# Patient Record
Sex: Female | Born: 1955
Health system: Southern US, Community
[De-identification: ages and names within clinical notes are randomized; demographics above are authoritative.]

## PROBLEM LIST (undated history)

## (undated) DIAGNOSIS — R3129 Other microscopic hematuria: Secondary | ICD-10-CM

## (undated) DIAGNOSIS — M1990 Unspecified osteoarthritis, unspecified site: Secondary | ICD-10-CM

## (undated) DIAGNOSIS — G629 Polyneuropathy, unspecified: Secondary | ICD-10-CM

## (undated) DIAGNOSIS — G5603 Carpal tunnel syndrome, bilateral upper limbs: Secondary | ICD-10-CM

## (undated) DIAGNOSIS — I8002 Phlebitis and thrombophlebitis of superficial vessels of left lower extremity: Secondary | ICD-10-CM

## (undated) DIAGNOSIS — R200 Anesthesia of skin: Secondary | ICD-10-CM

## (undated) DIAGNOSIS — T8859XA Other complications of anesthesia, initial encounter: Secondary | ICD-10-CM

## (undated) DIAGNOSIS — G4730 Sleep apnea, unspecified: Secondary | ICD-10-CM

## (undated) DIAGNOSIS — T4145XA Adverse effect of unspecified anesthetic, initial encounter: Secondary | ICD-10-CM

## (undated) DIAGNOSIS — IMO0002 Reserved for concepts with insufficient information to code with codable children: Secondary | ICD-10-CM

## (undated) DIAGNOSIS — I10 Essential (primary) hypertension: Secondary | ICD-10-CM

## (undated) DIAGNOSIS — E039 Hypothyroidism, unspecified: Secondary | ICD-10-CM

## (undated) DIAGNOSIS — Z8709 Personal history of other diseases of the respiratory system: Secondary | ICD-10-CM

## (undated) DIAGNOSIS — Z9889 Other specified postprocedural states: Secondary | ICD-10-CM

## (undated) DIAGNOSIS — R112 Nausea with vomiting, unspecified: Secondary | ICD-10-CM

## (undated) DIAGNOSIS — E878 Other disorders of electrolyte and fluid balance, not elsewhere classified: Secondary | ICD-10-CM

## (undated) DIAGNOSIS — M797 Fibromyalgia: Secondary | ICD-10-CM

## (undated) DIAGNOSIS — R42 Dizziness and giddiness: Secondary | ICD-10-CM

## (undated) DIAGNOSIS — R7303 Prediabetes: Secondary | ICD-10-CM

## (undated) DIAGNOSIS — J310 Chronic rhinitis: Secondary | ICD-10-CM

## (undated) DIAGNOSIS — E8881 Metabolic syndrome: Secondary | ICD-10-CM

## (undated) DIAGNOSIS — Z86711 Personal history of pulmonary embolism: Secondary | ICD-10-CM

## (undated) DIAGNOSIS — J45909 Unspecified asthma, uncomplicated: Secondary | ICD-10-CM

## (undated) DIAGNOSIS — N200 Calculus of kidney: Secondary | ICD-10-CM

## (undated) DIAGNOSIS — R351 Nocturia: Secondary | ICD-10-CM

## (undated) DIAGNOSIS — K219 Gastro-esophageal reflux disease without esophagitis: Secondary | ICD-10-CM

## (undated) DIAGNOSIS — R202 Paresthesia of skin: Secondary | ICD-10-CM

## (undated) DIAGNOSIS — R32 Unspecified urinary incontinence: Secondary | ICD-10-CM

## (undated) DIAGNOSIS — Z87442 Personal history of urinary calculi: Secondary | ICD-10-CM

## (undated) DIAGNOSIS — E88819 Insulin resistance, unspecified: Secondary | ICD-10-CM

## (undated) DIAGNOSIS — N3946 Mixed incontinence: Secondary | ICD-10-CM

## (undated) DIAGNOSIS — R011 Cardiac murmur, unspecified: Secondary | ICD-10-CM

## (undated) DIAGNOSIS — M25519 Pain in unspecified shoulder: Secondary | ICD-10-CM

## (undated) DIAGNOSIS — M4800 Spinal stenosis, site unspecified: Secondary | ICD-10-CM

## (undated) DIAGNOSIS — N3281 Overactive bladder: Secondary | ICD-10-CM

## (undated) DIAGNOSIS — I872 Venous insufficiency (chronic) (peripheral): Secondary | ICD-10-CM

## (undated) DIAGNOSIS — E119 Type 2 diabetes mellitus without complications: Secondary | ICD-10-CM

## (undated) DIAGNOSIS — R3915 Urgency of urination: Secondary | ICD-10-CM

## (undated) DIAGNOSIS — G4700 Insomnia, unspecified: Secondary | ICD-10-CM

## (undated) DIAGNOSIS — Z87898 Personal history of other specified conditions: Secondary | ICD-10-CM

## (undated) DIAGNOSIS — R0602 Shortness of breath: Secondary | ICD-10-CM

## (undated) DIAGNOSIS — J189 Pneumonia, unspecified organism: Secondary | ICD-10-CM

## (undated) DIAGNOSIS — E662 Morbid (severe) obesity with alveolar hypoventilation: Secondary | ICD-10-CM

## (undated) HISTORY — DX: Personal history of pulmonary embolism: Z86.711

## (undated) HISTORY — DX: Shortness of breath: R06.02

## (undated) HISTORY — DX: Overactive bladder: N32.81

## (undated) HISTORY — DX: Chronic rhinitis: J31.0

## (undated) HISTORY — PX: ENDOMETRIAL ABLATION: SHX621

## (undated) HISTORY — DX: Mixed incontinence: N39.46

## (undated) HISTORY — DX: Morbid (severe) obesity with alveolar hypoventilation: E66.2

## (undated) HISTORY — DX: Cardiac murmur, unspecified: R01.1

## (undated) HISTORY — PX: HERNIA REPAIR: SHX51

## (undated) HISTORY — DX: Polyneuropathy, unspecified: G62.9

## (undated) HISTORY — DX: Insulin resistance, unspecified: E88.819

## (undated) HISTORY — PX: EXTRACORPOREAL SHOCK WAVE LITHOTRIPSY: SHX1557

## (undated) HISTORY — DX: Spinal stenosis, site unspecified: M48.00

## (undated) HISTORY — DX: Dizziness and giddiness: R42

## (undated) HISTORY — DX: Other disorders of electrolyte and fluid balance, not elsewhere classified: E87.8

## (undated) HISTORY — DX: Morbid (severe) obesity due to excess calories: E66.01

## (undated) HISTORY — DX: Insomnia, unspecified: G47.00

## (undated) HISTORY — DX: Carpal tunnel syndrome, bilateral upper limbs: G56.03

## (undated) HISTORY — DX: Venous insufficiency (chronic) (peripheral): I87.2

## (undated) HISTORY — DX: Hypothyroidism, unspecified: E03.9

## (undated) HISTORY — DX: Other microscopic hematuria: R31.29

## (undated) HISTORY — DX: Fibromyalgia: M79.7

## (undated) HISTORY — DX: Essential (primary) hypertension: I10

## (undated) HISTORY — DX: Metabolic syndrome: E88.81

## (undated) HISTORY — DX: Calculus of kidney: N20.0

## (undated) HISTORY — PX: GASTRIC RESTRICTION SURGERY: SHX653

## (undated) HISTORY — DX: Reserved for concepts with insufficient information to code with codable children: IMO0002

## (undated) HISTORY — PX: OTHER SURGICAL HISTORY: SHX169

## (undated) HISTORY — DX: Phlebitis and thrombophlebitis of superficial vessels of left lower extremity: I80.02

## (undated) HISTORY — DX: Gastro-esophageal reflux disease without esophagitis: K21.9

## (undated) HISTORY — DX: Unspecified osteoarthritis, unspecified site: M19.90

---

## 1983-04-01 HISTORY — PX: GASTRIC RESTRICTION SURGERY: SHX653

## 1985-03-31 HISTORY — PX: CHOLECYSTECTOMY: SHX55

## 1997-12-01 ENCOUNTER — Ambulatory Visit (HOSPITAL_COMMUNITY): Admission: RE | Admit: 1997-12-01 | Discharge: 1997-12-01 | Payer: Self-pay | Admitting: Family Medicine

## 1998-01-25 ENCOUNTER — Encounter: Payer: Self-pay | Admitting: Emergency Medicine

## 1998-01-25 ENCOUNTER — Observation Stay (HOSPITAL_COMMUNITY): Admission: EM | Admit: 1998-01-25 | Discharge: 1998-01-26 | Payer: Self-pay | Admitting: Emergency Medicine

## 1998-01-25 ENCOUNTER — Encounter: Payer: Self-pay | Admitting: Urology

## 1998-01-25 ENCOUNTER — Ambulatory Visit (HOSPITAL_COMMUNITY): Admission: RE | Admit: 1998-01-25 | Discharge: 1998-01-25 | Payer: Self-pay | Admitting: Urology

## 1998-01-26 ENCOUNTER — Encounter: Payer: Self-pay | Admitting: Urology

## 1998-03-31 HISTORY — PX: FOOT SURGERY: SHX648

## 1999-02-05 ENCOUNTER — Ambulatory Visit (HOSPITAL_BASED_OUTPATIENT_CLINIC_OR_DEPARTMENT_OTHER): Admission: RE | Admit: 1999-02-05 | Discharge: 1999-02-05 | Payer: Self-pay | Admitting: Orthopedic Surgery

## 1999-04-03 ENCOUNTER — Encounter: Admission: RE | Admit: 1999-04-03 | Discharge: 1999-04-03 | Payer: Self-pay | Admitting: Gynecology

## 1999-04-03 ENCOUNTER — Encounter: Payer: Self-pay | Admitting: Gynecology

## 1999-04-10 ENCOUNTER — Encounter: Payer: Self-pay | Admitting: Gynecology

## 1999-04-10 ENCOUNTER — Encounter: Admission: RE | Admit: 1999-04-10 | Discharge: 1999-04-10 | Payer: Self-pay | Admitting: Gynecology

## 1999-12-05 ENCOUNTER — Encounter (INDEPENDENT_AMBULATORY_CARE_PROVIDER_SITE_OTHER): Payer: Self-pay | Admitting: Specialist

## 1999-12-05 ENCOUNTER — Ambulatory Visit (HOSPITAL_COMMUNITY): Admission: RE | Admit: 1999-12-05 | Discharge: 1999-12-05 | Payer: Self-pay | Admitting: Gynecology

## 2000-04-10 ENCOUNTER — Encounter: Payer: Self-pay | Admitting: Gynecology

## 2000-04-10 ENCOUNTER — Encounter: Admission: RE | Admit: 2000-04-10 | Discharge: 2000-04-10 | Payer: Self-pay | Admitting: Gynecology

## 2000-05-01 ENCOUNTER — Encounter: Admission: RE | Admit: 2000-05-01 | Discharge: 2000-05-01 | Payer: Self-pay | Admitting: Endocrinology

## 2000-05-01 ENCOUNTER — Encounter: Payer: Self-pay | Admitting: Endocrinology

## 2000-05-13 ENCOUNTER — Encounter: Admission: RE | Admit: 2000-05-13 | Discharge: 2000-05-13 | Payer: Self-pay | Admitting: Endocrinology

## 2000-05-13 ENCOUNTER — Encounter: Payer: Self-pay | Admitting: Endocrinology

## 2000-08-19 ENCOUNTER — Encounter: Admission: RE | Admit: 2000-08-19 | Discharge: 2000-08-19 | Payer: Self-pay | Admitting: Orthopedic Surgery

## 2000-08-19 ENCOUNTER — Encounter: Payer: Self-pay | Admitting: Specialist

## 2000-08-20 ENCOUNTER — Other Ambulatory Visit: Admission: RE | Admit: 2000-08-20 | Discharge: 2000-08-20 | Payer: Self-pay | Admitting: Gynecology

## 2000-10-12 ENCOUNTER — Encounter: Payer: Self-pay | Admitting: Specialist

## 2000-10-12 ENCOUNTER — Encounter: Admission: RE | Admit: 2000-10-12 | Discharge: 2000-10-12 | Payer: Self-pay | Admitting: Specialist

## 2001-04-13 ENCOUNTER — Encounter: Admission: RE | Admit: 2001-04-13 | Discharge: 2001-04-13 | Payer: Self-pay | Admitting: Gynecology

## 2001-04-13 ENCOUNTER — Encounter: Payer: Self-pay | Admitting: Gynecology

## 2001-11-01 ENCOUNTER — Encounter: Payer: Self-pay | Admitting: Specialist

## 2001-11-01 ENCOUNTER — Encounter: Admission: RE | Admit: 2001-11-01 | Discharge: 2001-11-01 | Payer: Self-pay | Admitting: Specialist

## 2001-11-15 ENCOUNTER — Encounter: Admission: RE | Admit: 2001-11-15 | Discharge: 2001-11-15 | Payer: Self-pay | Admitting: Specialist

## 2001-11-15 ENCOUNTER — Encounter: Payer: Self-pay | Admitting: Specialist

## 2007-04-01 HISTORY — PX: JOINT REPLACEMENT: SHX530

## 2007-04-01 LAB — HM MAMMOGRAPHY: HM Mammogram: NORMAL

## 2008-03-31 ENCOUNTER — Encounter: Payer: Self-pay | Admitting: Family Medicine

## 2008-03-31 LAB — CONVERTED CEMR LAB: Pap Smear: NORMAL

## 2009-07-17 ENCOUNTER — Encounter: Payer: Self-pay | Admitting: Family Medicine

## 2009-09-06 ENCOUNTER — Encounter: Payer: Self-pay | Admitting: Family Medicine

## 2010-02-19 ENCOUNTER — Encounter: Payer: Self-pay | Admitting: Family Medicine

## 2010-02-20 ENCOUNTER — Encounter: Payer: Self-pay | Admitting: Family Medicine

## 2010-03-28 ENCOUNTER — Encounter: Payer: Self-pay | Admitting: Family Medicine

## 2010-04-11 ENCOUNTER — Encounter: Payer: Self-pay | Admitting: Family Medicine

## 2010-04-12 ENCOUNTER — Encounter: Payer: Self-pay | Admitting: Family Medicine

## 2010-04-12 ENCOUNTER — Ambulatory Visit
Admission: RE | Admit: 2010-04-12 | Discharge: 2010-04-12 | Payer: Self-pay | Source: Home / Self Care | Attending: Family Medicine | Admitting: Family Medicine

## 2010-04-12 DIAGNOSIS — E8881 Metabolic syndrome: Secondary | ICD-10-CM | POA: Insufficient documentation

## 2010-04-12 DIAGNOSIS — G4733 Obstructive sleep apnea (adult) (pediatric): Secondary | ICD-10-CM | POA: Insufficient documentation

## 2010-04-12 DIAGNOSIS — R42 Dizziness and giddiness: Secondary | ICD-10-CM

## 2010-04-12 DIAGNOSIS — I10 Essential (primary) hypertension: Secondary | ICD-10-CM | POA: Insufficient documentation

## 2010-04-12 DIAGNOSIS — E039 Hypothyroidism, unspecified: Secondary | ICD-10-CM | POA: Insufficient documentation

## 2010-04-12 HISTORY — DX: Dizziness and giddiness: R42

## 2010-04-15 ENCOUNTER — Encounter: Payer: Self-pay | Admitting: Family Medicine

## 2010-04-15 ENCOUNTER — Telehealth (INDEPENDENT_AMBULATORY_CARE_PROVIDER_SITE_OTHER): Payer: Self-pay | Admitting: *Deleted

## 2010-04-16 ENCOUNTER — Ambulatory Visit: Admit: 2010-04-16 | Payer: Self-pay | Admitting: Family Medicine

## 2010-04-17 ENCOUNTER — Other Ambulatory Visit: Payer: Self-pay | Admitting: Family Medicine

## 2010-04-17 ENCOUNTER — Telehealth (INDEPENDENT_AMBULATORY_CARE_PROVIDER_SITE_OTHER): Payer: Self-pay | Admitting: *Deleted

## 2010-04-17 LAB — LIPID PANEL
Cholesterol: 206 mg/dL — ABNORMAL HIGH (ref 0–200)
HDL: 79.8 mg/dL (ref >39.00)
Total CHOL/HDL Ratio: 3
Triglycerides: 112 mg/dL (ref 0.0–149.0)
VLDL: 22.4 mg/dL (ref 0.0–40.0)

## 2010-04-17 LAB — HEMOGLOBIN A1C: Hgb A1c MFr Bld: 6.1 % (ref 4.6–6.5)

## 2010-04-17 LAB — SEDIMENTATION RATE: Sed Rate: 15 mm/hr (ref 0–22)

## 2010-04-17 LAB — LDL CHOLESTEROL, DIRECT: Direct LDL: 104.9 mg/dL

## 2010-04-18 ENCOUNTER — Telehealth (INDEPENDENT_AMBULATORY_CARE_PROVIDER_SITE_OTHER): Payer: Self-pay | Admitting: *Deleted

## 2010-04-18 LAB — BASIC METABOLIC PANEL
BUN: 16 mg/dL (ref 6–23)
CO2: 26 mEq/L (ref 19–32)
Calcium: 9.6 mg/dL (ref 8.4–10.5)
Chloride: 102 mEq/L (ref 96–112)
Creatinine, Ser: 0.6 mg/dL (ref 0.4–1.2)
GFR: 107.97 mL/min (ref >60.00)
Glucose, Bld: 103 mg/dL — ABNORMAL HIGH (ref 70–99)
Potassium: 4.2 mEq/L (ref 3.5–5.1)
Sodium: 139 mEq/L (ref 135–145)

## 2010-04-19 ENCOUNTER — Encounter: Admission: RE | Admit: 2010-04-19 | Payer: Self-pay | Source: Home / Self Care | Admitting: Family Medicine

## 2010-04-19 ENCOUNTER — Telehealth: Payer: Self-pay | Admitting: Family Medicine

## 2010-04-20 ENCOUNTER — Other Ambulatory Visit: Payer: Self-pay | Admitting: Orthopedic Surgery

## 2010-04-20 DIAGNOSIS — M545 Low back pain, unspecified: Secondary | ICD-10-CM

## 2010-04-23 ENCOUNTER — Telehealth: Payer: Self-pay | Admitting: Family Medicine

## 2010-04-30 ENCOUNTER — Telehealth: Payer: Self-pay | Admitting: Family Medicine

## 2010-04-30 ENCOUNTER — Encounter: Payer: Self-pay | Admitting: Family Medicine

## 2010-05-01 ENCOUNTER — Encounter: Payer: Self-pay | Admitting: Family Medicine

## 2010-05-02 NOTE — Miscellaneous (Signed)
  Clinical Lists Changes  Medications: Added new medication of SYNTHROID 175 MCG TABS (LEVOTHYROXINE SODIUM) 1 tab by mouth qd Added new medication of LISINOPRIL 40 MG TABS (LISINOPRIL) 1 tab by mouth qd Added new medication of ZOLPIDEM TARTRATE 10 MG TABS (ZOLPIDEM TARTRATE) 1 tab by mouth at bedtime as needed Added new medication of METFORMIN HCL 500 MG TABS (METFORMIN HCL) 1 tab by mouth bid Added new medication of HYDROCODONE-ACETAMINOPHEN 2.5-500 MG TABS (HYDROCODONE-ACETAMINOPHEN) 1 tab by mouth q6h as needed for pain Observations: Added new observation of REGULAREXERC: no (04/11/2010 16:38) Added new observation of SOCIAL HX: Former smoker. Social ETOH Regular exercise-no  (04/11/2010 16:38) Added new observation of FAMILY HX: Mother: HTN Father: CVA GPs: CAD,arthritis,thyroid disorder. (04/11/2010 16:38) Added new observation of PAST SURG HX: Cholecystectomy Foot surgery Inguinal herniorrhaphy Lithrotripsy (04/11/2010 16:38) Added new observation of PAST MED HX: DM 2, non insulin-requiring HTN Hypothyroidism (TSH 02/19/10= 1.14) Osteoarthritis GERD Insomnia Morbid obesity (BMI 49) Functional heart murmur (w/u done) (04/11/2010 16:38)       Past History:  Past Medical History: DM 2, non insulin-requiring HTN Hypothyroidism (TSH 02/19/10= 1.14) Osteoarthritis GERD Insomnia Morbid obesity (BMI 49) Functional heart murmur (w/u done)  Past Surgical History: Cholecystectomy Foot surgery Inguinal herniorrhaphy Lithrotripsy   Family History: Mother: HTN Father: CVA GPs: CAD,arthritis,thyroid disorder.  Social History: Former smoker. Social ETOH Regular exercise-no Does Patient Exercise:  no

## 2010-05-02 NOTE — Progress Notes (Signed)
Summary: Medical Records Request  Phone Note Other Incoming   Summary of Call: Pls request records from Dr. Renae Fickle (orthopedist in [place]) and from Dr. Elmon Kirschner at [facility] in [place]. [place], [place].    Initial call taken by: Michell Heinrich M.D.,  April 19, 2010 8:29 AM  Follow-up for Phone Call         Phone [phone]       Fax     [phone] Information faxed after patient signed release of medical records . Follow-up by: Georga Bora,  April 23, 2010 1:50 PM

## 2010-05-02 NOTE — Progress Notes (Signed)
Summary: Add-on labs  Phone Note Other Incoming   Summary of Call: Please add vitamin B12 level and CBC w/diff to recent labs for Tricia Perry (Dx is ataxia and weakness).  Thx. Initial call taken by: Michell Heinrich M.D.,  April 19, 2010 8:52 AM  Follow-up for Phone Call        request faxed to lab Follow-up by: Francee Piccolo CMA Duncan Dull),  April 19, 2010 9:53 AM  Additional Follow-up for Phone Call Additional follow up Details #1::        PC from Tonya at [facility], blood sample is too old to do these test. Additional Follow-up by: Francee Piccolo CMA Duncan Dull),  April 19, 2010 11:52 AM

## 2010-05-02 NOTE — Miscellaneous (Signed)
  Clinical Lists Changes  Allergies: Added new allergy or adverse reaction of CODEINE Added new allergy or adverse reaction of MORPHINE Observations: Added new observation of NKA: F (04/12/2010 11:31)

## 2010-05-02 NOTE — Medication Information (Signed)
Summary: [facility]  [facility]   Imported By: Lester [place] 04/19/2010 08:36:24  _____________________________________________________________________  External Attachment:    Type:   Image     Comment:   External Document

## 2010-05-02 NOTE — Letter (Signed)
Summary: [facility]   Imported By: Lester [place] 04/19/2010 08:44:47  _____________________________________________________________________  External Attachment:    Type:   Image     Comment:   External Document

## 2010-05-02 NOTE — Progress Notes (Signed)
Summary: Congestion medication  Phone Note Call from Patient   Summary of Call: RC from pt.  Notified of appt's that [place] has scheduled for her.  Pt also states that she picked up Mucinex over the weekend and it is not helping her.  She is still having a dry, deep, unproductive cough.  Pt would like something called in to pharmacy. I asked pt if she had been checking her sugars prior to breakfast and if so, how they looked.  Pt states she is checking her sugars and "they have leveled out".  Please adivse re: congestion meds. Initial call taken by: Francee Piccolo CMA Duncan Dull),  April 15, 2010 12:10 PM  Follow-up for Phone Call        Please communicate to her that her respiratory symptoms will still take some time to get better--perhaps another 5-7 days.  The only additional med to offer at this time is a cough med like hydrocodone.   May call in hycodan suspension, 1 tsp q6h as needed for cold/cough, 4 oz, no RF. Follow-up by: Michell Heinrich M.D.,  April 15, 2010 1:22 PM  Additional Follow-up for Phone Call Additional follow up Details #1::        LM to [facility] at home number Francee Piccolo CMA (AAMA)  April 15, 2010 3:02 PM  RC from pt.  Reviewed above information with pt.  Also asked pt several questions regarding mammo and MRI appts.  Pt would like hydrocodone syrup called in.  Advised pt she needs to have kidney function tested prior to MRI, she will go to [place] lab this week for her labs.  BUN and creatinine added. Additional Follow-up by: Francee Piccolo CMA Duncan Dull),  April 15, 2010 3:44 PM    New/Updated Medications: * HYCODAN SUSPENSION 1 tsp q6h as needed for cold/cough Prescriptions: HYCODAN SUSPENSION 1 tsp q6h as needed for cold/cough  #4 oz x o   Entered by:   Francee Piccolo CMA (AAMA)   Authorized by:   Michell Heinrich M.D.   Signed by:   Francee Piccolo CMA (AAMA) on 04/15/2010   Method used:   Faxed to ...       [facility] [address]*  (retail)       [address] [place], [place]  [address]       Ph: [phone]       Fax: [phone]   RxID:   [id number]

## 2010-05-02 NOTE — Progress Notes (Signed)
Summary: Lab results  Phone Note Other Incoming   Summary of Call: Please notify: labs normal.  Continue current meds. Initial call taken by: Michell Heinrich M.D.,  April 18, 2010 9:30 AM  Follow-up for Phone Call        LM to [facility] at home number Francee Piccolo CMA Duncan Dull)  April 18, 2010 3:29 PM  RC from pt.  Notified of above and labs have been forwarded to [facility] so she should not have any problems tomorrow w/MRI appt. Follow-up by: Francee Piccolo CMA Duncan Dull),  April 18, 2010 4:30 PM
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
## 2010-05-02 NOTE — Assessment & Plan Note (Signed)
Summary: New pt est care/dt BCBS   Vital Signs:  Patient profile:   55 year old female Height:      64.5 inches Weight:      287.75 pounds BMI:     48.81 O2 Sat:      98 % on Room air Pulse rate:   79 / minute BP sitting:   138 / 87  (right arm) Cuff size:   large  Vitals Entered By: Francee Piccolo CMA Duncan Dull) (April 12, 2010 3:47 PM)  O2 Flow:  Room air CC: establish care, productive cough, green phlegm//SP Is Patient Diabetic? Yes  Vision Screening:Left eye w/o correction: 20 / 50 Right Eye w/o correction: 20 / 50 Both eyes w/o correction:  20/ 40  Color vision testing: normal      Vision Entered By: Francee Piccolo CMA Duncan Dull) (April 12, 2010 4:50 PM)   History of Present Illness: 55 y/o WF here to establish care.   Pleasant lady but very difficult historian.  Even with rewording questions multiple ways and sometimes presenting MULTIPLE CHOICE questions to make it easier for her, she was unable to give much straightforward historical information.  Moreover, she's been getting some care at an MD near the [facility], some from specialists, and of course no helpful records are with her today except one office note and some labs from [facility] in [place].  These records mention a hx of medical noncompliance.  Main complaint is "feeling off balance". Sounds like she had an initial vertiginous episode months ago, but has since been mainly "head feels heavy" and feels like she staggers when she walks.  Mentions ENT eval, talk of MRI brain but one was never done per pt report.  Sx's of unbalance wax and wane.  She is able to drive fine. Mentions that her vision is "off" some, but is unable to verbalize what that means exactly.  No focal weakness.  Admits to decreased sensation in feet, plus abnl NCS in past.  Having headaches since onset of dizzy/balance problem, describes most of these as all-over ache/throb, without n/v.  Sounds like some of them  are mostly left side, focused in left temple.   Usually feels it when she wakes up in am, sometimes can go back to sleep and it will then go away.  No HA-specific meds in the past per pt.  DM 2 vs impaired fasting glucose per records: has been on metformin but admits she only takes it once daily most days, rarely checks glucose. Dx'd with OSA approx 8 yrs ago but has never worn the CPAP that she was rx'd.  Recent URI symptoms with coughing.  No fever or SOB.  No flu vaccine this season yet.   Has DDD L-spine, epidural steroid injections are planned for the near future (Dr. Renae Fickle is local ortho MD).  She doesn't take much hydroc/apap, occasionally takes celebrex.    Preventive Screening-Counseling & Management  Alcohol-Tobacco     Alcohol drinks/day: 0     Smoking Status: never  Current Medications (verified): 1)  Synthroid 175 Mcg Tabs (Levothyroxine Sodium) .Marland Kitchen.. 1 Tab By Mouth Qd 2)  Lisinopril 40 Mg Tabs (Lisinopril) .Marland Kitchen.. 1 Tab By Mouth Qd 3)  Zolpidem Tartrate 10 Mg Tabs (Zolpidem Tartrate) .Marland Kitchen.. 1 Tab By Mouth At Bedtime As Needed 4)  Metformin Hcl 500 Mg Tabs (Metformin Hcl) .Marland Kitchen.. 1 Tab By Mouth Bid 5)  Hydrocodone-Acetaminophen 2.5-500 Mg Tabs (Hydrocodone-Acetaminophen) .Marland Kitchen.. 1 Tab By Mouth Q6h As Needed For  Pain 6)  Premarin Tablet (Unknown Dose) .... Take 1 Tablet By Mouth Once A Day 7)  Centrum  Tabs (Multiple Vitamins-Minerals) .... Take 1 Tablet By Mouth Once A Day  Allergies (verified): 1)  ! Codeine 2)  ! Morphine  Past History:  Past Surgical History: Cholecystectomy Foot surgery Inguinal herniorrhaphy Lithrotripsy Endometrial ablation (menorrhagia)  Family History: Mother: HTN, brain cancer. Father: CVA GPs: CAD,arthritis,thyroid disorder.  Social History: Married, has adult daughter. Recently adopted 2 young girls. Former smoker. Social ETOH Regular exercise-no Smoking Status:  never  Review of Systems  The patient denies anorexia, fever, weight loss,  weight gain, decreased hearing, hoarseness, chest pain, syncope, dyspnea on exertion, peripheral edema, prolonged cough, hemoptysis, abdominal pain, melena, hematochezia, severe indigestion/heartburn, hematuria, incontinence, genital sores, muscle weakness, suspicious skin lesions, transient blindness, difficulty walking, depression, unusual weight change, abnormal bleeding, enlarged lymph nodes, angioedema, and breast masses.    Physical Exam  General:  VS: noted, all normal. Gen: Alert, well appearing, oriented x 4.  Morbidly obese-appearing.  Pleasant affect. HEENT: Scalp without lesions or hair loss.  Ears: EACs clear, normal epithelium.  TMs with good light reflex and landmarks bilaterally.  Eyes: no injection, icteris, swelling, or exudate.  EOMI, PERRLA. Nose: no drainage or turbinate edema/swelling.  No inection or focal lesion.  Mouth: lips without lesion/swelling.  Oral mucosa pink and moist.  Dentition intact and without obvious caries or gingival swelling.  Oropharynx without erythema, exudate, or swelling.  Neck: supple.  No lymphadenopathy, thyromegaly, or mass. Chest: symmetric expansion, with nonlabored respirations.  Clear and equal breath sounds in all lung fields.   CV: RRR, no m/r/g.  Peripheral pulses 2+/symmetric. ABD: soft, NT, ND, BS normal.  No hepatospenomegaly or mass.  No bruits. EXT: no clubbing, cyanosis, or edema.  Neuro: CN 2-12 intact bilaterally, strength 5/5 in proximal and distal upper extremities and lower extremities bilaterally.  No tremor.  No disdiadochokinesis.  No ataxia.  No ataxia, although she walks slowly. No sensory testing done today. Visual field testing: no deficits.     Impression & Recommendations:  Problem # 1:  DIZZINESS (ICD-780.4) Assessment New With HA's (784.0) and this ongoing vague dizziness sensation and feeling of ataxic gait, will obtain MRI brain.   Her visual acuity was abnl here today.  I recommended she make appt with  ophtho: she will pursue this and if she has any probs will call us for help. Orders: Radiology Referral (Radiology)  Problem # 2:  DIAB W/O COMP TYPE II/UNS NOT STATED UNCNTRL (ICD-250.00) Spent some time educating patient on this condition, home bg monitoring, meds.  Encouraged patient to check her fasting sugar at least 3 times per week until f/u in office with me in 2 wks.  She is to write the numbers down and bring them with her to the office visit. Her updated medication list for this problem includes:    Lisinopril 40 Mg Tabs (Lisinopril) .Marland Kitchen... 1 tab by mouth qd    Metformin Hcl 500 Mg Tabs (Metformin hcl) .Marland Kitchen... 1 tab by mouth bid  Orders: TLB-A1C / Hgb A1C (Glycohemoglobin) (83036-A1C) TLB-Lipid Panel (80061-LIPID)  Problem # 3:  OTHER SCREENING MAMMOGRAM (ICD-V76.12) Assessment: Comment Only Screening mammogram will be arranged.  Orders: Radiology Referral (Radiology)  Problem # 4:  OBSTRUCTIVE SLEEP APNEA (ICD-327.23) Assessment: Unchanged Need to get [facility] resp therapist to visit her home and help her get this going.  Problem # 5:  VIRAL URI (ICD-465.9) Assessment: New Reassured.  Recommended OTC  mucinex DM as needed, saline nasal spray as needed.  Call or return if symptoms persisting x 10d without signs of improvement.  Complete Medication List: 1)  Synthroid 175 Mcg Tabs (Levothyroxine sodium) .Marland Kitchen.. 1 tab by mouth qd 2)  Lisinopril 40 Mg Tabs (Lisinopril) .Marland Kitchen.. 1 tab by mouth qd 3)  Zolpidem Tartrate 10 Mg Tabs (Zolpidem tartrate) .Marland Kitchen.. 1 tab by mouth at bedtime as needed 4)  Metformin Hcl 500 Mg Tabs (Metformin hcl) .Marland Kitchen.. 1 tab by mouth bid 5)  Hydrocodone-acetaminophen 2.5-500 Mg Tabs (Hydrocodone-acetaminophen) .Marland Kitchen.. 1 tab by mouth q6h as needed for pain 6)  Premarin Tablet (unknown Dose)  .... Take 1 tablet by mouth once a day 7)  Centrum Tabs (Multiple vitamins-minerals) .... Take 1 tablet by mouth once a day  Other Orders: TLB-Sedimentation Rate (ESR)  (85652-ESR)  Patient Instructions: 1)  Check your sugar before breakfast 3 days per week. 2)  Get Mucinex DM over the counter for your current cold/cough. 3)  F/u in 2 weeks.   Orders Added: 1)  TLB-Sedimentation Rate (ESR) [85652-ESR] 2)  TLB-A1C / Hgb A1C (Glycohemoglobin) [83036-A1C] 3)  TLB-Lipid Panel [80061-LIPID] 4)  Radiology Referral [Radiology] 5)  New Patient Level IV [47829]     Appended Document: New pt est care/dt BCBS    Clinical Lists Changes  Observations: Added new observation of PAST MED HX: DM 2, non insulin-requiring HTN Hypothyroidism (TSH 02/19/10= 1.14) Osteoarthritis with DDD L-spine GERD Insomnia Morbid obesity (BMI 49) Functional heart murmur (w/u done) Nephrolithiasis Asthma Peripheral neuropathy (04/19/2010 12:46) Added new observation of PRIMARY MD: Nicoletta Ba, MD (04/19/2010 12:46)        Allergies: 1)  ! Codeine 2)  ! Morphine   Past History:  Past Medical History: DM 2, non insulin-requiring HTN Hypothyroidism (TSH 02/19/10= 1.14) Osteoarthritis with DDD L-spine GERD Insomnia Morbid obesity (BMI 49) Functional heart murmur (w/u done) Nephrolithiasis Asthma Peripheral neuropathy

## 2010-05-02 NOTE — Progress Notes (Signed)
Summary: [place] needs lab results  Phone Note From Other Clinic Call back at [phone]   Caller: Provider Summary of Call: SW Rinaldo Cloud at [employer], she SW pt, pt is going to have bloodwork done 04/18/10 at the [facility] lab, pls call or fax [phone] lab results to [facility] ASAP Initial call taken by: Lannette Donath,  April 17, 2010 11:22 AM  Follow-up for Phone Call        These labs are back and I printed a copy.  Will you call [place] [facility] and see exactly what lab they were interested in.  If it was creatinine, I did not get it b/c she had a creatinine in 11/2009 that was normal and has no history of kidney problems. Follow-up by: Michell Heinrich M.D.,  April 17, 2010 4:43 PM  Additional Follow-up for Phone Call Additional follow up Details #1::        BMP results faxed to [place] [facility]. Additional Follow-up by: Francee Piccolo CMA Duncan Dull),  April 18, 2010 3:37 PM

## 2010-05-02 NOTE — Progress Notes (Signed)
Summary: Rx for cough syrup  Phone Note Call from Patient   Caller: Patient Summary of Call: Patient still has a cough and congestion in chest feeling better, but would like more cough medicine    Initial call taken by: Georga Bora,  April 23, 2010 12:23 PM  Follow-up for Phone Call        Will print rx for cough med to fax to her pharmacy. Follow-up by: Michell Heinrich M.D.,  April 23, 2010 1:08 PM  Additional Follow-up for Phone Call Additional follow up Details #1::        rx faxed to [facility].  Pt aware. Additional Follow-up by: Francee Piccolo CMA (AAMA),  April 23, 2010 1:15 PM

## 2010-05-03 ENCOUNTER — Telehealth: Payer: Self-pay | Admitting: Family Medicine

## 2010-05-03 ENCOUNTER — Telehealth (INDEPENDENT_AMBULATORY_CARE_PROVIDER_SITE_OTHER): Payer: Self-pay | Admitting: *Deleted

## 2010-05-06 ENCOUNTER — Encounter: Payer: Self-pay | Admitting: Family Medicine

## 2010-05-06 LAB — CONVERTED CEMR LAB
Alkaline Phosphatase: 78 units/L (ref 39–117)
BUN: 15 mg/dL (ref 6–23)
CO2: 27 meq/L (ref 19–32)
Creatinine, Ser: 0.63 mg/dL (ref 0.40–1.20)
Eosinophils Absolute: 0.4 10*3/uL (ref 0.0–0.7)
Eosinophils Relative: 5 % (ref 0–5)
Ferritin: 21 ng/mL (ref 10–291)
Glucose, Bld: 102 mg/dL — ABNORMAL HIGH (ref 70–99)
HCT: 41.1 % (ref 36.0–46.0)
Hemoglobin: 13.2 g/dL (ref 12.0–15.0)
Lymphocytes Relative: 29 % (ref 12–46)
Lymphs Abs: 2.2 10*3/uL (ref 0.7–4.0)
MCV: 92.6 fL (ref 78.0–100.0)
Monocytes Absolute: 0.5 10*3/uL (ref 0.1–1.0)
Monocytes Relative: 7 % (ref 3–12)
Platelets: 323 10*3/uL (ref 150–400)
RBC: 4.44 M/uL (ref 3.87–5.11)
Sodium: 139 meq/L (ref 135–145)
Total Bilirubin: 0.5 mg/dL (ref 0.3–1.2)
Total Protein: 7 g/dL (ref 6.0–8.3)
Vitamin B-12: 299 pg/mL (ref 211–911)
WBC: 7.5 10*3/uL (ref 4.0–10.5)

## 2010-05-07 ENCOUNTER — Telehealth: Payer: Self-pay | Admitting: Family Medicine

## 2010-05-07 ENCOUNTER — Encounter: Payer: Self-pay | Admitting: Family Medicine

## 2010-05-07 LAB — CONVERTED CEMR LAB
Iron: 90 ug/dL (ref 42–145)
UIBC: 345 ug/dL

## 2010-05-08 NOTE — Miscellaneous (Signed)
  Clinical Lists Changes  Observations: Added new observation of PAST MED HX: DM 2, non insulin-requiring HTN Hypothyroidism (TSH 02/19/10= 1.14) Osteoarthritis (knees)  DDD and spinal stenosis L-spine (Dr. Renae Fickle evaluating as of 03/2010). GERD Insomnia Morbid obesity (BMI 49) Functional heart murmur (w/u done) Nephrolithiasis Asthma Peripheral neuropathy (04/30/2010 10:48) Added new observation of PRIMARY MD: Nicoletta Ba, MD (04/30/2010 10:48)       Past History:  Past Medical History: DM 2, non insulin-requiring HTN Hypothyroidism (TSH 02/19/10= 1.14) Osteoarthritis (knees)  DDD and spinal stenosis L-spine (Dr. Renae Fickle evaluating as of 03/2010). GERD Insomnia Morbid obesity (BMI 49) Functional heart murmur (w/u done) Nephrolithiasis Asthma Peripheral neuropathy

## 2010-05-08 NOTE — Miscellaneous (Signed)
  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 Renae Fickle evaluating as of 03/2010). GERD Insomnia Morbid obesity (BMI 49) Functional heart murmur (w/u done) Nephrolithiasis Asthma Peripheral neuropathy (04/30/2010 10:51) Added new observation of PAST SURG HX: Cholecystectomy 1987 Diaphragmatic hernia repair + gatric stapling (1980s). 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 Renae Fickle evaluating as of 03/2010). GERD Insomnia Morbid obesity (BMI 49) Functional heart murmur (w/u done) Nephrolithiasis Asthma Peripheral neuropathy  Past Surgical History: Cholecystectomy 1987 Diaphragmatic hernia repair + gatric stapling (1980s). Right total knee replacement (2009, [place]) Foot surgery (Left tarsel tunnel release 2000) Inguinal herniorrhaphy Lithrotripsy Endometrial ablation (menorrhagia)

## 2010-05-08 NOTE — Progress Notes (Signed)
----   Converted from flag ---- ---- 04/26/2010 9:12 AM, Francee Piccolo CMA (AAMA) wrote: rescheduled to 2/22 @ 5:30p  ---- 04/25/2010 3:42 PM, Elizebeth Brooking McGowen M.D. wrote: Did Aolanis ever get her brain MRI?  I have not seen any result. ------------------------------

## 2010-05-10 ENCOUNTER — Encounter: Payer: Self-pay | Admitting: Family Medicine

## 2010-05-10 ENCOUNTER — Ambulatory Visit (INDEPENDENT_AMBULATORY_CARE_PROVIDER_SITE_OTHER): Payer: Self-pay | Admitting: Family Medicine

## 2010-05-10 DIAGNOSIS — R209 Unspecified disturbances of skin sensation: Secondary | ICD-10-CM

## 2010-05-10 DIAGNOSIS — K909 Intestinal malabsorption, unspecified: Secondary | ICD-10-CM | POA: Insufficient documentation

## 2010-05-10 DIAGNOSIS — K589 Irritable bowel syndrome without diarrhea: Secondary | ICD-10-CM | POA: Insufficient documentation

## 2010-05-10 DIAGNOSIS — E119 Type 2 diabetes mellitus without complications: Secondary | ICD-10-CM

## 2010-05-10 DIAGNOSIS — Z23 Encounter for immunization: Secondary | ICD-10-CM

## 2010-05-10 DIAGNOSIS — E538 Deficiency of other specified B group vitamins: Secondary | ICD-10-CM

## 2010-05-11 ENCOUNTER — Encounter: Payer: Self-pay | Admitting: Family Medicine

## 2010-05-11 LAB — CONVERTED CEMR LAB: Microalb, Ur: 1.95 mg/dL — ABNORMAL HIGH (ref 0.00–1.89)

## 2010-05-13 ENCOUNTER — Ambulatory Visit (INDEPENDENT_AMBULATORY_CARE_PROVIDER_SITE_OTHER): Payer: BC Managed Care – PPO

## 2010-05-13 ENCOUNTER — Telehealth: Payer: Self-pay | Admitting: Family Medicine

## 2010-05-13 ENCOUNTER — Encounter: Payer: Self-pay | Admitting: Family Medicine

## 2010-05-13 DIAGNOSIS — E538 Deficiency of other specified B group vitamins: Secondary | ICD-10-CM

## 2010-05-14 ENCOUNTER — Encounter: Payer: Self-pay | Admitting: Family Medicine

## 2010-05-14 ENCOUNTER — Ambulatory Visit (INDEPENDENT_AMBULATORY_CARE_PROVIDER_SITE_OTHER): Payer: BC Managed Care – PPO

## 2010-05-14 DIAGNOSIS — E538 Deficiency of other specified B group vitamins: Secondary | ICD-10-CM

## 2010-05-15 ENCOUNTER — Encounter: Payer: Self-pay | Admitting: Family Medicine

## 2010-05-15 ENCOUNTER — Ambulatory Visit (INDEPENDENT_AMBULATORY_CARE_PROVIDER_SITE_OTHER): Payer: BC Managed Care – PPO

## 2010-05-15 DIAGNOSIS — E538 Deficiency of other specified B group vitamins: Secondary | ICD-10-CM

## 2010-05-16 ENCOUNTER — Ambulatory Visit (INDEPENDENT_AMBULATORY_CARE_PROVIDER_SITE_OTHER): Payer: BC Managed Care – PPO

## 2010-05-16 ENCOUNTER — Encounter: Payer: Self-pay | Admitting: Family Medicine

## 2010-05-16 DIAGNOSIS — E538 Deficiency of other specified B group vitamins: Secondary | ICD-10-CM

## 2010-05-16 NOTE — Progress Notes (Signed)
Summary: Hydromet syrup  Phone Note Refill Request Message from:  Fax from Pharmacy on May 03, 2010 4:22 PM  Refills Requested: Medication #1:  HYCODAN SUSPENSION 1 tsp q6h as needed for cold/cough.   Dosage confirmed as above?Dosage Confirmed Please advise refill?  Initial call taken by: Josph Macho RMA,  May 03, 2010 4:22 PM  Follow-up for Phone Call        No.  She needs to start treating her cough with over the counter meds only---robitussin DM or mucinex DM.  Also has the option of coming in for o/v. Follow-up by: Michell Heinrich M.D.,  May 06, 2010 8:45 AM  Additional Follow-up for Phone Call Additional follow up Details #1::        pt returned your phone call. home [phone] cell [phone]. please assist. Additional Follow-up by: Elba Barman,  May 06, 2010 11:11 AM    Additional Follow-up for Phone Call Additional follow up Details #2::    pt has appt on 05/10/10 Follow-up by: Francee Piccolo CMA Duncan Dull),  May 09, 2010 4:59 PM

## 2010-05-16 NOTE — Progress Notes (Signed)
Summary: Labs  ---- Converted from flag ---- ---- 05/03/2010 2:50 PM, Patirica Tomerlin wrote: I left a VM asking her to CB to make an appt - [facility]  ---- 05/02/2010 2:19 PM, Elizebeth Brooking McGowen M.D. wrote: 67.  Tell her that I want to do some additional labs on her (ask if she wants to go to [facility] or to [facility] and [facility] the appropriate orders), and have her make a follow up office visit with me sometime in the next 2 weeks.  Have her bring her glucometer and ALL medications (including vitamins/supplements/herbals/etc) in with her.  Tell her not to cancel her MRI yet.  ---- 05/02/2010 2:01 PM, Lannette Donath wrote: She went for her appt and became claustrophobic and they could not do the procedure, we also need another signed disclosure form for the pain clinic records, I actually forgot to ask her what the name of the clinic was- [facility]  ---- 05/02/2010 1:45 PM, Elizebeth Brooking McGowen M.D. wrote: Can't do MRI for what reason? (claustrophobia? scheduling conflict?)  ---- 05/02/2010 1:15 PM, Lannette Donath wrote: Left message for pt to come by office to sign a medical release form, pt did advise she is not sure if she can do the MRI, I advised her to contact [facility] so they could work with her -dt  ---- 04/30/2010 10:48 AM, Elizebeth Brooking McGowen M.D. wrote: Please call pt and tell her that an office note we got from Dr. Renae Fickle, her orthopedist, mentions that she is using '[facility]'.  Ask her where this is, what MD she sees there, or a phone/fax number for the office so we can obtain records from them.  Also, make sure she has appt to see me sometime the week after her brain MRI that is scheduled for 05/22/10. Thx ------------------------------  Phone Note Call from Patient   Summary of Call: SW pt, appt has been made for OV 05/10/10, pt advised additional lab work needs to be done, pt will go to [place] [facility], pt states never went to pain clinic, pt  would like to know why she needs additional labwork, Judeth Cornfield will call Initial call taken by: Lannette Donath,  May 03, 2010 4:15 PM  Follow-up for Phone Call        Lab orders printed--[facility]. I'm getting these labs b/c I suspect she has malabsorption problems from her previous gastric surgery and some of the symptoms she's having could be from vit B12 deficiency. Follow-up by: Michell Heinrich M.D.,  May 06, 2010 8:20 AM  Additional Follow-up for Phone Call Additional follow up Details #1::        Message left to [facility]. Orders faxed.  Francee Piccolo CMA Duncan Dull)  May 06, 2010 10:30 AM   Pt had labs drawn, has appt on 2/10.  No return call from pt.  Ending follow up. Additional Follow-up by: Francee Piccolo CMA Duncan Dull),  May 09, 2010 9:51 AM  New Problems: OTHER AND UNSPECIFIED POSTSURGICAL NONABSORPTION (ICD-579.3) DISTURBANCE OF SKIN SENSATION (ICD-782.0) ABNORMALITY OF GAIT (ICD-781.2)   New Problems: OTHER AND UNSPECIFIED POSTSURGICAL NONABSORPTION (ICD-579.3) DISTURBANCE OF SKIN SENSATION (ICD-782.0) ABNORMALITY OF GAIT (ICD-781.2)

## 2010-05-16 NOTE — Miscellaneous (Signed)
  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 Renae Fickle evaluating as of 03/2010). GERD Insomnia Morbid obesity (BMI 49) Functional heart murmur (w/u done) Nephrolithiasis Asthma Peripheral neuropathy Carpel tunnel syndrome (Bilat) ?Fibromyalgia? (05/10/2010 11:48) Added new observation of PRIMARY MD: Nicoletta Ba, MD (05/10/2010 11:48)       Allergies: 1)  ! Codeine 2)  ! 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 Renae Fickle evaluating as of 03/2010). GERD Insomnia Morbid obesity (BMI 49) Functional heart murmur (w/u done) Nephrolithiasis Asthma Peripheral neuropathy Carpel tunnel syndrome (Bilat) ?Fibromyalgia?

## 2010-05-16 NOTE — Miscellaneous (Signed)
  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 Renae Fickle evaluating as of 03/2010). 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 Renae Fickle evaluating as of 03/2010). GERD Insomnia Morbid obesity (BMI 49) Functional heart murmur (w/u done) Nephrolithiasis Asthma Peripheral neuropathy Carpel tunnel syndrome (Bilat)    Preventive Care Screening  Bone Density:    Date:  07/26/2008    Results:  normal

## 2010-05-16 NOTE — Letter (Signed)
Summary: [facility]  [facility]   Imported By: Kassie Mends 05/09/2010 09:34:10  _____________________________________________________________________  External Attachment:    Type:   Image     Comment:   External Document
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
## 2010-05-16 NOTE — Progress Notes (Signed)
Summary: Add-on Labs  Phone Note Other Incoming   Summary of Call: please add labs to blood work done recently: methylmalonic acid level, iron and TIBC.  Dx is vitamin B12 deficiency and iron deficiency.  Initial call taken by: Michell Heinrich M.D.,  May 07, 2010 9:28 AM  Follow-up for Phone Call        labs added per [facility] [place]. Follow-up by: Francee Piccolo CMA (AAMA),  May 07, 2010 10:18 AM

## 2010-05-16 NOTE — Progress Notes (Signed)
Summary: Medical records arrived Dr. Elmon Kirschner  Phone Note Other Incoming   Summary of Call: Patient medical  records arrived from Dr. Elmon Kirschner 2.7.2012 Initial call taken by: Georga Bora,  May 07, 2010 11:08 AM

## 2010-05-17 ENCOUNTER — Encounter: Payer: Self-pay | Admitting: Family Medicine

## 2010-05-17 ENCOUNTER — Ambulatory Visit (INDEPENDENT_AMBULATORY_CARE_PROVIDER_SITE_OTHER): Payer: BC Managed Care – PPO

## 2010-05-17 DIAGNOSIS — E538 Deficiency of other specified B group vitamins: Secondary | ICD-10-CM

## 2010-05-22 ENCOUNTER — Other Ambulatory Visit: Payer: Self-pay

## 2010-05-22 NOTE — Letter (Signed)
Summary: [facility]  [facility]   Imported By: Lester Gilby 05/14/2010 07:47:45  _____________________________________________________________________  External Attachment:    Type:   Image     Comment:   External Document

## 2010-05-22 NOTE — Assessment & Plan Note (Signed)
Summary: B-12  for a week  Nurse Visit   Medication Administration  Injection # 1:    Medication: Vit B12 1000 mcg    Diagnosis: B12 DEFICIENCY (ICD-266.2)    Route: IM    Site: R deltoid    Exp Date: 12/2011    Lot #: [id number]    Mfr: Sanofi Pasteur    Comments: pt will return on 05/24/10 for next injection    Patient tolerated injection without complications    Given by: Francee Piccolo CMA Duncan Dull) (May 17, 2010 2:01 PM)  Orders Added: 1)  Vit B12 1000 mcg [J3420] 2)  Admin of Therapeutic Inj  intramuscular or subcutaneous [64403]

## 2010-05-22 NOTE — Assessment & Plan Note (Signed)
Summary: B-12  for a week  Nurse Visit   Medication Administration  Injection # 1:    Medication: Vit B12 1000 mcg    Diagnosis: B12 DEFICIENCY (ICD-266.2)    Route: IM    Site: R deltoid    Exp Date: 12/2011    Lot #: [id number]    Mfr: Sanofi Pasteur    Comments: pt will return tomorrow for next injection    Patient tolerated injection without complications    Given by: Francee Piccolo CMA (AAMA) (May 15, 2010 1:28 PM)  Orders Added: 1)  Vit B12 1000 mcg [J3420] 2)  Admin of Therapeutic Inj  intramuscular or subcutaneous [16109]

## 2010-05-22 NOTE — Assessment & Plan Note (Signed)
Summary: B-12  for a week  Nurse Visit   Medication Administration  Injection # 1:    Medication: Vit B12 1000 mcg    Diagnosis: B12 DEFICIENCY (ICD-266.2)    Route: IM    Site: L deltoid    Exp Date: 12/2011    Lot #: [id number]    Mfr: Sanofi Pasteur    Comments: pt will return tomorrow for next injection    Patient tolerated injection without complications    Given by: Francee Piccolo CMA (AAMA) (May 14, 2010 1:20 PM)  Orders Added: 1)  Vit B12 1000 mcg [J3420] 2)  Admin of Therapeutic Inj  intramuscular or subcutaneous [16109]

## 2010-05-22 NOTE — Progress Notes (Signed)
Summary: [facility]-Lab results/med refill  Phone Note Other Incoming   Summary of Call: Pls let her know when she comes in for her B12 shot today that her urine protein check was BORDERLINE high.  No biggie at this point.  I just want to repeat this urine test in a few months. Initial call taken by: Michell Heinrich M.D.,  May 13, 2010 8:41 AM  Follow-up for Phone Call        pt notified of above at nurse visit.  pt is also requesting refill on Ambien, to be faxed to [facility] Follow-up by: Francee Piccolo CMA Duncan Dull),  May 13, 2010 1:20 PM  Additional Follow-up for Phone Call Additional follow up Details #1::        Rx printed and ready for fax. Additional Follow-up by: Michell Heinrich M.D.,  May 13, 2010 2:11 PM    Additional Follow-up for Phone Call Additional follow up Details #2::    rx faxed. Follow-up by: Francee Piccolo CMA Duncan Dull),  May 13, 2010 3:26 PM  Prescriptions: ZOLPIDEM TARTRATE 10 MG TABS (ZOLPIDEM TARTRATE) 1 tab by mouth at bedtime as needed  #30 x 3   Entered and Authorized by:   Michell Heinrich M.D.   Signed by:   Michell Heinrich M.D. on 05/13/2010   Method used:   Print then Give to Patient   RxID:   [id number]

## 2010-05-22 NOTE — Assessment & Plan Note (Signed)
Summary: B-12  for a week  Nurse Visit   Medication Administration  Injection # 1:    Medication: Vit B12 1000 mcg    Diagnosis: B12 DEFICIENCY (ICD-266.2)    Route: IM    Site: L deltoid    Exp Date: 12/2011    Lot #: [id number]    Mfr: Sanofi Pasteur    Comments: pt will return on 2/17 for next injection    Patient tolerated injection without complications    Given by: Francee Piccolo CMA (AAMA) (May 16, 2010 1:11 PM)  Orders Added: 1)  Vit B12 1000 mcg [J3420] 2)  Admin of Therapeutic Inj  intramuscular or subcutaneous [25956]

## 2010-05-22 NOTE — Assessment & Plan Note (Signed)
Summary: B-12  for a week  Nurse Visit   Medication Administration  Injection # 1:    Medication: Vit B12 1000 mcg    Diagnosis: B12 DEFICIENCY (ICD-266.2)    Route: IM    Site: R deltoid    Exp Date: 12/2011    Lot #: [id number]    Mfr: Sanofi Pasteur    Comments: pt will return tomorrow for next injection.    Patient tolerated injection without complications    Given by: Francee Piccolo CMA Duncan Dull) (May 13, 2010 1:18 PM)  Orders Added: 1)  Vit B12 1000 mcg [J3420] 2)  Admin of Therapeutic Inj  intramuscular or subcutaneous [60454]

## 2010-05-23 ENCOUNTER — Encounter: Payer: Self-pay | Admitting: Family Medicine

## 2010-05-23 ENCOUNTER — Ambulatory Visit (INDEPENDENT_AMBULATORY_CARE_PROVIDER_SITE_OTHER): Payer: BC Managed Care – PPO

## 2010-05-23 DIAGNOSIS — E538 Deficiency of other specified B group vitamins: Secondary | ICD-10-CM

## 2010-05-24 ENCOUNTER — Ambulatory Visit: Payer: BC Managed Care – PPO

## 2010-05-28 NOTE — Assessment & Plan Note (Addendum)
Summary: Weekly B12  Nurse Visit   Medication Administration  Injection # 1:    Medication: Vit B12 1000 mcg    Diagnosis: B12 DEFICIENCY (ICD-266.2)    Route: IM    Site: L deltoid    Exp Date: 12/2011    Lot #: [id number]    Mfr: Sanofi Pasteur    Comments: pt will return on 05/31/10 for next injection    Patient tolerated injection without complications    Given by: Francee Piccolo CMA Duncan Dull) (May 23, 2010 3:34 PM)  Orders Added: 1)  Vit B12 1000 mcg [J3420] 2)  Admin of Therapeutic Inj  intramuscular or subcutaneous [16109]

## 2010-05-31 ENCOUNTER — Encounter: Payer: Self-pay | Admitting: Family Medicine

## 2010-05-31 ENCOUNTER — Ambulatory Visit (INDEPENDENT_AMBULATORY_CARE_PROVIDER_SITE_OTHER): Payer: BC Managed Care – PPO

## 2010-05-31 DIAGNOSIS — E538 Deficiency of other specified B group vitamins: Secondary | ICD-10-CM

## 2010-06-06 ENCOUNTER — Encounter: Payer: Self-pay | Admitting: Family Medicine

## 2010-06-06 NOTE — Assessment & Plan Note (Addendum)
Summary: Weekly B-12  Nurse Visit  Medication Administration  Injection # 1:    Medication: Vit B12 1000 mcg    Diagnosis: B12 DEFICIENCY (ICD-266.2)    Route: IM    Site: R deltoid    Exp Date: 12/2011    Lot #: [id number]    Mfr: [employer]    Comments: Pt will return on 06-07-10 for next injection    Patient tolerated injection without complications    Given by: Francee Piccolo CMA Duncan Dull) (May 31, 2010 1:03 PM)  Orders Added: 1)  Vit B12 1000 mcg [J3420] 2)  Admin of Therapeutic Inj  intramuscular or subcutaneous [02542]

## 2010-06-07 ENCOUNTER — Encounter: Payer: Self-pay | Admitting: Family Medicine

## 2010-06-07 ENCOUNTER — Ambulatory Visit (INDEPENDENT_AMBULATORY_CARE_PROVIDER_SITE_OTHER): Payer: BC Managed Care – PPO

## 2010-06-07 ENCOUNTER — Ambulatory Visit: Payer: BC Managed Care – PPO

## 2010-06-07 DIAGNOSIS — E538 Deficiency of other specified B group vitamins: Secondary | ICD-10-CM

## 2010-06-11 NOTE — Assessment & Plan Note (Signed)
Summary: B-12 INJECTION  Nurse Visit   Allergies: 1)  ! Codeine 2)  ! Morphine  Medication Administration  Injection # 1:    Medication: Vit B12 1000 mcg    Diagnosis: B12 DEFICIENCY (ICD-266.2)    Route: IM    Site: L deltoid    Exp Date: 12/30/2011    Lot #: [id number]    Mfr: [employer]  Orders Added: 1)  Vit B12 1000 mcg [J3420] 2)  Admin of Therapeutic Inj  intramuscular or subcutaneous [16109]

## 2010-06-14 ENCOUNTER — Encounter: Payer: Self-pay | Admitting: Family Medicine

## 2010-06-14 ENCOUNTER — Ambulatory Visit (INDEPENDENT_AMBULATORY_CARE_PROVIDER_SITE_OTHER): Payer: BC Managed Care – PPO

## 2010-06-14 DIAGNOSIS — E538 Deficiency of other specified B group vitamins: Secondary | ICD-10-CM

## 2010-06-18 NOTE — Assessment & Plan Note (Signed)
Summary: B-12 INJECTION  Nurse Visit   Allergies: 1)  ! Codeine 2)  ! Morphine  Medication Administration  Injection # 1:    Medication: Vit B12 1000 mcg    Diagnosis: B12 DEFICIENCY (ICD-266.2)    Route: IM    Site: R deltoid    Exp Date: 11/13    Lot #: [id number]    Mfr: [employer]    Patient tolerated injection without complications    Given by: Josph Macho RMA (June 14, 2010 1:33 PM)  Orders Added: 1)  Vit B12 1000 mcg [J3420] 2)  Admin of Therapeutic Inj  intramuscular or subcutaneous [16109]

## 2010-06-20 ENCOUNTER — Encounter: Payer: Self-pay | Admitting: Family Medicine

## 2010-06-20 ENCOUNTER — Telehealth: Payer: Self-pay | Admitting: Family Medicine

## 2010-06-20 MED ORDER — ESTROGENS CONJUGATED 0.625 MG PO TABS: 0.6250 mg | ORAL_TABLET | Freq: Every day | ORAL | Status: DC

## 2010-06-20 NOTE — Telephone Encounter (Signed)
Pt requesting refill of Premarin .625 1 a day from [facility] [phone]

## 2010-06-21 ENCOUNTER — Ambulatory Visit: Payer: BC Managed Care – PPO

## 2010-06-21 ENCOUNTER — Ambulatory Visit (INDEPENDENT_AMBULATORY_CARE_PROVIDER_SITE_OTHER): Payer: BC Managed Care – PPO | Admitting: Family Medicine

## 2010-06-21 ENCOUNTER — Other Ambulatory Visit: Payer: Self-pay | Admitting: Family Medicine

## 2010-06-21 ENCOUNTER — Encounter: Payer: Self-pay | Admitting: Family Medicine

## 2010-06-21 VITALS — BP 128/82 | HR 74 | Temp 98.0°F | Ht 64.5 in | Wt 284.8 lb

## 2010-06-21 DIAGNOSIS — G5603 Carpal tunnel syndrome, bilateral upper limbs: Secondary | ICD-10-CM

## 2010-06-21 DIAGNOSIS — E119 Type 2 diabetes mellitus without complications: Secondary | ICD-10-CM

## 2010-06-21 DIAGNOSIS — I872 Venous insufficiency (chronic) (peripheral): Secondary | ICD-10-CM

## 2010-06-21 DIAGNOSIS — G629 Polyneuropathy, unspecified: Secondary | ICD-10-CM

## 2010-06-21 DIAGNOSIS — M1990 Unspecified osteoarthritis, unspecified site: Secondary | ICD-10-CM

## 2010-06-21 DIAGNOSIS — E039 Hypothyroidism, unspecified: Secondary | ICD-10-CM

## 2010-06-21 DIAGNOSIS — E538 Deficiency of other specified B group vitamins: Secondary | ICD-10-CM

## 2010-06-21 DIAGNOSIS — G5600 Carpal tunnel syndrome, unspecified upper limb: Secondary | ICD-10-CM

## 2010-06-21 DIAGNOSIS — R269 Unspecified abnormalities of gait and mobility: Secondary | ICD-10-CM

## 2010-06-21 DIAGNOSIS — G609 Hereditary and idiopathic neuropathy, unspecified: Secondary | ICD-10-CM

## 2010-06-21 DIAGNOSIS — I10 Essential (primary) hypertension: Secondary | ICD-10-CM

## 2010-06-21 HISTORY — DX: Carpal tunnel syndrome, bilateral upper limbs: G56.03

## 2010-06-21 MED ORDER — CYANOCOBALAMIN 1000 MCG/ML IJ SOLN
1000.0000 ug | Freq: Once | INTRAMUSCULAR | Status: AC
  Administered 2010-06-21: 1000 ug via INTRAMUSCULAR

## 2010-06-21 MED ORDER — V-2 HIGH COMPRESSION HOSE MISC: Status: DC

## 2010-06-21 MED ORDER — OXYCODONE HCL 5 MG PO TABS: ORAL_TABLET | ORAL | Status: DC

## 2010-06-21 NOTE — Assessment & Plan Note (Signed)
She has some symptoms suggestive of raynaud's as well.  I encouraged her to  Wear her wrist splints every night.

## 2010-06-21 NOTE — Assessment & Plan Note (Signed)
Mildly improved since vit B12 replacement.  Pt states HA's are a bit improved as well. She was unable to tolerate MRI brain, ordered to exclude CNS mass, etc, as cause of this, but I feel this is not really indicated at this time.  I think her entire ataxia problem is secondary to peripheral neuropathy.

## 2010-06-21 NOTE — Assessment & Plan Note (Signed)
Check HbA1c today.  Problem stable.  Continue current medications.  We have reviewed our general long term plan for this problem and also reviewed symptoms and signs that should prompt the patient to call or return to the office.

## 2010-06-21 NOTE — Assessment & Plan Note (Signed)
Hopefully pain mgmt MD can assist her with this when she sees him/her in the next month or so for chonic pain mgmt consult.  Continue vit b12 IM qmonth.

## 2010-06-21 NOTE — Assessment & Plan Note (Signed)
Encouraged low sodium diet, elevation of legs prn, and I rx'd some new compression hose for her (knee high).

## 2010-06-21 NOTE — Assessment & Plan Note (Signed)
Problem stable.  Continue current medications. Check TSH today.  We have reviewed our general long term plan for this problem and also reviewed symptoms and signs that should prompt the patient to call or return to the office.

## 2010-06-21 NOTE — Progress Notes (Signed)
Subjective:    Patient ID: Tricia Perry, female    DOB: 07-10-1955, 55 y.o.   MRN: [id number]  HPI 55 y/o WF here for f/u of vit b12 def, hypothyroidism, dm 2, peripheral neuropathy, ataxia. She feels mildly improved since vit b12 load: energy level better, walking feels better, headaches less frequent. Has checked her glucose in AM some lately, 105-124.  Still not compliant with postprandial monitoring. Compliant with meds but not diet.  She is pretty sedentary due to her chronic knee and feet pain (knees are constantly ache, worse with wt bearing, feet burn and tingle and have diminished sensation from knees to toes).  Says Dr. Renae Fickle, orthopedist, has referred her to a pain mgmt MD near here, ?Dr. Marilynne Drivers?  Has appt in a few weeks per pt report today.  Asks for some pain meds, says celebrex 200mg  qd prn helps some with low back but not her knees.  Says oxycodone has helped better than hydrocodone ---has tolerated both without allergy/side effect. Also c/o long history of hand tingling/numbness, esp in sleeping hours and upon awakening in am, better when she wears her wrist splints.  However, she also describes some periods of discoloration of nails ("blue") and pallor of hands with a similar loss of sensation during daytime hours.     Review of Systems  Constitutional: Negative for fever and fatigue.  HENT: Negative for congestion and sore throat.   Eyes: Negative for visual disturbance.  Respiratory: Negative for cough, chest tightness and wheezing.   Cardiovascular: Positive for leg swelling (both). Negative for chest pain.  Gastrointestinal: Negative for nausea and abdominal pain.  Genitourinary: Negative for dysuria.  Musculoskeletal: Negative for back pain and joint swelling.  Skin: Negative for rash.  Neurological: Positive for numbness (hands at night and in am after sleep c/w CTS.). Negative for dizziness, tremors and weakness.  Hematological: Negative for adenopathy.       Objective:   Physical Exam VS: noted, all normal. Gen: Alert, well appearing, oriented x 4. Chest: symmetric expansion, nonlabored respirations.  Clear and equal breath sounds in all lung fields.   CV: RRR, no m/r/g.  Peripheral pulses 2+ and symmetric. EXT: 2 + pitting edema bilat from mid tibia level down into feet bilaterally.  Minimal venous stasis skin changes.  No tenderness.  No cyanosis or erythema.   +Phalen's testing.  Decreased fine touch sensation in LEs from knees to toes.       Assessment & Plan:  Peripheral neuropathy Hopefully pain mgmt MD can assist her with this when she sees him/her in the next month or so for chonic pain mgmt consult.  Continue vit b12 IM qmonth.  HYPOTHYROIDISM Problem stable.  Continue current medications. Check TSH today.  We have reviewed our general long term plan for this problem and also reviewed symptoms and signs that should prompt the patient to call or return to the office.   DIAB W/O COMP TYPE II/UNS NOT STATED UNCNTRL Check HbA1c today.  Problem stable.  Continue current medications.  We have reviewed our general long term plan for this problem and also reviewed symptoms and signs that should prompt the patient to call or return to the office.   B12 DEFICIENCY Mild symptomatic improvement. She is s/p loading of B12 and we'll now start giving IM once monthly after today's dose.   Check vit B12 level today.  ABNORMALITY OF GAIT Mildly improved since vit B12 replacement.  Pt states HA's are a bit  improved as well. She was unable to tolerate MRI brain, ordered to exclude CNS mass, etc, as cause of this, but I feel this is not really indicated at this time.  I think her entire ataxia problem is secondary to peripheral neuropathy.  Carpal tunnel syndrome on both sides She has some symptoms suggestive of raynaud's as well.  I encouraged her to  Wear her wrist splints every night.  Chronic venous  insufficiency Encouraged low sodium diet, elevation of legs prn, and I rx'd some new compression hose for her (knee high).

## 2010-06-21 NOTE — Assessment & Plan Note (Signed)
Mild symptomatic improvement. She is s/p loading of B12 and we'll now start giving IM once monthly after today's dose.   Check vit B12 level today.

## 2010-06-21 NOTE — Patient Instructions (Signed)
Venous Stasis & Chronic Venous Insufficiency As people age, the veins located in their legs may weaken and stretch. When veins weaken and lose the ability to pump blood effectively, the condition is called chronic venous insufficiency (CVI) or venous stasis.  Almost all veins return blood back to the heart. This happens by:  The force of the heart pumping fresh blood pushes blood back to the heart.   Blood flowing to the heart from the force of gravity.  In the deep veins of the legs, blood has to fight gravity and flow upstream back to the heart. Here, the leg muscles contract to pump blood back toward the heart.  Vein walls are elastic, and many veins have small valves that only allow blood to flow in one direction. When leg muscles contract, they push inward against the elastic vein walls. This squeezes blood upward, opens the valves, and moves blood toward the heart. When leg muscles relax, the vein wall also relaxes and the valves inside the vein close to prevent blood from flowing backward. This method of pumping blood out of the legs is called the venous pump. CAUSES The venous pump works best while walking and leg muscles are contracting. But when a person sits or stands, blood pressure in leg veins can build. Deep veins are usually able to withstand short periods of inactivity, but long periods of inactivity (and increased pressure) can stretch, weaken, and damage vein walls.  High blood pressure can also stretch and damage vein walls. The veins may no longer be able to pump blood back to the heart. Venous hypertension (high blood pressure inside veins) that lasts over time is a primary cause of CVI. CVI can also be caused by:   Deep vein thrombosis, a condition where a thrombus (blood clot) blocks blood flow in a vein.   Phlebitis, an inflammation of a superficial vein that causes a blood clot to form.  Other risk factors for CVI may include:   Heredity.   Obesity.   Pregnancy.    Sedentary lifestyle.   Smoking.   Jobs requiring long periods of standing or sitting in one place.   Age and gender:   Women in their 44's and 50's and men in their 62's are more prone to developing CVI.  SYMPTOMS Symptoms of CVI may include:   Varicose veins.   Ulceration or skin breakdown.   Lipodermatosclerosis, a condition that affects the skin just above the ankle, usually on the inside surface. Over time the skin becomes brown, smooth, tight and often painful. Those with this condition have a high risk of developing skin ulcers.   Reddened or discolored skin on the leg.   Swelling.  DIAGNOSIS Your caregiver can diagnose CVI after performing a careful medical history and physical examination. To confirm the diagnosis, the following tests may also be ordered:   Duplex ultrasound.   Plethysmography (tests blood flow).   Venograms (x-ray using a special dye).  TREATMENT The goals of treatment for CVI are to restore a person to an active life and to minimize pain or disability. Typically, CVI does not pose a serious threat to life or limb, and with proper treatment most people with this condition can continue to lead active lives. In most cases, mild CVI can be treated on an outpatient basis with simple procedures. Treatment methods include:   Elastic compression socks.   Sclerotherapy, a procedure involving an injection of a material that "dissolves" the damaged veins. Other veins in the network  of blood vessels take over the function of the damaged veins.   Vein stripping (an older procedure less commonly used).   Laser Ablation surgery.   Valve repair.  HOME CARE INSTRUCTIONS  Elastic compression socks must be worn every day. They can help with symptoms and lower the chances of the problem getting worse, but they do not cure the problem.   Only take over-the-counter or prescription medicines for pain, discomfort, or fever as directed by your caregiver.   Your  caregiver will review your other medications with you.  SEEK MEDICAL CARE IF:  You are confused about how to take your medications.   There is redness, swelling, or increasing pain in the affected area.   There is a red streak or line that extends up or down from the affected area.   There is a breakdown or loss of skin in the affected area, even if the breakdown is small.   You develop an unexplained oral temperature above 100.5.   There is an injury to the affected area.  SEEK IMMEDIATE MEDICAL CARE IF:  There is an injury and open wound to the affected area.   Pain is not adequately relieved with pain medication prescribed or becomes severe.   An oral temperature above 100.5 develops.   The foot/ankle below the affected area becomes suddenly numb or the area feels weak and hard to move.  MAKE SURE YOU:   Understand these instructions.   Will watch your condition.   Will get help right away if you are not doing well or get worse.  Document Released: 07/21/2006 Document Re-Released: 02/28/2008 [facility] Patient Information 2011 [place], [place].

## 2010-06-22 LAB — COMPLETE METABOLIC PANEL WITH GFR
AST: 16 U/L (ref 0–37)
Albumin: 4.4 g/dL (ref 3.5–5.2)
Alkaline Phosphatase: 75 U/L (ref 39–117)
Calcium: 9.8 mg/dL (ref 8.4–10.5)
Chloride: 102 mEq/L (ref 96–112)
Glucose, Bld: 96 mg/dL (ref 70–99)
Potassium: 4.1 mEq/L (ref 3.5–5.3)
Sodium: 137 mEq/L (ref 135–145)
Total Protein: 6.9 g/dL (ref 6.0–8.3)

## 2010-06-22 LAB — CBC WITH DIFFERENTIAL/PLATELET
Basophils Absolute: 0 10*3/uL (ref 0.0–0.1)
Basophils Relative: 0 % (ref 0–1)
Eosinophils Absolute: 0.3 10*3/uL (ref 0.0–0.7)
Eosinophils Relative: 4 % (ref 0–5)
HCT: 39 % (ref 36.0–46.0)
MCHC: 33.6 g/dL (ref 30.0–36.0)
MCV: 91.1 fL (ref 78.0–100.0)
Monocytes Absolute: 0.5 10*3/uL (ref 0.1–1.0)
Platelets: 312 10*3/uL (ref 150–400)
RDW: 13.3 % (ref 11.5–15.5)

## 2010-06-24 ENCOUNTER — Telehealth: Payer: Self-pay | Admitting: Family Medicine

## 2010-06-24 NOTE — Telephone Encounter (Signed)
Notified of lab results.  Pt transferred to [facility] to make appts for monthly B12 injections.

## 2010-06-24 NOTE — Telephone Encounter (Signed)
Please notify: all labs came back normal.  

## 2010-06-26 ENCOUNTER — Telehealth: Payer: Self-pay | Admitting: *Deleted

## 2010-06-26 NOTE — Telephone Encounter (Signed)
Note created in error.

## 2010-06-27 ENCOUNTER — Telehealth: Payer: Self-pay | Admitting: Family Medicine

## 2010-06-27 NOTE — Telephone Encounter (Signed)
Pt returned your call, please contact patient

## 2010-06-27 NOTE — Progress Notes (Signed)
Pt informed

## 2010-06-28 ENCOUNTER — Ambulatory Visit: Payer: BC Managed Care – PPO

## 2010-07-05 ENCOUNTER — Ambulatory Visit: Payer: BC Managed Care – PPO

## 2010-07-08 ENCOUNTER — Telehealth: Payer: Self-pay | Admitting: *Deleted

## 2010-07-08 DIAGNOSIS — M1990 Unspecified osteoarthritis, unspecified site: Secondary | ICD-10-CM

## 2010-07-08 NOTE — Telephone Encounter (Signed)
Pt states that Celebrex helps her back, but is not helping her legs.  Tricia Perry states that Oxycodone helps her legs, but she has to take two of the 5mg  tablets.  Pt would like oxycodone.  Please advise.

## 2010-07-09 ENCOUNTER — Emergency Department (HOSPITAL_BASED_OUTPATIENT_CLINIC_OR_DEPARTMENT_OTHER)
Admission: EM | Admit: 2010-07-09 | Discharge: 2010-07-09 | Disposition: A | Discharge status: Home / Self Care | Payer: BC Managed Care – PPO | Attending: Emergency Medicine | Admitting: Emergency Medicine

## 2010-07-09 DIAGNOSIS — X58XXXA Exposure to other specified factors, initial encounter: Secondary | ICD-10-CM | POA: Insufficient documentation

## 2010-07-09 DIAGNOSIS — E119 Type 2 diabetes mellitus without complications: Secondary | ICD-10-CM | POA: Insufficient documentation

## 2010-07-09 DIAGNOSIS — E039 Hypothyroidism, unspecified: Secondary | ICD-10-CM | POA: Insufficient documentation

## 2010-07-09 DIAGNOSIS — Y92009 Unspecified place in unspecified non-institutional (private) residence as the place of occurrence of the external cause: Secondary | ICD-10-CM | POA: Insufficient documentation

## 2010-07-09 DIAGNOSIS — G8929 Other chronic pain: Secondary | ICD-10-CM | POA: Insufficient documentation

## 2010-07-09 DIAGNOSIS — S81009A Unspecified open wound, unspecified knee, initial encounter: Secondary | ICD-10-CM | POA: Insufficient documentation

## 2010-07-09 DIAGNOSIS — Z8739 Personal history of other diseases of the musculoskeletal system and connective tissue: Secondary | ICD-10-CM | POA: Insufficient documentation

## 2010-07-09 DIAGNOSIS — I10 Essential (primary) hypertension: Secondary | ICD-10-CM | POA: Insufficient documentation

## 2010-07-09 DIAGNOSIS — S91009A Unspecified open wound, unspecified ankle, initial encounter: Secondary | ICD-10-CM | POA: Insufficient documentation

## 2010-07-09 MED ORDER — OXYCODONE HCL 5 MG PO TABS: ORAL_TABLET | ORAL | Status: DC

## 2010-07-09 NOTE — Telephone Encounter (Signed)
RC from pt. Advised rx is ready to be picked up.  Pt states she was seen in ER earlier this AM.  She had a "busted blood vessel" on her bad leg.  Pressure was applied and bleeding was stopped. Area was wrapped and pt released home.  I was unable to ask about Dr. Oneal Grout appt.  I will ask pt when she comes to pick up rx.

## 2010-07-09 NOTE — Telephone Encounter (Signed)
Pt states she had appt with Dr. Oneal Grout on 07/08/10.  Dr. Oneal Grout recommends MRI.  Pt has put off this procedure due to fears of claustrophobia.  Dr. Oneal Grout will not rx any pain meds until pt has had MRI.

## 2010-07-09 NOTE — Telephone Encounter (Signed)
I'll write rx for #30 oxycodone and this must last her until her initial pain management clinic appt later this month.  Please confirm with her the date of this pain clinic appt (Dr. Oneal Grout in [place]).  Thx.

## 2010-07-09 NOTE — Telephone Encounter (Signed)
I have attempted to contact this patient by phone with the following results: left message to return my call on answering machine.

## 2010-07-11 ENCOUNTER — Telehealth: Payer: Self-pay | Admitting: Family Medicine

## 2010-07-11 ENCOUNTER — Ambulatory Visit (INDEPENDENT_AMBULATORY_CARE_PROVIDER_SITE_OTHER): Payer: BC Managed Care – PPO | Admitting: Family Medicine

## 2010-07-11 ENCOUNTER — Encounter: Payer: Self-pay | Admitting: Family Medicine

## 2010-07-11 VITALS — BP 124/52 | HR 64 | Ht 64.5 in | Wt 282.0 lb

## 2010-07-11 DIAGNOSIS — I872 Venous insufficiency (chronic) (peripheral): Secondary | ICD-10-CM

## 2010-07-11 DIAGNOSIS — G8929 Other chronic pain: Secondary | ICD-10-CM | POA: Insufficient documentation

## 2010-07-11 MED ORDER — FLUTICASONE PROPIONATE 0.005 % EX OINT: TOPICAL_OINTMENT | CUTANEOUS | Status: DC

## 2010-07-11 MED ORDER — FUROSEMIDE 40 MG PO TABS: ORAL_TABLET | ORAL | Status: DC

## 2010-07-11 MED ORDER — B-4 MED COMPRESSION HOSE MENS MISC: Status: DC

## 2010-07-11 MED ORDER — ALPRAZOLAM 1 MG PO TABS: ORAL_TABLET | ORAL | Status: DC

## 2010-07-11 NOTE — Assessment & Plan Note (Signed)
Some of her spider veins are so superficial that mild abrasion of the skin causes rupture. I have counseled her again on low sodium diet, elevation of legs prn, and I added lasix 40mg  qd today.  I'll see if we can find a pharmacy to do ted hose fitting for her.  She'll cover the two small brittle areas on left ankle for now, esp while sleeping. I advised her to keep the appt she made for herself at [facility] in [place] in May. Check BMET in 1 wk.

## 2010-07-11 NOTE — Assessment & Plan Note (Signed)
Severe claustrophobia has prevented her Pain Mgmt MD's necessary workup.  No pain meds unless this test is completed per her orders. She agreed to try the MRI of her L/S spine again with premedication with xanax 1mg .  I'll contact Dr. Oneal Grout, her pain management MD about this.

## 2010-07-11 NOTE — Telephone Encounter (Signed)
Can you put Mrs. Bruhl on the lab schedule for the same time she comes in on 07/26/10 for her B-12 injection? I will put a future order in for BMET.  Thx.

## 2010-07-11 NOTE — Progress Notes (Signed)
OFFICE NOTE  07/11/2010  CC:  Chief Complaint  Patient presents with  . Leg Pain    stinging since vein "busted" earlier in week     HPI:   Patient is a 55 y.o. Caucasian female who is here for f/u of LE venous insufficiency with varicosities. A spider vein busted 2d ago and the bleeding prompted anxiety and an ED visit.  She has the area covered with band aid and is fine now.  She has some moderate itching of both lower legs, esp around the larger varicosities.  She has made herself an appointment with [facility] in [place], but it is not until late May.  She tried to call a different vascular clinic to get in earlier and they told her to see me first.   She has not had any luck getting any Ted hose she can tolerate.  She has not been custom fit for these.  She is not good with leg elevation, nor does she exhibit much knowledge of how to eat a low sodium diet.  She went to Dr. Marilynne Drivers at [facility] clinic and an MRI of her L/S spine was ordered.  Pt says she "chickened out" when she got there due to claustrophobia.  Dr. Oneal Grout will not rx her opioids without some objective documentation of a problem causing her chronic pain.  Ceylin has not provided the necessary info to get specific old records (MRI's, etc), plus per her report these were all done years ago. I recently rx'd her oxycodone short term until we could get things straightened out regarding MRI, pain clinic, etc.  Past Medical History  Diagnosis Date  . Insulin resistance   . Hypertension   . Thyroid disease     Hypo; TSH 02/19/10=1.14  . 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 DDD (degenerative disc disease)     Dr. Renae Fickle evaluating  . GERD (gastroesophageal reflux disease)   . Insomnia   . Obesities, morbid     BMI 49.  Gastric stapling surgery in distant past.  . Heart murmur     functional, w/u done  . Asthma   . Peripheral neuropathy   . Carpal tunnel syndrome      bilateral  . Fibromyalgia     questionable  . Nephrolithiasis    Past Surgical History  Procedure Date  . Cholecystectomy 1987  . Joint replacement 2009    Right Knee, [facility]  . Foot surgery 2000    Left tarsel tunnel release  . Hernia repair  1980's    Diaphragmatic + gastric stapling  . Hernia repair     Inguinal  . Extracorporeal shock wave lithotripsy   . Endometrial ablation     menorrhagia     MEDS;   Outpatient Prescriptions Prior to Visit  Medication Sig Dispense Refill  . Ascorbic Acid (VITAMIN C) 500 MG tablet Take 1-2 tablets by mouth once daily      . Calcium Citrate-Vitamin D (CITRACAL PETITES/VITAMIN D) 200-250 MG-UNIT TABS Take 1 tablet by mouth daily.        . cyanocobalamin (,VITAMIN B-12,) 1000 MCG/ML injection Inject 1,000 mcg into the muscle every 30 (thirty) days.        . Elastic Bandages & Supports (V-2 HIGH COMPRESSION HOSE) MISC 1-2 tabs po q4-6h as needed for pain  30 each  0  . estrogens, conjugated, (PREMARIN) 0.625 MG tablet Take 1 tablet (0.625 mg total) by  mouth daily.  30 tablet  0  . levothyroxine (SYNTHROID, LEVOTHROID) 175 MCG tablet Take 175 mcg by mouth daily.        Marland Kitchen lisinopril (PRINIVIL,ZESTRIL) 40 MG tablet Take 40 mg by mouth daily.        . metFORMIN (GLUCOPHAGE) 500 MG tablet Take 500 mg by mouth 2 (two) times daily with a meal.        . Multiple Vitamins-Minerals (CENTRUM PO) Take 1 tablet by mouth daily.        Marland Kitchen oxyCODONE (OXY IR/ROXICODONE) 5 MG immediate release tablet 1-2 tabs po q6h prn pain  30 tablet  0  . zolpidem (AMBIEN) 10 MG tablet Take 10 mg by mouth at bedtime as needed.         Allergies  Allergen Reactions  . Codeine     REACTION: unknown  . Morphine     REACTION: unknown     PE:  Blood pressure 124/52, pulse 64, height 5' 4.5" (1.638 m), weight 282 lb (127.914 kg). Gen: Alert, well appearing.  Patient is oriented to person, place, time, and situation. LEGS: scattered large varicose veins from  dorsum of feet to upper thighs bilaterally.  There are 2 tiny spots on left ankle that appear to have superficial maceration very focally.  No erythema, no asymmetry, no nodularity.  She has 2-3 + tender, pitting edema from knees down into ankles.  IMPRESSION AND PLAN:  Chronic venous insufficiency Some of her spider veins are so superficial that mild abrasion of the skin causes rupture. I have counseled her again on low sodium diet, elevation of legs prn, and I added lasix 40mg  qd today.  I'll see if we can find a pharmacy to do ted hose fitting for her.  She'll cover the two small brittle areas on left ankle for now, esp while sleeping. I advised her to keep the appt she made for herself at [facility] in [place] in May. Check BMET in 1 wk.  Chronic pain Severe claustrophobia has prevented her Pain Mgmt MD's necessary workup.  No pain meds unless this test is completed per her orders. She agreed to try the MRI of her L/S spine again with premedication with xanax 1mg .  I'll contact Dr. Oneal Grout, her pain management MD about this.     FOLLOW UP:  Return if symptoms worsen or fail to improve.

## 2010-07-11 NOTE — Progress Notes (Signed)
Addended by: Nicoletta Ba on: 07/11/2010 04:58 PM   Modules accepted: Orders

## 2010-07-12 ENCOUNTER — Telehealth: Payer: Self-pay | Admitting: *Deleted

## 2010-07-12 NOTE — Telephone Encounter (Signed)
Patient has been scheduled for lab on the same day as her B-12  4.27.12 -3:15 pm.

## 2010-07-12 NOTE — Telephone Encounter (Signed)
Pt left voicemail at 1030a.  I have attempted to contact this patient by phone with the following results: left message to return my call on answering machine.

## 2010-07-15 ENCOUNTER — Encounter: Payer: BC Managed Care – PPO | Admitting: Vascular Surgery

## 2010-07-16 ENCOUNTER — Telehealth: Payer: Self-pay | Admitting: *Deleted

## 2010-07-16 NOTE — Telephone Encounter (Signed)
Pt left message on voicemail. She has 3 questions. 1--she would like a different pain management doctor.  Per pt she has advised them she is agreeable to have MRI and no one will return her call to schedule.  Advised pt I would call Dr. Migdalia Dk office to check status of MRI appt. 2--pt is unable to purchase synthroid at this time and would like samples.  Advised pt that we do not have any thyroid medication samples.  Pt voices understanding. 3--pt went to [facility] for compression stockings and was told they do not have rx from [place].  Pt was told she could get these stockings at a medical supply place at [facility].  Advised pt I do not know of any supply store at [facility], and they probably meant [employer]. I gave pt the address and phone number to [facility] and advised they have compression stockings and do fittings.  She will go there for stockings.  She said she can use the rx she was given previously and does not need [place] to fax another order.  1a--I called Dr. Migdalia Dk office and spoke to [facility] who does not have any record of the patient calling to request MRI.  Charity will start on scheduling.  Advised pt claustrophobic and has been given rx for Xanax to use prior to procedure.  Charity will call me with appt update.  RC to pt.  Advised her that Kelton Pillar is working on MRI appt and she will call me with details when she has them.  Advised pt I would call her when I have those details.

## 2010-07-20 ENCOUNTER — Emergency Department (HOSPITAL_BASED_OUTPATIENT_CLINIC_OR_DEPARTMENT_OTHER)
Admission: EM | Admit: 2010-07-20 | Discharge: 2010-07-20 | Disposition: A | Discharge status: Home / Self Care | Payer: BC Managed Care – PPO | Attending: Emergency Medicine | Admitting: Emergency Medicine

## 2010-07-20 ENCOUNTER — Emergency Department (INDEPENDENT_AMBULATORY_CARE_PROVIDER_SITE_OTHER): Payer: BC Managed Care – PPO

## 2010-07-20 DIAGNOSIS — E039 Hypothyroidism, unspecified: Secondary | ICD-10-CM | POA: Insufficient documentation

## 2010-07-20 DIAGNOSIS — X58XXXA Exposure to other specified factors, initial encounter: Secondary | ICD-10-CM | POA: Insufficient documentation

## 2010-07-20 DIAGNOSIS — I10 Essential (primary) hypertension: Secondary | ICD-10-CM | POA: Insufficient documentation

## 2010-07-20 DIAGNOSIS — Z79899 Other long term (current) drug therapy: Secondary | ICD-10-CM | POA: Insufficient documentation

## 2010-07-20 DIAGNOSIS — W268XXA Contact with other sharp object(s), not elsewhere classified, initial encounter: Secondary | ICD-10-CM

## 2010-07-20 DIAGNOSIS — S61209A Unspecified open wound of unspecified finger without damage to nail, initial encounter: Secondary | ICD-10-CM

## 2010-07-20 DIAGNOSIS — E119 Type 2 diabetes mellitus without complications: Secondary | ICD-10-CM | POA: Insufficient documentation

## 2010-07-22 ENCOUNTER — Other Ambulatory Visit: Payer: Self-pay | Admitting: Family Medicine

## 2010-07-23 ENCOUNTER — Other Ambulatory Visit: Payer: Self-pay | Admitting: *Deleted

## 2010-07-23 MED ORDER — ESTROGENS CONJUGATED 0.625 MG PO TABS: 0.6250 mg | ORAL_TABLET | Freq: Every day | ORAL | Status: DC

## 2010-07-23 NOTE — Telephone Encounter (Signed)
Faxed request from pharm.  Per Dr. Rubie Maid to for 30 w/3 refills.

## 2010-07-26 ENCOUNTER — Other Ambulatory Visit (INDEPENDENT_AMBULATORY_CARE_PROVIDER_SITE_OTHER): Payer: BC Managed Care – PPO

## 2010-07-26 ENCOUNTER — Ambulatory Visit (INDEPENDENT_AMBULATORY_CARE_PROVIDER_SITE_OTHER): Payer: BC Managed Care – PPO

## 2010-07-26 ENCOUNTER — Ambulatory Visit: Payer: BC Managed Care – PPO

## 2010-07-26 DIAGNOSIS — I872 Venous insufficiency (chronic) (peripheral): Secondary | ICD-10-CM

## 2010-07-26 DIAGNOSIS — E538 Deficiency of other specified B group vitamins: Secondary | ICD-10-CM

## 2010-07-26 MED ORDER — CYANOCOBALAMIN 1000 MCG/ML IJ SOLN
1000.0000 ug | Freq: Once | INTRAMUSCULAR | Status: AC
  Administered 2010-07-26: 1000 ug via INTRAMUSCULAR

## 2010-07-27 LAB — BASIC METABOLIC PANEL
BUN: 15 mg/dL (ref 6–23)
CO2: 24 mEq/L (ref 19–32)
Chloride: 101 mEq/L (ref 96–112)
Creat: 0.61 mg/dL (ref 0.40–1.20)
Glucose, Bld: 87 mg/dL (ref 70–99)

## 2010-08-02 ENCOUNTER — Other Ambulatory Visit: Payer: Self-pay | Admitting: *Deleted

## 2010-08-02 MED ORDER — LISINOPRIL 40 MG PO TABS: 40.0000 mg | ORAL_TABLET | Freq: Every day | ORAL | Status: DC

## 2010-08-02 NOTE — Telephone Encounter (Signed)
Faxed request received from pharmacy for refill.  30 day supply sent to pharm.  Pt has follow up appt on 08/23/10

## 2010-08-08 ENCOUNTER — Other Ambulatory Visit: Payer: Self-pay | Admitting: *Deleted

## 2010-08-08 NOTE — Telephone Encounter (Signed)
Return call from patient who states she picked up zolpidem earlier today.  RX denied at this time.  Pt will need refill auth next month.

## 2010-08-08 NOTE — Telephone Encounter (Signed)
Faxed request for zolpidem refill received from [facility].  I have attempted to contact this patient by phone with the following results: message left to return my call with [facility]

## 2010-08-16 NOTE — Op Note (Signed)
[facility]  Patient:    Tricia Perry, Tricia Perry                         MRN: [id number] Proc. Date: 12/05/99 Adm. Date:  [id number] Attending:  Katrina Stack CC:         2 copies to Dr. Waymon Budge office   Operative Report  PREOPERATIVE DIAGNOSIS:  Abnormal uterine bleeding with endometrial polyps demonstrated by saline infusion sonography.  POSTOPERATIVE DIAGNOSIS:  Abnormal uterine bleeding with endometrial polyps demonstrated by saline infusion sonography.  PROCEDURE:  Hysteroscopy, resection of endometrial polyps, total endometrial resection for ablation, plus Vaportrode.  SURGEON:  Gretta Cool, M.D.  ANESTHESIA:  IV sedation plus paracervical block.  DESCRIPTION OF PROCEDURE:  Under excellent MAC anesthesia as above with the patient prepped and draped in the lithotomy position, the cervix was grasped with single-tooth tenaculum and pulled down into view.  It was then progressively dilated with a series of Pratt dilators to accommodate a 7 mm resectoscope.  The endometrial cavity was then totally resected.  The entire endometrium was removed and once all of the visible endometrial tissue was resected down at least 3 mm into myometrial tissue, the Vaportrode was applied and the entire cavity treated by Vaportrode so as to eliminate any islands of visible endometrial tissue that were not identified.  At this point, the pressure was reduced.  There was no significant bleeding.  Fluid deficit was approximately 100 cc.  No complications.  The patient was returned to the recovery room in excellent condition. DD:  12/05/99 TD:  12/05/99 Job: [id number]

## 2010-08-16 NOTE — Op Note (Signed)
Tricia Perry. [facility]  Patient:    Tricia Perry                          MRN: [id number] Proc. Date: 02/05/99 Adm. Date:  [id number] Attending:  Drema Pry CC:         Tricia Perry, M.D.             Bernadene Person, M.D.                           Operative Report  PREOPERATIVE DIAGNOSES: 1. Right foot planar fasciitis, chronic. 2. Right tarsal tunnel syndrome. 3. Posterior tibialis tendon tenosynovitis.  POSTOPERATIVE DIAGNOSES: 1. Right foot planar fasciitis, chronic. 2. Right tarsal tunnel syndrome. 3. Posterior tibialis tendon tenosynovitis.  PROCEDURES: 1. Right heel endoscopically assisted plantar fascia release. 2. Right tarsal tunnel release of posterior tibial nerve. 3. Release and tenosynovectomy partial to posterior tibialis tendon sheath.  SURGEON:  Tricia Perry, M.D.  ASSISTANT:  Tricia Perry. Tricia Perry.  ANESTHESIA:  General endotracheal.  CULTURES:  None.  DRAINS:  None.  ESTIMATED BLOOD LOSS:  Minimal.  TOURNIQUET TIME:  38 minutes.  PATHOLOGIC FINDINGS AND HISTORY:  Tricia Perry first presented to [place] on May 11, 1998 with numbness and tingling in both hands and feet.  She had had several injections in her heel by a local podiatrist and the use of arch supports.  Our diagnoses t that time was bilateral tarsal tunnel syndrome, some peripheral neuropathy, and  bilateral plantar fasciitis.  She was overweight.  We sent her for some studies. She came back on June 01, 1998, and her nerve conductions showed that there was  compression at the tibial tarsal tunnel consistent with clinical findings of a tarsal tunnel syndrome and numbness of the bottom of the foot.  She also had had persistent problems with her plantar fascia, and we injected the tarsal tunnel n the left side at that point with cortisone Marcaine.  By January 19, 1999, her plantar fascia pain had coallesced to the right side.  She had had the  symptoms for over a year.  She saw Dr. August Perry at [facility] who obtained an MRI of the right ankle which showed a plantar fasciitis and tenosynovitis along the posterior tibialis tendon and to a lesser extent along the flexor digitorum longus.  She ad a nerve conduction which showed an absent tibial plantar mix and tibial sensory  response that were consistent with a tarsal tunnel syndrome.  This was on the right and left.  Her symptoms and physical findings were consistent with the tarsal tunnel syndrome with tenderness to tapping over the tarsal tunnel, numbness and  tingling into the bottom of the foot, and also, there was some technical difficulties with her nerve conductions, but they still could be seen in tarsal  tunnel syndrome.  With these clinical findings, the chronic plantar fasciitis pain and the tenderness along the posterior tibialis tendon, along with the MRI data, led [place] to conclude that the time had come for plantar fascial release and exploration and release of the tarsal tunnel.  Again, the right side was a symptomatic side at this point, but she had had problems in both hands and feet. At surgery, we found a large varicose vein intertwined in the middle of the tibial nerve, which ultimately branched into the mediolateral plantar branches and the  calcaneal branch,  and we traced all these and released them well.  I did not cauterize the vein.  I left it intact but did remove the tight retinaculum over  top.  In fact, I took the retinaculum up and sutured it to the anterior portion of the tenosynovium of the posterior tibialis tendon sheath where I had released it. It was very tight around the corner.  I did not want it to be tight, but I did ot want it to sublux, so I just used this leaf of retinaculum to suture that down, and therefore, still contain the posterior tibialis tendon although in an expanded sheath and will prevent the retinaculum  from scarring down over the nerve.  The  nerve was free distally and proximally.  The plantar fascia release was classic  findings of a gritty plantar fascia with a thick plantar fascia release from both on top and below.  DESCRIPTION OF PROCEDURE:  With adequate anesthesia obtained using endotracheal  technique, 1 g of Ancef was given IV prophylaxis.  The patient was placed in the supine position.  The right foot was placed in a foot holder just distal to the  knee and the proximal calf.  The right foot was prepped and draped in the standard fashion.  Esmarch exsanguination was used.  The tourniquet was inflated up to 400 mmHg.  We then plotted coordinates 5 cm anterior to the posterior heel and cm up, made our medial portal for the endoscope for the plantar fascial release, spread with a hemostat, and then brought in a slotted trocar over the plantar fascia and out the other side.  I then looked in with a scope, turned the slot downward and released the plantar fascia on top.  I then reentered with the slotted cannula underneath the plantar fascia looking up and then with a clearer view of the plantar fascia, at this point, released it all the way across with the release of the very thick plantar fascia from medial subcutaneum to lateral subcutaneum of the heel.  I then brought in a small osteotome and completed scraping the heel through the portals medially and laterally.  Attention was then turned to the posterior malleolar area where a longitudinal incision was made.  The incision as deep and sharp with a knife, and hemostasis was obtained using the Bovie electrocoagulator.  Dissection was carried down to the retinaculum which was released, exposing the vein over the tibial nerve which was carefully dissected and released underneath the fascia proximally and distally into all three branches making sure it was completely free of any type bands well out into  the peripheral branching of the three branches of the posterior tibial nerve.  I then explored the tendon sheaths just medial to the medial malleolus finding the digitorum longus and  the posterior tibial tendon and released the posterior tibial around the corner of the medial malleolus finding it to be very tight, stenosed around the corner and with some tenosynovium, which I incised.  I incised the tendon all the way around the sheath where it was tight.  I then pulled up the anterior leaf of the previously released flexor retinaculum and sutured that down to provide a checkrein for the posterior tibialis tendon but not tightening it.  Irrigation was carried out.  The tourniquet was let down.  Bleeding points were cauterized.  We had a ery dry field.  The wound was closed in layers with 2-0 and 3-0 Vicryl and 4-0 nylon. A bulky, sterile,  compressive dressing was applied with Ace ______ .  The patient having tolerated the procedure well, was awakened and taken to the recovery room in satisfactory condition to be discharged through outpatient routine, told to call the office for an appointment for a recheck on Friday, given Tylox for pain, to  continue elevation, and partial weight-bearing with crutches. DD:  02/05/99 TD:  02/06/99 Job: [id number]

## 2010-08-23 ENCOUNTER — Ambulatory Visit (INDEPENDENT_AMBULATORY_CARE_PROVIDER_SITE_OTHER): Payer: BC Managed Care – PPO | Admitting: *Deleted

## 2010-08-23 DIAGNOSIS — E538 Deficiency of other specified B group vitamins: Secondary | ICD-10-CM

## 2010-08-23 MED ORDER — CYANOCOBALAMIN 1000 MCG/ML IJ SOLN
1000.0000 ug | Freq: Once | INTRAMUSCULAR | Status: AC
  Administered 2010-08-23: 1000 ug via INTRAMUSCULAR

## 2010-09-03 ENCOUNTER — Other Ambulatory Visit: Payer: Self-pay

## 2010-09-03 MED ORDER — ZOLPIDEM TARTRATE 10 MG PO TABS: 10.0000 mg | ORAL_TABLET | Freq: Every evening | ORAL | Status: DC | PRN

## 2010-09-03 NOTE — Telephone Encounter (Signed)
Faxed RX to pharmacy.  

## 2010-09-09 ENCOUNTER — Other Ambulatory Visit: Payer: Self-pay | Admitting: *Deleted

## 2010-09-09 DIAGNOSIS — E039 Hypothyroidism, unspecified: Secondary | ICD-10-CM

## 2010-09-09 MED ORDER — LEVOTHYROXINE SODIUM 175 MCG PO TABS: 175.0000 ug | ORAL_TABLET | Freq: Every day | ORAL | Status: DC

## 2010-09-09 NOTE — Telephone Encounter (Signed)
Pt called to request refill on synthroid.  Follow up appt on 09/27/10.

## 2010-09-26 ENCOUNTER — Ambulatory Visit (INDEPENDENT_AMBULATORY_CARE_PROVIDER_SITE_OTHER): Payer: BC Managed Care – PPO | Admitting: *Deleted

## 2010-09-26 DIAGNOSIS — E538 Deficiency of other specified B group vitamins: Secondary | ICD-10-CM

## 2010-09-26 MED ORDER — CYANOCOBALAMIN 1000 MCG/ML IJ SOLN
1000.0000 ug | Freq: Once | INTRAMUSCULAR | Status: AC
  Administered 2010-09-26: 1000 ug via INTRAMUSCULAR

## 2010-09-27 ENCOUNTER — Ambulatory Visit: Payer: BC Managed Care – PPO

## 2010-10-03 ENCOUNTER — Other Ambulatory Visit: Payer: Self-pay | Admitting: *Deleted

## 2010-10-03 DIAGNOSIS — I10 Essential (primary) hypertension: Secondary | ICD-10-CM

## 2010-10-03 MED ORDER — LISINOPRIL 40 MG PO TABS: 40.0000 mg | ORAL_TABLET | Freq: Every day | ORAL | Status: DC

## 2010-10-03 NOTE — Telephone Encounter (Signed)
Faxed request received for refill on Lisinopril.  30 day rx sent.  Pt is due for follow up office visit.

## 2010-10-04 ENCOUNTER — Other Ambulatory Visit: Payer: Self-pay | Admitting: *Deleted

## 2010-10-04 DIAGNOSIS — E119 Type 2 diabetes mellitus without complications: Secondary | ICD-10-CM

## 2010-10-04 MED ORDER — METFORMIN HCL 500 MG PO TABS: 500.0000 mg | ORAL_TABLET | Freq: Two times a day (BID) | ORAL | Status: DC

## 2010-10-04 NOTE — Telephone Encounter (Signed)
Faxed request received for refill.  Pt is due for follow up.  30-day supply sent.

## 2010-10-24 ENCOUNTER — Ambulatory Visit (INDEPENDENT_AMBULATORY_CARE_PROVIDER_SITE_OTHER): Payer: BC Managed Care – PPO | Admitting: *Deleted

## 2010-10-24 DIAGNOSIS — E538 Deficiency of other specified B group vitamins: Secondary | ICD-10-CM

## 2010-10-24 MED ORDER — CYANOCOBALAMIN 1000 MCG/ML IJ SOLN
1000.0000 ug | Freq: Once | INTRAMUSCULAR | Status: AC
  Administered 2010-10-24: 1000 ug via INTRAMUSCULAR

## 2010-11-05 ENCOUNTER — Other Ambulatory Visit: Payer: Self-pay | Admitting: *Deleted

## 2010-11-05 DIAGNOSIS — I10 Essential (primary) hypertension: Secondary | ICD-10-CM

## 2010-11-05 MED ORDER — LISINOPRIL 40 MG PO TABS: 40.0000 mg | ORAL_TABLET | Freq: Every day | ORAL | Status: DC

## 2010-11-05 NOTE — Telephone Encounter (Signed)
Refill request received from pharmacy.  Pt has appt on 11/12/10.  RX sent.

## 2010-11-12 ENCOUNTER — Ambulatory Visit: Payer: Self-pay | Admitting: Family Medicine

## 2010-11-13 ENCOUNTER — Other Ambulatory Visit: Payer: Self-pay | Admitting: *Deleted

## 2010-11-13 MED ORDER — ESTROGENS CONJUGATED 0.625 MG PO TABS: 0.6250 mg | ORAL_TABLET | Freq: Every day | ORAL | Status: DC

## 2010-11-13 NOTE — Telephone Encounter (Signed)
Faxed request from pharmacy.  Pt has follow up on 11/28/10.  30 day supply given.

## 2010-11-28 ENCOUNTER — Ambulatory Visit: Payer: BC Managed Care – PPO

## 2010-11-28 ENCOUNTER — Ambulatory Visit (INDEPENDENT_AMBULATORY_CARE_PROVIDER_SITE_OTHER): Payer: BC Managed Care – PPO

## 2010-11-28 DIAGNOSIS — E538 Deficiency of other specified B group vitamins: Secondary | ICD-10-CM

## 2010-11-28 MED ORDER — CYANOCOBALAMIN 1000 MCG/ML IJ SOLN
1000.0000 ug | Freq: Once | INTRAMUSCULAR | Status: AC
  Administered 2010-11-28: 1000 ug via INTRAMUSCULAR

## 2010-12-03 ENCOUNTER — Encounter: Payer: Self-pay | Admitting: Family Medicine

## 2010-12-03 ENCOUNTER — Ambulatory Visit (INDEPENDENT_AMBULATORY_CARE_PROVIDER_SITE_OTHER): Payer: BC Managed Care – PPO | Admitting: Family Medicine

## 2010-12-03 DIAGNOSIS — E039 Hypothyroidism, unspecified: Secondary | ICD-10-CM

## 2010-12-03 DIAGNOSIS — R079 Chest pain, unspecified: Secondary | ICD-10-CM

## 2010-12-03 DIAGNOSIS — E119 Type 2 diabetes mellitus without complications: Secondary | ICD-10-CM

## 2010-12-03 DIAGNOSIS — I872 Venous insufficiency (chronic) (peripheral): Secondary | ICD-10-CM

## 2010-12-03 DIAGNOSIS — G609 Hereditary and idiopathic neuropathy, unspecified: Secondary | ICD-10-CM

## 2010-12-03 DIAGNOSIS — G629 Polyneuropathy, unspecified: Secondary | ICD-10-CM

## 2010-12-03 DIAGNOSIS — I10 Essential (primary) hypertension: Secondary | ICD-10-CM

## 2010-12-03 DIAGNOSIS — R42 Dizziness and giddiness: Secondary | ICD-10-CM

## 2010-12-03 LAB — COMPREHENSIVE METABOLIC PANEL
Albumin: 4.1 g/dL (ref 3.5–5.2)
BUN: 11 mg/dL (ref 6–23)
CO2: 27 mEq/L (ref 19–32)
Glucose, Bld: 88 mg/dL (ref 70–99)
Sodium: 138 mEq/L (ref 135–145)
Total Bilirubin: 0.4 mg/dL (ref 0.3–1.2)
Total Protein: 6.6 g/dL (ref 6.0–8.3)

## 2010-12-03 LAB — TSH: TSH: 1.824 u[IU]/mL (ref 0.350–4.500)

## 2010-12-03 LAB — HEMOGLOBIN A1C: Hgb A1c MFr Bld: 5.8 % — ABNORMAL HIGH (ref <5.7)

## 2010-12-03 NOTE — Assessment & Plan Note (Signed)
Unfortunately I cannot get more than a vague description of this from her. Will ask neurologist to see her.

## 2010-12-03 NOTE — Assessment & Plan Note (Signed)
Due for HbA1c today. Continue metformin 500mg  bid.  Encouraged patient to make better effort to eat diabetic + low chol/low fat diet.

## 2010-12-03 NOTE — Assessment & Plan Note (Addendum)
Has history of NCS of LE's in the past but I've never been able to get old records and she refuses to have the test done again b/c it was painful. Likely multifactorial: vit b12 def, DM 2, hypoth, possible lumbar spinal stenosis.  Pt says sx's unchanged since replacing vit B12, which was low-normal back in Feb 2012.   Will d/c B12 injections.  She can start one OTC vit B12 tab once daily.  Recheck level in 64mo.

## 2010-12-03 NOTE — Progress Notes (Signed)
OFFICE VISIT  12/03/2010   CC:  Chief Complaint  Patient presents with  . Follow-up     HPI:    Patient is a 55 y.o. Caucasian female who presents for routine f/u. CBGs good fasting: 100-110.   Still having recurrent feeling of lightheaded/unbalanced and eyes "spotty" on and off.  HAs sometimes in morning but not clearly related to her balance/vision symptoms.  Has appt for eye MD this afternoon.  Reports being under a lot of stress last few months and wonders if this is playing a role in her vague balance issues, which she finds hard to describe.  Denies vertigo, tinnitis, or hearing impairment.  We have tried to obtain old records pertaining to nerve conduction studies that she thinks were done at her former MD in [facility].  We also tried to get MRI brain after I initially saw her here earlier this year but she was too claustrophobic to do this.    She then says she has been having some "sharp" chest pains with some associated chest heaviness over the last month or two.  Occurs at rest or walking, no clear trigger.  She does not feel like the episodes occur in connection with her balance+spotty vision episodes.  CP lasts a few minutes.  No palpitations, no diaphoresis, no SOB, no cough.    Has been getting b12 injections here regulary for borderline low vit B12 level but this hasn't made any difference in her balance complaints.   Wants to change to vit B12 PO to see if levels stay ok.  She continues to see Dr. Oneal Grout for pain management. Past Medical History  Diagnosis Date  . Insulin resistance   . Hypertension   . Thyroid disease     Hypo; TSH 02/19/10=1.14  . 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 DDD (degenerative disc disease)     Dr. Renae Fickle evaluating  . GERD (gastroesophageal reflux disease)   . Insomnia   . Obesities, morbid     BMI 49.  Gastric stapling surgery in distant past.  . Heart murmur     functional, w/u done  . Asthma   .  Peripheral neuropathy   . Carpal tunnel syndrome     bilateral  . Fibromyalgia     questionable  . Nephrolithiasis     Past Surgical History  Procedure Date  . Cholecystectomy 1987  . Joint replacement 2009    Right Knee, [facility]  . Foot surgery 2000    Left tarsel tunnel release  . Hernia repair  1980's    Diaphragmatic + gastric stapling  . Hernia repair     Inguinal  . Extracorporeal shock wave lithotripsy   . Endometrial ablation     menorrhagia    Outpatient Prescriptions Prior to Visit  Medication Sig Dispense Refill  . cyanocobalamin (,VITAMIN B-12,) 1000 MCG/ML injection Inject 1,000 mcg into the muscle every 30 (thirty) days.        Marland Kitchen estrogens, conjugated, (PREMARIN) 0.625 MG tablet Take 1 tablet (0.625 mg total) by mouth daily.  30 tablet  0  . flyticasone (CUTIVATE) 0.005 % ointment Apply to affected areas twice daily as needed  60 g  3  . furosemide (LASIX) 40 MG tablet 1 tab po qd  30 tablet  1  . levothyroxine (SYNTHROID, LEVOTHROID) 175 MCG tablet Take 1 tablet (175 mcg total) by mouth daily.  30 tablet  2  . lisinopril (PRINIVIL,ZESTRIL) 40 MG  tablet Take 1 tablet (40 mg total) by mouth daily. Must keep 8/14 visit for more refills  30 tablet  0  . metFORMIN (GLUCOPHAGE) 500 MG tablet Take 1 tablet (500 mg total) by mouth 2 (two) times daily with a meal. Must have office visit for more refills  60 tablet  0  . Multiple Vitamins-Minerals (CENTRUM PO) Take 1 tablet by mouth daily.        Marland Kitchen zolpidem (AMBIEN) 10 MG tablet Take 1 tablet (10 mg total) by mouth at bedtime as needed.  30 tablet  3  . Ascorbic Acid (VITAMIN C) 500 MG tablet Take 1-2 tablets by mouth once daily      . Calcium Citrate-Vitamin D (CITRACAL PETITES/VITAMIN D) 200-250 MG-UNIT TABS Take 1 tablet by mouth daily.        . Elastic Bandages & Supports (B-4 MED COMPRESSION HOSE MENS) MISC Wear on legs every day as much as possible.  May go without these while sleeping.  1 each  1  . estrogens,  conjugated, (PREMARIN) 0.625 MG tablet Take 1 tablet (0.625 mg total) by mouth daily.  30 tablet  0  . ALPRAZolam (XANAX) 1 MG tablet Take 1 tab about 1 hour prior to your MRI.  May repeat 1 tab in 1 hour if still extremely anxious.  2 tablet  0  . oxyCODONE (OXY IR/ROXICODONE) 5 MG immediate release tablet 1-2 tabs po q6h prn pain  30 tablet  0    Allergies  Allergen Reactions  . Codeine     REACTION: unknown  . Morphine     REACTION: unknown    ROS As per HPI  PE: Blood pressure 126/83, pulse 69, SpO2 100.00%. Gen: Alert, well appearing.  Patient is oriented to person, place, time, and situation. Chest: symmetric expansion, nonlabored respirations.  Clear and equal breath sounds in all lung fields.   CV: RRR, soft systolic ejection murmur at base.  Peripheral pulses 2+ and symmetric. EXT: 2+ edema in both ankles, scattered varicosities.  LABS:  EKG today: NSR, no ischemic changes, no hypertrophy.   IMPRESSION AND PLAN:  Chest pain Atypical chest pain in 55 y/o female with multiple CAD risk factors: refer to cardiology for consideration of stress testing.  Chronic venous insufficiency Stable.  She'll continue f/u with [facility] for her hx of brittle superficial varicosities.  DIAB W/O COMP TYPE II/UNS NOT STATED UNCNTRL Due for HbA1c today. Continue metformin 500mg  bid.  Encouraged patient to make better effort to eat diabetic + low chol/low fat diet.  ESSENTIAL HYPERTENSION Problem stable.  Continue current medications and diet appropriate for this condition.  We have reviewed our general long term plan for this problem and also reviewed symptoms and signs that should prompt the patient to call or return to the office. Check lytes/cr today.   HYPOTHYROIDISM Due for routine TSH monitoring today.  Peripheral neuropathy Has history of NCS of LE's in the past but I've never been able to get old records and she refuses to have the test done again b/c it was  painful. Likely multifactorial: vit b12 def, DM 2, hypoth, possible lumbar spinal stenosis.  Pt says sx's unchanged since replacing vit B12, which was low-normal back in Feb 2012.   Will d/c B12 injections.  She can start one OTC vit B12 tab once daily.  Recheck level in 75mo.  Dizziness Unfortunately I cannot get more than a vague description of this from her. Will ask neurologist to see her.  FOLLOW UP: Return in about 4 days (around 12/07/2010) for f/u dm.

## 2010-12-03 NOTE — Assessment & Plan Note (Signed)
Stable.  She'll continue f/u with [facility] for her hx of brittle superficial varicosities.

## 2010-12-03 NOTE — Assessment & Plan Note (Addendum)
Atypical chest pain in 55 y/o female with multiple CAD risk factors: refer to cardiology for consideration of stress testing.

## 2010-12-03 NOTE — Assessment & Plan Note (Signed)
Due for routine TSH monitoring today.

## 2010-12-03 NOTE — Assessment & Plan Note (Addendum)
Problem stable.  Continue current medications and diet appropriate for this condition.  We have reviewed our general long term plan for this problem and also reviewed symptoms and signs that should prompt the patient to call or return to the office. Check lytes/cr today.  

## 2010-12-04 ENCOUNTER — Other Ambulatory Visit: Payer: Self-pay

## 2010-12-04 DIAGNOSIS — I10 Essential (primary) hypertension: Secondary | ICD-10-CM

## 2010-12-04 DIAGNOSIS — E039 Hypothyroidism, unspecified: Secondary | ICD-10-CM

## 2010-12-04 MED ORDER — LEVOTHYROXINE SODIUM 175 MCG PO TABS: 175.0000 ug | ORAL_TABLET | Freq: Every day | ORAL | Status: DC

## 2010-12-04 MED ORDER — LISINOPRIL 40 MG PO TABS: 40.0000 mg | ORAL_TABLET | Freq: Every day | ORAL | Status: DC

## 2010-12-05 ENCOUNTER — Encounter: Payer: Self-pay | Admitting: Family Medicine

## 2010-12-09 ENCOUNTER — Encounter: Payer: Self-pay | Admitting: Neurology

## 2010-12-09 ENCOUNTER — Ambulatory Visit (INDEPENDENT_AMBULATORY_CARE_PROVIDER_SITE_OTHER): Payer: BC Managed Care – PPO | Admitting: Neurology

## 2010-12-09 DIAGNOSIS — H832X9 Labyrinthine dysfunction, unspecified ear: Secondary | ICD-10-CM

## 2010-12-09 MED ORDER — ALPRAZOLAM 1 MG PO TABS: 1.0000 mg | ORAL_TABLET | Freq: Every evening | ORAL | Status: DC | PRN

## 2010-12-09 NOTE — Progress Notes (Signed)
Dear Dr. Milinda Cave,   Thank you for having [place] see Tricia Perry in consultation today at [facility]. As you may recall she is a 55 year old right-hand dominant woman who has a history of morbid obesity, hypothyroidism, diabetes, bilateral carpal tunnel syndrome who presents with spells of dizziness. She describes these spells as having started 2 years ago. They were occurring less frequently but now are occurring every day. They last hours. He did not seem to provoke by head movement or standing up. She feels like she gets blurriness of vision with them as well. She denies any changes in her hearing. She has had audiometry and this was noted to be unremarkable. She has not had any vestibular testing. She she does get ringing in her ears. She also complains of chronic morning headaches. However these don't seem to be related to her dizziness spells. In fact her dizziness tends to come on after lunch. She denies frank vertigo. She says it is more of a lightheadedness. She denies dysphagia or dysarthria with the spells.  Past medical history significant for diabetes, morbid obesity, E. 12 deficiency, hypertension, obstructive sleep apnea.  Past surgical history includes a right total knee replacement.  Social history she does not smoke. He uses tobacco only socially.  Family history: There is a strong history of epilepsy in the family. The patient has never had seizures however.  Review of systems 13 systems were reviewed and is significant for chronic leg pain and back pain. She also has numbness and tingling of her bilateral hands secondary to carpal tunnel syndrome. She does have difficulty sleeping. She suffers from morning headaches.  Other 13 point ROS was negative.  Examination: Filed Vitals:   12/09/10 1052  BP: 132/82  Pulse: 76  No significant drop in BP from supine to standing.  Gen:  Morbidly obese woman in NAD.  Cardiovascular: The patient has a regular rate and rhythm and no  carotid bruits.  Fundoscopy:  Disks are flat. Vessel caliber within normal limits.  Mental status:   The patient is oriented to person, place and time. Recent and remote memory are intact. Attention span and concentration are normal. Language including repetition, naming, following commands are intact. Fund of knowledge of current and historical events, as well as vocabulary are normal.  Cranial Nerves: Pupils are equally round and reactive to light. Visual fields full to confrontation. Extraocular movements are intact without nystagmus, she has a comitant exodeviation on maddox rod. Facial sensation and muscles of mastication are intact. Muscles of facial expression are symmetric. Hearing intact to bilateral finger rub. Tongue protrusion, uvula, palate midline.  Shoulder shrug intact.   Motor:  The patient has normal bulk and tone, no pronator drift and 5/5 strength bilaterally.  There are no adventitious movements.  Reflexes:  Are 2+ bilaterally in the upper extremeties, absent in the lower extremities, toes down.  Coordination:  Normal finger to nose.  No dysdiadokinesia.  Sensation is decreased in her feet distally to temperature and vibration.  However, proprioception is intact.  Gait and Station are antalgic.  Romberg she sways.  Cannot tandem gait.  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  Head shaking nystagmus negative.  Fukuda stepping test reveals turn to the left.  Impression:  Virgilene Stryker is a 55 year old woman with morbid obesity, diabetes, hypertension, hypothyroidism and was having spells of dizziness. 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 Her history is quite nonspecific however. I think we should  first  rule out any mass lesion affecting her vestibular apparatus. I'm going to try to get an MRI of her brain with and without contrast. I've given her Xanax 2 mg to use before the study.Her general unsteadiness is probably contributed to  by her diabetic peripheral neuropathy. However I do not think explains all of her symptoms.  If the MRI is unrevealing I will likely get an EEG as well, although dizziness would be an unusual presentation for a seizure disorder.  We will see the patient back in about 6 weeks.  Thank you for having [place] see this patient in consultation.  Feel free to contact me with any questions.  Lupita Raider Modesto Charon, MD [facility], [place] [address] [place], [place] [address] Phone: [phone] Fax: [phone].

## 2010-12-09 NOTE — Patient Instructions (Signed)
Your MRI has been scheduled on Saturday, Sept. 15th at 1:00pm.  Please arrive at 12:00 noon with your medication to take prior to the MRI.

## 2010-12-11 ENCOUNTER — Other Ambulatory Visit: Payer: Self-pay | Admitting: Gynecology

## 2010-12-11 DIAGNOSIS — Z1231 Encounter for screening mammogram for malignant neoplasm of breast: Secondary | ICD-10-CM

## 2010-12-13 ENCOUNTER — Ambulatory Visit
Admission: RE | Admit: 2010-12-13 | Discharge: 2010-12-13 | Disposition: A | Discharge status: Home / Self Care | Payer: BC Managed Care – PPO | Source: Ambulatory Visit | Attending: Gynecology | Admitting: Gynecology

## 2010-12-13 DIAGNOSIS — Z1231 Encounter for screening mammogram for malignant neoplasm of breast: Secondary | ICD-10-CM

## 2010-12-14 ENCOUNTER — Other Ambulatory Visit: Payer: Self-pay | Admitting: Neurology

## 2010-12-14 ENCOUNTER — Ambulatory Visit (HOSPITAL_COMMUNITY)
Admission: RE | Admit: 2010-12-14 | Discharge: 2010-12-14 | Disposition: A | Discharge status: Home / Self Care | Payer: BC Managed Care – PPO | Source: Ambulatory Visit | Attending: Neurology | Admitting: Neurology

## 2010-12-14 DIAGNOSIS — H832X9 Labyrinthine dysfunction, unspecified ear: Secondary | ICD-10-CM

## 2010-12-14 DIAGNOSIS — H938X9 Other specified disorders of ear, unspecified ear: Secondary | ICD-10-CM | POA: Insufficient documentation

## 2010-12-14 DIAGNOSIS — R42 Dizziness and giddiness: Secondary | ICD-10-CM | POA: Insufficient documentation

## 2010-12-16 ENCOUNTER — Institutional Professional Consult (permissible substitution): Payer: BC Managed Care – PPO | Admitting: Internal Medicine

## 2010-12-20 ENCOUNTER — Encounter: Payer: Self-pay | Admitting: Neurology

## 2010-12-20 ENCOUNTER — Other Ambulatory Visit: Payer: Self-pay | Admitting: Gynecology

## 2010-12-20 DIAGNOSIS — R928 Other abnormal and inconclusive findings on diagnostic imaging of breast: Secondary | ICD-10-CM

## 2010-12-30 ENCOUNTER — Ambulatory Visit (INDEPENDENT_AMBULATORY_CARE_PROVIDER_SITE_OTHER): Payer: BC Managed Care – PPO | Admitting: Family Medicine

## 2010-12-30 ENCOUNTER — Encounter: Payer: Self-pay | Admitting: Family Medicine

## 2010-12-30 ENCOUNTER — Telehealth: Payer: Self-pay | Admitting: Family Medicine

## 2010-12-30 VITALS — BP 124/62 | HR 68 | Ht 64.5 in | Wt 273.0 lb

## 2010-12-30 DIAGNOSIS — E538 Deficiency of other specified B group vitamins: Secondary | ICD-10-CM

## 2010-12-30 DIAGNOSIS — R42 Dizziness and giddiness: Secondary | ICD-10-CM

## 2010-12-30 DIAGNOSIS — Z23 Encounter for immunization: Secondary | ICD-10-CM

## 2010-12-30 MED ORDER — CYANOCOBALAMIN 1000 MCG/ML IJ SOLN: 1000.0000 ug | Freq: Once | INTRAMUSCULAR | Status: DC

## 2010-12-30 MED ORDER — TRAZODONE HCL 50 MG PO TABS: ORAL_TABLET | ORAL | Status: AC

## 2010-12-30 MED ORDER — CYANOCOBALAMIN 1000 MCG/ML IJ SOLN
1000.0000 ug | Freq: Once | INTRAMUSCULAR | Status: AC
  Administered 2010-12-30: 1000 ug via INTRAMUSCULAR

## 2010-12-30 NOTE — Progress Notes (Signed)
OFFICE NOTE  12/30/2010  CC:  Chief Complaint  Patient presents with  . Fluid in ear    fluid seen behind ear drum on MRI     HPI:   Patient is a 55 y.o. Caucasian female who is here for f/u chronic dizziness/balance problems. No change in her description of her symptoms (these have been outlined in detail in last couple of office notes) but she feels like she feels things more constant, more frequently bothered by the symptoms. Recent neurology eval by Dr. Modesto Charon; his impression was that she was having vestibular dysfunction.  Brain MRI w/out contrast was remarkable only for haziness in left tympanic cavity suggestive of fluid. Patient does not plan on f/u with Dr. Modesto Charon, and she is wondering if the fluid in the middle ear could be her problem.  No hearing deficit, no ear pain, no ringing in ears. Says glucoses have been near normal, denies hypoglycemia. Denies anxiety as a trigger.  Of note, she recently saw her GYN, Dr. Nicholas Lose, and he took her off of her premarin and started a low dose estrogen patch and progesterone pill (12 days/mo), also did an endometrial biopsy, results pending.  Pertinent PMH:  Past Medical History  Diagnosis Date  . Insulin resistance   . Hypertension   . Thyroid disease     Hypo; TSH 02/19/10=1.14  . 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 DDD (degenerative disc disease)     Dr. Renae Fickle evaluating  . GERD (gastroesophageal reflux disease)   . Insomnia   . Obesities, morbid     BMI 49.  Gastric stapling surgery in distant past.  . Heart murmur     functional, w/u done  . Asthma   . Peripheral neuropathy   . Carpal tunnel syndrome     bilateral  . Fibromyalgia     questionable  . Nephrolithiasis     MEDS;   Outpatient Prescriptions Prior to Visit  Medication Sig Dispense Refill  . ALPRAZolam (XANAX) 1 MG tablet Take 1 tablet (1 mg total) by mouth at bedtime as needed for anxiety (take 1-2 mg 30 minutes before MRI.).  3  tablet  0  . cyanocobalamin (,VITAMIN B-12,) 1000 MCG/ML injection Inject 1,000 mcg into the muscle every 30 (thirty) days.        Marland Kitchen levothyroxine (SYNTHROID, LEVOTHROID) 175 MCG tablet Take 1 tablet (175 mcg total) by mouth daily.  30 tablet  5  . lisinopril (PRINIVIL,ZESTRIL) 40 MG tablet Take 1 tablet (40 mg total) by mouth daily. Must keep 8/14 visit for more refills  30 tablet  5  . metFORMIN (GLUCOPHAGE) 500 MG tablet Take 1 tablet (500 mg total) by mouth 2 (two) times daily with a meal. Must have office visit for more refills  60 tablet  0  . Multiple Vitamins-Minerals (CENTRUM PO) Take 1 tablet by mouth daily.        Marland Kitchen oxyCODONE-acetaminophen (PERCOCET) 10-325 MG per tablet Take 1 tablet by mouth. Up to 5 times daily      . zolpidem (AMBIEN) 10 MG tablet Take 1 tablet (10 mg total) by mouth at bedtime as needed.  30 tablet  3  . Ascorbic Acid (VITAMIN C) 500 MG tablet Take 1-2 tablets by mouth once daily      . Calcium Citrate-Vitamin D (CITRACAL PETITES/VITAMIN D) 200-250 MG-UNIT TABS Take 1 tablet by mouth daily.        Jae Dire Bandages & Supports (B-4  MED COMPRESSION HOSE MENS) MISC Wear on legs every day as much as possible.  May go without these while sleeping.  1 each  1  . flyticasone (CUTIVATE) 0.005 % ointment Apply to affected areas twice daily as needed  60 g  3  . furosemide (LASIX) 40 MG tablet 1 tab po qd  30 tablet  1  . estrogens, conjugated, (PREMARIN) 0.625 MG tablet Take 1 tablet (0.625 mg total) by mouth daily.  30 tablet  0  . estrogens, conjugated, (PREMARIN) 0.625 MG tablet Take 1 tablet (0.625 mg total) by mouth daily.  30 tablet  0   No facility-administered medications prior to visit.    PE: Blood pressure 124/62, pulse 68, height 5' 4.5" (1.638 m), weight 273 lb (123.832 kg), SpO2 99.00%. Gen: Alert, well appearing.  Patient is oriented to person, place, time, and situation. HEENT:  Ears: EACs clear, normal epithelium.  TMs with good light reflex and  landmarks bilaterally.  Eyes: no injection, icteris, swelling, or exudate.  EOMI, PERRLA. Nose: no drainage or turbinate edema/swelling.  No injection or focal lesion.  Mouth: lips without lesion/swelling.  Oral mucosa pink and moist.  Dentition intact and without obvious caries or gingival swelling.  Oropharynx without erythema, exudate, or swelling.    IMPRESSION AND PLAN:  DIZZINESS I have low suspicion that the left middle ear cavity fluid/haziness seen on the brain MRI is causing her sx's. I see no middle ear fluid or other abnormality today on otoscopy. In review of her med list, I wonder if zolpidem could be causing some of her problems. Will ween off zolpidem over the next 1 wk (1/2 tab qhs x 7d, then stop), while starting trazodone 50mg  qhs and titrating to 2-3 tabs qhs. If no improvement in dizziness after getting totally off zolpidem then will refer to ENT for further e/m (?vestibular testing).     FOLLOW UP:  Return in about 2 weeks (around 01/13/2011) for f/u dizziness.

## 2010-12-30 NOTE — Assessment & Plan Note (Addendum)
I have low suspicion that the left middle ear cavity fluid/haziness seen on the brain MRI is causing her sx's. I see no middle ear fluid or other abnormality today on otoscopy. In review of her med list, I wonder if zolpidem could be causing some of her problems. Will ween off zolpidem over the next 1 wk (1/2 tab qhs x 7d, then stop), while starting trazodone 50mg  qhs and titrating to 2-3 tabs qhs. If no improvement in dizziness after getting totally off zolpidem then will refer to ENT for further e/m (?vestibular testing).

## 2010-12-30 NOTE — Patient Instructions (Signed)
Take 1/2 tab of ambien (zolpidem) at bedtime x 7d, then stop. Start trazodone 50mg , 1 tab by mouth at bedtime x 3d, then 2 tabs by mouth at bedtime.  May increase to 3 tabs at betime once you stop ambien. Recheck in office in 2 wks--PM

## 2010-12-30 NOTE — Telephone Encounter (Signed)
Pls request records from Dr. Nicholas Lose (OB/GYN in [place]).  Thx--PM.

## 2011-01-01 ENCOUNTER — Ambulatory Visit: Payer: BC Managed Care – PPO | Admitting: Neurology

## 2011-01-02 ENCOUNTER — Other Ambulatory Visit: Payer: Self-pay | Admitting: *Deleted

## 2011-01-02 NOTE — Telephone Encounter (Signed)
Pharmacy request for generic ambien--form faxed back to pharmacy with denial of med.  Pt is no longer taking ambien.

## 2011-01-03 ENCOUNTER — Ambulatory Visit
Admission: RE | Admit: 2011-01-03 | Discharge: 2011-01-03 | Disposition: A | Discharge status: Home / Self Care | Payer: BC Managed Care – PPO | Source: Ambulatory Visit | Attending: Gynecology | Admitting: Gynecology

## 2011-01-03 DIAGNOSIS — R928 Other abnormal and inconclusive findings on diagnostic imaging of breast: Secondary | ICD-10-CM

## 2011-01-06 ENCOUNTER — Telehealth: Payer: Self-pay | Admitting: Family Medicine

## 2011-01-06 NOTE — Telephone Encounter (Signed)
Message left on Vm that Dr. Milinda Cave out of office today and we will call her tomorrow with any med changes or further questions.

## 2011-01-06 NOTE — Telephone Encounter (Signed)
Please see how much trazodone she has been taking (how many 50mg  tabs each night).  She needs to fail to respond to the full dose (200mg ) of this med before I try a different med.--PM

## 2011-01-07 NOTE — Telephone Encounter (Signed)
Pt took three tablets (150 mg) on Saturday night.  Still couldn't get to sleep and was agitated.  Took 1/2 of generic Remus Loffler and was able to go to sleep.  Pt has been taking 1/2 generic ambien since then.  Pt would like to restart generic ambien or try something different.  Please advise.

## 2011-01-07 NOTE — Telephone Encounter (Signed)
Tell her to STOP the ambien and take 4 of the 50mg  trazodone 1 hour before bedtime for the next 3 nights and call back if not improved.  Thx--PM

## 2011-01-07 NOTE — Telephone Encounter (Signed)
Call from pt.  Advised to take 4 of the 50mg  trazodone as described below.  Pt should call on Friday if she is not improved.  Pt stated that she was up all night on 3 tabs and was scared to take more.  Advised pt she has to take 4 tabs for this med to be considered a failure.  Pt wants to know if she will be put back on Ambien.  Advised no note to that effect and if she is not improved by Friday to call back.  Pt voices understanding.

## 2011-01-07 NOTE — Telephone Encounter (Signed)
I have attempted to contact this patient by phone with the following results: left message to return my call on answering machine (home).  

## 2011-01-13 ENCOUNTER — Telehealth: Payer: Self-pay | Admitting: Family Medicine

## 2011-01-13 NOTE — Telephone Encounter (Signed)
Patient stopped taking the Trazodone since it wasn't helping her sleep and she felt "drunk". She took ZZZquil OTC medicine & said it worked well

## 2011-01-13 NOTE — Telephone Encounter (Signed)
Verbal from Dr. Esmeralda Arthur, OTC med OK.  RC to pt.  Message left on voicemail to call tomorrow, but OK to take OTC med tonight if it is working.

## 2011-01-14 ENCOUNTER — Ambulatory Visit: Payer: BC Managed Care – PPO | Admitting: Family Medicine

## 2011-01-21 NOTE — Telephone Encounter (Signed)
Done

## 2011-01-22 NOTE — Telephone Encounter (Signed)
No return call from patient.  Ending follow up. 

## 2011-02-18 ENCOUNTER — Telehealth: Payer: Self-pay | Admitting: *Deleted

## 2011-02-18 DIAGNOSIS — E119 Type 2 diabetes mellitus without complications: Secondary | ICD-10-CM

## 2011-02-18 MED ORDER — METFORMIN HCL 500 MG PO TABS: 500.0000 mg | ORAL_TABLET | Freq: Two times a day (BID) | ORAL | Status: DC

## 2011-02-18 NOTE — Telephone Encounter (Signed)
Pt calls.  She is at pharmacy and requesting refill now.  Advised pt to give me a few minutes and I would see what I could get done for her.  Pt also has a question about B12 injections.  Advised I would have to review her chart and return her call later. 1-pt is due for DM follow up in 04/2011.  RX sent until that time.   2-pt should d/c B12 injections and begin OTC B12 supplement.   RC to pt.  Message left to return call.

## 2011-02-19 NOTE — Telephone Encounter (Signed)
RC to pt.  Notified of below.  She will call back to schedule appt.  She will pick up B12 1000 mcg OTC.

## 2011-02-24 ENCOUNTER — Telehealth: Payer: Self-pay | Admitting: Family Medicine

## 2011-02-24 MED ORDER — AMOXICILLIN 500 MG PO TABS: 2000.0000 mg | ORAL_TABLET | Freq: Once | ORAL | Status: AC

## 2011-02-24 NOTE — Telephone Encounter (Signed)
Noted-PM 

## 2011-02-24 NOTE — Telephone Encounter (Signed)
I have attempted to contact this patient by phone with the following results: left message to return my call on answering machine.

## 2011-02-24 NOTE — Telephone Encounter (Signed)
Advised Dr. Milinda Cave that pt has artificial knee.  Verbal order for amoxicillin 2 g x 1 30 minutes prior to dental procedure.  RX faxed through [employer] (internet down) to [facility].

## 2011-03-07 ENCOUNTER — Other Ambulatory Visit: Payer: Self-pay | Admitting: *Deleted

## 2011-03-07 MED ORDER — GLUCOSE BLOOD VI STRP: ORAL_STRIP | Status: DC

## 2011-03-07 NOTE — Telephone Encounter (Signed)
Faxed request from pharmacy.  RX sent.

## 2011-03-20 ENCOUNTER — Encounter: Payer: Self-pay | Admitting: Family Medicine

## 2011-04-10 ENCOUNTER — Encounter: Payer: Self-pay | Admitting: Family Medicine

## 2011-04-10 ENCOUNTER — Ambulatory Visit (INDEPENDENT_AMBULATORY_CARE_PROVIDER_SITE_OTHER): Payer: BC Managed Care – PPO | Admitting: Family Medicine

## 2011-04-10 VITALS — BP 124/84 | HR 73 | Wt 285.0 lb

## 2011-04-10 DIAGNOSIS — J209 Acute bronchitis, unspecified: Secondary | ICD-10-CM

## 2011-04-10 MED ORDER — BENZONATATE 200 MG PO CAPS: 200.0000 mg | ORAL_CAPSULE | Freq: Three times a day (TID) | ORAL | Status: AC | PRN

## 2011-04-10 MED ORDER — METHYLPREDNISOLONE ACETATE PF 40 MG/ML IJ SUSP
40.0000 mg | Freq: Once | INTRAMUSCULAR | Status: AC
  Administered 2011-04-10: 40 mg via INTRAMUSCULAR

## 2011-04-10 NOTE — Progress Notes (Signed)
OFFICE NOTE  04/10/2011  CC:  Chief Complaint  Patient presents with  . Cough    x 4 days  . Sore Throat    x 4 days     HPI: Patient is a 56 y.o. Caucasian female who is here for cough. Pt presents complaining of respiratory symptoms for 4 days.  Mostly nasal congestion/runny nose, sneezing, ST, and PND cough.  Worst symptoms seems to be the cough.  Lately the symptoms seem to be stable.  Has been taking some old amoxil at home the last 3-4 days. Some subjective f/c, but no wheezing and no SOB.  No pain in face or teeth.  No significant HA.  Symptoms made worse by night.  Symptoms improved by nothing. Smoker? No (former, quit >30 yrs ago Recent sick contact? unknown Muscle or joint aches? no Flu shot this season at least 2 wks ago? yes  ROS: no n/v/d or abdominal pain.  No rash.  No neck stiffness.   +Mild fatigue.  +Mild appetite loss.  Glucose 180 this morning, is usually 115 avg.   Pertinent PMH:  Past Medical History  Diagnosis Date  . Insulin resistance   . Hypertension   . Thyroid disease     Hypo; TSH 02/19/10=1.14  . 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 DDD (degenerative disc disease)     Dr. Renae Fickle evaluating  . GERD (gastroesophageal reflux disease)   . Insomnia   . Obesities, morbid     BMI 49.  Gastric stapling surgery in distant past.  . Heart murmur     functional, w/u done  . Asthma   . Peripheral neuropathy   . Carpal tunnel syndrome     bilateral  . Fibromyalgia     questionable  . Nephrolithiasis   . Chronic venous insufficiency     with extensive varicose dz: Dr. Consuela Mimes is managing this.   Past surgical, social, and family history reviewed and no changes noted since last office visit.  Pertinent Meds:  Outpatient Prescriptions Prior to Visit  Medication Sig Dispense Refill  . glucose blood (FREESTYLE LITE) test strip Use as directed.  DX 250.00  100 each  11  . levothyroxine (SYNTHROID, LEVOTHROID) 175  MCG tablet Take 1 tablet (175 mcg total) by mouth daily.  30 tablet  5  . lisinopril (PRINIVIL,ZESTRIL) 40 MG tablet Take 1 tablet (40 mg total) by mouth daily. Must keep 8/14 visit for more refills  30 tablet  5  . metFORMIN (GLUCOPHAGE) 500 MG tablet Take 1 tablet (500 mg total) by mouth 2 (two) times daily with a meal. Must have office visit for more refills  60 tablet  1  . Multiple Vitamins-Minerals (CENTRUM PO) Take 1 tablet by mouth daily.        Marland Kitchen oxyCODONE-acetaminophen (PERCOCET) 10-325 MG per tablet Take 1 tablet by mouth. Up to 5 times daily      . Elastic Bandages & Supports (B-4 MED COMPRESSION HOSE MENS) MISC Wear on legs every day as much as possible.  May go without these while sleeping.  1 each  1  . furosemide (LASIX) 40 MG tablet 1 tab po qd  30 tablet  1  . ALPRAZolam (XANAX) 1 MG tablet Take 1 tablet (1 mg total) by mouth at bedtime as needed for anxiety (take 1-2 mg 30 minutes before MRI.).  3 tablet  0  . Ascorbic Acid (VITAMIN C) 500 MG tablet Take 1-2 tablets  by mouth once daily      . Calcium Citrate-Vitamin D (CITRACAL PETITES/VITAMIN D) 200-250 MG-UNIT TABS Take 1 tablet by mouth daily.        . cyanocobalamin (,VITAMIN B-12,) 1000 MCG/ML injection Inject 1,000 mcg into the muscle every 30 (thirty) days.       . flyticasone (CUTIVATE) 0.005 % ointment Apply to affected areas twice daily as needed  60 g  3     PE: Blood pressure 124/84, pulse 73, weight 285 lb (129.275 kg), SpO2 97.00%. VS: noted--normal. Gen: alert, NAD, NONTOXIC APPEARING. HEENT: eyes without injection, drainage, or swelling.  Ears: EACs clear, TMs with normal light reflex and landmarks.  Nose: Clear rhinorrhea, with some dried, crusty exudate adherent to mildly injected mucosa.  No purulent d/c.  No paranasal sinus TTP.  No facial swelling.  Throat and mouth without focal lesion.  No pharyngial swelling, erythema, or exudate.   Neck: supple, no LAD.   LUNGS: CTA bilat, nonlabored resps.   CV: RRR,  no m/r/g. EXT: no c/c/e SKIN: no rash    IMPRESSION AND PLAN: Laryngotracheobronchitis, viral etiology suspected. Depo Medrol 40mg  qd IM today in office. Tessalon perles 200mg  q8h prn. Stop amoxil.  FOLLOW UP: prn

## 2011-04-13 ENCOUNTER — Encounter: Payer: Self-pay | Admitting: Family Medicine

## 2011-04-14 ENCOUNTER — Telehealth: Payer: Self-pay | Admitting: Family Medicine

## 2011-04-14 MED ORDER — HYDROCODONE-HOMATROPINE 5-1.5 MG/5ML PO SYRP: 5.0000 mL | ORAL_SOLUTION | Freq: Every evening | ORAL | Status: AC | PRN

## 2011-04-14 NOTE — Telephone Encounter (Signed)
RC from pt.  She states she still has cough, that is worse at night.  Pt is having a elevated temps but she is not sure if it is truly a fever or hot flashes.  She was seen last week and given Tessalon Perles and Depo-Medrol injection.  She has stopped tessalon as she believes they were causing her diarrhea.  She continues to take coricidin and robitussin.  Pt needs something to help cough at night so she can sleep.

## 2011-04-14 NOTE — Telephone Encounter (Signed)
Ok to give Hydromet prn, see previous note

## 2011-04-14 NOTE — Telephone Encounter (Signed)
OK to give Hydromet 5 cc po qhs prn cough, 4 oz, no refills

## 2011-04-14 NOTE — Telephone Encounter (Signed)
I have attempted to contact this patient by phone with the following results: left message to return my call on answering machine.

## 2011-04-14 NOTE — Telephone Encounter (Signed)
Patient is having trouble at night, she is coughing all night, is there something that be called in to pharmacy?

## 2011-04-15 NOTE — Telephone Encounter (Signed)
RX faxed to pharm  

## 2011-05-22 ENCOUNTER — Other Ambulatory Visit: Payer: Self-pay | Admitting: *Deleted

## 2011-05-22 DIAGNOSIS — E039 Hypothyroidism, unspecified: Secondary | ICD-10-CM

## 2011-05-22 DIAGNOSIS — I10 Essential (primary) hypertension: Secondary | ICD-10-CM

## 2011-05-22 DIAGNOSIS — E119 Type 2 diabetes mellitus without complications: Secondary | ICD-10-CM

## 2011-05-22 MED ORDER — METFORMIN HCL 500 MG PO TABS: 500.0000 mg | ORAL_TABLET | Freq: Two times a day (BID) | ORAL | Status: DC

## 2011-05-22 MED ORDER — LEVOTHYROXINE SODIUM 175 MCG PO TABS: 175.0000 ug | ORAL_TABLET | Freq: Every day | ORAL | Status: DC

## 2011-05-22 MED ORDER — LISINOPRIL 40 MG PO TABS: 40.0000 mg | ORAL_TABLET | Freq: Every day | ORAL | Status: DC

## 2011-05-22 NOTE — Telephone Encounter (Signed)
Printed 180 day rx's for all three (metformin, lisinopril, and levothyroxine) with no RFs.  Pt needs routine medication management/Diabetes/hypothyroidism f/u in 68mo regardless of insurance status.  Thx--PM

## 2011-05-22 NOTE — Telephone Encounter (Signed)
VM left by patient stating she needs 90 day refill on medication for indigent program.  Pt did not specify drug name.  I have attempted to contact this patient by phone with the following results: left message to return my call on answering machine.

## 2011-05-22 NOTE — Telephone Encounter (Signed)
Pt husband picked up RX.  Advised Robert that Temia MUST have office visit in six months for any additional refills.  NO EXCEPTIONS.  He voiced good understanding.

## 2011-05-22 NOTE — Telephone Encounter (Signed)
Pt does not have insurance at this time.  She needs 90 day written RX, 180 day would be better to receive better price.  Please advise next office visit/lab visit.  Thanks.

## 2011-05-22 NOTE — Telephone Encounter (Signed)
Message left stating RX's ready to be picked up.

## 2011-06-06 ENCOUNTER — Other Ambulatory Visit: Payer: Self-pay | Admitting: *Deleted

## 2011-06-06 DIAGNOSIS — I10 Essential (primary) hypertension: Secondary | ICD-10-CM

## 2011-06-06 DIAGNOSIS — E039 Hypothyroidism, unspecified: Secondary | ICD-10-CM

## 2011-06-06 MED ORDER — LISINOPRIL 40 MG PO TABS: 40.0000 mg | ORAL_TABLET | Freq: Every day | ORAL | Status: DC

## 2011-06-06 MED ORDER — LEVOTHYROXINE SODIUM 175 MCG PO TABS: 175.0000 ug | ORAL_TABLET | Freq: Every day | ORAL | Status: DC

## 2011-06-06 NOTE — Telephone Encounter (Signed)
15 day supply sent to local pharmacy.  

## 2011-11-01 ENCOUNTER — Emergency Department (HOSPITAL_BASED_OUTPATIENT_CLINIC_OR_DEPARTMENT_OTHER)
Admission: EM | Admit: 2011-11-01 | Discharge: 2011-11-01 | Disposition: A | Discharge status: Home / Self Care | Payer: Self-pay | Attending: Emergency Medicine | Admitting: Emergency Medicine

## 2011-11-01 ENCOUNTER — Emergency Department (HOSPITAL_BASED_OUTPATIENT_CLINIC_OR_DEPARTMENT_OTHER): Payer: Self-pay

## 2011-11-01 ENCOUNTER — Encounter (HOSPITAL_BASED_OUTPATIENT_CLINIC_OR_DEPARTMENT_OTHER): Payer: Self-pay | Admitting: *Deleted

## 2011-11-01 DIAGNOSIS — T148XXA Other injury of unspecified body region, initial encounter: Secondary | ICD-10-CM

## 2011-11-01 DIAGNOSIS — IMO0001 Reserved for inherently not codable concepts without codable children: Secondary | ICD-10-CM | POA: Insufficient documentation

## 2011-11-01 DIAGNOSIS — S022XXA Fracture of nasal bones, initial encounter for closed fracture: Secondary | ICD-10-CM | POA: Insufficient documentation

## 2011-11-01 DIAGNOSIS — Z8739 Personal history of other diseases of the musculoskeletal system and connective tissue: Secondary | ICD-10-CM | POA: Insufficient documentation

## 2011-11-01 DIAGNOSIS — E039 Hypothyroidism, unspecified: Secondary | ICD-10-CM | POA: Insufficient documentation

## 2011-11-01 DIAGNOSIS — J45909 Unspecified asthma, uncomplicated: Secondary | ICD-10-CM | POA: Insufficient documentation

## 2011-11-01 DIAGNOSIS — Y9289 Other specified places as the place of occurrence of the external cause: Secondary | ICD-10-CM | POA: Insufficient documentation

## 2011-11-01 DIAGNOSIS — Z87891 Personal history of nicotine dependence: Secondary | ICD-10-CM | POA: Insufficient documentation

## 2011-11-01 DIAGNOSIS — K219 Gastro-esophageal reflux disease without esophagitis: Secondary | ICD-10-CM | POA: Insufficient documentation

## 2011-11-01 DIAGNOSIS — Z79899 Other long term (current) drug therapy: Secondary | ICD-10-CM | POA: Insufficient documentation

## 2011-11-01 DIAGNOSIS — W19XXXA Unspecified fall, initial encounter: Secondary | ICD-10-CM | POA: Insufficient documentation

## 2011-11-01 DIAGNOSIS — Z9089 Acquired absence of other organs: Secondary | ICD-10-CM | POA: Insufficient documentation
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
## 2011-11-01 NOTE — ED Provider Notes (Signed)
Medical screening examination/treatment/procedure(s) were performed by non-physician practitioner and as supervising physician I was immediately available for consultation/collaboration.   Carleene Cooper III, MD 11/01/11 [id number]

## 2011-11-01 NOTE — ED Notes (Signed)
Pt d/c with family- caox 4
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
## 2011-11-07 ENCOUNTER — Other Ambulatory Visit: Payer: Self-pay | Admitting: Family Medicine

## 2011-11-07 NOTE — Telephone Encounter (Signed)
I have attempted to contact this patient by phone with the following results: left message to return my call on answering machine (home).  

## 2011-11-10 ENCOUNTER — Ambulatory Visit (INDEPENDENT_AMBULATORY_CARE_PROVIDER_SITE_OTHER): Payer: Self-pay | Admitting: Family Medicine

## 2011-11-10 ENCOUNTER — Encounter: Payer: Self-pay | Admitting: Family Medicine

## 2011-11-10 VITALS — BP 110/73 | HR 70 | Temp 97.0°F | Ht 64.5 in | Wt 292.4 lb

## 2011-11-10 DIAGNOSIS — I10 Essential (primary) hypertension: Secondary | ICD-10-CM

## 2011-11-10 DIAGNOSIS — E538 Deficiency of other specified B group vitamins: Secondary | ICD-10-CM

## 2011-11-10 DIAGNOSIS — E039 Hypothyroidism, unspecified: Secondary | ICD-10-CM

## 2011-11-10 DIAGNOSIS — E119 Type 2 diabetes mellitus without complications: Secondary | ICD-10-CM

## 2011-11-10 LAB — TSH: TSH: 1.53 u[IU]/mL (ref 0.35–5.50)

## 2011-11-10 NOTE — Progress Notes (Signed)
OFFICE VISIT  11/10/2011   CC:  Chief Complaint  Patient presents with  . Medication Refill    3 different meds,  B12 level check     HPI:    Patient is a 56 y.o. Caucasian female who presents for routine f/u HTN, DM 2, obesity, hypothyroidism. Hasn't had chronic illness f/u in 10 mo.  I asked her to come in for recheck when she recently called asking for 90 day supplies of all her meds. Says fasting glucoses 90-130s, checks once per day. Says dizziness problem seems to have leveled out and she only occasionally has some lightheaded feeling. She asks about vit B12, says she felt better on it, asks for level check today, has not been able to afford getting vit B12 injections. Financial probs: no health insurance.  ROS: mild fatigue, mild generalized leg weakness with walking.    Past Medical History  Diagnosis Date  . Insulin resistance   . Hypertension   . Hypothyroidism   . 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 DDD (degenerative disc disease)     Dr. Renae Fickle evaluated her and recommended pain mgmt MD.  She now sees Dr. Oneal Grout for pain mgmt.  Marland Kitchen GERD (gastroesophageal reflux disease)   . Insomnia   . Morbid obesity     BMI 49.  Gastric stapling surgery in distant past.  . Heart murmur     functional, w/u done  . Asthma   . Peripheral neuropathy   . Carpal tunnel syndrome     bilateral  . Fibromyalgia     questionable  . Nephrolithiasis   . Chronic venous insufficiency     with edema  and extensive varicose dz: Dr. Consuela Mimes is managing this, planning laser procedures.  . Disequilibrium syndrome     MRI brain normal 2012    Past Surgical History  Procedure Date  . Cholecystectomy 1987  . Joint replacement 2009    Right Knee, [facility]  . Foot surgery 2000    Left tarsel tunnel release  . Hernia repair  1980's    Diaphragmatic + gastric stapling  . Hernia repair     Inguinal  . Extracorporeal shock wave lithotripsy   .  Endometrial ablation     menorrhagia    Outpatient Prescriptions Prior to Visit  Medication Sig Dispense Refill  . glucose blood (FREESTYLE LITE) test strip Use as directed.  DX 250.00  100 each  11  . levothyroxine (SYNTHROID, LEVOTHROID) 175 MCG tablet Take 1 tablet (175 mcg total) by mouth daily.  15 tablet  0  . lisinopril (PRINIVIL,ZESTRIL) 40 MG tablet Take 1 tablet (40 mg total) by mouth daily. Must keep 8/14 visit for more refills  15 tablet  0  . metFORMIN (GLUCOPHAGE) 500 MG tablet Take 1 tablet (500 mg total) by mouth 2 (two) times daily with a meal. Must have office visit for more refills  360 tablet  0  . Multiple Vitamins-Minerals (CENTRUM PO) Take 1 tablet by mouth daily.        Marland Kitchen oxyCODONE-acetaminophen (PERCOCET) 10-325 MG per tablet Take 1 tablet by mouth. Up to 5 times daily      . cyanocobalamin 1000 MCG tablet Take 1,000 mcg by mouth daily.      Marland Kitchen Dextromethorphan-Guaifenesin (CORICIDIN HBP CONGESTION/COUGH PO) Take by mouth as needed.      Marland Kitchen Dextromethorphan-Guaifenesin (ROBITUSSIN DM PO) Take by mouth.      . DiphenhydrAMINE HCl, Sleep, (  ZZZQUIL) 25 MG CAPS Take 1 tablet by mouth at bedtime.      . Elastic Bandages & Supports (B-4 MED COMPRESSION HOSE MENS) MISC Wear on legs every day as much as possible.  May go without these while sleeping.  1 each  1  . furosemide (LASIX) 40 MG tablet 1 tab po qd  30 tablet  1    Allergies  Allergen Reactions  . Codeine     REACTION: unknown  . Morphine     REACTION: unknown    ROS As per HPI  PE: Blood pressure 110/73, pulse 70, temperature 97 F (36.1 C), height 5' 4.5" (1.638 m), weight 292 lb 6.4 oz (132.632 kg), SpO2 97.00%. Gen: Alert, well appearing, obese white female.  Patient is oriented to person, place, time, and situation. CV: RRR, no m/r/g.   LUNGS: CTA bilat, nonlabored resps, good aeration in all lung fields. EXT: doughy edema palpable in bilat LEs but no pitting.   LABS:  None today  IMPRESSION AND  PLAN:  HYPOTHYROIDISM Lab Results  Component Value Date   TSH 1.824 12/03/2010   Due for TSH today.  ESSENTIAL HYPERTENSION Problem stable.  Continue current medications and diet appropriate for this condition.  We have reviewed our general long term plan for this problem and also reviewed symptoms and signs that should prompt the patient to call or return to the office.   DIAB W/O COMP TYPE II/UNS NOT STATED UNCNTRL According to her minimal home monitoring, her control is fine. Due to financial constraints, she declines the usual monitoring for this condition (HbA1c, urine microalb, eye screening, pneumovax). Will go ahead and RF metformin x 6 mo.  B12 DEFICIENCY Hx of borderline vit B12 deficiency, felt better when getting consistent IM vit B12. Recheck Vit B 12 level today.    FOLLOW UP: Return in about 6 months (around 05/12/2012) for f/u chronic illness.

## 2011-11-10 NOTE — Assessment & Plan Note (Signed)
Hx of borderline vit B12 deficiency, felt better when getting consistent IM vit B12. Recheck Vit B 12 level today.

## 2011-11-10 NOTE — Telephone Encounter (Signed)
Pt came in today for an appt 

## 2011-11-10 NOTE — Assessment & Plan Note (Signed)
Lab Results  Component Value Date   TSH 1.824 12/03/2010   Due for TSH today.

## 2011-11-10 NOTE — Assessment & Plan Note (Signed)
Problem stable.  Continue current medications and diet appropriate for this condition.  We have reviewed our general long term plan for this problem and also reviewed symptoms and signs that should prompt the patient to call or return to the office.  

## 2011-11-10 NOTE — Assessment & Plan Note (Signed)
According to her minimal home monitoring, her control is fine. Due to financial constraints, she declines the usual monitoring for this condition (HbA1c, urine microalb, eye screening, pneumovax). Will go ahead and RF metformin x 6 mo.

## 2011-11-11 ENCOUNTER — Telehealth: Payer: Self-pay | Admitting: *Deleted

## 2011-11-11 MED ORDER — LEVOTHYROXINE SODIUM 175 MCG PO TABS: 175.0000 ug | ORAL_TABLET | Freq: Every day | ORAL | Status: DC

## 2011-11-11 MED ORDER — LISINOPRIL 40 MG PO TABS: 40.0000 mg | ORAL_TABLET | Freq: Every day | ORAL | Status: DC

## 2011-11-11 MED ORDER — METFORMIN HCL 500 MG PO TABS: 500.0000 mg | ORAL_TABLET | Freq: Two times a day (BID) | ORAL | Status: DC

## 2011-11-11 NOTE — Telephone Encounter (Signed)
Advised patient by voice mail, left a message for her that her labs were normal, thyroid and vit B12 level.  We also faxed 3 prescriptions for her, the metformin, the Lisinopril and the Levothyroxine.

## 2011-11-11 NOTE — Telephone Encounter (Signed)
Message copied by Derry Skill on Tue Nov 11, 2011  9:06 AM ------      Message from: Jeoffrey Massed      Created: Tue Nov 11, 2011  8:26 AM       Pls notify pt that labs yesterday were normal (thyroid and vit B12 level).  I printed out 3 rx's for her meds to be faxed to [phone].--thx

## 2011-11-11 NOTE — Addendum Note (Signed)
Addended by: Jeoffrey Massed on: 11/11/2011 08:24 AM   Modules accepted: Orders

## 2011-11-14 ENCOUNTER — Telehealth: Payer: Self-pay | Admitting: Family Medicine

## 2011-11-14 NOTE — Telephone Encounter (Signed)
error 

## 2012-02-16 ENCOUNTER — Other Ambulatory Visit: Payer: Self-pay | Admitting: Gynecology

## 2012-03-31 DIAGNOSIS — R3129 Other microscopic hematuria: Secondary | ICD-10-CM

## 2012-03-31 HISTORY — DX: Other microscopic hematuria: R31.29

## 2012-05-26 ENCOUNTER — Telehealth: Payer: Self-pay | Admitting: Family Medicine

## 2012-05-27 NOTE — Telephone Encounter (Signed)
I have attempted to contact this patient by phone with the following results: left message to return my call on answering machine (home).  

## 2012-05-28 NOTE — Telephone Encounter (Signed)
Pt has appt on Monday.  She has enough meds to last the weekend.  Pt would like to pick up printed RX today so she can get them in the mail.  She will need 2 week supply called to local pharmacy. Advised pt she will have to keep appt on Monday to get these refills.  Is it OK to print RX's today or wait until Monday?

## 2012-05-28 NOTE — Telephone Encounter (Signed)
Needs to wait until I see her to get any rx's.

## 2012-05-28 NOTE — Telephone Encounter (Signed)
Pt ntoified we will fill on Monday.

## 2012-05-31 ENCOUNTER — Ambulatory Visit (INDEPENDENT_AMBULATORY_CARE_PROVIDER_SITE_OTHER): Payer: No Typology Code available for payment source | Admitting: Family Medicine

## 2012-05-31 ENCOUNTER — Encounter: Payer: Self-pay | Admitting: Family Medicine

## 2012-05-31 VITALS — BP 110/78 | HR 72 | Temp 98.1°F | Ht 64.5 in | Wt 295.0 lb

## 2012-05-31 DIAGNOSIS — E039 Hypothyroidism, unspecified: Secondary | ICD-10-CM

## 2012-05-31 DIAGNOSIS — E538 Deficiency of other specified B group vitamins: Secondary | ICD-10-CM

## 2012-05-31 DIAGNOSIS — E119 Type 2 diabetes mellitus without complications: Secondary | ICD-10-CM

## 2012-05-31 DIAGNOSIS — R5381 Other malaise: Secondary | ICD-10-CM | POA: Insufficient documentation

## 2012-05-31 DIAGNOSIS — G8929 Other chronic pain: Secondary | ICD-10-CM

## 2012-05-31 LAB — COMPREHENSIVE METABOLIC PANEL
Albumin: 4.4 g/dL (ref 3.5–5.2)
CO2: 27 mEq/L (ref 19–32)
Calcium: 9.8 mg/dL (ref 8.4–10.5)
Glucose, Bld: 101 mg/dL — ABNORMAL HIGH (ref 70–99)
Sodium: 136 mEq/L (ref 135–145)
Total Bilirubin: 0.4 mg/dL (ref 0.3–1.2)
Total Protein: 6.6 g/dL (ref 6.0–8.3)

## 2012-05-31 LAB — POCT URINALYSIS DIPSTICK
Spec Grav, UA: 1.025
Urobilinogen, UA: 0.2

## 2012-05-31 LAB — CBC WITH DIFFERENTIAL/PLATELET
Lymphocytes Relative: 37 % (ref 12–46)
Lymphs Abs: 2.2 10*3/uL (ref 0.7–4.0)
MCV: 87.9 fL (ref 78.0–100.0)
Neutro Abs: 2.8 10*3/uL (ref 1.7–7.7)
Neutrophils Relative %: 48 % (ref 43–77)
Platelets: 322 10*3/uL (ref 150–400)
RBC: 4.38 MIL/uL (ref 3.87–5.11)
WBC: 5.8 10*3/uL (ref 4.0–10.5)

## 2012-05-31 LAB — TSH: TSH: 1.68 u[IU]/mL (ref 0.350–4.500)

## 2012-05-31 LAB — SEDIMENTATION RATE: Sed Rate: 5 mm/hr (ref 0–22)

## 2012-05-31 MED ORDER — LEVOTHYROXINE SODIUM 175 MCG PO TABS: 175.0000 ug | ORAL_TABLET | Freq: Every day | ORAL | Status: DC

## 2012-05-31 MED ORDER — LISINOPRIL 40 MG PO TABS: 40.0000 mg | ORAL_TABLET | Freq: Every day | ORAL | Status: DC

## 2012-05-31 NOTE — Assessment & Plan Note (Signed)
Has been off of B12 injections.  Pt not feeling the same on oral supplement. Will check B12 level today and if borderline low or low then will restart monthly vit B12 injections IM.

## 2012-05-31 NOTE — Assessment & Plan Note (Addendum)
Essentially has chronic full-body tenderness c/w fibromyalgia.  However, will try to add on a CPK total to today's blood work.  No specific treatments for this at this time.  We are currently focusing on other aspects of her health.

## 2012-05-31 NOTE — Assessment & Plan Note (Signed)
Not monitoring glucoses.  Encouraged pt to do so, especially in light of possible increased urination secondary to hyperglycemia/poor control. Hba1c today. D.R screening negative 12/03/10 ([facility]).  She is due, but we didn't discuss this today so I'll remind her next f/u. She certainly is noncompliant with necessary diet and exercise.

## 2012-05-31 NOTE — Progress Notes (Signed)
OFFICE NOTE  05/31/2012  CC:  Chief Complaint  Patient presents with  . Follow-up    Meds/HTN/hyperthyroid     HPI: Patient is a 58 y.o. Caucasian female who is here for 6 mo f/u HTN, hypothyroidism, obesity, and insulin resistance. Between financial constraints and moving around a lot she has not had a HbA1c in about 35mo. Not monitoring glucose.  Complains of nocturia (q1 hr).  Some increased urination during daytime.  Has only been taking the metformin once a day.  No dysuria.  No hematuria.  +daytime urgency. Not limiting calories in any significant way and not limiting sodium. Oral (liquid) vit B12 "not helping" any.  Says "legs feel like noodles".  Says she feels like the vitamin B12 IM was making her feel better.    Complains a lot of generalized weakness/malaise.  "Tender all over"  This really is not anything new for her--complains of this every time I see her.   Pertinent PMH:  Past Medical History  Diagnosis Date  . Insulin resistance   . Hypertension   . Hypothyroidism   . 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 DDD (degenerative disc disease)     Dr. Renae Fickle evaluated her and recommended pain mgmt MD.  She now sees Dr. Oneal Grout for pain mgmt.  Marland Kitchen GERD (gastroesophageal reflux disease)   . Insomnia   . Morbid obesity     BMI 49.  Gastric stapling surgery in distant past.  . Heart murmur     functional, w/u done  . Asthma   . Peripheral neuropathy   . Carpal tunnel syndrome     bilateral  . Fibromyalgia     questionable  . Nephrolithiasis   . Chronic venous insufficiency     with edema  and extensive varicose dz: Dr. Consuela Mimes is managing this, planning laser procedures.  . Disequilibrium syndrome     MRI brain normal 2012    MEDS:  Not taking furosemide listed below. Outpatient Prescriptions Prior to Visit  Medication Sig Dispense Refill  . glucose blood (FREESTYLE LITE) test strip Use as directed.  DX 250.00  100 each  11  .  levothyroxine (SYNTHROID, LEVOTHROID) 175 MCG tablet Take 1 tablet (175 mcg total) by mouth daily.  90 tablet  1  . lisinopril (PRINIVIL,ZESTRIL) 40 MG tablet Take 1 tablet (40 mg total) by mouth daily. Must keep 8/14 visit for more refills  90 tablet  1  . metFORMIN (GLUCOPHAGE) 500 MG tablet Take 1 tablet (500 mg total) by mouth 2 (two) times daily with a meal.  240 tablet  1  . oxyCODONE-acetaminophen (PERCOCET) 10-325 MG per tablet Take 1 tablet by mouth. Up to 5 times daily      . cyanocobalamin 1000 MCG tablet Take 1,000 mcg by mouth daily.      Marland Kitchen Dextromethorphan-Guaifenesin (CORICIDIN HBP CONGESTION/COUGH PO) Take by mouth as needed.      Marland Kitchen Dextromethorphan-Guaifenesin (ROBITUSSIN DM PO) Take by mouth.      . DiphenhydrAMINE HCl, Sleep, (ZZZQUIL) 25 MG CAPS Take 1 tablet by mouth at bedtime.      . Elastic Bandages & Supports (B-4 MED COMPRESSION HOSE MENS) MISC Wear on legs every day as much as possible.  May go without these while sleeping.  1 each  1  . furosemide (LASIX) 40 MG tablet 1 tab po qd  30 tablet  1  . Multiple Vitamins-Minerals (CENTRUM PO) Take 1 tablet by mouth daily.  No facility-administered medications prior to visit.    PE: Blood pressure 110/78, pulse 72, temperature 98.1 F (36.7 C), temperature source Temporal, height 5' 4.5" (1.638 m), weight 295 lb (133.811 kg). Gen: Alert, well appearing, obese white female.  Patient is oriented to person, place, time, and situation. AFFECT: pleasant, lucid thought and speech. ENT:  Eyes: no injection, icteris, swelling, or exudate.  EOMI, PERRLA. Nose: no drainage or turbinate edema/swelling.  No injection or focal lesion.  Mouth: lips without lesion/swelling.  Oral mucosa pink and moist.  Oropharynx without erythema, exudate, or swelling.  CV: RRR, no m/r/g.   LUNGS: CTA bilat, nonlabored resps, good aeration in all lung fields. ABD: soft, NT/ND  LAB:  CC UA today showed trace intact blood, trace protein, and  trace leukocyte esterase.  IMPRESSION AND PLAN:  Insulin resistance Not monitoring glucoses.  Encouraged pt to do so, especially in light of possible increased urination secondary to hyperglycemia/poor control. Hba1c today. D.R screening negative 12/03/10 ([facility]).  She is due, but we didn't discuss this today so I'll remind her next f/u. She certainly is noncompliant with necessary diet and exercise.   ESSENTIAL HYPERTENSION Problem stable.  Continue current medications and diet appropriate for this condition.  We have reviewed our general long term plan for this problem and also reviewed symptoms and signs that should prompt the patient to call or return to the office.   B12 DEFICIENCY Has been off of B12 injections.  Pt not feeling the same on oral supplement. Will check B12 level today and if borderline low or low then will restart monthly vit B12 injections IM.  Frequency of urination Primarily nocturia. Suspect poor control of glucoses is leading to glucosuria, although urine dipstick neg for gluc today. UA not showing convincing signs of infection, although urine was sent for culture.  Given longevity of this symptom, it makes UTI less likely. Urge incontinence is a definite possibility.   Other malaise and fatigue Unfortunately this is a prominent complaint every time I see her, yet I've not been able to find much objective evidence to support and diagnosis to explain her symptoms.  Fibromyalgia is the most likely diagnosis.  Give proximal muscle weakness will go ahead and check ESR to try to r/o PMR.  I suspect all of her fatigue and generalized weakness is secondary to her obesity, sedentary lifestyle, and marked deconditioning. Checking TSH, CBC, and vit B12 levels today.  Chronic pain Essentially has chronic full-body tenderness c/w fibromyalgia.  However, will try to add on a CPK total to today's blood work.  No specific treatments for this at this time.  We  are currently focusing on other aspects of her health.   An After Visit Summary was printed and given to the patient.  FOLLOW UP:  69mo

## 2012-05-31 NOTE — Assessment & Plan Note (Addendum)
Primarily nocturia. Suspect poor control of glucoses is leading to glucosuria, although urine dipstick neg for gluc today. UA not showing convincing signs of infection, although urine was sent for culture.  Given longevity of this symptom, it makes UTI less likely. Urge incontinence is a definite possibility.

## 2012-05-31 NOTE — Assessment & Plan Note (Signed)
Unfortunately this is a prominent complaint every time I see her, yet I've not been able to find much objective evidence to support and diagnosis to explain her symptoms.  Fibromyalgia is the most likely diagnosis.  Give proximal muscle weakness will go ahead and check ESR to try to r/o PMR.  I suspect all of her fatigue and generalized weakness is secondary to her obesity, sedentary lifestyle, and marked deconditioning. Checking TSH, CBC, and vit B12 levels today.

## 2012-05-31 NOTE — Assessment & Plan Note (Signed)
Problem stable.  Continue current medications and diet appropriate for this condition.  We have reviewed our general long term plan for this problem and also reviewed symptoms and signs that should prompt the patient to call or return to the office.  

## 2012-06-01 ENCOUNTER — Other Ambulatory Visit: Payer: Self-pay | Admitting: *Deleted

## 2012-06-01 LAB — URINE CULTURE: Colony Count: 9000

## 2012-06-01 MED ORDER — LEVOTHYROXINE SODIUM 175 MCG PO TABS: 175.0000 ug | ORAL_TABLET | Freq: Every day | ORAL | Status: DC

## 2012-06-01 MED ORDER — LISINOPRIL 40 MG PO TABS: 40.0000 mg | ORAL_TABLET | Freq: Every day | ORAL | Status: DC

## 2012-06-01 NOTE — Telephone Encounter (Signed)
Pt needs 2 week supply of meds until mail order meds arrive.  RX sent.

## 2012-06-02 ENCOUNTER — Telehealth: Payer: Self-pay | Admitting: Family Medicine

## 2012-06-02 NOTE — Telephone Encounter (Signed)
Patient is calling in for test results.

## 2012-06-24 NOTE — Telephone Encounter (Signed)
Notes Recorded by Luisa Dago, CMA on 06/03/2012 at 11:15 AM Pt notified of results. Pt voices understanding and is agreeable with plan.  Notes Recorded by Jeoffrey Massed, MD on 06/02/2012 at 5:57 PM Pls notify pt that all labs were normal. Her HbA1c was 6.1%, which is fairly stable, no change in metformin needed. All other blood tests normal. Urine culture was negative.-thx

## 2012-07-23 ENCOUNTER — Telehealth: Payer: Self-pay | Admitting: Family Medicine

## 2012-07-23 NOTE — Telephone Encounter (Signed)
Last Rx for Cyanocobalamin was 10.01.2012 Last Rx showing 10.01.2012, last B12 lab showing 08.12.13 @ 534; pt had 03.04.14 for compliance & medications, labs done but not B12/SLS Please advise.

## 2012-07-23 NOTE — Telephone Encounter (Signed)
Please send Rx for B12 to [facility] on [address]

## 2012-07-25 NOTE — Telephone Encounter (Signed)
Does pt want rx to get vit b12 level done or does she want rx for vit b12 injections? If wants vit B12 level done, then she must come by here for lab visit and get it done.  If she wants vit B12 injections rx'd then I 'll need her to get vit B12 level checked anyway b/c last vit b12 level was wnl.  Let me know.-thx

## 2012-07-26 NOTE — Telephone Encounter (Signed)
LMOM with contact name and number for return call RE: lab visit needed for B12and further provider instructions/SLS  

## 2012-08-02 NOTE — Telephone Encounter (Signed)
LMOM with contact name and number for return call RE: lab visit needed for B12and further provider instructions/SLS

## 2012-08-09 ENCOUNTER — Encounter: Payer: Self-pay | Admitting: *Deleted

## 2012-08-12 ENCOUNTER — Encounter: Payer: Self-pay | Admitting: *Deleted

## 2012-08-12 NOTE — Telephone Encounter (Signed)
Letter Mailed/SLS  

## 2012-10-07 ENCOUNTER — Other Ambulatory Visit: Payer: Self-pay

## 2012-10-07 DIAGNOSIS — Z1231 Encounter for screening mammogram for malignant neoplasm of breast: Secondary | ICD-10-CM

## 2012-10-22 ENCOUNTER — Ambulatory Visit
Admission: RE | Admit: 2012-10-22 | Discharge: 2012-10-22 | Disposition: A | Discharge status: Home / Self Care | Payer: No Typology Code available for payment source | Source: Ambulatory Visit

## 2012-10-22 DIAGNOSIS — Z1231 Encounter for screening mammogram for malignant neoplasm of breast: Secondary | ICD-10-CM

## 2012-10-27 ENCOUNTER — Ambulatory Visit: Payer: No Typology Code available for payment source | Admitting: Family Medicine

## 2012-10-28 ENCOUNTER — Encounter: Payer: Self-pay | Admitting: Family Medicine

## 2012-10-28 ENCOUNTER — Ambulatory Visit (INDEPENDENT_AMBULATORY_CARE_PROVIDER_SITE_OTHER): Payer: No Typology Code available for payment source | Admitting: Family Medicine

## 2012-10-28 VITALS — BP 130/100 | HR 82 | Temp 98.0°F | Ht 64.5 in | Wt 293.0 lb

## 2012-10-28 DIAGNOSIS — E119 Type 2 diabetes mellitus without complications: Secondary | ICD-10-CM

## 2012-10-28 DIAGNOSIS — I10 Essential (primary) hypertension: Secondary | ICD-10-CM

## 2012-10-28 DIAGNOSIS — N3941 Urge incontinence: Secondary | ICD-10-CM

## 2012-10-28 DIAGNOSIS — Z23 Encounter for immunization: Secondary | ICD-10-CM

## 2012-10-28 DIAGNOSIS — R350 Frequency of micturition: Secondary | ICD-10-CM

## 2012-10-28 DIAGNOSIS — E039 Hypothyroidism, unspecified: Secondary | ICD-10-CM

## 2012-10-28 DIAGNOSIS — E538 Deficiency of other specified B group vitamins: Secondary | ICD-10-CM

## 2012-10-28 LAB — URINALYSIS, ROUTINE W REFLEX MICROSCOPIC
Bilirubin Urine: NEGATIVE
Hgb urine dipstick: NEGATIVE
Nitrite: NEGATIVE
Urobilinogen, UA: 0.2 (ref 0.0–1.0)
pH: 6 (ref 5.0–8.0)

## 2012-10-28 LAB — BASIC METABOLIC PANEL
BUN: 19 mg/dL (ref 6–23)
CO2: 29 mEq/L (ref 19–32)
Calcium: 9.7 mg/dL (ref 8.4–10.5)
Creatinine, Ser: 0.7 mg/dL (ref 0.4–1.2)
Glucose, Bld: 101 mg/dL — ABNORMAL HIGH (ref 70–99)

## 2012-10-28 LAB — VITAMIN B12: Vitamin B-12: 795 pg/mL (ref 211–911)

## 2012-10-28 MED ORDER — OXYBUTYNIN CHLORIDE ER 5 MG PO TB24: 5.0000 mg | ORAL_TABLET | Freq: Every day | ORAL | Status: DC

## 2012-10-28 MED ORDER — LISINOPRIL 40 MG PO TABS: 40.0000 mg | ORAL_TABLET | Freq: Every day | ORAL | Status: DC

## 2012-10-28 MED ORDER — METFORMIN HCL 500 MG PO TABS: 500.0000 mg | ORAL_TABLET | Freq: Two times a day (BID) | ORAL | Status: DC

## 2012-10-28 MED ORDER — LEVOTHYROXINE SODIUM 175 MCG PO TABS: 175.0000 ug | ORAL_TABLET | Freq: Every day | ORAL | Status: DC

## 2012-10-28 MED ORDER — SOLIFENACIN SUCCINATE 5 MG PO TABS: ORAL_TABLET | ORAL | Status: DC

## 2012-10-28 NOTE — Progress Notes (Addendum)
OFFICE NOTE  11/29/2012  CC:  Chief Complaint  Patient presents with  . Urinary Frequency     HPI: Patient is a 57 y.o. Caucasian female who is here for urinary frequency.  I last saw her about 4 mo and she had the same complaint--her urine clx was neg and her UA showed no glucose and her HbA1c was 6.1%. Ongoing for about the last 6-8 mo, says she gets up in the night every hour and it is getting to be more of a problem in daytime as well.  Volume of urine varies from a dribble to normal emptying.  Feels suprapubic pressure frequently.  Has some urge incontinence.  No signif stress incontinence.  No dysuria or suprapubic/pelvic pain.  Urine color sometimes darker and sometimes lighter, sometimes a smell to her urine.  She says she was treated for "overactive bladder" in the distant past by some sort of bladder infiltration.  This condition apparently got better "years ago" and just resurfaced the last 6-8 mo.  Not checking sugars lately: last time was a few weeks ago (random) was "in the 100s".  Currently takes 500mg  metformin once a day, occ twice a day. Bowels: has BM twice a week and she doesn't feel like constipation is an issue.  Patient last ate 3 hours ago: mandarin oranges.   Pertinent PMH:  Past Medical History  Diagnosis Date  . Insulin resistance   . Hypertension   . Hypothyroidism   . 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 DDD (degenerative disc disease)     Dr. Renae Fickle evaluated her and recommended pain mgmt MD.  She saw Dr. Oneal Grout for pain mgmt at one time.  Marland Kitchen GERD (gastroesophageal reflux disease)   . Insomnia   . Morbid obesity     BMI 49.  Gastric stapling surgery in distant past.  . Heart murmur     functional, w/u done  . Asthma   . Peripheral neuropathy     No old records to confirm pt's report.  Pt refuses to have more NCS b/c test is painful.  . Carpal tunnel syndrome     bilateral  . Fibromyalgia     questionable  . Nephrolithiasis      Most recent was approx 2002 (lithotripsy was done)  . Chronic venous insufficiency     with edema  and extensive varicose dz: Dr. Consuela Mimes is managing this, planning laser procedures.  . Disequilibrium syndrome     MRI brain normal 2012  . DIZZINESS 04/12/2010    Qualifier: Diagnosis of  By: Milinda Cave M.D., Elizebeth Brooking   . Carpal tunnel syndrome on both sides 06/21/2010   Past surgical, social, and family history reviewed and no changes noted since last office visit.  MEDS:  Outpatient Prescriptions Prior to Visit  Medication Sig Dispense Refill  . glucose blood (FREESTYLE LITE) test strip Use as directed.  DX 250.00  100 each  11  . oxyCODONE-acetaminophen (PERCOCET) 10-325 MG per tablet Take 1 tablet by mouth. Up to 5 times daily      . levothyroxine (SYNTHROID, LEVOTHROID) 175 MCG tablet Take 1 tablet (175 mcg total) by mouth daily.  14 tablet  0  . lisinopril (PRINIVIL,ZESTRIL) 40 MG tablet Take 1 tablet (40 mg total) by mouth daily.  14 tablet  0  . metFORMIN (GLUCOPHAGE) 500 MG tablet Take 1 tablet (500 mg total) by mouth 2 (two) times daily with a meal.  240 tablet  1   No facility-administered medications prior to visit.    PE: Blood pressure 130/100, pulse 82, temperature 98 F (36.7 C), temperature source Oral, height 5' 4.5" (1.638 m), weight 293 lb (132.904 kg), SpO2 97.00%. Gen: Alert, well appearing, obese WF in NAD.  Patient is oriented to person, place, time, and situation. ENT:  Eyes: no injection, icteris, swelling, or exudate.  EOMI, PERRLA. Nose: no drainage or turbinate edema/swelling.  No injection or focal lesion.  Mouth: lips without lesion/swelling.  Oral mucosa pink and moist.    Oropharynx without erythema, exudate, or swelling.  Neck - No masses or thyromegaly or limitation in range of motion CV: RRR, no m/r/g.   LUNGS: CTA bilat, nonlabored resps, good aeration in all lung fields. ABD: soft, NT/ND EXT: no clubbing, cyanosis, or edema.   LABS:  none  IMPRESSION AND PLAN:  Type II or unspecified type diabetes mellitus without mention of complication, not stated as uncontrolled Last 4 HbA1c's over the last 2 years have been 6.1% or less. Will not check this today. Urine microalb/cr today. Foot exam next f/u in 6 mo.  Last eye exam about 1 yr ago per pt report--reminded her to schedule this soon. Tdap and pneumovax today. Continue current meds, diet.  Increase exercise.  Urge incontinence Uncontrolled/untreated with meds in past per pt.  Sent UA with reflex micro, urine culture. Trial of Vesicare 5mg , 1-2 tabs po qd.  Therapeutic expectations and side effect profile of medication discussed today.  Patient's questions answered.   ESSENTIAL HYPERTENSION Encouraged pt to do more home/outside of office monitoring. No change in meds at this time. BMET today.  Vitamin B12 deficiency Past hx of borderline low vit B12, was on IM replacement monthly at one point in time. Will check vit B12 level today and restart replacement if levels return low.  An After Visit Summary was printed and given to the patient.  FOLLOW UP:  3 wks f/u urinary frequency/urge incontinence

## 2012-10-29 ENCOUNTER — Other Ambulatory Visit: Payer: Self-pay | Admitting: Family Medicine

## 2012-10-29 ENCOUNTER — Encounter: Payer: Self-pay | Admitting: Family Medicine

## 2012-10-29 DIAGNOSIS — N3941 Urge incontinence: Secondary | ICD-10-CM | POA: Insufficient documentation

## 2012-10-29 MED ORDER — OXYBUTYNIN CHLORIDE ER 5 MG PO TB24: ORAL_TABLET | ORAL | Status: DC

## 2012-10-29 NOTE — Addendum Note (Signed)
Addended by: Baldemar Lenis R on: 10/29/2012 09:29 AM   Modules accepted: Orders

## 2012-10-30 LAB — URINE CULTURE: Colony Count: 6000

## 2012-11-03 ENCOUNTER — Other Ambulatory Visit: Payer: Self-pay | Admitting: Family Medicine

## 2012-11-03 ENCOUNTER — Telehealth: Payer: Self-pay | Admitting: Family Medicine

## 2012-11-03 NOTE — Telephone Encounter (Signed)
I called [employer] at [phone] to check on the status of the Vesicare and Dwain Sarna stated it is not covered because she has to try Trospium 20mg  twice a day.

## 2012-11-04 ENCOUNTER — Other Ambulatory Visit: Payer: Self-pay | Admitting: Family Medicine

## 2012-11-04 MED ORDER — LISINOPRIL 40 MG PO TABS: 40.0000 mg | ORAL_TABLET | Freq: Every day | ORAL | Status: DC

## 2012-11-04 MED ORDER — TROSPIUM CHLORIDE 20 MG PO TABS: 20.0000 mg | ORAL_TABLET | Freq: Two times a day (BID) | ORAL | Status: DC

## 2012-11-04 MED ORDER — METFORMIN HCL 500 MG PO TABS: 500.0000 mg | ORAL_TABLET | Freq: Two times a day (BID) | ORAL | Status: DC

## 2012-11-04 MED ORDER — LEVOTHYROXINE SODIUM 175 MCG PO TABS: 175.0000 ug | ORAL_TABLET | Freq: Every day | ORAL | Status: DC

## 2012-11-04 NOTE — Telephone Encounter (Signed)
Patient's husband states that they need a six month supply of medications so it will be cheaper for them.  She needs the metformin, lisinopril, levothyroxine 6 month supply.  Please advise.

## 2012-11-18 ENCOUNTER — Ambulatory Visit: Payer: No Typology Code available for payment source | Admitting: Family Medicine

## 2012-11-29 DIAGNOSIS — E8881 Metabolic syndrome: Secondary | ICD-10-CM | POA: Insufficient documentation

## 2012-11-29 DIAGNOSIS — I10 Essential (primary) hypertension: Secondary | ICD-10-CM | POA: Insufficient documentation

## 2012-11-29 DIAGNOSIS — E538 Deficiency of other specified B group vitamins: Secondary | ICD-10-CM | POA: Insufficient documentation

## 2012-11-29 DIAGNOSIS — E88819 Insulin resistance, unspecified: Secondary | ICD-10-CM | POA: Insufficient documentation

## 2012-11-29 DIAGNOSIS — R350 Frequency of micturition: Secondary | ICD-10-CM | POA: Insufficient documentation

## 2012-11-29 NOTE — Assessment & Plan Note (Addendum)
Uncontrolled/untreated with meds in past per pt.  Sent UA with reflex micro, urine culture. Trial of Vesicare 5mg , 1-2 tabs po qd.  Therapeutic expectations and side effect profile of medication discussed today.  Patient's questions answered.

## 2012-11-29 NOTE — Addendum Note (Signed)
Addended by: Jeoffrey Massed on: 11/29/2012 04:02 PM   Modules accepted: Level of Service

## 2012-11-29 NOTE — Assessment & Plan Note (Signed)
Encouraged pt to do more home/outside of office monitoring. No change in meds at this time. BMET today.

## 2012-11-29 NOTE — Assessment & Plan Note (Addendum)
Last 4 HbA1c's over the last 2 years have been 6.1% or less. Will not check this today. Urine microalb/cr today. Foot exam next f/u in 6 mo.  Last eye exam about 1 yr ago per pt report--reminded her to schedule this soon. Tdap and pneumovax today. Continue current meds, diet.  Increase exercise.

## 2012-11-29 NOTE — Assessment & Plan Note (Signed)
Past hx of borderline low vit B12, was on IM replacement monthly at one point in time. Will check vit B12 level today and restart replacement if levels return low.

## 2013-01-07 ENCOUNTER — Telehealth: Payer: Self-pay | Admitting: Family Medicine

## 2013-01-07 MED ORDER — MIRABEGRON ER 25 MG PO TB24: 25.0000 mg | ORAL_TABLET | Freq: Every day | ORAL | Status: DC

## 2013-01-07 NOTE — Telephone Encounter (Signed)
Also stop trospium (brand name is [place]ura)--this is a bladder med that she should not take at the same time as myrbetriq.-thx

## 2013-01-07 NOTE — Telephone Encounter (Signed)
Patient aware of medication instructions.

## 2013-01-07 NOTE — Telephone Encounter (Signed)
Patient requesting a medication that she saw on tv for bladder control.   Medication is called [place].  Please advise.

## 2013-01-07 NOTE — Telephone Encounter (Signed)
OK, but she needs to stop solifenacin (Vesicare)--this is the most recent bladder med I put her on back in September this year.  I assume it didn't help since she is asking for a new med. Will send rx for myrbetriq to her local pharmacy for 1 mo supply with 4 RF's.  She needs to f/u in office in 5-6 mo--f/u chronic problems, or earlier if needed.-thx

## 2013-01-11 ENCOUNTER — Other Ambulatory Visit: Payer: Self-pay | Admitting: *Deleted

## 2013-01-11 ENCOUNTER — Telehealth: Payer: Self-pay | Admitting: *Deleted

## 2013-01-11 MED ORDER — MIRABEGRON ER 25 MG PO TB24: 25.0000 mg | ORAL_TABLET | Freq: Every day | ORAL | Status: DC

## 2013-01-11 NOTE — Telephone Encounter (Signed)
Resent rx

## 2013-01-18 NOTE — Telephone Encounter (Signed)
Complete

## 2013-02-08 ENCOUNTER — Telehealth: Payer: Self-pay | Admitting: Family Medicine

## 2013-02-08 DIAGNOSIS — R32 Unspecified urinary incontinence: Secondary | ICD-10-CM

## 2013-02-08 NOTE — Telephone Encounter (Signed)
Patient called and said she is still having bladder issues after taking bladder medication you rx'ed last.  Patient would like a referral to urologist if possible.  Please advise.

## 2013-02-08 NOTE — Telephone Encounter (Signed)
OK. Referral to [facility] has been ordered.

## 2013-02-09 NOTE — Telephone Encounter (Signed)
Patient aware of referral

## 2013-03-06 ENCOUNTER — Encounter: Payer: Self-pay | Admitting: Family Medicine

## 2013-03-23 ENCOUNTER — Encounter: Payer: Self-pay | Admitting: Family Medicine

## 2013-04-20 ENCOUNTER — Ambulatory Visit (INDEPENDENT_AMBULATORY_CARE_PROVIDER_SITE_OTHER): Payer: 59 | Admitting: Family Medicine

## 2013-04-20 ENCOUNTER — Ambulatory Visit (HOSPITAL_BASED_OUTPATIENT_CLINIC_OR_DEPARTMENT_OTHER)
Admission: RE | Admit: 2013-04-20 | Discharge: 2013-04-20 | Disposition: A | Discharge status: Home / Self Care | Payer: 59 | Source: Ambulatory Visit | Attending: Family Medicine | Admitting: Family Medicine

## 2013-04-20 ENCOUNTER — Encounter: Payer: Self-pay | Admitting: Family Medicine

## 2013-04-20 ENCOUNTER — Encounter (HOSPITAL_BASED_OUTPATIENT_CLINIC_OR_DEPARTMENT_OTHER): Payer: Self-pay

## 2013-04-20 VITALS — BP 136/81 | HR 83 | Temp 97.9°F | Resp 18 | Ht 64.5 in | Wt 300.0 lb

## 2013-04-20 DIAGNOSIS — R0602 Shortness of breath: Secondary | ICD-10-CM

## 2013-04-20 DIAGNOSIS — R5383 Other fatigue: Secondary | ICD-10-CM

## 2013-04-20 DIAGNOSIS — R5381 Other malaise: Secondary | ICD-10-CM

## 2013-04-20 DIAGNOSIS — R7989 Other specified abnormal findings of blood chemistry: Secondary | ICD-10-CM

## 2013-04-20 DIAGNOSIS — G629 Polyneuropathy, unspecified: Secondary | ICD-10-CM

## 2013-04-20 DIAGNOSIS — R791 Abnormal coagulation profile: Secondary | ICD-10-CM | POA: Insufficient documentation

## 2013-04-20 DIAGNOSIS — R0600 Dyspnea, unspecified: Secondary | ICD-10-CM

## 2013-04-20 DIAGNOSIS — G609 Hereditary and idiopathic neuropathy, unspecified: Secondary | ICD-10-CM

## 2013-04-20 DIAGNOSIS — E039 Hypothyroidism, unspecified: Secondary | ICD-10-CM

## 2013-04-20 DIAGNOSIS — R0609 Other forms of dyspnea: Secondary | ICD-10-CM

## 2013-04-20 DIAGNOSIS — E538 Deficiency of other specified B group vitamins: Secondary | ICD-10-CM

## 2013-04-20 DIAGNOSIS — R0989 Other specified symptoms and signs involving the circulatory and respiratory systems: Secondary | ICD-10-CM

## 2013-04-20 HISTORY — DX: Type 2 diabetes mellitus without complications: E11.9

## 2013-04-20 LAB — CBC WITH DIFFERENTIAL/PLATELET
BASOS ABS: 0.1 10*3/uL (ref 0.0–0.1)
Basophils Relative: 0.7 % (ref 0.0–3.0)
EOS ABS: 0.3 10*3/uL (ref 0.0–0.7)
Eosinophils Relative: 3.6 % (ref 0.0–5.0)
HCT: 37 % (ref 36.0–46.0)
Hemoglobin: 12.6 g/dL (ref 12.0–15.0)
LYMPHS PCT: 25.2 % (ref 12.0–46.0)
Lymphs Abs: 2 10*3/uL (ref 0.7–4.0)
MCHC: 33.9 g/dL (ref 30.0–36.0)
MCV: 88.3 fl (ref 78.0–100.0)
Monocytes Absolute: 0.5 10*3/uL (ref 0.1–1.0)
Monocytes Relative: 6.8 % (ref 3.0–12.0)
NEUTROS PCT: 63.7 % (ref 43.0–77.0)
Neutro Abs: 5 10*3/uL (ref 1.4–7.7)
Platelets: 325 10*3/uL (ref 150.0–400.0)
RBC: 4.2 Mil/uL (ref 3.87–5.11)
RDW: 13.7 % (ref 11.5–14.6)
WBC: 7.9 10*3/uL (ref 4.5–10.5)

## 2013-04-20 LAB — D-DIMER, QUANTITATIVE (NOT AT ARMC): D DIMER QUANT: 1.14 ug{FEU}/mL — AB (ref 0.00–0.48)

## 2013-04-20 LAB — COMPREHENSIVE METABOLIC PANEL
ALK PHOS: 94 U/L (ref 39–117)
ALT: 18 U/L (ref 0–35)
AST: 18 U/L (ref 0–37)
Albumin: 4 g/dL (ref 3.5–5.2)
BUN: 19 mg/dL (ref 6–23)
CO2: 31 mEq/L (ref 19–32)
Calcium: 9.3 mg/dL (ref 8.4–10.5)
Chloride: 100 mEq/L (ref 96–112)
Creatinine, Ser: 0.7 mg/dL (ref 0.4–1.2)
GFR: 91.14 mL/min (ref >60.00)
Glucose, Bld: 93 mg/dL (ref 70–99)
POTASSIUM: 4.4 meq/L (ref 3.5–5.1)
SODIUM: 136 meq/L (ref 135–145)
TOTAL PROTEIN: 7.5 g/dL (ref 6.0–8.3)
Total Bilirubin: 0.5 mg/dL (ref 0.3–1.2)

## 2013-04-20 LAB — VITAMIN B12: Vitamin B-12: 750 pg/mL (ref 211–911)

## 2013-04-20 LAB — BRAIN NATRIURETIC PEPTIDE: PRO B NATRI PEPTIDE: 36 pg/mL (ref 0.0–100.0)

## 2013-04-20 LAB — TSH: TSH: 4.26 u[IU]/mL (ref 0.35–5.50)

## 2013-04-20 MED ORDER — IOHEXOL 350 MG/ML SOLN
100.0000 mL | Freq: Once | INTRAVENOUS | Status: AC | PRN
  Administered 2013-04-20: 100 mL via INTRAVENOUS

## 2013-04-20 NOTE — Assessment & Plan Note (Addendum)
EKG reassuring today. Will further eval today with stat d-dimer. Will also obtain BNP and a CXR. Anticipate further eval with echocardiogram +/- cardiologist referral depending on results of today's testing.  Other diagnostic considerations: She is a set-up for obesity hypoventilation syndrome.  Has unclear hx of OSA, ?intolerant of CPAP?

## 2013-04-20 NOTE — Assessment & Plan Note (Signed)
Chronic, but seems worse lately per pt. W/u in the past has been largely unrevealing. Even when we gave her vit B12 replacement when she was mildly B12 deficient her fatigue did not improve.   Will recheck CBC, TSH, vit b12 level, CMET.   Obesity, sedentary lifestyle, poor sleep quality/quantity due to nocturia + recent PND all likely contributing to this chronic fatigue.

## 2013-04-20 NOTE — Progress Notes (Addendum)
OFFICE NOTE  04/20/2013  CC:  Chief Complaint  Patient presents with  . Shortness of Breath  . Fatigue     HPI: Patient is a 58 y.o. Caucasian female who is here for SOB. Describes onset of feeling of smothering/SOB that wakes her up from sleep around 4 Am the last 4-5 nights. She sits upright and this helps and is able to then go back to sleep.   Episodes assoc with lightheaded feeling but no palpitations or heart racing or chest pains.  Feels irritable, legs restless and she can't get comfortable.  She gets up frequently to urinate due to her urge incontinence--no change there. Other random periods of SOB.  Also feels like she has "alot of fluid" in legs/feet.   Today has felt SOB all morning up until now-seems to come and go randomly and she does not associate it consistently with walking.  No wheezing with this.  No fever, no URI sx's.   Has history of asthma but has not had any symptoms in years so she no longer has an inhaler. +Fatigue, legs feel heavier, etc x weeks.  No focal weakness.  Still sees Dr. Selinda Orion for pain control for lumbar DDD/chronic pain.  Pertinent PMH:  Past Medical History  Diagnosis Date  . Insulin resistance   . Hypertension   . Hypothyroidism   . 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 DDD (degenerative disc disease)     Dr. [name] evaluated her and recommended pain mgmt MD.  She saw Dr. Selinda Orion for pain mgmt at one time.  Marland Kitchen GERD (gastroesophageal reflux disease)   . Insomnia   . Morbid obesity     BMI 49.  Gastric stapling surgery in distant past.  . Heart murmur     functional, w/u done  . Asthma   . Peripheral neuropathy     No old records to confirm pt's report.  Pt refuses to have more NCS b/c test is painful.  . Carpal tunnel syndrome     bilateral  . Fibromyalgia     questionable  . Nephrolithiasis     Lithotripsy 2002.  1.8 cm stone in right renal pelvis 02/2013--seeing Dr. Jeffie Pollock to discuss tx options  . Chronic  venous insufficiency     with edema  and extensive varicose dz: Dr. Linus Mako is managing this, planning laser procedures.  . Disequilibrium syndrome     MRI brain normal 2012  . DIZZINESS 04/12/2010    Qualifier: Diagnosis of  By: Anitra Lauth M.D., Brien Few   . Carpal tunnel syndrome on both sides 06/21/2010  . Mixed incontinence urge and stress   . Microscopic hematuria 2014    CT abd pelv showed 1.8 cm right renal pelvis stone.  Also had UA c/w UTI so abx given.    Past Surgical History  Procedure Laterality Date  . Cholecystectomy  1987  . Joint replacement  2009    Right Knee, [facility]  . Foot surgery  2000    Left tarsel tunnel release  . Hernia repair   1980's    Diaphragmatic + gastric stapling  . Hernia repair      Inguinal  . Extracorporeal shock wave lithotripsy    . Endometrial ablation      menorrhagia     MEDS: Not taking mirabegron listed below Outpatient Prescriptions Prior to Visit  Medication Sig Dispense Refill  . levothyroxine (SYNTHROID, LEVOTHROID) 175 MCG tablet Take 1 tablet (175 mcg  total) by mouth daily.  180 tablet  1  . lisinopril (PRINIVIL,ZESTRIL) 40 MG tablet Take 1 tablet (40 mg total) by mouth daily.  180 tablet  1  . metFORMIN (GLUCOPHAGE) 500 MG tablet Take 1 tablet (500 mg total) by mouth 2 (two) times daily with a meal.  180 tablet  1  . oxyCODONE-acetaminophen (PERCOCET) 10-325 MG per tablet Take 1 tablet by mouth. Up to 5 times daily      . glucose blood (FREESTYLE LITE) test strip Use as directed.  DX 250.00  100 each  11  . mirabegron ER (MYRBETRIQ) 25 MG TB24 tablet Take 1 tablet (25 mg total) by mouth daily. May increase to 2 tabs po qAM if not feeling a good response after 7 days.  60 tablet  4  . oxybutynin (DITROPAN-XL) 5 MG 24 hr tablet 1-2 tabs daily  60 tablet  0   No facility-administered medications prior to visit.  Estrogen patch via GYN Progesterone x 2 wks every other month Takes 2-3 alleve per day  PE: Blood  pressure 136/81, pulse 83, temperature 97.9 F (36.6 C), temperature source Temporal, resp. rate 18, height 5' 4.5" (1.638 m), weight 300 lb (136.079 kg), SpO2 100.00%. Gen: Alert, well appearing, morbidly obese white female in NAD.  Patient is oriented to person, place, time, and situation. PPJ:KDTO: no injection, icteris, swelling, or exudate.  EOMI, PERRLA. Mouth: lips without lesion/swelling.  Oral mucosa pink and moist. Oropharynx without erythema, exudate, or swelling.  Neck - No masses or thyromegaly or limitation in range of motion.  Redundant soft tissues present and I cannot discern JVD. CV: RRR, no m/r/g Chest is clear, no wheezing or rales. Normal symmetric air entry throughout both lung fields. No chest wall deformities or tenderness.  Nonlabored resps. Abd: rotund, soft, benign EXT: no cyanosis.  NO hand edema. LL's with extensive non-inflamed varicosities and 2+ pitting edema bilat.    12 lead EKG: NSR, rate 74, no ischemic changes, no hypertrophy, no ectopy.  Wave morphologies and intervals normal.  IMPRESSION AND PLAN:  Shortness of breath EKG reassuring today. Will further eval today with stat d-dimer. Will also obtain BNP and a CXR. Anticipate further eval with echocardiogram +/- cardiologist referral depending on results of today's testing.  Other diagnostic considerations: She is a set-up for obesity hypoventilation syndrome.  Has unclear hx of OSA, ?intolerant of CPAP?    Other malaise and fatigue Chronic, but seems worse lately per pt. W/u in the past has been largely unrevealing. Even when we gave her vit B12 replacement when she was mildly B12 deficient her fatigue did not improve.   Will recheck CBC, TSH, vit b12 level, CMET.   Obesity, sedentary lifestyle, poor sleep quality/quantity due to nocturia + recent PND all likely contributing to this chronic fatigue.   Hypothyroidism: recheck TSH today.  An After Visit Summary was printed and given to the  patient.  Spent 55 min with pt today, with >50% of this time spent in counseling and care coordination regarding the above problems.   FOLLOW UP: To be determined based on results of pending workup.   04/20/13, 4 PM: D-dimer result was 1.14 (H). Will arrange for CT angiogram to further eval for PE ASAP. We've called lab to ask them to do her Creatinine stat today.

## 2013-04-20 NOTE — Progress Notes (Signed)
Pre visit review using our clinic review tool, if applicable. No additional management support is needed unless otherwise documented below in the visit note. 

## 2013-04-20 NOTE — Addendum Note (Signed)
Addended by: Tammi Sou on: 04/20/2013 04:11 PM   Modules accepted: Orders

## 2013-04-21 ENCOUNTER — Ambulatory Visit (HOSPITAL_BASED_OUTPATIENT_CLINIC_OR_DEPARTMENT_OTHER)
Admission: RE | Admit: 2013-04-21 | Discharge: 2013-04-21 | Disposition: A | Discharge status: Home / Self Care | Payer: 59 | Source: Ambulatory Visit | Attending: Family Medicine | Admitting: Family Medicine

## 2013-04-21 ENCOUNTER — Other Ambulatory Visit: Payer: Self-pay | Admitting: Family Medicine

## 2013-04-21 DIAGNOSIS — I872 Venous insufficiency (chronic) (peripheral): Secondary | ICD-10-CM | POA: Diagnosis present

## 2013-04-21 DIAGNOSIS — R0600 Dyspnea, unspecified: Secondary | ICD-10-CM

## 2013-04-21 DIAGNOSIS — R7989 Other specified abnormal findings of blood chemistry: Secondary | ICD-10-CM

## 2013-04-21 DIAGNOSIS — M7989 Other specified soft tissue disorders: Secondary | ICD-10-CM | POA: Diagnosis not present

## 2013-04-21 DIAGNOSIS — R0602 Shortness of breath: Secondary | ICD-10-CM

## 2013-04-27 ENCOUNTER — Encounter: Payer: Self-pay | Admitting: Family Medicine

## 2013-04-27 ENCOUNTER — Ambulatory Visit (HOSPITAL_BASED_OUTPATIENT_CLINIC_OR_DEPARTMENT_OTHER)
Admission: RE | Admit: 2013-04-27 | Discharge: 2013-04-27 | Disposition: A | Discharge status: Home / Self Care | Payer: 59 | Source: Ambulatory Visit | Attending: Family Medicine | Admitting: Family Medicine

## 2013-04-27 DIAGNOSIS — I10 Essential (primary) hypertension: Secondary | ICD-10-CM | POA: Insufficient documentation

## 2013-04-27 DIAGNOSIS — E119 Type 2 diabetes mellitus without complications: Secondary | ICD-10-CM | POA: Insufficient documentation

## 2013-04-27 DIAGNOSIS — R0609 Other forms of dyspnea: Secondary | ICD-10-CM | POA: Insufficient documentation

## 2013-04-27 DIAGNOSIS — R0989 Other specified symptoms and signs involving the circulatory and respiratory systems: Principal | ICD-10-CM | POA: Insufficient documentation

## 2013-04-27 DIAGNOSIS — I369 Nonrheumatic tricuspid valve disorder, unspecified: Secondary | ICD-10-CM

## 2013-04-27 DIAGNOSIS — Z87891 Personal history of nicotine dependence: Secondary | ICD-10-CM | POA: Insufficient documentation

## 2013-04-27 DIAGNOSIS — R0600 Dyspnea, unspecified: Secondary | ICD-10-CM

## 2013-04-27 DIAGNOSIS — I079 Rheumatic tricuspid valve disease, unspecified: Secondary | ICD-10-CM | POA: Insufficient documentation

## 2013-04-27 DIAGNOSIS — R0602 Shortness of breath: Secondary | ICD-10-CM

## 2013-04-27 NOTE — Progress Notes (Signed)
  Echocardiogram 2D Echocardiogram has been performed.  Tricia Perry FRANCES 04/27/2013, 10:35 AM

## 2013-04-28 ENCOUNTER — Encounter: Payer: Self-pay | Admitting: Family Medicine

## 2013-04-29 ENCOUNTER — Other Ambulatory Visit: Payer: Self-pay | Admitting: Urology

## 2013-04-29 DIAGNOSIS — N200 Calculus of kidney: Secondary | ICD-10-CM

## 2013-05-18 ENCOUNTER — Encounter (HOSPITAL_COMMUNITY): Payer: Self-pay | Admitting: Pharmacy Technician

## 2013-05-20 ENCOUNTER — Other Ambulatory Visit: Payer: Self-pay | Admitting: Radiology

## 2013-05-23 ENCOUNTER — Encounter (HOSPITAL_COMMUNITY)
Admission: RE | Admit: 2013-05-23 | Discharge: 2013-05-23 | Disposition: A | Discharge status: Home / Self Care | Payer: 59 | Source: Ambulatory Visit | Attending: Urology | Admitting: Urology

## 2013-05-23 ENCOUNTER — Other Ambulatory Visit: Payer: Self-pay | Admitting: Radiology

## 2013-05-23 ENCOUNTER — Encounter (INDEPENDENT_AMBULATORY_CARE_PROVIDER_SITE_OTHER): Payer: Self-pay

## 2013-05-23 ENCOUNTER — Encounter (HOSPITAL_COMMUNITY): Payer: Self-pay

## 2013-05-23 HISTORY — DX: Pain in unspecified shoulder: M25.519

## 2013-05-23 HISTORY — DX: Sleep apnea, unspecified: G47.30

## 2013-05-23 HISTORY — DX: Other complications of anesthesia, initial encounter: T88.59XA

## 2013-05-23 HISTORY — DX: Adverse effect of unspecified anesthetic, initial encounter: T41.45XA

## 2013-05-23 LAB — CBC
HEMATOCRIT: 38.3 % (ref 36.0–46.0)
Hemoglobin: 12.2 g/dL (ref 12.0–15.0)
MCH: 29.3 pg (ref 26.0–34.0)
MCHC: 31.9 g/dL (ref 30.0–36.0)
MCV: 91.8 fL (ref 78.0–100.0)
Platelets: 307 10*3/uL (ref 150–400)
RBC: 4.17 MIL/uL (ref 3.87–5.11)
RDW: 13.8 % (ref 11.5–15.5)
WBC: 6.3 10*3/uL (ref 4.0–10.5)

## 2013-05-23 LAB — ABO/RH: ABO/RH(D): A POS

## 2013-05-23 LAB — BASIC METABOLIC PANEL
BUN: 20 mg/dL (ref 6–23)
CO2: 29 mEq/L (ref 19–32)
Calcium: 10 mg/dL (ref 8.4–10.5)
Chloride: 97 mEq/L (ref 96–112)
Creatinine, Ser: 0.78 mg/dL (ref 0.50–1.10)
GFR calc Af Amer: >90 mL/min (ref >90)
GFR calc non Af Amer: >90 mL/min (ref >90)
Glucose, Bld: 100 mg/dL — ABNORMAL HIGH (ref 70–99)
Potassium: 5 mEq/L (ref 3.7–5.3)
Sodium: 136 mEq/L — ABNORMAL LOW (ref 137–147)

## 2013-05-23 LAB — PROTIME-INR
INR: 0.91 (ref 0.00–1.49)
Prothrombin Time: 12.1 seconds (ref 11.6–15.2)

## 2013-05-23 LAB — APTT: aPTT: 31 seconds (ref 24–37)

## 2013-05-23 NOTE — Patient Instructions (Signed)
   YOUR SURGERY IS SCHEDULED AT [facility]  ON:  Thursday  2/26  REPORT TO  SHORT STAY CENTER AT 7:30 AM      PHONE # FOR SHORT STAY IS [phone]  DO NOT EAT OR DRINK ANYTHING AFTER MIDNIGHT THE NIGHT BEFORE YOUR SURGERY.  YOU MAY BRUSH YOUR TEETH, RINSE OUT YOUR MOUTH--BUT NO WATER, NO FOOD, NO CHEWING GUM, NO MINTS, NO CANDIES, NO CHEWING TOBACCO.  PLEASE TAKE THE FOLLOWING MEDICATIONS THE AM OF YOUR SURGERY WITH A FEW SIPS OF WATER:  SYNTHROID, OXYCODONE.    IF YOU ARE DIABETIC:  DO NOT TAKE ANY DIABETIC MEDICATIONS THE AM OF YOUR SURGERY.     DO NOT BRING VALUABLES, MONEY, CREDIT CARDS.  DO NOT WEAR JEWELRY, MAKE-UP, NAIL POLISH AND NO METAL PINS OR CLIPS IN YOUR HAIR. CONTACT LENS, DENTURES / PARTIALS, GLASSES SHOULD NOT BE WORN TO SURGERY AND IN MOST CASES-HEARING AIDS WILL NEED TO BE REMOVED.  BRING YOUR GLASSES CASE, ANY EQUIPMENT NEEDED FOR YOUR CONTACT LENS. FOR PATIENTS ADMITTED TO THE HOSPITAL--CHECK OUT TIME THE DAY OF DISCHARGE IS 11:00 AM.  ALL INPATIENT ROOMS ARE PRIVATE - WITH BATHROOM, TELEPHONE, TELEVISION AND WIFI INTERNET.                                                     FAILURE TO FOLLOW THESE INSTRUCTIONS MAY RESULT IN THE CANCELLATION OF YOUR SURGERY. PLEASE BE AWARE THAT YOU MAY NEED ADDITIONAL BLOOD DRAWN DAY OF YOUR SURGERY  PATIENT SIGNATURE_________________________________

## 2013-05-23 NOTE — Pre-Procedure Instructions (Signed)
EKG AND CXR REPORTS ARE IN EPIC FROM 04/20/13. CT ANGIO OF CHEST REPORT IN EPIC FROM 04/21/13 2 D ECHO REPORT IN [facility] 04/27/13.

## 2013-05-26 ENCOUNTER — Ambulatory Visit (HOSPITAL_COMMUNITY)
Admission: RE | Admit: 2013-05-26 | Discharge: 2013-05-26 | Disposition: A | Discharge status: Home / Self Care | Payer: 59 | Source: Ambulatory Visit | Attending: Urology | Admitting: Urology

## 2013-05-26 ENCOUNTER — Encounter (HOSPITAL_COMMUNITY): Payer: Self-pay

## 2013-05-26 ENCOUNTER — Encounter (HOSPITAL_COMMUNITY)
Admission: RE | Disposition: A | Discharge status: Home / Self Care | Payer: Self-pay | Source: Ambulatory Visit | Attending: Urology

## 2013-05-26 VITALS — BP 118/56 | HR 99 | Temp 97.8°F | Resp 20 | Ht 64.0 in | Wt 305.0 lb

## 2013-05-26 DIAGNOSIS — N200 Calculus of kidney: Secondary | ICD-10-CM

## 2013-05-26 DIAGNOSIS — I10 Essential (primary) hypertension: Secondary | ICD-10-CM | POA: Insufficient documentation

## 2013-05-26 DIAGNOSIS — R3915 Urgency of urination: Secondary | ICD-10-CM | POA: Insufficient documentation

## 2013-05-26 DIAGNOSIS — Z9089 Acquired absence of other organs: Secondary | ICD-10-CM | POA: Insufficient documentation

## 2013-05-26 DIAGNOSIS — Z96659 Presence of unspecified artificial knee joint: Secondary | ICD-10-CM | POA: Insufficient documentation

## 2013-05-26 DIAGNOSIS — Z79899 Other long term (current) drug therapy: Secondary | ICD-10-CM | POA: Insufficient documentation

## 2013-05-26 DIAGNOSIS — K219 Gastro-esophageal reflux disease without esophagitis: Secondary | ICD-10-CM | POA: Insufficient documentation

## 2013-05-26 DIAGNOSIS — N3944 Nocturnal enuresis: Secondary | ICD-10-CM | POA: Insufficient documentation

## 2013-05-26 DIAGNOSIS — E039 Hypothyroidism, unspecified: Secondary | ICD-10-CM | POA: Insufficient documentation

## 2013-05-26 DIAGNOSIS — E119 Type 2 diabetes mellitus without complications: Secondary | ICD-10-CM | POA: Insufficient documentation

## 2013-05-26 DIAGNOSIS — R011 Cardiac murmur, unspecified: Secondary | ICD-10-CM | POA: Insufficient documentation

## 2013-05-26 DIAGNOSIS — N289 Disorder of kidney and ureter, unspecified: Secondary | ICD-10-CM | POA: Insufficient documentation

## 2013-05-26 DIAGNOSIS — N390 Urinary tract infection, site not specified: Secondary | ICD-10-CM | POA: Insufficient documentation

## 2013-05-26 DIAGNOSIS — J45909 Unspecified asthma, uncomplicated: Secondary | ICD-10-CM | POA: Insufficient documentation

## 2013-05-26 DIAGNOSIS — Z538 Procedure and treatment not carried out for other reasons: Secondary | ICD-10-CM | POA: Insufficient documentation

## 2013-05-26 DIAGNOSIS — Z87891 Personal history of nicotine dependence: Secondary | ICD-10-CM | POA: Insufficient documentation

## 2013-05-26 DIAGNOSIS — G4730 Sleep apnea, unspecified: Secondary | ICD-10-CM | POA: Insufficient documentation

## 2013-05-26 DIAGNOSIS — Z6841 Body Mass Index (BMI) 40.0 and over, adult: Secondary | ICD-10-CM | POA: Insufficient documentation

## 2013-05-26 DIAGNOSIS — R3129 Other microscopic hematuria: Secondary | ICD-10-CM | POA: Insufficient documentation

## 2013-05-26 LAB — TYPE AND SCREEN
ABO/RH(D): A POS
Antibody Screen: NEGATIVE

## 2013-05-26 LAB — GLUCOSE, CAPILLARY: Glucose-Capillary: 113 mg/dL — ABNORMAL HIGH (ref 70–99)

## 2013-05-26 SURGERY — NEPHROLITHOTOMY PERCUTANEOUS
Anesthesia: General

## 2013-05-26 MED ORDER — FENTANYL CITRATE 0.05 MG/ML IJ SOLN
INTRAMUSCULAR | Status: AC | PRN
  Administered 2013-05-26 (×6): 50 ug via INTRAVENOUS

## 2013-05-26 MED ORDER — CIPROFLOXACIN IN D5W 400 MG/200ML IV SOLN
400.0000 mg | Freq: Once | INTRAVENOUS | Status: DC
  Filled 2013-05-26: qty 200

## 2013-05-26 MED ORDER — IOHEXOL 300 MG/ML  SOLN
20.0000 mL | Freq: Once | INTRAMUSCULAR | Status: AC | PRN
  Administered 2013-05-26: 1 mL via INTRAVENOUS

## 2013-05-26 MED ORDER — CIPROFLOXACIN IN D5W 400 MG/200ML IV SOLN
400.0000 mg | INTRAVENOUS | Status: AC
  Administered 2013-05-26: 400 mg via INTRAVENOUS

## 2013-05-26 MED ORDER — MIDAZOLAM HCL 2 MG/2ML IJ SOLN
INTRAMUSCULAR | Status: AC
  Filled 2013-05-26: qty 4

## 2013-05-26 MED ORDER — FENTANYL CITRATE 0.05 MG/ML IJ SOLN
INTRAMUSCULAR | Status: AC
  Filled 2013-05-26: qty 6

## 2013-05-26 MED ORDER — LIDOCAINE HCL 1 % IJ SOLN
INTRAMUSCULAR | Status: AC
  Filled 2013-05-26: qty 20

## 2013-05-26 MED ORDER — OXYCODONE HCL 5 MG PO TABS
15.0000 mg | ORAL_TABLET | Freq: Once | ORAL | Status: AC
  Administered 2013-05-26: 15 mg via ORAL
  Filled 2013-05-26: qty 3

## 2013-05-26 MED ORDER — SODIUM CHLORIDE 0.9 % IV SOLN
Freq: Once | INTRAVENOUS | Status: AC
  Administered 2013-05-26: 08:00:00 via INTRAVENOUS

## 2013-05-26 MED ORDER — CIPROFLOXACIN IN D5W 400 MG/200ML IV SOLN
INTRAVENOUS | Status: AC
  Filled 2013-05-26: qty 200

## 2013-05-26 MED ORDER — MIDAZOLAM HCL 2 MG/2ML IJ SOLN
INTRAMUSCULAR | Status: AC | PRN
  Administered 2013-05-26 (×8): 1 mg via INTRAVENOUS

## 2013-05-26 MED ORDER — MIDAZOLAM HCL 2 MG/2ML IJ SOLN
INTRAMUSCULAR | Status: AC
  Filled 2013-05-26: qty 6

## 2013-05-26 NOTE — Progress Notes (Signed)
O2 sats 98% on Room air. Assisted pt to standing position and transferred to recliner. Pt anxious at first because" I think the shortness of breath is worse" but once she was placed in recliner with feet elevated states " I think it is better" Pt eating lunch and talking with staff and states she is feeling better " I had my heart and lungs checked recently for this shortness of breath and they couldn't find anything". Right nephrostomy tube draining medium pink draining. Dressing remains CDI .

## 2013-05-26 NOTE — H&P (Signed)
Tricia Perry is an 58 y.o. female.   Chief Complaint: right kidney stone HPI: Patient with history of large right renal pelvis stone presents today for right nephroureteral catheter placement prior to nephrolithotomy.   Past Medical History  Diagnosis Date  . Insulin resistance   . Hypertension   . Hypothyroidism   . 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 DDD (degenerative disc disease)     Dr. [name] evaluated her and recommended pain mgmt MD.  She saw Dr. Selinda Orion for pain mgmt at one time.  Marland Kitchen GERD (gastroesophageal reflux disease)   . Insomnia   . Morbid obesity     BMI 49.  Gastric stapling surgery in distant past.  . Peripheral neuropathy     No old records to confirm pt's report.  Pt refuses to have more NCS b/c test is painful.  . Carpal tunnel syndrome     bilateral  . Fibromyalgia     questionable  . Nephrolithiasis     Lithotripsy 2002.  1.8 cm stone in right renal pelvis 02/2013--will likely get this removed by Dr. Jeffie Pollock  . Disequilibrium syndrome     MRI brain normal 2012  . DIZZINESS 04/12/2010    Qualifier: Diagnosis of  By: Anitra Lauth M.D., Brien Few   . Carpal tunnel syndrome on both sides 06/21/2010  . Mixed incontinence urge and stress   . Microscopic hematuria 2014    CT abd pelv showed 1.8 cm right renal pelvis stone.  Also had UA c/w UTI so abx given.   . Shoulder pain     HX OF BILATERAL SHOULDER SURGERIES - PT HAS VERY LIMITED ROM - ESPECIALLY RAISING HER ARMS  . Complication of anesthesia     STATES SHE WAS TOLD SHE WOKE UP DURING HER KNEE REPLACEMENT SURGERY AND HAD TO BE GIVEN MORE MEDICINE - NOT SURE IF SPINAL OR GENERAL ANESTHESIA  . Chronic venous insufficiency     with edema  and extensive varicose dz: Dr. Linus Mako is managing this, planning laser procedures.; A LOT OF LEG CRAMPS AND LEG STIFFNESS  . Asthma     NOT NEEDED INHALER IN OVER A YEAR  . Heart murmur     functional, w/u done; PALPITATIONS IN THE PAST  . Sleep  apnea     PT DOES NOT HAVE MASK OR TUBING - JUST SLEEPS WITH HEAD ELEVATED ON 2 PILLOWS   . Diabetes mellitus without complication     ORAL MEDICATION  . Urinary frequency     AND NOCTURIA - UP AT NIGHT 5 OR 6 TIMES TO BATHROOM    Past Surgical History  Procedure Laterality Date  . Cholecystectomy  1987  . Joint replacement  2009    Right Knee, [facility]  . Foot surgery  2000    Left tarsel tunnel release  . Hernia repair   1980's    Diaphragmatic + gastric stapling  . Hernia repair      Inguinal  . Extracorporeal shock wave lithotripsy    . Endometrial ablation      menorrhagia  . Bilateral shoulder surgery      Family History  Problem Relation Age of Onset  . Hypertension Mother   . Cancer Mother     Brain/Lung/ smoker  . Diabetes Mother   . Stroke Father   . ADD / ADHD Daughter     ADHD  . Thyroid disease Brother   . Heart attack Maternal Grandfather   .  Alcohol abuse Paternal Grandfather    Social History:  reports that she quit smoking about 38 years ago. Her smoking use included Cigarettes. She smoked 0.00 packs per day for 5 years. She has never used smokeless tobacco. She reports that she drinks alcohol. She reports that she does not use illicit drugs.  Allergies:  Allergies  Allergen Reactions  . Codeine Itching    Back hurts  . Morphine Itching    back hurts    No current facility-administered medications for this encounter. No current outpatient prescriptions on file. Facility-Administered Medications Ordered in Other Encounters: 0.9 %  sodium chloride infusion, , Intravenous, Once, Lavonia Drafts, PA-C;  ciprofloxacin (CIPRO) IVPB 400 mg, 400 mg, Intravenous, 60 min Pre-Op, Irine Seal, MD  No results found for this or any previous visit (from the past 48 hour(s)). No results found.  Review of Systems  Constitutional: Negative for fever and chills.  Respiratory: Negative for cough.        Occ dyspnea  Cardiovascular: Negative for chest pain.   Gastrointestinal: Negative for nausea, vomiting, abdominal pain and blood in stool.  Genitourinary: Positive for frequency and flank pain. Negative for dysuria and hematuria.  Musculoskeletal: Positive for back pain.       Intermittent bilat LE pain  Neurological: Negative for headaches.  Endo/Heme/Allergies: Does not bruise/bleed easily.  Psychiatric/Behavioral: The patient is nervous/anxious.    Vitals: BP 139/82  HR 94  R 18  TEMP 98.4  O2 SATS 99% RA Physical Exam  Constitutional: She is oriented to person, place, and time. She appears well-developed and well-nourished.  Cardiovascular: Normal rate and regular rhythm.   Respiratory: Effort normal and breath sounds normal.  GI: Soft. Bowel sounds are normal. There is no tenderness.  obese  Musculoskeletal: Normal range of motion. She exhibits edema.  Neurological: She is alert and oriented to person, place, and time.     Assessment/Plan Patient with history of large right renal pelvis stone presents today for right nephroureteral catheter placement prior to nephrolithotomy. Details/risks of procedure d/w pt/family with their understanding and consent.   ALLRED,D KEVIN 05/26/2013, 7:47 AM

## 2013-05-26 NOTE — Progress Notes (Signed)
Ambulated ptt o BR with assist of her husband and walking cane. Able to ambulatewithout change of the intermittent shortness of breath. O2 sats 98 upon return to room and seated in recliner/ on room air. Pt voided moderate amt slight pink urine and emptied 50cc medium pink urine from nephrostomy tube.Pt states her pain is 7/10 right flank and her usual back pain that she takes her Oxycodone 15 mg  5 x daily for. Called Rowe Robert PA requesting pain medication order and to report pt's tolerance of ambulation and the intermittent shortness of breath.

## 2013-05-26 NOTE — Progress Notes (Signed)
Dr Barbie Banner here to see patient. Pt states her shortness of breath might be a little better. Wanting to put her lip gloss on and eager for lunch O2 removed and patient on room air

## 2013-05-26 NOTE — Progress Notes (Signed)
Prior to procedure when assessing pain pt states she" was having some back pain and some shortness of breath but I have that at home sometimes" Upon return from the procedure states some soreness at the right flank and some shortness of breath O2 was 93-100 room air. Pt warm and dry color. Pt becoming anxious  Denies any chest pain or arm pain. Then requesting some lunch. Then expressing some concern about her shortness of breath Placed O2 at 2L on patient. Rowe Robert PA for radiology here to evaluate pt. Pt taking with her husband then expresses some concern about this shortness of breath and getting the kidney stone out and asking about getting some lunch. Pt states she is unsure if the oxygen is helping or not

## 2013-05-26 NOTE — Discharge Instructions (Signed)
You may take a brief shower then remove dressing and replace with gauze and tape daily. Dr Ralene Muskrat office will contact you with future plans for your kidney stone.     Percutaneous Nephrostomy, Care After Refer to this sheet in the next few weeks. These instructions provide you with information on caring for yourself after your procedure. Your health care provider may also give you more specific instructions. Your treatment has been planned according to current medical practices, but problems sometimes occur. Call your health care provider if you have any problems or questions after your procedure. WHAT TO EXPECT AFTER THE PROCEDURE  You will need to remain lying down for several hours. Some people are able to leave the hospital the same day, others stay overnight. HOME CARE INSTRUCTIONS  Your nephrostomy tube is connected to a leg bag or bedside drainage bag. Always keep the tubing, the leg bag, or the bedside drainage bags below the level of the kidney so that the urine drains freely.  During the day, if you are connecting the nephrostomy tube to a leg bag, be sure there are no kinks in the tubing and that the urine is draining freely.  At night, you may want to connect the nephrostomy tube or the leg bag to a larger bedside drainage bag.  Change the dressing as often as directed by your health care provider or if it becomes wet.  Gently remove the tapes and dressing from around the nephrostomy tube. Be careful not to pull on the tube while removing the dressing.  Wash the skin around the tube, rinse well and dry.  Place two split drain sponges in and around the tube exit site.  Place tape around edge of the dressing.  Secure the nephrostomy tubing. Remember to make certain that the nephrostomy tube does not kink or become pinched closed. It can be useful to wrap any exposed tubing going from the nephrostomy tube to any of the connecting tubes to either the leg bag or drainage bag with  an elastic bandage.  Every three weeks, replace the leg bag, drainage bag, and any extension tubing connected to your nephrostomy tube. Your health care provider will explain how to change the drainage bag and extension tubing. SEEK MEDICAL CARE IF:  You experience any problems with any of the valves or tubing.  You have persistent pain or soreness in your back. SEEK IMMEDIATE MEDICAL CARE IF:  You have a fever or chills.  You have abdominal pain during the first week.  You have a new appearance of blood in urine.  You have back pain that is not relieved by your medicine.  You have redness, swelling, or drainage at the tube insertion site.  You have decreased urine output.  Your nephrostomy tube comes out. Document Released: 11/08/2003 Document Revised: 01/05/2013 Document Reviewed: 11/11/2012 [facility] Patient Information 2014 [place], [place]. Moderate Sedation, Adult Moderate sedation is given to help you relax or even sleep through a procedure. You may remain sleepy, be clumsy, or have poor balance for several hours following this procedure. Arrange for a responsible adult, family member, or friend to take you home. A responsible adult should stay with you for at least 24 hours or until the medicines have worn off.  Do not participate in any activities where you could become injured for the next 24 hours, or until you feel normal again. Do not:  Drive.  Swim.  Ride a bicycle.  Operate heavy machinery.  Shawn.  Use power tools.  Climb ladders.  Work at [employer].  Do not make important decisions or sign legal documents until you are improved.  Vomiting may occur if you eat too soon. When you can drink without vomiting, try water, juice, or soup. Try solid foods if you feel little or no nausea.  Only take over-the-counter or prescription medications for pain, discomfort, or fever as directed by your caregiver.If pain medications have been prescribed for you, ask your  caregiver how soon it is safe to take them.  Make sure you and your family fully understands everything about the medication given to you. Make sure you understand what side effects may occur.  You should not drink alcohol, take sleeping pills, or medications that cause drowsiness for at least 24 hours.  If you smoke, do not smoke alone.  If you are feeling better, you may resume normal activities 24 hours after receiving sedation.  Keep all appointments as scheduled. Follow all instructions.  Ask questions if you do not understand. SEEK MEDICAL CARE IF:   Your skin is pale or bluish in color.  You continue to feel sick to your stomach (nauseous) or throw up (vomit).  Your pain is getting worse and not helped by medication.  You have bleeding or swelling.  You are still sleepy or feeling clumsy after 24 hours. SEEK IMMEDIATE MEDICAL CARE IF:   You develop a rash.  You have difficulty breathing.  You develop any type of allergic problem.  You have a fever. Document Released: 12/10/2000 Document Revised: 06/09/2011 Document Reviewed: 11/22/2012 [facility] Patient Information 2014 [place].

## 2013-05-26 NOTE — H&P (Signed)
Problems   1. Kidney stone on right side (592.0)  2. Microscopic hematuria (599.72)  3. Nocturia (788.43)  4. Urinary tract infection (599.0)  5. Urinary urgency (788.63)  History of Present Illness  Tricia Perry returns today in f/u to discuss treatment of the 2.3cm stone found on her recent CT for the evaluation of hematuria.   She doesn't have much flank pain or hematuria but has had increased frequency and nocturia and her UA has pyuria again today.   She is currently being evaluated by Dr. Anitra Lauth from some swelling in the RLE and SOB with an elevated D-dimer.   Past Medical History Problems   1. History of arthritis (V13.4)  2. History of asthma (V12.69)  3. History of cardiac murmur (V12.59)  4. History of diabetes mellitus (V12.29)  5. History of hypertension (V12.59)  6. History of hypothyroidism (V12.29)  7. History of renal calculi (V13.01)  8. History of sleep apnea (V13.89)  Surgical History Problems   1. History of Cholecystectomy  2. History of Foot Surgery  3. History of Hernia Repair  4. History of Knee Replacement  5. History of Lithotripsy  6. History of Rotator Cuff Repair  Current Meds  1. CefTRIAXone Sodium 1 GM Injection Solution Reconstituted; INJECT 1  GM  Intramuscular; To Be Done: [id number]; Status: HOLD FOR - Administration Ordered  2. Levothyroxine Sodium 175 MCG Oral Tablet;  Therapy: [id number] to Recorded  3. Lisinopril 40 MG Oral Tablet;  Therapy: [id number] to Recorded  4. MetFORMIN HCl - 500 MG Oral Tablet;  Therapy: [id number] to Recorded  5. OxyCODONE HCl - 15 MG Oral Tablet;  Therapy: [id number] to Recorded  Allergies Medication   1. Morphine Derivatives  2. Codeine Derivatives  Family History Problems   1. Family history of acute myocardial infarction (V17.3) : Maternal Grandfather  2. Family history of cerebrovascular accident (V17.1) : Father  3. Family history of kidney stones (V18.69) : Maternal Grandmother  4. Family history of  Hematuria : Maternal Grandmother  Social History Problems   1. Denied: History of Alcohol use  2. Caffeine use (V49.89)   1-2 a day of tea; 1 coffee  3. Disabled  4. Father deceased  49. Former cigarette smoker (V15.82)   one cigarette a day; quit 15 years ago  43. Former smoker (V15.82)  7. Married  8. Mother deceased  60. Number of children   2 daughters  Review of Systems  Genitourinary: urinary frequency, nocturia and incontinence (with urgency. ).  Constitutional: no fever.  Cardiovascular: leg swelling, but no chest pain.  Respiratory: shortness of breath.  Musculoskeletal: back pain.    Physical Exam Constitutional: Well nourished and well developed . No acute distress.  Pulmonary: No respiratory distress and normal respiratory rhythm and effort.  Cardiovascular: Heart rate and rhythm are normal . No peripheral edema.    Results/Data Urine [Data Includes: Last 1 Day]   [id number]  COLOR YELLOW   APPEARANCE CLEAR   SPECIFIC GRAVITY 1.025   pH 6.0   GLUCOSE NEG mg/dL  BILIRUBIN NEG   KETONE NEG mg/dL  BLOOD NEG   PROTEIN NEG mg/dL  UROBILINOGEN 0.2 mg/dL  NITRITE NEG   LEUKOCYTE ESTERASE TRACE   SQUAMOUS EPITHELIAL/HPF FEW   WBC 7-10 WBC/hpf  RBC 0-2 RBC/hpf  BACTERIA MODERATE   CRYSTALS NONE SEEN   CASTS Granular casts noted    The following images/tracing/specimen were independently visualized:  I have reviewed her CT films and report.  The following clinical  lab reports were reviewed:  UA reviewed. Selected Results  URINE CULTURE 23Dec2014 04:33PM Steele Berg  SOURCE : CLEAN CATCH SPECIMEN TYPE: URINE   Test Name Result Flag Reference  CULTURE, URINE Culture, Urine    ===== COLONY COUNT: =====  NO GROWTH   FINAL REPORT: NO GROWTH   AU CT-HEMATURIA PROTOCOL 23Dec2014 12:00AM Irine Seal   Test Name Result Flag Reference  AU CT-HEMATURIA PROTOCOL (Report)    ** RADIOLOGY REPORT BY [facility] **   CLINICAL DATA:  Microhematuria  EXAM: CT ABDOMEN AND PELVIS WITHOUT AND WITH CONTRAST  TECHNIQUE: Multidetector CT imaging of the abdomen and pelvis was performed without contrast material in one or both body regions, followed by contrast material(s) and further sections in one or both body regions.  CONTRAST: 125 cc of Isovue  COMPARISON: None  FINDINGS: The lung bases appear clear. No pleural or pericardial effusion identified. There is no focal liver abnormality identified. Previous cholecystectomy. The pancreas appears within normal limits. Normal appearance of the spleen.  The adrenal glands are both within normal limits. The left kidney appears normal. No left-sided nephrolithiasis or hydronephrosis identified. Cortical thinning and extensive scarring involves the right kidney. Stone within the right renal pelvis measures 1.8 cm, image 36/series 2. There is an adjacent stone which measures 5 mm. There is associated pelvocaliectasis involving the posterior renal calices, image 46/NGEXBM 3. There are several caliceal diverticula identified within the posterior right kidney. Focal area of dystrophic calcification is noted along the inferior right kidney, image 101/series 902. No significant hydroureter or ureteral lithiasis identified. The urinary bladder appears normal. The uterus and adnexal structures are unremarkable.  Postoperative changes involving the stomach are identified. This small bowel loops have a normal course and caliber and there is no obstruction. Normal appearance of the colon.  Review of the visualized bony structures is significant for mild multilevel lumbar degenerative disc disease.  IMPRESSION: 1. Right renal cortical scarring in volume loss. 2. Large stone is identified within the right renal pelvis causing distention of the posterior calices. 3. Normal appearance of the left kidney. 4. Prior cholecystectomy.   Electronically Signed  By: Kerby Moors  M.D.  On: 03/22/2013 16:25

## 2013-05-26 NOTE — Progress Notes (Signed)
Patient ID: Tricia Perry, female   DOB: 03-Jul-1955, 58 y.o.   MRN: [id number]   I spoke to Dr. Bartholome Bill who placed the nephrostomy tube this morning.  He was able to access the dilated calyx but couldn't get by the stone because of the patulous system and the impacted stone.     His recommendation is to delay the definitive procedure to allow decompression of the collecting symptoms which will confer a higher probability of success for antegrade catheter placement.     I will cancel the case and reschedule possibly next week.   If antegrade access is not possible on the next attempt, I will try to dislodge the stone from a retrograde approach to allow antegrade access in the OR.

## 2013-05-26 NOTE — Progress Notes (Signed)
Dr Jeffie Pollock here to see patient and discuss future plans for management of her kidney stone.

## 2013-05-26 NOTE — Anesthesia Preprocedure Evaluation (Addendum)
Anesthesia Evaluation  Patient identified by MRN, date of birth, ID band Patient awake    Reviewed: Allergy & Precautions, H&P , NPO status , Patient's Chart, lab work & pertinent test results  History of Anesthesia Complications Negative for: history of anesthetic complications  Airway Mallampati: III TM Distance: >3 FB Neck ROM: Full    Dental  (+) Dental Advisory Given   Pulmonary neg pulmonary ROS, shortness of breath, asthma , sleep apnea , former smoker,    Pulmonary exam normal       Cardiovascular hypertension, Pt. on medications + Valvular Problems/Murmurs Rhythm:Regular Rate:Normal  Echo 04/27/2013 - Left ventricle: The cavity size was normal. Wall thickness  was increased in a pattern of mild LVH. Systolic function  was normal. The estimated ejection fraction was in the  range of 60% to 65%. Wall motion was normal; there were no  regional wall motion abnormalities. Left ventricular  diastolic function parameters were normal. - Pulmonary arteries: PA peak pressure: 11mm Hg (S).    Neuro/Psych negative neurological ROS  negative psych ROS   GI/Hepatic Neg liver ROS, GERD-  ,  Endo/Other  negative endocrine ROSdiabetes, Type 2, Oral Hypoglycemic AgentsHypothyroidism Morbid obesity  Renal/GU Renal diseasenegative Renal ROS     Musculoskeletal  (+) Fibromyalgia -  Abdominal (+) + obese,   Peds  Hematology negative hematology ROS (+)   Anesthesia Other Findings   Reproductive/Obstetrics negative OB ROS                       Anesthesia Physical Anesthesia Plan  ASA: IV  Anesthesia Plan: General   Post-op Pain Management:    Induction: Intravenous  Airway Management Planned: Oral ETT  Additional Equipment:   Intra-op Plan:   Post-operative Plan: Extubation in OR  Informed Consent: I have reviewed the patients History and Physical, chart, labs and discussed the procedure including  the risks, benefits and alternatives for the proposed anesthesia with the patient or authorized representative who has indicated his/her understanding and acceptance.   Dental advisory given  Plan Discussed with: CRNA  Anesthesia Plan Comments:         Anesthesia Quick Evaluation

## 2013-05-26 NOTE — Procedures (Signed)
R PCN Attempted R nephroureteral calculus No comp

## 2013-05-27 ENCOUNTER — Other Ambulatory Visit: Payer: Self-pay | Admitting: Urology

## 2013-05-27 ENCOUNTER — Encounter (HOSPITAL_COMMUNITY): Payer: Self-pay | Admitting: *Deleted

## 2013-05-29 DIAGNOSIS — I8002 Phlebitis and thrombophlebitis of superficial vessels of left lower extremity: Secondary | ICD-10-CM

## 2013-05-29 DIAGNOSIS — Z86711 Personal history of pulmonary embolism: Secondary | ICD-10-CM

## 2013-05-29 DIAGNOSIS — I2699 Other pulmonary embolism without acute cor pulmonale: Secondary | ICD-10-CM | POA: Insufficient documentation

## 2013-05-29 HISTORY — DX: Personal history of pulmonary embolism: Z86.711

## 2013-05-29 HISTORY — DX: Phlebitis and thrombophlebitis of superficial vessels of left lower extremity: I80.02

## 2013-05-30 ENCOUNTER — Other Ambulatory Visit: Payer: Self-pay | Admitting: Urology

## 2013-05-30 ENCOUNTER — Other Ambulatory Visit: Payer: Self-pay | Admitting: Radiology

## 2013-05-30 DIAGNOSIS — N200 Calculus of kidney: Secondary | ICD-10-CM

## 2013-05-31 ENCOUNTER — Ambulatory Visit (HOSPITAL_COMMUNITY)
Admission: RE | Admit: 2013-05-31 | Discharge: 2013-05-31 | Disposition: A | Discharge status: Home / Self Care | Payer: 59 | Source: Ambulatory Visit | Attending: Urology | Admitting: Urology

## 2013-05-31 ENCOUNTER — Ambulatory Visit (HOSPITAL_COMMUNITY): Payer: 59

## 2013-05-31 ENCOUNTER — Encounter (HOSPITAL_COMMUNITY): Payer: Self-pay | Admitting: Certified Registered"

## 2013-05-31 ENCOUNTER — Encounter (HOSPITAL_COMMUNITY)
Admission: RE | Disposition: A | Discharge status: Home / Self Care | Payer: Self-pay | Source: Ambulatory Visit | Attending: Urology

## 2013-05-31 ENCOUNTER — Inpatient Hospital Stay (HOSPITAL_COMMUNITY)
Admission: RE | Admit: 2013-05-31 | Discharge: 2013-06-11 | DRG: 659 | Disposition: A | Discharge status: Home / Self Care | Payer: 59 | Source: Ambulatory Visit | Attending: Urology | Admitting: Urology

## 2013-05-31 ENCOUNTER — Encounter (HOSPITAL_COMMUNITY): Payer: 59 | Admitting: Anesthesiology

## 2013-05-31 ENCOUNTER — Encounter (HOSPITAL_COMMUNITY): Payer: Self-pay

## 2013-05-31 ENCOUNTER — Ambulatory Visit (HOSPITAL_COMMUNITY): Payer: 59 | Admitting: Anesthesiology

## 2013-05-31 ENCOUNTER — Encounter (HOSPITAL_COMMUNITY): Payer: Self-pay | Admitting: *Deleted

## 2013-05-31 VITALS — BP 115/69 | HR 84 | Temp 97.7°F | Resp 10 | Ht 64.0 in | Wt 305.0 lb

## 2013-05-31 DIAGNOSIS — G8929 Other chronic pain: Secondary | ICD-10-CM

## 2013-05-31 DIAGNOSIS — T380X5A Adverse effect of glucocorticoids and synthetic analogues, initial encounter: Secondary | ICD-10-CM | POA: Diagnosis present

## 2013-05-31 DIAGNOSIS — L02419 Cutaneous abscess of limb, unspecified: Secondary | ICD-10-CM | POA: Diagnosis present

## 2013-05-31 DIAGNOSIS — T81718A Complication of other artery following a procedure, not elsewhere classified, initial encounter: Secondary | ICD-10-CM

## 2013-05-31 DIAGNOSIS — Z6841 Body Mass Index (BMI) 40.0 and over, adult: Secondary | ICD-10-CM

## 2013-05-31 DIAGNOSIS — L03119 Cellulitis of unspecified part of limb: Secondary | ICD-10-CM

## 2013-05-31 DIAGNOSIS — E119 Type 2 diabetes mellitus without complications: Secondary | ICD-10-CM

## 2013-05-31 DIAGNOSIS — Z8249 Family history of ischemic heart disease and other diseases of the circulatory system: Secondary | ICD-10-CM

## 2013-05-31 DIAGNOSIS — R351 Nocturia: Secondary | ICD-10-CM | POA: Diagnosis present

## 2013-05-31 DIAGNOSIS — E039 Hypothyroidism, unspecified: Secondary | ICD-10-CM | POA: Diagnosis present

## 2013-05-31 DIAGNOSIS — R3129 Other microscopic hematuria: Secondary | ICD-10-CM | POA: Diagnosis present

## 2013-05-31 DIAGNOSIS — D72829 Elevated white blood cell count, unspecified: Secondary | ICD-10-CM | POA: Diagnosis present

## 2013-05-31 DIAGNOSIS — N200 Calculus of kidney: Secondary | ICD-10-CM

## 2013-05-31 DIAGNOSIS — Z885 Allergy status to narcotic agent status: Secondary | ICD-10-CM

## 2013-05-31 DIAGNOSIS — G4700 Insomnia, unspecified: Secondary | ICD-10-CM | POA: Diagnosis present

## 2013-05-31 DIAGNOSIS — I2699 Other pulmonary embolism without acute cor pulmonale: Secondary | ICD-10-CM | POA: Diagnosis not present

## 2013-05-31 DIAGNOSIS — E8881 Metabolic syndrome: Secondary | ICD-10-CM | POA: Diagnosis present

## 2013-05-31 DIAGNOSIS — IMO0002 Reserved for concepts with insufficient information to code with codable children: Secondary | ICD-10-CM | POA: Diagnosis present

## 2013-05-31 DIAGNOSIS — R350 Frequency of micturition: Secondary | ICD-10-CM | POA: Diagnosis present

## 2013-05-31 DIAGNOSIS — G4730 Sleep apnea, unspecified: Secondary | ICD-10-CM | POA: Diagnosis present

## 2013-05-31 DIAGNOSIS — J9589 Other postprocedural complications and disorders of respiratory system, not elsewhere classified: Secondary | ICD-10-CM | POA: Diagnosis not present

## 2013-05-31 DIAGNOSIS — F411 Generalized anxiety disorder: Secondary | ICD-10-CM | POA: Diagnosis present

## 2013-05-31 DIAGNOSIS — K219 Gastro-esophageal reflux disease without esophagitis: Secondary | ICD-10-CM | POA: Diagnosis present

## 2013-05-31 DIAGNOSIS — I8000 Phlebitis and thrombophlebitis of superficial vessels of unspecified lower extremity: Secondary | ICD-10-CM | POA: Diagnosis not present

## 2013-05-31 DIAGNOSIS — R011 Cardiac murmur, unspecified: Secondary | ICD-10-CM | POA: Diagnosis present

## 2013-05-31 DIAGNOSIS — IMO0001 Reserved for inherently not codable concepts without codable children: Secondary | ICD-10-CM | POA: Diagnosis present

## 2013-05-31 DIAGNOSIS — I8390 Asymptomatic varicose veins of unspecified lower extremity: Secondary | ICD-10-CM | POA: Diagnosis present

## 2013-05-31 DIAGNOSIS — N3946 Mixed incontinence: Secondary | ICD-10-CM | POA: Diagnosis present

## 2013-05-31 DIAGNOSIS — Y838 Other surgical procedures as the cause of abnormal reaction of the patient, or of later complication, without mention of misadventure at the time of the procedure: Secondary | ICD-10-CM | POA: Diagnosis not present

## 2013-05-31 DIAGNOSIS — Z87442 Personal history of urinary calculi: Secondary | ICD-10-CM

## 2013-05-31 DIAGNOSIS — Z79899 Other long term (current) drug therapy: Secondary | ICD-10-CM

## 2013-05-31 DIAGNOSIS — J45909 Unspecified asthma, uncomplicated: Secondary | ICD-10-CM | POA: Diagnosis present

## 2013-05-31 DIAGNOSIS — D649 Anemia, unspecified: Secondary | ICD-10-CM | POA: Diagnosis present

## 2013-05-31 DIAGNOSIS — I10 Essential (primary) hypertension: Secondary | ICD-10-CM

## 2013-05-31 DIAGNOSIS — Z9089 Acquired absence of other organs: Secondary | ICD-10-CM

## 2013-05-31 DIAGNOSIS — Z87891 Personal history of nicotine dependence: Secondary | ICD-10-CM

## 2013-05-31 DIAGNOSIS — I803 Phlebitis and thrombophlebitis of lower extremities, unspecified: Secondary | ICD-10-CM | POA: Diagnosis present

## 2013-05-31 DIAGNOSIS — G5600 Carpal tunnel syndrome, unspecified upper limb: Secondary | ICD-10-CM | POA: Diagnosis present

## 2013-05-31 DIAGNOSIS — I872 Venous insufficiency (chronic) (peripheral): Secondary | ICD-10-CM

## 2013-05-31 DIAGNOSIS — Z833 Family history of diabetes mellitus: Secondary | ICD-10-CM

## 2013-05-31 DIAGNOSIS — Z96659 Presence of unspecified artificial knee joint: Secondary | ICD-10-CM

## 2013-05-31 DIAGNOSIS — M129 Arthropathy, unspecified: Secondary | ICD-10-CM | POA: Diagnosis present

## 2013-05-31 DIAGNOSIS — R0602 Shortness of breath: Secondary | ICD-10-CM

## 2013-05-31 HISTORY — PX: NEPHROLITHOTOMY: SHX5134

## 2013-05-31 LAB — GLUCOSE, CAPILLARY
Glucose-Capillary: 113 mg/dL — ABNORMAL HIGH (ref 70–99)
Glucose-Capillary: 88 mg/dL (ref 70–99)

## 2013-05-31 LAB — BASIC METABOLIC PANEL
BUN: 17 mg/dL (ref 6–23)
CALCIUM: 9.6 mg/dL (ref 8.4–10.5)
CHLORIDE: 100 meq/L (ref 96–112)
CO2: 26 mEq/L (ref 19–32)
CREATININE: 0.66 mg/dL (ref 0.50–1.10)
GFR calc non Af Amer: >90 mL/min (ref >90)
Glucose, Bld: 117 mg/dL — ABNORMAL HIGH (ref 70–99)
Potassium: 4.3 mEq/L (ref 3.7–5.3)
Sodium: 138 mEq/L (ref 137–147)

## 2013-05-31 LAB — CBC
HEMATOCRIT: 37.8 % (ref 36.0–46.0)
Hemoglobin: 12.5 g/dL (ref 12.0–15.0)
MCH: 29.9 pg (ref 26.0–34.0)
MCHC: 33.1 g/dL (ref 30.0–36.0)
MCV: 90.4 fL (ref 78.0–100.0)
PLATELETS: 294 10*3/uL (ref 150–400)
RBC: 4.18 MIL/uL (ref 3.87–5.11)
RDW: 13.7 % (ref 11.5–15.5)
WBC: 6.2 10*3/uL (ref 4.0–10.5)

## 2013-05-31 LAB — HEMOGLOBIN AND HEMATOCRIT, BLOOD
HCT: 37.9 % (ref 36.0–46.0)
Hemoglobin: 12.5 g/dL (ref 12.0–15.0)

## 2013-05-31 LAB — TYPE AND SCREEN
ABO/RH(D): A POS
Antibody Screen: NEGATIVE

## 2013-05-31 LAB — PROTIME-INR
INR: 0.87 (ref 0.00–1.49)
Prothrombin Time: 11.7 seconds (ref 11.6–15.2)

## 2013-05-31 LAB — APTT: aPTT: 29 seconds (ref 24–37)

## 2013-05-31 SURGERY — NEPHROLITHOTOMY PERCUTANEOUS
Anesthesia: General | Laterality: Right

## 2013-05-31 MED ORDER — PROPOFOL 10 MG/ML IV BOLUS
INTRAVENOUS | Status: AC
  Filled 2013-05-31: qty 20

## 2013-05-31 MED ORDER — PROMETHAZINE HCL 25 MG/ML IJ SOLN: 6.2500 mg | INTRAMUSCULAR | Status: DC | PRN

## 2013-05-31 MED ORDER — KCL IN DEXTROSE-NACL 20-5-0.45 MEQ/L-%-% IV SOLN
INTRAVENOUS | Status: DC
  Administered 2013-05-31 – 2013-06-01 (×2): via INTRAVENOUS
  Filled 2013-05-31 (×4): qty 1000

## 2013-05-31 MED ORDER — PROPOFOL 10 MG/ML IV BOLUS
INTRAVENOUS | Status: DC | PRN
  Administered 2013-05-31 (×2): 50 mg via INTRAVENOUS
  Administered 2013-05-31: 150 mg via INTRAVENOUS

## 2013-05-31 MED ORDER — DIPHENHYDRAMINE HCL 12.5 MG/5ML PO ELIX
12.5000 mg | ORAL_SOLUTION | Freq: Four times a day (QID) | ORAL | Status: DC | PRN
  Administered 2013-06-05: 12.5 mg via ORAL
  Filled 2013-05-31: qty 10

## 2013-05-31 MED ORDER — CIPROFLOXACIN IN D5W 400 MG/200ML IV SOLN
INTRAVENOUS | Status: AC
  Administered 2013-05-31: 400 mg
  Filled 2013-05-31: qty 200

## 2013-05-31 MED ORDER — CIPROFLOXACIN IN D5W 400 MG/200ML IV SOLN: 400.0000 mg | INTRAVENOUS | Status: DC

## 2013-05-31 MED ORDER — ONDANSETRON HCL 4 MG/2ML IJ SOLN
INTRAMUSCULAR | Status: AC
  Filled 2013-05-31: qty 2

## 2013-05-31 MED ORDER — DEXAMETHASONE SODIUM PHOSPHATE 10 MG/ML IJ SOLN
INTRAMUSCULAR | Status: DC | PRN
  Administered 2013-05-31: 10 mg via INTRAVENOUS

## 2013-05-31 MED ORDER — OXYCODONE HCL 5 MG PO TABS
15.0000 mg | ORAL_TABLET | Freq: Four times a day (QID) | ORAL | Status: DC
  Administered 2013-05-31 – 2013-06-11 (×44): 15 mg via ORAL
  Filled 2013-05-31 (×44): qty 3

## 2013-05-31 MED ORDER — MIDAZOLAM HCL 2 MG/2ML IJ SOLN
INTRAMUSCULAR | Status: AC
  Filled 2013-05-31: qty 6

## 2013-05-31 MED ORDER — HYDROMORPHONE HCL PF 1 MG/ML IJ SOLN
INTRAMUSCULAR | Status: AC
  Filled 2013-05-31: qty 1

## 2013-05-31 MED ORDER — MIDAZOLAM HCL 2 MG/2ML IJ SOLN
INTRAMUSCULAR | Status: AC
  Filled 2013-05-31: qty 2

## 2013-05-31 MED ORDER — IOHEXOL 300 MG/ML  SOLN
INTRAMUSCULAR | Status: DC | PRN
  Administered 2013-05-31: 10 mL

## 2013-05-31 MED ORDER — LACTATED RINGERS IV SOLN
INTRAVENOUS | Status: DC
  Administered 2013-05-31 (×2): via INTRAVENOUS

## 2013-05-31 MED ORDER — CIPROFLOXACIN HCL 500 MG PO TABS
500.0000 mg | ORAL_TABLET | Freq: Two times a day (BID) | ORAL | Status: DC
  Administered 2013-05-31 – 2013-06-02 (×4): 500 mg via ORAL
  Filled 2013-05-31 (×6): qty 1

## 2013-05-31 MED ORDER — METFORMIN HCL 500 MG PO TABS
500.0000 mg | ORAL_TABLET | Freq: Every day | ORAL | Status: DC
  Administered 2013-05-31 – 2013-06-10 (×10): 500 mg via ORAL
  Filled 2013-05-31 (×12): qty 1

## 2013-05-31 MED ORDER — HYDROMORPHONE HCL PF 1 MG/ML IJ SOLN
0.2500 mg | INTRAMUSCULAR | Status: DC | PRN
  Administered 2013-05-31 (×4): 0.5 mg via INTRAVENOUS

## 2013-05-31 MED ORDER — IOHEXOL 300 MG/ML  SOLN
10.0000 mL | Freq: Once | INTRAMUSCULAR | Status: AC | PRN
  Administered 2013-05-31: 15 mL

## 2013-05-31 MED ORDER — SUCCINYLCHOLINE CHLORIDE 20 MG/ML IJ SOLN
INTRAMUSCULAR | Status: DC | PRN
  Administered 2013-05-31: 140 mg via INTRAVENOUS

## 2013-05-31 MED ORDER — FENTANYL CITRATE 0.05 MG/ML IJ SOLN
INTRAMUSCULAR | Status: AC | PRN
  Administered 2013-05-31 (×3): 100 ug via INTRAVENOUS
  Administered 2013-05-31 (×2): 50 ug via INTRAVENOUS
  Administered 2013-05-31 (×2): 100 ug via INTRAVENOUS

## 2013-05-31 MED ORDER — LEVOTHYROXINE SODIUM 175 MCG PO TABS
175.0000 ug | ORAL_TABLET | Freq: Every day | ORAL | Status: DC
  Administered 2013-06-01 – 2013-06-11 (×11): 175 ug via ORAL
  Filled 2013-05-31 (×13): qty 1

## 2013-05-31 MED ORDER — ONDANSETRON HCL 4 MG/2ML IJ SOLN
4.0000 mg | INTRAMUSCULAR | Status: DC | PRN
Start: 2013-05-31 — End: 2013-06-11
  Administered 2013-06-01 – 2013-06-10 (×6): 4 mg via INTRAVENOUS
  Filled 2013-05-31 (×7): qty 2

## 2013-05-31 MED ORDER — DOCUSATE SODIUM 100 MG PO CAPS
100.0000 mg | ORAL_CAPSULE | Freq: Two times a day (BID) | ORAL | Status: DC
  Administered 2013-05-31 – 2013-06-11 (×20): 100 mg via ORAL
  Filled 2013-05-31 (×24): qty 1

## 2013-05-31 MED ORDER — PHENYLEPHRINE HCL 10 MG/ML IJ SOLN
INTRAMUSCULAR | Status: DC | PRN
  Administered 2013-05-31 (×3): 80 ug via INTRAVENOUS

## 2013-05-31 MED ORDER — ACETAMINOPHEN 325 MG PO TABS
650.0000 mg | ORAL_TABLET | ORAL | Status: DC | PRN
  Administered 2013-06-01 – 2013-06-04 (×5): 650 mg via ORAL
  Filled 2013-05-31 (×5): qty 2

## 2013-05-31 MED ORDER — MEPERIDINE HCL 50 MG/ML IJ SOLN: 6.2500 mg | INTRAMUSCULAR | Status: DC | PRN

## 2013-05-31 MED ORDER — BISACODYL 10 MG RE SUPP
10.0000 mg | Freq: Every day | RECTAL | Status: DC | PRN
  Administered 2013-06-02: 10 mg via RECTAL
  Filled 2013-05-31: qty 1

## 2013-05-31 MED ORDER — PHENYLEPHRINE 40 MCG/ML (10ML) SYRINGE FOR IV PUSH (FOR BLOOD PRESSURE SUPPORT)
PREFILLED_SYRINGE | INTRAVENOUS | Status: AC
  Filled 2013-05-31: qty 10

## 2013-05-31 MED ORDER — SODIUM CHLORIDE 0.9 % IR SOLN
Status: DC | PRN
  Administered 2013-05-31: 6000 mL

## 2013-05-31 MED ORDER — DIPHENHYDRAMINE HCL 50 MG/ML IJ SOLN: 12.5000 mg | Freq: Four times a day (QID) | INTRAMUSCULAR | Status: DC | PRN

## 2013-05-31 MED ORDER — HYDROMORPHONE HCL PF 1 MG/ML IJ SOLN: 0.2500 mg | INTRAMUSCULAR | Status: DC | PRN

## 2013-05-31 MED ORDER — MIDAZOLAM HCL 5 MG/5ML IJ SOLN
INTRAMUSCULAR | Status: DC | PRN
  Administered 2013-05-31: 2 mg via INTRAVENOUS

## 2013-05-31 MED ORDER — LISINOPRIL 40 MG PO TABS
40.0000 mg | ORAL_TABLET | Freq: Every morning | ORAL | Status: DC
  Administered 2013-06-01 – 2013-06-11 (×11): 40 mg via ORAL
  Filled 2013-05-31 (×11): qty 1

## 2013-05-31 MED ORDER — HYDROMORPHONE HCL PF 1 MG/ML IJ SOLN
1.0000 mg | INTRAMUSCULAR | Status: DC | PRN
  Administered 2013-05-31 – 2013-06-03 (×17): 1 mg via INTRAVENOUS
  Filled 2013-05-31 (×17): qty 1

## 2013-05-31 MED ORDER — LIDOCAINE HCL 1 % IJ SOLN
INTRAMUSCULAR | Status: AC
  Filled 2013-05-31: qty 20

## 2013-05-31 MED ORDER — LACTATED RINGERS IV SOLN: INTRAVENOUS | Status: DC

## 2013-05-31 MED ORDER — FENTANYL CITRATE 0.05 MG/ML IJ SOLN
INTRAMUSCULAR | Status: DC | PRN
  Administered 2013-05-31: 100 ug via INTRAVENOUS

## 2013-05-31 MED ORDER — LACTATED RINGERS IV SOLN
INTRAVENOUS | Status: DC
  Administered 2013-05-31: 1000 mL via INTRAVENOUS

## 2013-05-31 MED ORDER — FENTANYL CITRATE 0.05 MG/ML IJ SOLN
INTRAMUSCULAR | Status: AC
  Filled 2013-05-31: qty 6

## 2013-05-31 MED ORDER — LIDOCAINE HCL (CARDIAC) 20 MG/ML IV SOLN
INTRAVENOUS | Status: DC | PRN
  Administered 2013-05-31: 50 mg via INTRAVENOUS

## 2013-05-31 MED ORDER — CIPROFLOXACIN IN D5W 400 MG/200ML IV SOLN
400.0000 mg | INTRAVENOUS | Status: DC
Start: 2013-05-31 — End: 2013-05-31

## 2013-05-31 MED ORDER — ROCURONIUM BROMIDE 100 MG/10ML IV SOLN
INTRAVENOUS | Status: AC
  Filled 2013-05-31: qty 1

## 2013-05-31 MED ORDER — FENTANYL CITRATE 0.05 MG/ML IJ SOLN
INTRAMUSCULAR | Status: AC
  Filled 2013-05-31: qty 2

## 2013-05-31 MED ORDER — ONDANSETRON HCL 4 MG/2ML IJ SOLN
INTRAMUSCULAR | Status: DC | PRN
  Administered 2013-05-31: 4 mg via INTRAVENOUS

## 2013-05-31 MED ORDER — MIDAZOLAM HCL 2 MG/2ML IJ SOLN
INTRAMUSCULAR | Status: AC | PRN
  Administered 2013-05-31 (×2): 1 mg via INTRAVENOUS
  Administered 2013-05-31: 2 mg via INTRAVENOUS
  Administered 2013-05-31 (×2): 1 mg via INTRAVENOUS
  Administered 2013-05-31: 2 mg via INTRAVENOUS
  Administered 2013-05-31: 1 mg via INTRAVENOUS

## 2013-05-31 MED ORDER — HYOSCYAMINE SULFATE 0.125 MG SL SUBL
0.1250 mg | SUBLINGUAL_TABLET | SUBLINGUAL | Status: DC | PRN
  Administered 2013-05-31 – 2013-06-06 (×5): 0.125 mg via ORAL
  Filled 2013-05-31 (×5): qty 1

## 2013-05-31 MED ORDER — DEXAMETHASONE SODIUM PHOSPHATE 10 MG/ML IJ SOLN
INTRAMUSCULAR | Status: AC
  Filled 2013-05-31: qty 1

## 2013-05-31 MED ORDER — OXYCODONE HCL 5 MG PO TABS
5.0000 mg | ORAL_TABLET | ORAL | Status: DC | PRN
  Administered 2013-06-01 – 2013-06-08 (×9): 5 mg via ORAL
  Filled 2013-05-31 (×10): qty 1

## 2013-05-31 SURGICAL SUPPLY — 52 items
APL SKNCLS STERI-STRIP NONHPOA (GAUZE/BANDAGES/DRESSINGS) ×4
BAG URINE DRAINAGE (UROLOGICAL SUPPLIES) ×3 IMPLANT
BAG URO CATCHER STRL LF (DRAPE) ×3 IMPLANT
BASKET ZERO TIP NITINOL 2.4FR (BASKET) ×3 IMPLANT
BENZOIN TINCTURE PRP APPL 2/3 (GAUZE/BANDAGES/DRESSINGS) ×12 IMPLANT
BLADE SURG 15 STRL LF DISP TIS (BLADE) ×1 IMPLANT
BLADE SURG 15 STRL SS (BLADE) ×3
BSKT STON RTRVL ZERO TP 2.4FR (BASKET) ×1
CARTRIDGE STONEBREAK CO2 KIDNE (ELECTROSURGICAL) ×4 IMPLANT
CATCHER STONE W/TUBE ADAPTER (UROLOGICAL SUPPLIES) ×1 IMPLANT
CATH AINSWORTH 30CC 24FR (CATHETERS) ×2 IMPLANT
CATH BEACON 5.038 65CM KMP-01 (CATHETERS) ×2 IMPLANT
CATH FOLEY 2W COUNCIL 20FR 5CC (CATHETERS) IMPLANT
CATH ROBINSON RED A/P 20FR (CATHETERS) IMPLANT
CATH URET 5FR 28IN OPEN ENDED (CATHETERS) IMPLANT
CATH X-FORCE N30 NEPHROSTOMY (TUBING) ×3 IMPLANT
COVER SURGICAL LIGHT HANDLE (MISCELLANEOUS) ×3 IMPLANT
DRAPE C-ARM 42X120 X-RAY (DRAPES) ×3 IMPLANT
DRAPE CAMERA CLOSED 9X96 (DRAPES) ×3 IMPLANT
DRAPE LINGEMAN PERC (DRAPES) ×3 IMPLANT
DRAPE SURG IRRIG POUCH 19X23 (DRAPES) ×3 IMPLANT
DRSG TEGADERM 8X12 (GAUZE/BANDAGES/DRESSINGS) ×4 IMPLANT
FIBER LASER FLEXIVA 550 (UROLOGICAL SUPPLIES) IMPLANT
GLOVE SURG SS PI 8.0 STRL IVOR (GLOVE) IMPLANT
GOWN STRL REUS W/TWL XL LVL3 (GOWN DISPOSABLE) ×3 IMPLANT
GUIDEWIRE AMPLATZ STIFF 0.35 (WIRE) ×4 IMPLANT
KIT BASIN OR (CUSTOM PROCEDURE TRAY) ×3 IMPLANT
LASER FIBER DISP 1000U (UROLOGICAL SUPPLIES) IMPLANT
MANIFOLD NEPTUNE II (INSTRUMENTS) ×3 IMPLANT
NS IRRIG 1000ML POUR BTL (IV SOLUTION) ×3 IMPLANT
PACK BASIC VI WITH GOWN DISP (CUSTOM PROCEDURE TRAY) ×3 IMPLANT
PACK CYSTO (CUSTOM PROCEDURE TRAY) ×3 IMPLANT
PAD ABD 7.5X8 STRL (GAUZE/BANDAGES/DRESSINGS) ×6 IMPLANT
PAD ABD 8X10 STRL (GAUZE/BANDAGES/DRESSINGS) ×2 IMPLANT
PROBE KIDNEY STONEBRKR 2.0X425 (ELECTROSURGICAL) ×2 IMPLANT
PROBE LITHOCLAST ULTRA 3.8X403 (UROLOGICAL SUPPLIES) ×3 IMPLANT
PROBE PNEUMATIC 1.0MMX570MM (UROLOGICAL SUPPLIES) ×3 IMPLANT
SET IRRIG Y TYPE TUR BLADDER L (SET/KITS/TRAYS/PACK) ×3 IMPLANT
SET WARMING FLUID IRRIGATION (MISCELLANEOUS) ×3 IMPLANT
SHEATH PEELAWAY SET 9 (SHEATH) ×2 IMPLANT
SHEATH X FORCE 10MMX22CM (SHEATH) ×2 IMPLANT
SPONGE GAUZE 4X4 12PLY (GAUZE/BANDAGES/DRESSINGS) ×3 IMPLANT
SPONGE LAP 4X18 X RAY DECT (DISPOSABLE) ×3 IMPLANT
STONE CATCHER W/TUBE ADAPTER (UROLOGICAL SUPPLIES) ×3 IMPLANT
SUT SILK 2 0 30  PSL (SUTURE) ×2
SUT SILK 2 0 30 PSL (SUTURE) ×1 IMPLANT
SYR 20CC LL (SYRINGE) ×6 IMPLANT
SYRINGE 10CC LL (SYRINGE) ×3 IMPLANT
TOWEL OR NON WOVEN STRL DISP B (DISPOSABLE) ×3 IMPLANT
TRAY FOLEY CATH 14FRSI W/METER (CATHETERS) ×3 IMPLANT
TUBING CONNECTING 10 (TUBING) ×6 IMPLANT
TUBING CONNECTING 10' (TUBING) ×3

## 2013-05-31 NOTE — Preoperative (Signed)
Beta Blockers   Reason not to administer Beta Blockers:Not Applicable 

## 2013-05-31 NOTE — Transfer of Care (Signed)
Immediate Anesthesia Transfer of Care Note  Patient: Tricia Perry  Procedure(s) Performed: Procedure(s): NEPHROLITHOTOMY PERCUTANEOUS (Right)  Patient Location: PACU  Anesthesia Type:General  Level of Consciousness: sedated  Airway & Oxygen Therapy: Patient Spontanous Breathing and Patient connected to face mask oxygen  Post-op Assessment: Report given to PACU RN and Post -op Vital signs reviewed and stable  Post vital signs: Reviewed and stable  Complications: No apparent anesthesia complications

## 2013-05-31 NOTE — Progress Notes (Signed)
Hgb. And Hct. Results noted. 

## 2013-05-31 NOTE — Brief Op Note (Signed)
05/31/2013  12:58 PM  PATIENT:  Tricia Perry  58 y.o. female  PRE-OPERATIVE DIAGNOSIS:  RIGHT RENAL STONE <2cm  POST-OPERATIVE DIAGNOSIS:  RIGHT RENAL STONE <2cm  PROCEDURE:  Procedure(s): NEPHROLITHOTOMY PERCUTANEOUS (Right) <2cm  SURGEON:  Surgeon(s) and Role:    * Irine Seal, MD - Primary  PHYSICIAN ASSISTANT:   ASSISTANTS: none   ANESTHESIA:   general  EBL:  Total I/O In: 1000 [I.V.:1000] Out: -   BLOOD ADMINISTERED:none  DRAINS: Urinary Catheter (Foley) and 59fr right NT and 74fr safety catheter.    LOCAL MEDICATIONS USED:  NONE  SPECIMEN:  Source of Specimen:  right renal stone  DISPOSITION OF SPECIMEN:  to family to bring to office  COUNTS:  YES  TOURNIQUET:  * No tourniquets in log *  DICTATION: .Other Dictation: Dictation Number [id number]  PLAN OF CARE: Admit for overnight observation  PATIENT DISPOSITION:  PACU - hemodynamically stable.   Delay start of Pharmacological VTE agent (>24hrs) due to surgical blood loss or risk of bleeding: yes

## 2013-05-31 NOTE — Progress Notes (Signed)
HPI: Tricia Perry is an 58 y.o. female with obstructing right renal stone. Had PCN last week with unsuccessful attempt at nephroureteral catheter. Decision was made to allow for decompression and re-attempt nephroureteral cath today with plan for OR to follow if successful. She denies anything new since last week. Does c/o some mild soreness at drain site.  Past Medical History:  Past Medical History  Diagnosis Date  . Insulin resistance   . Hypertension   . Hypothyroidism   . 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 DDD (degenerative disc disease)     Dr. [name] evaluated her and recommended pain mgmt MD.  She saw Dr. Selinda Orion for pain mgmt at one time.  Marland Kitchen GERD (gastroesophageal reflux disease)   . Insomnia   . Morbid obesity     BMI 49.  Gastric stapling surgery in distant past.  . Peripheral neuropathy     No old records to confirm pt's report.  Pt refuses to have more NCS b/c test is painful.  . Carpal tunnel syndrome     bilateral  . Fibromyalgia     questionable  . Nephrolithiasis     Lithotripsy 2002.  1.8 cm stone in right renal pelvis 02/2013--will likely get this removed by Dr. Jeffie Pollock  . Disequilibrium syndrome     MRI brain normal 2012  . DIZZINESS 04/12/2010    Qualifier: Diagnosis of  By: Anitra Lauth M.D., Brien Few   . Carpal tunnel syndrome on both sides 06/21/2010  . Mixed incontinence urge and stress   . Microscopic hematuria 2014    CT abd pelv showed 1.8 cm right renal pelvis stone.  Also had UA c/w UTI so abx given.   . Shoulder pain     HX OF BILATERAL SHOULDER SURGERIES - PT HAS VERY LIMITED ROM - ESPECIALLY RAISING HER ARMS  . Complication of anesthesia     STATES SHE WAS TOLD SHE WOKE UP DURING HER KNEE REPLACEMENT SURGERY AND HAD TO BE GIVEN MORE MEDICINE - NOT SURE IF SPINAL OR GENERAL ANESTHESIA  . Chronic venous insufficiency     with edema  and extensive varicose dz: Dr. Linus Mako is managing this, planning laser procedures.;  A LOT OF LEG CRAMPS AND LEG STIFFNESS  . Asthma     NOT NEEDED INHALER IN OVER A YEAR  . Heart murmur     functional, w/u done; PALPITATIONS IN THE PAST  . Sleep apnea     PT DOES NOT HAVE MASK OR TUBING - JUST SLEEPS WITH HEAD ELEVATED ON 2 PILLOWS   . Diabetes mellitus without complication     ORAL MEDICATION  . Urinary frequency     AND NOCTURIA - UP AT NIGHT 5 OR 6 TIMES TO BATHROOM    Past Surgical History:  Past Surgical History  Procedure Laterality Date  . Cholecystectomy  1987  . Joint replacement  2009    Right Knee, [facility]  . Foot surgery  2000    Left tarsel tunnel release  . Hernia repair   1980's    Diaphragmatic + gastric stapling  . Hernia repair      Inguinal  . Extracorporeal shock wave lithotripsy    . Endometrial ablation      menorrhagia  . Bilateral shoulder surgery      Family History:  Family History  Problem Relation Age of Onset  . Hypertension Mother   . Cancer Mother     Brain/Lung/ smoker  .  Diabetes Mother   . Stroke Father   . ADD / ADHD Daughter     ADHD  . Thyroid disease Brother   . Heart attack Maternal Grandfather   . Alcohol abuse Paternal Grandfather     Social History:  reports that she quit smoking about 38 years ago. Her smoking use included Cigarettes. She smoked 0.00 packs per day for 5 years. She has never used smokeless tobacco. She reports that she drinks alcohol. She reports that she does not use illicit drugs.  Allergies:  Allergies  Allergen Reactions  . Codeine Itching    Back hurts  . Mold Extract [Trichophyton] Nausea And Vomiting and Other (See Comments)    Sneezing real bad   . Morphine Itching    back hurts    Medications:   Medication List    Notice   This visit is during an admission. Changes to the med list made in this visit will be reflected in the After Visit Summary of the admission.      Please HPI for pertinent positives, otherwise complete 10 system ROS negative.  Physical  Exam: BP 155/57  Pulse 84  Temp(Src) 97.7 F (36.5 C) (Oral)  Resp 20  SpO2 100% There is no weight on file to calculate BMI.   General Appearance:  Alert, cooperative, no distress, appears stated age  Head:  Normocephalic, without obvious abnormality, atraumatic  ENT: Unremarkable  Neck: Supple, symmetrical, trachea midline  Lungs:   Clear to auscultation bilaterally, no w/r/r, respirations unlabored without use of accessory muscles.  Chest Wall:  No tenderness or deformity  Heart:  Regular rate and rhythm, S1, S2 normal, no murmur, rub or gallop.  Abdomen:   Soft, non-tender, non distended.  Extremities: Extremities normal, atraumatic, no cyanosis or edema  Pulses: 2+ and symmetric  Neurologic: Normal affect, no gross deficits.   CBC    Component Value Date/Time   WBC 6.2 05/31/2013 0800   RBC 4.18 05/31/2013 0800   HGB 12.5 05/31/2013 0800   HCT 37.8 05/31/2013 0800   PLT 294 05/31/2013 0800   MCV 90.4 05/31/2013 0800   MCH 29.9 05/31/2013 0800   MCHC 33.1 05/31/2013 0800   RDW 13.7 05/31/2013 0800   LYMPHSABS 2.0 04/20/2013 1347   MONOABS 0.5 04/20/2013 1347   EOSABS 0.3 04/20/2013 1347   BASOSABS 0.1 04/20/2013 1347    Prothrombin Time/INR 11.7/0.87  Assessment/Plan Obstrcuting right renal stone For exchange of (R)PCN to nephroureteral cath and subsequent OR Labs reviewed. Reviewed procedure with pt, risks and complications.  Consent signed in chart  Ascencion Dike PA-C 05/31/2013, 8:43 AM

## 2013-05-31 NOTE — Interval H&P Note (Signed)
History and Physical Interval Note:  The nephrostomy tube was successfully placed by the stone into the ureter.     05/31/2013 10:54 AM  Tricia Perry  has presented today for surgery, with the diagnosis of RIGHT RENAL STONE  The various methods of treatment have been discussed with the patient and family. After consideration of risks, benefits and other options for treatment, the patient has consented to  Procedure(s): NEPHROLITHOTOMY PERCUTANEOUS (Right) HOLMIUM LASER APPLICATION (Right) as a surgical intervention .  The patient's history has been reviewed, patient examined, no change in status, stable for surgery.  I have reviewed the patient's chart and labs.  Questions were answered to the patient's satisfaction.     Kimball Manske J

## 2013-05-31 NOTE — H&P (View-Only) (Signed)
 Problems   1. Kidney stone on right side (592.0)  2. Microscopic hematuria (599.72)  3. Nocturia (788.43)  4. Urinary tract infection (599.0)  5. Urinary urgency (788.63)  History of Present Illness  Tricia Perry returns today in f/u to discuss treatment of the 2.3cm stone found on her recent CT for the evaluation of hematuria.   She doesn't have much flank pain or hematuria but has had increased frequency and nocturia and her UA has pyuria again today.   She is currently being evaluated by Dr. McGowen from some swelling in the RLE and SOB with an elevated D-dimer.   Past Medical History Problems   1. History of arthritis (V13.4)  2. History of asthma (V12.69)  3. History of cardiac murmur (V12.59)  4. History of diabetes mellitus (V12.29)  5. History of hypertension (V12.59)  6. History of hypothyroidism (V12.29)  7. History of renal calculi (V13.01)  8. History of sleep apnea (V13.89)  Surgical History Problems   1. History of Cholecystectomy  2. History of Foot Surgery  3. History of Hernia Repair  4. History of Knee Replacement  5. History of Lithotripsy  6. History of Rotator Cuff Repair  Current Meds  1. CefTRIAXone Sodium 1 GM Injection Solution Reconstituted; INJECT 1  GM  Intramuscular; To Be Done: 23Dec2014; Status: HOLD FOR - Administration Ordered  2. Levothyroxine Sodium 175 MCG Oral Tablet;  Therapy: 31Jul2014 to Recorded  3. Lisinopril 40 MG Oral Tablet;  Therapy: 31Jul2014 to Recorded  4. MetFORMIN HCl - 500 MG Oral Tablet;  Therapy: 31Jul2014 to Recorded  5. OxyCODONE HCl - 15 MG Oral Tablet;  Therapy: 09Jun2014 to Recorded  Allergies Medication   1. Morphine Derivatives  2. Codeine Derivatives  Family History Problems   1. Family history of acute myocardial infarction (V17.3) : Maternal Grandfather  2. Family history of cerebrovascular accident (V17.1) : Father  3. Family history of kidney stones (V18.69) : Maternal Grandmother  4. Family history of  Hematuria : Maternal Grandmother  Social History Problems   1. Denied: History of Alcohol use  2. Caffeine use (V49.89)   1-2 a day of tea; 1 coffee  3. Disabled  4. Father deceased  5. Former cigarette smoker (V15.82)   one cigarette a day; quit 15 years ago  6. Former smoker (V15.82)  7. Married  8. Mother deceased  9. Number of children   2 daughters  Review of Systems  Genitourinary: urinary frequency, nocturia and incontinence (with urgency. ).  Constitutional: no fever.  Cardiovascular: leg swelling, but no chest pain.  Respiratory: shortness of breath.  Musculoskeletal: back pain.    Physical Exam Constitutional: Well nourished and well developed . No acute distress.  Pulmonary: No respiratory distress and normal respiratory rhythm and effort.  Cardiovascular: Heart rate and rhythm are normal . No peripheral edema.    Results/Data Urine [Data Includes: Last 1 Day]   26Jan2015  COLOR YELLOW   APPEARANCE CLEAR   SPECIFIC GRAVITY 1.025   pH 6.0   GLUCOSE NEG mg/dL  BILIRUBIN NEG   KETONE NEG mg/dL  BLOOD NEG   PROTEIN NEG mg/dL  UROBILINOGEN 0.2 mg/dL  NITRITE NEG   LEUKOCYTE ESTERASE TRACE   SQUAMOUS EPITHELIAL/HPF FEW   WBC 7-10 WBC/hpf  RBC 0-2 RBC/hpf  BACTERIA MODERATE   CRYSTALS NONE SEEN   CASTS Granular casts noted    The following images/tracing/specimen were independently visualized:  I have reviewed her CT films and report.  The following clinical   lab reports were reviewed:  UA reviewed. Selected Results  URINE CULTURE 23Dec2014 04:33PM Steele Berg  SOURCE : CLEAN CATCH SPECIMEN TYPE: URINE   Test Name Result Flag Reference  CULTURE, URINE Culture, Urine    ===== COLONY COUNT: =====  NO GROWTH   FINAL REPORT: NO GROWTH   AU CT-HEMATURIA PROTOCOL 23Dec2014 12:00AM Irine Seal   Test Name Result Flag Reference  AU CT-HEMATURIA PROTOCOL (Report)    ** RADIOLOGY REPORT BY [facility] **   CLINICAL DATA:  Microhematuria  EXAM: CT ABDOMEN AND PELVIS WITHOUT AND WITH CONTRAST  TECHNIQUE: Multidetector CT imaging of the abdomen and pelvis was performed without contrast material in one or both body regions, followed by contrast material(s) and further sections in one or both body regions.  CONTRAST: 125 cc of Isovue  COMPARISON: None  FINDINGS: The lung bases appear clear. No pleural or pericardial effusion identified. There is no focal liver abnormality identified. Previous cholecystectomy. The pancreas appears within normal limits. Normal appearance of the spleen.  The adrenal glands are both within normal limits. The left kidney appears normal. No left-sided nephrolithiasis or hydronephrosis identified. Cortical thinning and extensive scarring involves the right kidney. Stone within the right renal pelvis measures 1.8 cm, image 36/series 2. There is an adjacent stone which measures 5 mm. There is associated pelvocaliectasis involving the posterior renal calices, image 46/NGEXBM 3. There are several caliceal diverticula identified within the posterior right kidney. Focal area of dystrophic calcification is noted along the inferior right kidney, image 101/series 902. No significant hydroureter or ureteral lithiasis identified. The urinary bladder appears normal. The uterus and adnexal structures are unremarkable.  Postoperative changes involving the stomach are identified. This small bowel loops have a normal course and caliber and there is no obstruction. Normal appearance of the colon.  Review of the visualized bony structures is significant for mild multilevel lumbar degenerative disc disease.  IMPRESSION: 1. Right renal cortical scarring in volume loss. 2. Large stone is identified within the right renal pelvis causing distention of the posterior calices. 3. Normal appearance of the left kidney. 4. Prior cholecystectomy.   Electronically Signed  By: Kerby Moors  M.D.  On: 03/22/2013 16:25

## 2013-05-31 NOTE — Procedures (Signed)
R nephroureteral catheter placed No comp

## 2013-05-31 NOTE — Progress Notes (Signed)
Hgb. And Hct. Drawn by lab. 

## 2013-06-01 ENCOUNTER — Observation Stay (HOSPITAL_COMMUNITY): Payer: 59

## 2013-06-01 ENCOUNTER — Encounter (HOSPITAL_COMMUNITY): Payer: Self-pay | Admitting: Urology

## 2013-06-01 ENCOUNTER — Other Ambulatory Visit (HOSPITAL_COMMUNITY): Payer: 59

## 2013-06-01 DIAGNOSIS — N200 Calculus of kidney: Principal | ICD-10-CM

## 2013-06-01 DIAGNOSIS — I10 Essential (primary) hypertension: Secondary | ICD-10-CM

## 2013-06-01 DIAGNOSIS — I2699 Other pulmonary embolism without acute cor pulmonale: Secondary | ICD-10-CM

## 2013-06-01 DIAGNOSIS — E119 Type 2 diabetes mellitus without complications: Secondary | ICD-10-CM

## 2013-06-01 LAB — HEMOGLOBIN AND HEMATOCRIT, BLOOD
HEMATOCRIT: 37.7 % (ref 36.0–46.0)
Hemoglobin: 11.9 g/dL — ABNORMAL LOW (ref 12.0–15.0)

## 2013-06-01 LAB — HEPARIN LEVEL (UNFRACTIONATED): Heparin Unfractionated: 0.3 IU/mL (ref 0.30–0.70)

## 2013-06-01 MED ORDER — IPRATROPIUM-ALBUTEROL 0.5-2.5 (3) MG/3ML IN SOLN
3.0000 mL | RESPIRATORY_TRACT | Status: DC | PRN
  Administered 2013-06-01 – 2013-06-08 (×2): 3 mL via RESPIRATORY_TRACT
  Filled 2013-06-01: qty 3

## 2013-06-01 MED ORDER — METHYLPREDNISOLONE SODIUM SUCC 125 MG IJ SOLR
60.0000 mg | Freq: Two times a day (BID) | INTRAMUSCULAR | Status: DC
  Administered 2013-06-01 – 2013-06-03 (×5): 60 mg via INTRAVENOUS
  Filled 2013-06-01 (×6): qty 0.96

## 2013-06-01 MED ORDER — PATIENT'S GUIDE TO USING COUMADIN BOOK
Freq: Once | Status: DC
  Filled 2013-06-01: qty 1

## 2013-06-01 MED ORDER — WARFARIN - PHARMACIST DOSING INPATIENT: Freq: Every day | Status: DC

## 2013-06-01 MED ORDER — IPRATROPIUM-ALBUTEROL 0.5-2.5 (3) MG/3ML IN SOLN: 3.0000 mL | RESPIRATORY_TRACT | Status: DC | PRN

## 2013-06-01 MED ORDER — WARFARIN SODIUM 7.5 MG PO TABS
7.5000 mg | ORAL_TABLET | Freq: Once | ORAL | Status: AC
  Administered 2013-06-01: 7.5 mg via ORAL
  Filled 2013-06-01: qty 1

## 2013-06-01 MED ORDER — IPRATROPIUM-ALBUTEROL 0.5-2.5 (3) MG/3ML IN SOLN
3.0000 mL | Freq: Once | RESPIRATORY_TRACT | Status: AC
  Administered 2013-06-01: 3 mL via RESPIRATORY_TRACT
  Filled 2013-06-01: qty 3

## 2013-06-01 MED ORDER — IOHEXOL 350 MG/ML SOLN
100.0000 mL | Freq: Once | INTRAVENOUS | Status: AC | PRN
  Administered 2013-06-01: 100 mL via INTRAVENOUS

## 2013-06-01 MED ORDER — IPRATROPIUM-ALBUTEROL 0.5-2.5 (3) MG/3ML IN SOLN
RESPIRATORY_TRACT | Status: AC
  Filled 2013-06-01: qty 3

## 2013-06-01 MED ORDER — HEPARIN (PORCINE) IN NACL 100-0.45 UNIT/ML-% IJ SOLN
1600.0000 [IU]/h | INTRAMUSCULAR | Status: DC
  Administered 2013-06-01: 1600 [IU]/h via INTRAVENOUS
  Filled 2013-06-01 (×2): qty 250

## 2013-06-01 MED ORDER — WARFARIN VIDEO
Freq: Once | Status: AC
  Administered 2013-06-02: 10:00:00

## 2013-06-01 NOTE — Op Note (Signed)
NAME:  Burkman, Juliani                  ACCOUNT NO.:  [id number]  MEDICAL RECORD NO.:  [id number]  LOCATION:  XRAY                         FACILITY:  [facility]  PHYSICIAN:  Mikella Linsley J. Nautica Hotz, M.D.    DATE OF BIRTH:  11/04/1955  DATE OF PROCEDURE:  05/31/2013 DATE OF DISCHARGE:  05/31/2013                              OPERATIVE REPORT   PROCEDURE:  Right percutaneous nephrolithotomy for less than 2-cm stone.  PREOPERATIVE DIAGNOSIS:  Right renal stone.  POSTOPERATIVE DIAGNOSIS:  Right renal stone.  SURGEON:  Demarius Archila J. Aniah Pauli, MD  ANESTHESIA:  General.  SPECIMEN:  Stone fragments.  DRAINS:  A 24-French Ainsworth right nephrostomy tube, 6-French Kumpe right safety ureteral catheter, and 16-French Foley catheter.  SPECIMENS:  Stone fragments.  ESTIMATED BLOOD LOSS:  50 mL.  COMPLICATIONS:  None.  INDICATIONS:  [name] is a 58-year-old white female with a 17-mm right renal stone who was felt to be best treated with lithotripsy due to the density of the stone and the size.  FINDINGS OF PROCEDURE:  Last week, she had undergone placement of a right nephrostomy catheter into the collecting system, but catheter could not be placed past the stone.  So, decision was made to delay the percutaneous procedure that day and she returned today to IR where she received Cipro and a nephrostomy catheter was successfully placed by the stone down the ureter to the bladder.  DESCRIPTION OF PROCEDURE:  She was then taken to the operating room where general anesthetic was placed on the holding room stretcher and a Foley catheter was placed using sterile technique.  She was fitted with PAS hose.  She was rolled prone on chest rolls with great care being taken to pad all pressure points.  Once in position, her nephrostomy site was prepped with ChloraPrep and she was draped in usual sterile fashion.  A guidewire was then passed through the nephrostomy catheter to the bladder and the nephrostomy tube was then  removed.  A peel-away sheath was placed over the wire beyond the stone into the proximal ureter.  The dilator was removed and a second wire was then placed down the ureter to the bladder.  The peel-away sheath was removed and the skin incision was increased to approximately 2 cm in length.  The nephrostomy tract dilation balloon was then passed.  This was inflated to 18 atmospheres with disappearance of the waist on fluoroscopic evaluation.  The 30-French sheath was then advanced into the renal pelvis adjacent to the stone.  At this point, the rigid nephroscope was passed.  There was some bleeding and a clot had to be removed, but the stone was readily visualized.  It was then engaged with the Salvas stone blaster and broken into manageable free fragments which were removed with two prong graspers.  Once all significant fragments were removed, the flexible nephroscope was then passed and the renal pelvis was inspected.  Two additional fragments were identified and were removed with a Nitinol basket.  Final inspection of the pelvis and additional calices revealed no residual fragments.  No additional fragments were seen on fluoroscopy.  At this point, the guidewire was reinserted down the ureter   through the working sheath, and a 24-French Ainsworth Foley catheter was used as the nephrostomy catheter.  This was passed into the renal pelvis.  The nephrostomy sheath was backed out, and the balloon was filled with 3 mL of sterile fluid.  The nephrostogram was then performed which revealed no significant extravasation or tears in the collecting system.  There was a little leak back along with catheter.  At this point, the nephrostomy sheath was backed out alongside the nephrostomy tube and the nephrostomy tube was secured to the skin with a 2-0 silk suture.  A 6-French Kumpe safety catheter was then placed over the safety wire to the distal ureter and the wire was removed.  This was then  also secured to the skin with a silk suture.  The nephrostomy catheter was placed to straight drainage.  The Kumpe catheter was capped.  The drapes were removed and a dressing was applied.  The patient was then rolled back supine on the recovery room stretcher.  Her anesthetic was reversed and she was moved to the recovery room in stable condition.  There were no complications.     Marshall Cork. Jeffie Pollock, M.D.     JJW/MEDQ  D:  05/31/2013  T:  06/01/2013  Job:  [id number]

## 2013-06-01 NOTE — Addendum Note (Signed)
Encounter addended by: Loyal Gambler, RN on: 06/01/2013  4:52 PM<BR>     Documentation filed: Inpatient MAR

## 2013-06-01 NOTE — Consult Note (Signed)
[facility] Medical Consultation  Tricia Perry GEX:[id number] DOB: Jul 05, 1955 DOA: 05/31/2013 PCP: Tricia Sou, MD   Requesting physician: Dr. Jeffie [facility] (urology) Date of consultation: 06/01/2013 Reason for consultation: shortness of breath  Impression/Recommendations Principal Problem:   Renal stone  Brief narrative/HPI: 59 year old female with past medical history of asthma, hypertension, diabetes, kidney stone who presented to [facility] 05/31/13 for evaluation  2.3 cm stone found on CT scan. She subsequently underwent right percutaneous nephrolithotomy 05/31/2013. She was doing relatively ok post procedure but has developed sudden shortness of breath this morning for which reason [facility] consulted. Pt still has shortness of breath during the physical exam. She has no complaints of chest pain or palpitations. No lightheadedness or loss of consciousness.   Assessment and Plan:  Principal Problem: Acute respiratory distress - perhaps asthma exacerbation versus possible pulmonary embolism - i placed order for CT angio chest which did confirm acute right middle lobe PE - order placed for heparin adn coumadin; watch for bleeds - may continue duoneb every 2 hours PRN - also started solumedrol 60 mg Q 12 hours IV for possible asthma exacerbation - continue oxygen support via nasal canula to keep O2 saturation above 90%  Active Problems: Right renal stone - status post right perc nephrolithotomy - manage per GU Hypertension - continue lisinopril Diabetes - continue metformin   Tricia Perry [phone]  I will followup again tomorrow. Please contact me if I can be of assistance in the meanwhile. Thank you for this consultation.    Review of Systems:  Constitutional: Negative for fever, chills, diaphoresis, activity change, appetite change and fatigue.  HENT: Negative for ear pain, nosebleeds, congestion, facial swelling, rhinorrhea, neck pain, neck stiffness and ear discharge.   Eyes:  Negative for pain, discharge, redness, itching and visual disturbance.  Respiratory: Negative for cough, choking, chest tightness, positive for shortness of breath, no wheezing or stridor.   Cardiovascular: Negative for chest pain, palpitations and leg swelling.  Gastrointestinal: Negative for abdominal distention.  Genitourinary: Negative for dysuria, urgency, frequency, hematuria, flank pain, decreased urine volume, difficulty urinating and dyspareunia.  Musculoskeletal: Negative for back pain, joint swelling, arthralgias and gait problem.  Neurological: Negative for dizziness, tremors, seizures, syncope, facial asymmetry, speech difficulty, weakness, light-headedness, numbness and headaches.  Hematological: Negative for adenopathy. Does not bruise/bleed easily.  Psychiatric/Behavioral: Negative for hallucinations, behavioral problems, confusion, dysphoric mood, decreased concentration and agitation.     Past Medical History  Diagnosis Date  . Insulin resistance   . Hypertension   . Hypothyroidism   . 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 DDD (degenerative disc disease)     Dr. Eddie [facility] evaluated Tricia Perry and recommended pain mgmt MD.  She saw Dr. Selinda [facility] for pain mgmt at one time.  Marland Kitchen GERD (gastroesophageal reflux disease)   . Insomnia   . Morbid obesity     BMI 49.  Gastric stapling surgery in distant past.  . Peripheral neuropathy     No old records to confirm pt's report.  Pt refuses to have more NCS b/c test is painful.  . Carpal tunnel syndrome     bilateral  . Fibromyalgia     questionable  . Nephrolithiasis     Lithotripsy 2002.  1.8 cm stone in right renal pelvis 02/2013--will likely get this removed by Dr. Jeffie Perry  . Disequilibrium syndrome     MRI brain normal 2012  . DIZZINESS 04/12/2010    Qualifier: Diagnosis of  By:  Tricia [facility]   . Carpal tunnel syndrome on both sides 06/21/2010  . Mixed incontinence urge and stress   . Microscopic  hematuria 2014    CT abd pelv showed 1.8 cm right renal pelvis stone.  Also had UA c/w UTI so abx given.   . Shoulder pain     HX OF BILATERAL SHOULDER SURGERIES - PT HAS VERY LIMITED ROM - ESPECIALLY RAISING Tricia Perry ARMS  . Complication of anesthesia     STATES SHE WAS TOLD SHE WOKE UP DURING Tricia Perry KNEE REPLACEMENT SURGERY AND HAD TO BE GIVEN MORE MEDICINE - NOT SURE IF SPINAL OR GENERAL ANESTHESIA  . Chronic venous insufficiency     with edema  and extensive varicose dz: Tricia Perry is managing this, planning laser procedures.; A LOT OF LEG CRAMPS AND LEG STIFFNESS  . Asthma     NOT NEEDED INHALER IN OVER A YEAR  . Heart murmur     functional, w/u done; PALPITATIONS IN THE PAST  . Sleep apnea     PT DOES NOT HAVE MASK OR TUBING - JUST SLEEPS WITH HEAD ELEVATED ON 2 PILLOWS   . Diabetes mellitus without complication     ORAL MEDICATION  . Urinary frequency     AND NOCTURIA - UP AT NIGHT 5 OR 6 TIMES TO BATHROOM  . Renal stone 05/31/2013   Past Surgical History  Procedure Laterality Date  . Cholecystectomy  1987  . Joint replacement  2009    Right Knee, [facility]  . Foot surgery  2000    Left tarsel tunnel release  . Hernia repair   1980's    Diaphragmatic + gastric stapling  . Hernia repair      Inguinal  . Extracorporeal shock wave lithotripsy    . Endometrial ablation      menorrhagia  . Bilateral shoulder surgery    . Nephrolithotomy Right 05/31/2013    Procedure: NEPHROLITHOTOMY PERCUTANEOUS;  Surgeon: Tricia Seal, MD;  Location: [facility];  Service: Urology;  Laterality: Right;   Social History:  reports that she quit smoking about 38 years ago. Tricia [facility] smoking use included Cigarettes. She smoked 0.00 packs per day for 5 years. She has never used smokeless tobacco. She reports that she drinks alcohol. She reports that she does not use illicit drugs.  Allergies  Allergen Reactions  . Codeine Itching    Back hurts  . Mold Extract [Trichophyton] Nausea And Vomiting and Other  (See Comments)    Sneezing real bad   . Morphine Itching    back hurts   Family History  Problem Relation Age of Onset  . Hypertension Mother   . Cancer Mother     Brain/Lung/ smoker  . Diabetes Mother   . Stroke Father   . ADD / ADHD Daughter     ADHD  . Thyroid disease Brother   . Heart attack Maternal Grandfather   . Alcohol abuse Paternal Grandfather     Prior to Admission medications   Medication Sig Start Date End Date Taking? Authorizing Provider  acetaminophen (TYLENOL) 500 MG tablet Take 1,000 mg by mouth every 6 (six) hours as needed for mild pain or headache.    Yes Historical Provider, MD  estradiol (MINIVELLE) 0.025 MG/24HR Place 1 patch onto the skin once a week. Changes on saturdays   Yes Historical Provider, MD  levothyroxine (SYNTHROID, LEVOTHROID) 175 MCG tablet Take 175 mcg by mouth daily before breakfast.   Yes Historical Provider, MD  lisinopril (PRINIVIL,ZESTRIL) 40 MG tablet Take 40 mg by mouth every morning.   Yes Historical Provider, MD  metFORMIN (GLUCOPHAGE) 500 MG tablet Take 500 mg by mouth every evening.    Yes Historical Provider, MD  Multiple Vitamin (MULTIVITAMIN WITH MINERALS) TABS tablet Take 1 tablet by mouth daily.   Yes Historical Provider, MD  naproxen sodium (ANAPROX) 220 MG tablet Take 220 mg by mouth 3 (three) times daily with meals.    Yes Historical Provider, MD  oxyCODONE (ROXICODONE) 15 MG immediate release tablet Take 15 mg by mouth 4 (four) times daily. FOR BACK PAIN AND ARTHRITIS AND RT KNEE PAIN   Yes Historical Provider, MD  progesterone (PROMETRIUM) 200 MG capsule Take 200 mg by mouth at bedtime. For 14 nights every other month   Yes Historical Provider, MD  vitamin B-12 (CYANOCOBALAMIN) 100 MCG tablet Take 100 mcg by mouth daily.    Historical Provider, MD   Physical Exam: Blood pressure 141/61, pulse 90, temperature 98 F (36.7 C), temperature source Oral, resp. rate 22, height 5\' 4"  (1.626 m), weight 138.347 kg (305 lb), SpO2  100.00%. Filed Vitals:   06/01/13 1459  BP: 141/61  Pulse: 90  Temp: 98 F (36.7 C)  Resp: 22    Physical Exam  Constitutional: Appears well-developed and well-nourished. Mild distress due to shortness of breath HENT: Normocephalic. External right and left ear normal. Oropharynx is clear and moist.  Eyes: Conjunctivae and EOM are normal. PERRLA, no scleral icterus.  Neck: Normal ROM. Neck supple. No JVD. No tracheal deviation. No thyromegaly.  CVS: RRR, S1/S2 +, no murmurs, no gallops, no carotid bruit.  Pulmonary: diminished breath sounds bilaterally, no wheezing Abdominal: Soft. BS +,  no distension, tenderness, rebound or guarding.  Musculoskeletal: Normal range of motion. No edema and no tenderness.  Lymphadenopathy: No lymphadenopathy noted, cervical, inguinal. Neuro: Alert. Normal reflexes, muscle tone coordination. No cranial nerve deficit. Skin: Skin is warm and dry. No rash noted.  Psychiatric: Normal mood and affect. Behavior, judgment, thought content normal.     Labs on Admission:  Basic Metabolic Panel:  Recent Labs Lab 05/31/13 0800  NA 138  K 4.3  CL 100  CO2 26  GLUCOSE 117*  BUN 17  CREATININE 0.66  CALCIUM 9.6   Liver Function Tests: No results found for this basename: AST, ALT, ALKPHOS, BILITOT, PROT, ALBUMIN,  in the last 168 hours No results found for this basename: LIPASE, AMYLASE,  in the last 168 hours No results found for this basename: AMMONIA,  in the last 168 hours CBC:  Recent Labs Lab 05/31/13 0800 05/31/13 1356 06/01/13 0454  WBC 6.2  --   --   HGB 12.5 12.5 11.9*  HCT 37.8 37.9 37.7  MCV 90.4  --   --   PLT 294  --   --    Cardiac Enzymes: No results found for this basename: CKTOTAL, CKMB, CKMBINDEX, TROPONINI,  in the last 168 hours BNP: No components found with this basename: POCBNP,  CBG:  Recent Labs Lab 05/26/13 0816 05/31/13 0811 05/31/13 1351  GLUCAP 113* 113* 88    Radiological Exams on Admission: Abd 1  View (kub) 06/01/2013  IMPRESSION: No visualize retained stone fragments.   Electronically Signed   By: Aletta Edouard M.D.   On: 06/01/2013 08:04   Dg Abd 1 View 05/31/2013    IMPRESSION: Percutaneous removal of right renal stone. No evidence of a residual right renal stone or procedure complication.   Electronically Signed  By: Lajean Manes M.D.   On: 05/31/2013 14:22   Ct Angio Chest Pe W/cm &/or Wo Cm 06/01/2013    IMPRESSION: Pulmonary embolus within the right middle lobe pulmonary artery with segmental extension. The RV LV ratio is within normal limits.  Interstitial infiltrate likely reflecting component of pulmonary edema an infectious or inflammatory abnormality cannot be excluded. These results will be called to the ordering clinician or representative by the Radiologist Assistant, and communication documented in the PACS Dashboard.   Electronically Signed   By: Margaree Mackintosh M.D.   On: 06/01/2013 16:23   Ir Nephrostogram Right 05/31/2013     IMPRESSION: Successful nephro ureteral catheter placement as described. UPJ stricture is noted.   Electronically Signed   By: Maryclare Bean M.D.   On: 05/31/2013 10:16   Dg Chest Port 1 View 06/01/2013  IMPRESSION: Subsegmental bibasilar atelectasis in a low volume chest.   Electronically Signed   By: Inge Rise M.D.   On: 06/01/2013 09:50   Dg C-arm 1-60 Min-no Report  05/31/2013   CLINICAL DATA: kidney stone right   C-ARM 1-60 MINUTES  Fluoroscopy was utilized by the requesting physician.  No radiographic  interpretation.    Ir Oris Drone Cath Perc Right 05/31/2013    IMPRESSION: Successful nephro ureteral catheter placement as described. UPJ stricture is noted.   Electronically Signed   By: Maryclare Bean M.D.   On: 05/31/2013 10:16    Time spent: 45 minutes  Tricia Lenz [facility] Pager [phone]  If 7PM-7AM, please contact night-coverage [email or web address] Password [facility] 06/01/2013, 4:46 PM

## 2013-06-01 NOTE — Progress Notes (Signed)
Patient ID: Tricia Perry, female   DOB: 04-13-1955, 58 y.o.   MRN: [id number] 1 Day Post-Op  Subjective: Tricia Perry is having some SOB that she feels is probably her asthma.  Her sats are 100%.   KUB shows no residual fragments.   She is having pain and continues to require dilaudid.    The urine is light pink in the NT.  ROS: Negative except as above.  She has no calf pain or chest pain.   Objective: Vital signs in last 24 hours: Temp:  [97.3 F (36.3 C)-98.5 F (36.9 C)] 98 F (36.7 C) (03/04 0500) Pulse Rate:  [66-94] 85 (03/04 0500) Resp:  [9-24] 24 (03/04 0500) BP: (92-161)/(51-95) 118/59 mmHg (03/04 0500) SpO2:  [93 %-100 %] 94 % (03/04 0500) Weight:  [138.347 kg (305 lb)] 138.347 kg (305 lb) (03/03 1507)  Intake/Output from previous day: 03/03 0701 - 03/04 0700 In: 2831.7 [I.V.:2831.7] Out: 2700 [Urine:2700] Intake/Output this shift:    General appearance: alert, morbidly obese and she reports SOB but her breathing appears non-labored.  Resp: clear to auscultation bilaterally Cardio: regular rate and rhythm GI: Soft with + BS.   NT site saturated with bloody urine.  Extremities: extremities normal, atraumatic, no cyanosis or edema PAS hose in place.   Lab Results:   Recent Labs  05/31/13 0800 05/31/13 1356 06/01/13 0454  WBC 6.2  --   --   HGB 12.5 12.5 11.9*  HCT 37.8 37.9 37.7  PLT 294  --   --    BMET  Recent Labs  05/31/13 0800  NA 138  K 4.3  CL 100  CO2 26  GLUCOSE 117*  BUN 17  CREATININE 0.66  CALCIUM 9.6   PT/INR  Recent Labs  05/31/13 0800  LABPROT 11.7  INR 0.87   ABG No results found for this basename: PHART, PCO2, PO2, HCO3,  in the last 72 hours  Studies/Results: Abd 1 View (kub)  06/01/2013   CLINICAL DATA:  Status post right percutaneous nephrolithotomy for renal calculus.  EXAM: ABDOMEN - 1 VIEW  COMPARISON:  DG C-ARM 1-60 MIN-NO REPORT dated 05/31/2013; IR [facility] CATH PERC *R* dated 05/31/2013; IR NEPHROSTOGRAM*R* dated 05/26/2013   FINDINGS: Large bore nephrostomy tube and ureteral safety catheter are visualized on the right. There are no visible stone fragments overlying the Kidney, course of the ureter or bladder. Bowel gas pattern is unremarkable.  IMPRESSION: No visualize retained stone fragments.   Electronically Signed   By: Aletta Edouard M.D.   On: 06/01/2013 08:04   Dg Abd 1 View  05/31/2013   CLINICAL DATA:  Pre cutaneous removal of a right renal stone.  EXAM: ABDOMEN - 1 VIEW  COMPARISON:  CT, 03/22/2013  FINDINGS: Images show placement of a nephroureteral wire followed by a balloon with subsequent placement of a stone extraction device. The stone in the right kidney was then removed. Subsequent imaging with contrast enhancement of the right intrarenal collecting system shows no convincing residual stone  IMPRESSION: Percutaneous removal of right renal stone. No evidence of a residual right renal stone or procedure complication.   Electronically Signed   By: Lajean Manes M.D.   On: 05/31/2013 14:22   Ir Nephrostogram Right  05/31/2013   CLINICAL DATA:  Right nephrolithiasis  EXAM: IR RIGHT PERC INTRO URET CATH; RIGHT NEPHROSTOGRAM  MEDICATIONS AND MEDICAL HISTORY: Versed 10 mg, Fentanyl 6 under mcg.  As antibiotic prophylaxis, Cipro was ordered pre-procedure and administered intravenously within one hour  of incision.  ANESTHESIA/SEDATION: Moderate sedation time: 50 minutes  CONTRAST:  15 cc Omnipaque 300  FLUOROSCOPY TIME:  25 min and 18 seconds.  PROCEDURE: The procedure, risks, benefits, and alternatives were explained to the patient. Questions regarding the procedure were encouraged and answered. The patient understands and consents to the procedure.  The right that was prepped with Betadine in a sterile fashion, and a sterile drape was applied covering the operative field. A sterile gown and sterile gloves were used for the procedure.  The existing nephrostomy was cut and exchanged over a Bentson wire for a 5 [place] Kumpe  the catheter. Contrast was injected for a nephrostogram. An attempt was made to cross the pelvis into the ureter unsuccessfully. The Kumpe the was exchanged for a 6 French sheath then a cobra 2 catheter and finally a 5 [place] glide catheter. This was advanced over a roadrunner wire into the pelvis and ureter and finally the bladder. Contrast was injected. It was sewn in place.  COMPLICATIONS: None  FINDINGS: Imaging demonstrates a large pelvic calculus. 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 The final image demonstrates a 5 [place] glide catheter extending from a lower pole calyx to the bladder.  IMPRESSION: Successful nephro ureteral catheter placement as described. UPJ stricture is noted.   Electronically Signed   By: Maryclare Bean M.D.   On: 05/31/2013 10:16   Dg C-arm 1-60 Min-no Report  05/31/2013   CLINICAL DATA: kidney stone right   C-ARM 1-60 MINUTES  Fluoroscopy was utilized by the requesting physician.  No radiographic  interpretation.    Ir [facility] Cath Perc Right  05/31/2013   CLINICAL DATA:  Right nephrolithiasis  EXAM: IR RIGHT PERC INTRO URET CATH; RIGHT NEPHROSTOGRAM  MEDICATIONS AND MEDICAL HISTORY: Versed 10 mg, Fentanyl 6 under mcg.  As antibiotic prophylaxis, Cipro was ordered pre-procedure and administered intravenously within one hour of incision.  ANESTHESIA/SEDATION: Moderate sedation time: 50 minutes  CONTRAST:  15 cc Omnipaque 300  FLUOROSCOPY TIME:  25 min and 18 seconds.  PROCEDURE: The procedure, risks, benefits, and alternatives were explained to the patient. Questions regarding the procedure were encouraged and answered. The patient understands and consents to the procedure.  The right that was prepped with Betadine in a sterile fashion, and a sterile drape was applied covering the operative field. A sterile gown and sterile gloves were used for the procedure.  The existing nephrostomy was cut and exchanged over a Bentson wire for a 5 [place] Kumpe the catheter. Contrast  was injected for a nephrostogram. An attempt was made to cross the pelvis into the ureter unsuccessfully. The Kumpe the was exchanged for a 6 French sheath then a cobra 2 catheter and finally a 5 [place] glide catheter. This was advanced over a roadrunner wire into the pelvis and ureter and finally the bladder. Contrast was injected. It was sewn in place.  COMPLICATIONS: None  FINDINGS: Imaging demonstrates a large pelvic calculus. 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 The final image demonstrates a 5 [place] glide catheter extending from a lower pole calyx to the bladder.  IMPRESSION: Successful nephro ureteral catheter placement as described. UPJ stricture is noted.   Electronically Signed   By: Maryclare Bean M.D.   On: 05/31/2013 10:16    Anti-infectives: Anti-infectives   Start     Dose/Rate Route Frequency Ordered Stop   05/31/13 2000  ciprofloxacin (CIPRO) tablet 500 mg     500 mg Oral 2 times daily 05/31/13  1442     05/31/13 0730  ciprofloxacin (CIPRO) IVPB 400 mg  Status:  Discontinued     400 mg 200 mL/hr over 60 Minutes Intravenous On call 05/31/13 0724 05/31/13 0730   05/31/13 0724  ciprofloxacin (CIPRO) IVPB 400 mg  Status:  Discontinued     400 mg 200 mL/hr over 60 Minutes Intravenous 60 min pre-op 05/31/13 0724 05/31/13 1442      Current Facility-Administered Medications  Medication Dose Route Frequency Provider Last Rate Last Dose  . acetaminophen (TYLENOL) tablet 650 mg  650 mg Oral Q4H PRN Irine Seal, MD      . bisacodyl (DULCOLAX) suppository 10 mg  10 mg Rectal Daily PRN Irine Seal, MD      . ciprofloxacin (CIPRO) tablet 500 mg  500 mg Oral BID Irine Seal, MD   500 mg at 06/01/13 [id number]  . dextrose 5 % and 0.45 % NaCl with KCl 20 mEq/L infusion   Intravenous Continuous Irine Seal, MD 100 mL/hr at 06/01/13 0241    . diphenhydrAMINE (BENADRYL) injection 12.5-25 mg  12.5-25 mg Intravenous Q6H PRN Irine Seal, MD       Or  . diphenhydrAMINE (BENADRYL) 12.5 MG/5ML elixir  12.5-25 mg  12.5-25 mg Oral Q6H PRN Irine Seal, MD      . docusate sodium (COLACE) capsule 100 mg  100 mg Oral BID Irine Seal, MD   100 mg at 05/31/13 2250  . HYDROmorphone (DILAUDID) injection 1 mg  1 mg Intravenous Q2H PRN Irine Seal, MD   1 mg at 06/01/13 0703  . hyoscyamine (LEVSIN SL) SL tablet 0.125 mg  0.125 mg Oral Q4H PRN Irine Seal, MD   0.125 mg at 05/31/13 2015  . ipratropium-albuterol (DUONEB) 0.5-2.5 (3) MG/3ML nebulizer solution 3 mL  3 mL Nebulization Once Irine Seal, MD      . levothyroxine (SYNTHROID, LEVOTHROID) tablet 175 mcg  175 mcg Oral QAC breakfast Irine Seal, MD   175 mcg at 06/01/13 [phone]  . lisinopril (PRINIVIL,ZESTRIL) tablet 40 mg  40 mg Oral q morning - 10a Irine Seal, MD      . metFORMIN (GLUCOPHAGE) tablet 500 mg  500 mg Oral Q supper Irine Seal, MD   500 mg at 05/31/13 1750  . ondansetron (ZOFRAN) injection 4 mg  4 mg Intravenous Q4H PRN Irine Seal, MD      . oxyCODONE (Oxy IR/ROXICODONE) immediate release tablet 15 mg  15 mg Oral QID Irine Seal, MD   15 mg at 05/31/13 2250  . oxyCODONE (Oxy IR/ROXICODONE) immediate release tablet 5 mg  5 mg Oral Q4H PRN Irine Seal, MD   5 mg at 06/01/13 [id number]    Assessment: s/p Procedure(s): NEPHROLITHOTOMY PERCUTANEOUS  She is doing well and is stone free but has some SOB that maybe from pain or asthma.      Plan: Port CXR. Duoneb treatment. Heplok IV. Perc tube removed and dressing replaced.   I will consider discharge later today if SOB resolves.      LOS: 1 day    Jonavon Trieu J 06/01/2013

## 2013-06-01 NOTE — Progress Notes (Signed)
Patient ID: Tricia Perry, female   DOB: 1956-03-26, 58 y.o.   MRN: [id number]  Siah was found to have a PE on CT angio.   She is now on a heparin drip which I thought would be safer than lovenox until we can be sure she isn't going to bleed from her fresh perc.  She is doing better with reduced SOB and a sat of 100%.  If she has no bleeding for 24 hrs, she could probably be switched to Lovenox to aid the transition to discharge.

## 2013-06-01 NOTE — Progress Notes (Signed)
Patient complained of shortness of breath. Told RN that it was "probably related to my asthma". Breath sounds were clear anterior and posterior. Oxygen on 2 L nasal cannula was 2L. Pain medication also given this morning. Dr. Jeffie Pollock called. New orders were placed. Patient received breathing treatment and felt much better after treatment. Will continue to monitor patient. Setzer, Marchelle Folks

## 2013-06-01 NOTE — Addendum Note (Signed)
Encounter addended by: Loyal Gambler, RN on: 06/01/2013  5:18 PM<BR>     Documentation filed: Inpatient MAR

## 2013-06-01 NOTE — Addendum Note (Signed)
Encounter addended by: Zadie Rhine, RN on: 06/01/2013  9:10 PM<BR>     Documentation filed: Inpatient MAR

## 2013-06-01 NOTE — Progress Notes (Signed)
Pt given a one time neb for SOB. Pt on 2.5PLM [place] sats 99%, HR 88, RR 20. Pt complaining  of SOB. RN and MD aware. Pt states she has hx of asthma,but does not take any breathing medications at home.

## 2013-06-01 NOTE — Addendum Note (Signed)
Encounter addended by: Loyal Gambler, RN on: 06/01/2013  6:48 PM<BR>     Documentation filed: Inpatient MAR

## 2013-06-01 NOTE — Progress Notes (Addendum)
ANTICOAGULATION CONSULT NOTE - Initial Consult  Pharmacy Consult for:  IV Heparin, Warfarin Indication:  Acute pulmonary embolism  Allergies  Allergen Reactions  . Codeine Itching    Back hurts  . Mold Extract [Trichophyton] Nausea And Vomiting and Other (See Comments)    Sneezing real bad   . Morphine Itching    back hurts    Patient Measurements: Height: 5\' 4"  (162.6 cm) Weight: 305 lb (138.347 kg) IBW/kg (Calculated) : 54.7  Vital Signs: Temp: 98 F (36.7 C) (03/04 1459) Temp src: Oral (03/04 1459) BP: 141/61 mmHg (03/04 1459) Pulse Rate: 90 (03/04 1459)  Labs:  Recent Labs  05/31/13 0800 05/31/13 1356 06/01/13 0454  HGB 12.5 12.5 11.9*  HCT 37.8 37.9 37.7  PLT 294  --   --   APTT 29  --   --   LABPROT 11.7  --   --   INR 0.87  --   --   CREATININE 0.66  --   --     Estimated Creatinine Clearance: 106.6 ml/min (by C-G formula based on Cr of 0.66).   Medical History: Past Medical History  Diagnosis Date  . Insulin resistance   . Hypertension   . Hypothyroidism   . 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 DDD (degenerative disc disease)     Dr. [name] evaluated her and recommended pain mgmt MD.  She saw Dr. Selinda Orion for pain mgmt at one time.  Marland Kitchen GERD (gastroesophageal reflux disease)   . Insomnia   . Morbid obesity     BMI 49.  Gastric stapling surgery in distant past.  . Peripheral neuropathy     No old records to confirm pt's report.  Pt refuses to have more NCS b/c test is painful.  . Carpal tunnel syndrome     bilateral  . Fibromyalgia     questionable  . Nephrolithiasis     Lithotripsy 2002.  1.8 cm stone in right renal pelvis 02/2013--will likely get this removed by Dr. Jeffie Pollock  . Disequilibrium syndrome     MRI brain normal 2012  . DIZZINESS 04/12/2010    Qualifier: Diagnosis of  By: Anitra Lauth M.D., Brien Few   . Carpal tunnel syndrome on both sides 06/21/2010  . Mixed incontinence urge and stress   . Microscopic  hematuria 2014    CT abd pelv showed 1.8 cm right renal pelvis stone.  Also had UA c/w UTI so abx given.   . Shoulder pain     HX OF BILATERAL SHOULDER SURGERIES - PT HAS VERY LIMITED ROM - ESPECIALLY RAISING HER ARMS  . Complication of anesthesia     STATES SHE WAS TOLD SHE WOKE UP DURING HER KNEE REPLACEMENT SURGERY AND HAD TO BE GIVEN MORE MEDICINE - NOT SURE IF SPINAL OR GENERAL ANESTHESIA  . Chronic venous insufficiency     with edema  and extensive varicose dz: Dr. Linus Mako is managing this, planning laser procedures.; A LOT OF LEG CRAMPS AND LEG STIFFNESS  . Asthma     NOT NEEDED INHALER IN OVER A YEAR  . Heart murmur     functional, w/u done; PALPITATIONS IN THE PAST  . Sleep apnea     PT DOES NOT HAVE MASK OR TUBING - JUST SLEEPS WITH HEAD ELEVATED ON 2 PILLOWS   . Diabetes mellitus without complication     ORAL MEDICATION  . Urinary frequency     AND NOCTURIA - UP AT NIGHT 5  OR 6 TIMES TO BATHROOM  . Renal stone 05/31/2013    Medications:  Scheduled:  . ciprofloxacin  500 mg Oral BID  . docusate sodium  100 mg Oral BID  . ipratropium-albuterol      . levothyroxine  175 mcg Oral QAC breakfast  . lisinopril  40 mg Oral q morning - 10a  . metFORMIN  500 mg Oral Q supper  . methylPREDNISolone (SOLU-MEDROL) injection  60 mg Intravenous Q12H  . oxyCODONE  15 mg Oral QID    Assessment:  Asked to assist with IV Heparin and Coumadin for this 58 year-old female with an acute PE.  Right percutaneous nephrolithotomy was performed on 3/3 due to a small stone.  The patient's nurse reports that there is no blood in the urine, and there is no bleeding at the dressing site.  Please note that Cipro may interact with Coumadin to increase the anticoagulant effect.  Recommend frequent monitoring of the PT/INR in the outpatient setting during Cipro therapy and in the immediate period after the drug is discontinued.  Goal of Therapy:  INR 2-3 Heparin level 0.3-0.7  units/ml Monitor platelets by anticoagulation protocol: Yes   Plan:   Begin IV Heparin infusion at 1600 units/hr, with no loading dose due to recent surgery.  Heparin level 6 hours after starting the infusion  Coumadin 7.5 mg as the initial dose.  Daily Heparin level, CBC, PT/INR  Monitor for signs of bleeding  Patient education about Coumadin  Allena Napoleon R.Ph. 06/01/2013 5:45 PM

## 2013-06-01 NOTE — H&P (Signed)
Nutrition Brief Note  Patient identified on the Malnutrition Screening Tool (MST) Report  Wt Readings from Last 15 Encounters:  05/31/13 305 lb (138.347 kg)  05/31/13 305 lb (138.347 kg)  05/31/13 305 lb (138.347 kg)  05/26/13 305 lb (138.347 kg)  05/23/13 305 lb (138.347 kg)  04/20/13 300 lb (136.079 kg)  10/28/12 293 lb (132.904 kg)  05/31/12 295 lb (133.811 kg)  11/10/11 292 lb 6.4 oz (132.632 kg)  11/01/11 260 lb (117.935 kg)  04/10/11 285 lb (129.275 kg)  12/30/10 273 lb (123.832 kg)  12/09/10 277 lb (125.646 kg)  07/11/10 282 lb (127.914 kg)  06/21/10 284 lb 12.8 oz (129.184 kg)    Body mass index is 52.33 kg/(m^2). Patient meets criteria for Obesity III/Morbid obesity based on current BMI.   Current diet order is Carb Modified patient is consuming approximately 50% of meals at this time. Labs and medications reviewed.   Pt denied any changes in appetite or weight pta. Has had pain post stent removal and some SOB that decreased PO intake of breakfast to 50%. Assisted in ordering lunch. No nutrition related questions concerning DM or weight management diet recommendations.  No nutrition interventions warranted at this time. If nutrition issues arise, please consult RD.   Tricia Abide MS RD LDN Clinical Dietitian [phone]

## 2013-06-01 NOTE — Op Note (Deleted)
NAMEALIZAE, BECHTEL                  ACCOUNT NO.:  [id number]  MEDICAL RECORD NO.:  [id number]  LOCATION:  XRAY                         FACILITY:  [facility]  PHYSICIAN:  Marshall Cork. Tricia Perry, M.D.    DATE OF BIRTH:  November 09, 1955  DATE OF PROCEDURE:  05/31/2013 DATE OF DISCHARGE:  05/31/2013                              OPERATIVE REPORT   PROCEDURE:  Right percutaneous nephrolithotomy for less than 2-cm stone.  PREOPERATIVE DIAGNOSIS:  Right renal stone.  POSTOPERATIVE DIAGNOSIS:  Right renal stone.  SURGEON:  Marshall Cork. Tricia Pollock, MD  ANESTHESIA:  General.  SPECIMEN:  Stone fragments.  DRAINS:  A 24-French Ainsworth right nephrostomy tube, 6-French Kumpe right safety ureteral catheter, and 16-French Foley catheter.  SPECIMENS:  Stone fragments.  ESTIMATED BLOOD LOSS:  50 mL.  COMPLICATIONS:  None.  INDICATIONS:  [name] is a 58 year old white female with a 17-mm right renal stone who was felt to be best treated with lithotripsy due to the density of the stone and the size.  FINDINGS OF PROCEDURE:  Last week, she had undergone placement of a right nephrostomy catheter into the collecting system, but catheter could not be placed past the stone.  So, decision was made to delay the percutaneous procedure that day and she returned today to IR where she received Cipro and a nephrostomy catheter was successfully placed by the stone down the ureter to the bladder.  DESCRIPTION OF PROCEDURE:  She was then taken to the operating room where general anesthetic was placed on the holding room stretcher and a Foley catheter was placed using sterile technique.  She was fitted with PAS hose.  She was rolled prone on chest rolls with great care being taken to pad all pressure points.  Once in position, her nephrostomy site was prepped with ChloraPrep and she was draped in usual sterile fashion.  A guidewire was then passed through the nephrostomy catheter to the bladder and the nephrostomy tube was then  removed.  A peel-away sheath was placed over the wire beyond the stone into the proximal ureter.  The dilator was removed and a second wire was then placed down the ureter to the bladder.  The peel-away sheath was removed and the skin incision was increased to approximately 2 cm in length.  The nephrostomy tract dilation balloon was then passed.  This was inflated to 18 atmospheres with disappearance of the waist on fluoroscopic evaluation.  The 30-French sheath was then advanced into the renal pelvis adjacent to the stone.  At this point, the rigid nephroscope was passed.  There was some bleeding and a clot had to be removed, but the stone was readily visualized.  It was then engaged with the [facility] stone blaster and broken into manageable free fragments which were removed with two prong graspers.  Once all significant fragments were removed, the flexible nephroscope was then passed and the renal pelvis was inspected.  Two additional fragments were identified and were removed with a Nitinol basket.  Final inspection of the pelvis and additional calices revealed no residual fragments.  No additional fragments were seen on fluoroscopy.  At this point, the guidewire was reinserted down the ureter  through the working sheath, and a 24-French Ainsworth Foley catheter was used as the nephrostomy catheter.  This was passed into the renal pelvis.  The nephrostomy sheath was backed out, and the balloon was filled with 3 mL of sterile fluid.  The nephrostogram was then performed which revealed no significant extravasation or tears in the collecting system.  There was a little leak back along with catheter.  At this point, the nephrostomy sheath was backed out alongside the nephrostomy tube and the nephrostomy tube was secured to the skin with a 2-0 silk suture.  A 6-French Kumpe safety catheter was then placed over the safety wire to the distal ureter and the wire was removed.  This was then  also secured to the skin with a silk suture.  The nephrostomy catheter was placed to straight drainage.  The Kumpe catheter was capped.  The drapes were removed and a dressing was applied.  The patient was then rolled back supine on the recovery room stretcher.  Her anesthetic was reversed and she was moved to the recovery room in stable condition.  There were no complications.     Marshall Cork. Tricia Perry, M.D.     JJW/MEDQ  D:  05/31/2013  T:  06/01/2013  Job:  [id number]

## 2013-06-01 NOTE — Anesthesia Postprocedure Evaluation (Signed)
Anesthesia Post Note  Patient: Tricia Perry  Procedure(s) Performed: Procedure(s) (LRB): NEPHROLITHOTOMY PERCUTANEOUS (Right)  Anesthesia type: General  Patient location: PACU  Post pain: Pain level controlled  Post assessment: Post-op Vital signs reviewed  Last Vitals:  Filed Vitals:   06/01/13 0500  BP: 118/59  Pulse: 85  Temp: 36.7 C  Resp: 24    Post vital signs: Reviewed  Level of consciousness: sedated  Complications: No apparent anesthesia complications

## 2013-06-02 DIAGNOSIS — G8929 Other chronic pain: Secondary | ICD-10-CM

## 2013-06-02 DIAGNOSIS — I872 Venous insufficiency (chronic) (peripheral): Secondary | ICD-10-CM

## 2013-06-02 DIAGNOSIS — I2699 Other pulmonary embolism without acute cor pulmonale: Secondary | ICD-10-CM | POA: Diagnosis present

## 2013-06-02 LAB — CBC
HCT: 38.4 % (ref 36.0–46.0)
HEMOGLOBIN: 12.5 g/dL (ref 12.0–15.0)
MCH: 29.7 pg (ref 26.0–34.0)
MCHC: 32.6 g/dL (ref 30.0–36.0)
MCV: 91.2 fL (ref 78.0–100.0)
PLATELETS: 258 10*3/uL (ref 150–400)
RBC: 4.21 MIL/uL (ref 3.87–5.11)
RDW: 14.2 % (ref 11.5–15.5)
WBC: 14.8 10*3/uL — ABNORMAL HIGH (ref 4.0–10.5)

## 2013-06-02 LAB — HEPARIN LEVEL (UNFRACTIONATED)
HEPARIN UNFRACTIONATED: 0.53 [IU]/mL (ref 0.30–0.70)
Heparin Unfractionated: 0.29 IU/mL — ABNORMAL LOW (ref 0.30–0.70)

## 2013-06-02 LAB — PROTIME-INR
INR: 1 (ref 0.00–1.49)
Prothrombin Time: 13 seconds (ref 11.6–15.2)

## 2013-06-02 MED ORDER — HEPARIN (PORCINE) IN NACL 100-0.45 UNIT/ML-% IJ SOLN
1750.0000 [IU]/h | INTRAMUSCULAR | Status: AC
  Administered 2013-06-02: 1750 [IU]/h via INTRAVENOUS
  Filled 2013-06-02 (×2): qty 250

## 2013-06-02 MED ORDER — ENOXAPARIN SODIUM 150 MG/ML ~~LOC~~ SOLN
140.0000 mg | Freq: Two times a day (BID) | SUBCUTANEOUS | Status: DC
  Administered 2013-06-02: 140 mg via SUBCUTANEOUS
  Filled 2013-06-02 (×4): qty 1

## 2013-06-02 NOTE — Progress Notes (Addendum)
ANTICOAGULATION CONSULT NOTE - Follow Up Consult  Pharmacy Consult for Heparin, Warfarin Indication: pulmonary embolus  Allergies  Allergen Reactions  . Codeine Itching    Back hurts  . Mold Extract [Trichophyton] Nausea And Vomiting and Other (See Comments)    Sneezing real bad   . Morphine Itching    back hurts    Patient Measurements: Height: 5\' 4"  (162.6 cm) Weight: 305 lb (138.347 kg) IBW/kg (Calculated) : 54.7 Heparin Dosing Weight: 90 kg  Vital Signs: Temp: 98.6 F (37 C) (03/05 0550) Temp src: Oral (03/05 0550) BP: 139/70 mmHg (03/05 0550) Pulse Rate: 88 (03/05 0550)  Labs:  Recent Labs  05/31/13 0800 05/31/13 1356 06/01/13 0454 06/01/13 2333 06/02/13 0435  HGB 12.5 12.5 11.9*  --   --   HCT 37.8 37.9 37.7  --   --   PLT 294  --   --   --   --   APTT 29  --   --   --   --   LABPROT 11.7  --   --   --  13.0  INR 0.87  --   --   --  1.00  HEPARINUNFRC  --   --   --  0.30 0.29*  CREATININE 0.66  --   --   --   --     Estimated Creatinine Clearance: 106.6 ml/min (by C-G formula based on Cr of 0.66).   Medications:  Scheduled:  . ciprofloxacin  500 mg Oral BID  . docusate sodium  100 mg Oral BID  . levothyroxine  175 mcg Oral QAC breakfast  . lisinopril  40 mg Oral q morning - 10a  . metFORMIN  500 mg Oral Q supper  . methylPREDNISolone (SOLU-MEDROL) injection  60 mg Intravenous Q12H  . oxyCODONE  15 mg Oral QID  . patient's guide to using coumadin book   Does not apply Once  . warfarin   Does not apply Once  . Warfarin - Pharmacist Dosing Inpatient   Does not apply q1800   Infusions:  . heparin Stopped (06/02/13 0559)  . heparin 1,750 Units/hr (06/02/13 0559)    Assessment: 44 yoF s/p right perc nephrolithotomy on 3/3 and developed SOB on 3/4.  CT angio confirm acute right middle lobe PE.  Pharmacy is consulted to dose IV heparin bridge to warfarin.  INR 1, remains subtherapeutic as expected after only one warfarin dose.  Noted drug-drug  interaction with cipro (will increase INR)  CBC: Hgb 12.5, Plt 258  Heparin level 0.53 is therapeutic.   Goal of Therapy:  INR 2-3 Heparin level 0.3-0.7 units/ml Monitor platelets by anticoagulation protocol: Yes   Plan:   Continue heparin IV infusion at 1750 units/hr (17.5 ml/hr)  Warfarin 5 mg PO today at 1800  Heparin level in 6 hours to confirm therapeutic level.  Daily INR, heparin level, and CBC  Continue to monitor H&H and platelets  Warfarin education book and video provided to the patient  Pharmacist warfarin counseling prior to discharge.  Gretta Arab PharmD, BCPS Pager [phone] 06/02/2013 12:52 PM    Addendum:  Urology and TR[facility] in agreement with change from IV Heparin to SQ Lovenox.  Pharmacy is consulted to dose Lovenox.  CrCl > 100 ml/min  Last heparin level was therapeutic, d/c heparin drip and in one hour, give Lovenox dose.  Note Cipro was discontinued (decreased drug-drug interaction with warfarin)  Warfarin education completed 06/02/13 Plan:  At 1700, discontinue heparin drip.  At 1800, start  Lovenox 140mg  SQ q12h injections.  Continue with warfarin dosing as ordered.  Gretta Arab PharmD, BCPS Pager [phone] 06/02/2013 2:38 PM

## 2013-06-02 NOTE — Progress Notes (Signed)
Patient ID: Tricia Perry, female   DOB: 19-May-1955, 58 y.o.   MRN: [id number]  [facility] PROGRESS NOTE  Tricia Perry JJH:[id number] DOB: 03-Feb-1956 DOA: 05/31/2013 PCP: Tammi Sou, MD  Brief narrative: 58 year old female with past medical history of asthma, hypertension, diabetes, kidney stone who presented to [facility]/31/13 for evaluation 2.3 cm stone found on CT scan. She subsequently underwent right percutaneous nephrolithotomy 05/31/2013. She was doing relatively ok post procedure but has developed sudden shortness of breath this morning for which reason [facility] consulted.  Assessment and Plan:  Principal Problem:  Acute respiratory distress  - secondary to to PE as confirmed by CT angio chest  - plan on transitioning to Lovenox  - may continue duoneb every 2 hours PRN  - taper down steroids, d/c solumedrol and place on Prednisone starting tomorrow 3/6 with taper over the next 5 days    - continue oxygen support via nasal canula to keep O2 saturation above 90%  - encouraged ambulation as pt able to tolerate  Active Problems:  Right renal stone  - status post right perc nephrolithotomy  - manage per GU  Hypertension  - continue lisinopril  Diabetes  - continue metformin  Leukocytosis - secondary to Solumedrol - transition to Prednisone in AM, check CBC in AM  Procedures/Studies: Abd 1 View 06/01/2013  No visualize retained stone fragments.   Ct Angio Chest Pe W/cm &/or Wo Cm   06/01/2013  Pulmonary embolus within the right middle lobe pulmonary artery with segmental extension. The RV LV ratio is within normal limits.  Interstitial infiltrate likely reflecting component of pulmonary edema an infectious or inflammatory abnormality cannot be excluded.  Dg Chest Port 1 View   06/01/2013  Subsegmental bibasilar atelectasis in a low volume chest.    Antibiotics:  None  Code Status: Full Family Communication: Pt at bedside Disposition Plan: Home when medically  stable  HPI/Subjective: No events overnight.   Objective: Filed Vitals:   06/01/13 2159 06/02/13 0550 06/02/13 0910 06/02/13 1500  BP: 145/66 139/70 145/62 136/72  Pulse: 90 88  80  Temp: 98.1 F (36.7 C) 98.6 F (37 C)  97.8 F (36.6 C)  TempSrc: Oral Oral  Oral  Resp: 22 24  22   Height:      Weight:      SpO2: 99% 100%  97%    Intake/Output Summary (Last 24 hours) at 06/02/13 1734 Last data filed at 06/02/13 1500  Gross per 24 hour  Intake    480 ml  Output   1950 ml  Net  -1470 ml    Exam:   General:  Pt is alert, follows commands appropriately, not in acute distress  Cardiovascular: Regular rate and rhythm, S1/S2, no murmurs, no rubs, no gallops  Respiratory: Clear to auscultation bilaterally, no wheezing, diminished breath sounds at bases   Abdomen: Soft, non tender, non distended, bowel sounds present, no guarding  Extremities: No edema, pulses DP and PT palpable bilaterally  Neuro: Grossly nonfocal  Data Reviewed: Basic Metabolic Panel:  Recent Labs Lab 05/31/13 0800  NA 138  K 4.3  CL 100  CO2 26  GLUCOSE 117*  BUN 17  CREATININE 0.66  CALCIUM 9.6   CBC:  Recent Labs Lab 05/31/13 0800 05/31/13 1356 06/01/13 0454 06/02/13 1135  WBC 6.2  --   --  14.8*  HGB 12.5 12.5 11.9* 12.5  HCT 37.8 37.9 37.7 38.4  MCV 90.4  --   --  91.2  PLT 294  --   --  258   CBG:  Recent Labs Lab 05/31/13 0811 05/31/13 1351  GLUCAP 113* 88   Scheduled Meds: . docusate sodium  100 mg Oral BID  . enoxaparin (LOVENOX) injection  140 mg Subcutaneous Q12H  . levothyroxine  175 mcg Oral QAC breakfast  . lisinopril  40 mg Oral q morning - 10a  . metFORMIN  500 mg Oral Q supper  . methylPREDNISolone (SOLU-MEDROL) injection  60 mg Intravenous Q12H  . oxyCODONE  15 mg Oral QID  . patient's guide to using coumadin book   Does not apply Once  . Warfarin - Pharmacist Dosing Inpatient   Does not apply q1800   Continuous Infusions:  Faye Ramsay,  MD  [facility], [place] Pager [phone]  If 7PM-7AM, please contact night-coverage [email or web address] Password [email or web address] 06/02/2013, 5:34 PM   LOS: 2 days

## 2013-06-02 NOTE — Progress Notes (Signed)
UR completed. Patient changed to inpatient- requiring IV heparin gtt for acute PE

## 2013-06-02 NOTE — Progress Notes (Signed)
ANTICOAGULATION CONSULT NOTE - Follow Up Consult  Pharmacy Consult for Heparin Indication: pulmonary embolus  Allergies  Allergen Reactions  . Codeine Itching    Back hurts  . Mold Extract [Trichophyton] Nausea And Vomiting and Other (See Comments)    Sneezing real bad   . Morphine Itching    back hurts    Patient Measurements: Height: 5\' 4"  (162.6 cm) Weight: 305 lb (138.347 kg) IBW/kg (Calculated) : 54.7 Heparin Dosing Weight:   Vital Signs: Temp: 98.1 F (36.7 C) (03/04 2159) Temp src: Oral (03/04 2159) BP: 145/66 mmHg (03/04 2159) Pulse Rate: 90 (03/04 2159)  Labs:  Recent Labs  05/31/13 0800 05/31/13 1356 06/01/13 0454 06/01/13 2333 06/02/13 0435  HGB 12.5 12.5 11.9*  --   --   HCT 37.8 37.9 37.7  --   --   PLT 294  --   --   --   --   APTT 29  --   --   --   --   LABPROT 11.7  --   --   --  13.0  INR 0.87  --   --   --  1.00  HEPARINUNFRC  --   --   --  0.30 0.29*  CREATININE 0.66  --   --   --   --     Estimated Creatinine Clearance: 106.6 ml/min (by C-G formula based on Cr of 0.66).   Medications:  Infusions:  . heparin 1,600 Units/hr (06/01/13 1712)  . heparin      Assessment: Patient with low heparin level.  Just below goal.  No issues noted per RN.  Goal of Therapy:  Heparin level 0.3-0.7 units/ml Monitor platelets by anticoagulation protocol: Yes   Plan:  Increase heparin drip to 1750 units/hr Recheck level at [place], Shea Stakes [place] 06/02/2013,5:37 AM

## 2013-06-02 NOTE — Progress Notes (Signed)
Patient ID: Tricia Perry, female   DOB: Aug 07, 1955, 58 y.o.   MRN: [id number] 2 Days Post-Op  Subjective: 1) S/P right PCNL.   She has some flank pain but it is well controlled.   She is voiding well and the urine is clear.   2) Right pulmonary embolus.  She continues to have SOB exacerbated by activity, but her sats were 94-100% on a nasal canula and 91% on RA.   She has no CP or tachycardia.  ROS: Negative except as above.   She has no nausea.   Objective: Vital signs in last 24 hours: Temp:  [98 F (36.7 C)-98.6 F (37 C)] 98.6 F (37 C) (03/05 0550) Pulse Rate:  [88-90] 88 (03/05 0550) Resp:  [22-24] 24 (03/05 0550) BP: (139-145)/(61-70) 145/62 mmHg (03/05 0910) SpO2:  [99 %-100 %] 100 % (03/05 0550)  Intake/Output from previous day: 03/04 0701 - 03/05 0700 In: 480 [P.O.:480] Out: 1450 [Urine:1450] Intake/Output this shift: Total I/O In: -  Out: 250 [Urine:250]  General appearance: alert, no distress and morbidly obese Resp: clear to auscultation bilaterally Cardio: regular rate and rhythm GI: Soft, Obese with some right flank tenderness.   Lab Results:   Recent Labs  05/31/13 0800 05/31/13 1356 06/01/13 0454  WBC 6.2  --   --   HGB 12.5 12.5 11.9*  HCT 37.8 37.9 37.7  PLT 294  --   --    BMET  Recent Labs  05/31/13 0800  NA 138  K 4.3  CL 100  CO2 26  GLUCOSE 117*  BUN 17  CREATININE 0.66  CALCIUM 9.6   PT/INR  Recent Labs  05/31/13 0800 06/02/13 0435  LABPROT 11.7 13.0  INR 0.87 1.00   ABG No results found for this basename: PHART, PCO2, PO2, HCO3,  in the last 72 hours  Studies/Results: Abd 1 View (kub)  06/01/2013   CLINICAL DATA:  Status post right percutaneous nephrolithotomy for renal calculus.  EXAM: ABDOMEN - 1 VIEW  COMPARISON:  DG C-ARM 1-60 MIN-NO REPORT dated 05/31/2013; IR [place] [facility] CATH PERC *R* dated 05/31/2013; IR NEPHROSTOGRAM*R* dated 05/26/2013  FINDINGS: Large bore nephrostomy tube and ureteral safety catheter are visualized  on the right. There are no visible stone fragments overlying the Kidney, course of the ureter or bladder. Bowel gas pattern is unremarkable.  IMPRESSION: No visualize retained stone fragments.   Electronically Signed   By: Irish Lack M.D.   On: 06/01/2013 08:04   Dg Abd 1 View  05/31/2013   CLINICAL DATA:  Pre cutaneous removal of a right renal stone.  EXAM: ABDOMEN - 1 VIEW  COMPARISON:  CT, 03/22/2013  FINDINGS: Images show placement of a nephroureteral wire followed by a balloon with subsequent placement of a stone extraction device. The stone in the right kidney was then removed. Subsequent imaging with contrast enhancement of the right intrarenal collecting system shows no convincing residual stone  IMPRESSION: Percutaneous removal of right renal stone. No evidence of a residual right renal stone or procedure complication.   Electronically Signed   By: Amie Portland M.D.   On: 05/31/2013 14:22   Ct Angio Chest Pe W/cm &/or Wo Cm  06/01/2013   CLINICAL DATA:  Evaluate for pulmonary arterial embolic disease.  EXAM: CT ANGIOGRAPHY CHEST WITH CONTRAST  TECHNIQUE: Multidetector CT imaging of the chest was performed using the standard protocol during bolus administration of intravenous contrast. Multiplanar CT image reconstructions and MIPs were obtained to evaluate the vascular anatomy.  CONTRAST:  132mL OMNIPAQUE IOHEXOL 350 MG/ML SOLN  COMPARISON:  None.  FINDINGS: The thoracic inlet is unremarkable.  Evaluation of mediastinal hilar region structures demonstrate no evidence of adenopathy nor masses. The RV/ LV ratio calculated at 0.77 which is normal. Multichamber cardiac enlargement is appreciated.  A filling defect is appreciated within the right middle lobe pulmonary artery with extension into a segmental branch. Findings appreciated on image 29 series 4.  Evaluation of the lung parenchyma is degraded by respiratory artifact. There is diffuse ground-glass density throughout both lungs as well as  thickening of the interstitial markings. Ill-defined areas of increased density project within the lung bases with linear and consolidative components.  A trace right pleural effusion is appreciated.  Visualized upper abdominal viscera demonstrate postsurgical changes within the stomach, otherwise grossly unremarkable.  Review of the MIP images confirms the above findings.  IMPRESSION: Pulmonary embolus within the right middle lobe pulmonary artery with segmental extension. The RV LV ratio is within normal limits.  Interstitial infiltrate likely reflecting component of pulmonary edema an infectious or inflammatory abnormality cannot be excluded. These results will be called to the ordering clinician or representative by the Radiologist Assistant, and communication documented in the PACS Dashboard.   Electronically Signed   By: Margaree Mackintosh M.D.   On: 06/01/2013 16:23   Ir Nephrostogram Right  05/31/2013   CLINICAL DATA:  Right nephrolithiasis  EXAM: IR RIGHT PERC INTRO URET CATH; RIGHT NEPHROSTOGRAM  MEDICATIONS AND MEDICAL HISTORY: Versed 10 mg, Fentanyl 6 under mcg.  As antibiotic prophylaxis, Cipro was ordered pre-procedure and administered intravenously within one hour of incision.  ANESTHESIA/SEDATION: Moderate sedation time: 50 minutes  CONTRAST:  15 cc Omnipaque 300  FLUOROSCOPY TIME:  25 min and 18 seconds.  PROCEDURE: The procedure, risks, benefits, and alternatives were explained to the patient. Questions regarding the procedure were encouraged and answered. The patient understands and consents to the procedure.  The right that was prepped with Betadine in a sterile fashion, and a sterile drape was applied covering the operative field. A sterile gown and sterile gloves were used for the procedure.  The existing nephrostomy was cut and exchanged over a Bentson wire for a 5 [place] Kumpe the catheter. Contrast was injected for a nephrostogram. An attempt was made to cross the pelvis into the ureter  unsuccessfully. The Kumpe the was exchanged for a 6 French sheath then a cobra 2 catheter and finally a 5 [place] glide catheter. This was advanced over a roadrunner wire into the pelvis and ureter and finally the bladder. Contrast was injected. It was sewn in place.  COMPLICATIONS: None  FINDINGS: Imaging demonstrates a large pelvic calculus. 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 The final image demonstrates a 5 [place] glide catheter extending from a lower pole calyx to the bladder.  IMPRESSION: Successful nephro ureteral catheter placement as described. UPJ stricture is noted.   Electronically Signed   By: Maryclare Bean M.D.   On: 05/31/2013 10:16   Dg Chest Port 1 View  06/01/2013   CLINICAL DATA:  Shortness of breath.  EXAM: PORTABLE CHEST - 1 VIEW  COMPARISON:  PA and lateral chest and CT chest 04/20/2013.  FINDINGS: Subsegmental atelectasis is seen in the lung bases. Lung volumes are low. No consolidative process, pneumothorax or effusion. Heart size is normal.  IMPRESSION: Subsegmental bibasilar atelectasis in a low volume chest.   Electronically Signed   By: Inge Rise M.D.   On: 06/01/2013 09:50  Dg C-arm 1-60 Min-no Report  05/31/2013   CLINICAL DATA: kidney stone right   C-ARM 1-60 MINUTES  Fluoroscopy was utilized by the requesting physician.  No radiographic  interpretation.    Ir [place] Cath Perc Right  05/31/2013   CLINICAL DATA:  Right nephrolithiasis  EXAM: IR RIGHT PERC INTRO URET CATH; RIGHT NEPHROSTOGRAM  MEDICATIONS AND MEDICAL HISTORY: Versed 10 mg, Fentanyl 6 under mcg.  As antibiotic prophylaxis, Cipro was ordered pre-procedure and administered intravenously within one hour of incision.  ANESTHESIA/SEDATION: Moderate sedation time: 50 minutes  CONTRAST:  15 cc Omnipaque 300  FLUOROSCOPY TIME:  25 min and 18 seconds.  PROCEDURE: The procedure, risks, benefits, and alternatives were explained to the patient. Questions regarding the procedure were encouraged and  answered. The patient understands and consents to the procedure.  The right that was prepped with Betadine in a sterile fashion, and a sterile drape was applied covering the operative field. A sterile gown and sterile gloves were used for the procedure.  The existing nephrostomy was cut and exchanged over a Bentson wire for a 5 [place] Kumpe the catheter. Contrast was injected for a nephrostogram. An attempt was made to cross the pelvis into the ureter unsuccessfully. The Kumpe the was exchanged for a 6 French sheath then a cobra 2 catheter and finally a 5 [place] glide catheter. This was advanced over a roadrunner wire into the pelvis and ureter and finally the bladder. Contrast was injected. It was sewn in place.  COMPLICATIONS: None  FINDINGS: Imaging demonstrates a large pelvic calculus. 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 The final image demonstrates a 5 [place] glide catheter extending from a lower pole calyx to the bladder.  IMPRESSION: Successful nephro ureteral catheter placement as described. UPJ stricture is noted.   Electronically Signed   By: Maryclare Bean M.D.   On: 05/31/2013 10:16    Anti-infectives: Anti-infectives   Start     Dose/Rate Route Frequency Ordered Stop   05/31/13 2000  ciprofloxacin (CIPRO) tablet 500 mg     500 mg Oral 2 times daily 05/31/13 1442     05/31/13 0730  ciprofloxacin (CIPRO) IVPB 400 mg  Status:  Discontinued     400 mg 200 mL/hr over 60 Minutes Intravenous On call 05/31/13 0724 05/31/13 0730   05/31/13 0724  ciprofloxacin (CIPRO) IVPB 400 mg  Status:  Discontinued     400 mg 200 mL/hr over 60 Minutes Intravenous 60 min pre-op 05/31/13 0724 05/31/13 1442      Current Facility-Administered Medications  Medication Dose Route Frequency Provider Last Rate Last Dose  . acetaminophen (TYLENOL) tablet 650 mg  650 mg Oral Q4H PRN Irine Seal, MD   650 mg at 06/01/13 2105  . bisacodyl (DULCOLAX) suppository 10 mg  10 mg Rectal Daily PRN Irine Seal, MD       . ciprofloxacin (CIPRO) tablet 500 mg  500 mg Oral BID Irine Seal, MD   500 mg at 06/02/13 0745  . diphenhydrAMINE (BENADRYL) injection 12.5-25 mg  12.5-25 mg Intravenous Q6H PRN Irine Seal, MD       Or  . diphenhydrAMINE (BENADRYL) 12.5 MG/5ML elixir 12.5-25 mg  12.5-25 mg Oral Q6H PRN Irine Seal, MD      . docusate sodium (COLACE) capsule 100 mg  100 mg Oral BID Irine Seal, MD   100 mg at 06/02/13 0910  . heparin ADULT infusion 100 units/mL (25000 units/250 mL)  1,750 Units/hr Intravenous Continuous Irine Seal, MD 17.5  mL/hr at 06/02/13 0911 1,750 Units/hr at 06/02/13 0911  . HYDROmorphone (DILAUDID) injection 1 mg  1 mg Intravenous Q2H PRN Irine Seal, MD   1 mg at 06/02/13 0745  . hyoscyamine (LEVSIN SL) SL tablet 0.125 mg  0.125 mg Oral Q4H PRN Irine Seal, MD   0.125 mg at 05/31/13 2015  . ipratropium-albuterol (DUONEB) 0.5-2.5 (3) MG/3ML nebulizer solution 3 mL  3 mL Nebulization Q2H PRN Robbie Lis, MD   3 mL at 06/01/13 1310  . levothyroxine (SYNTHROID, LEVOTHROID) tablet 175 mcg  175 mcg Oral QAC breakfast Irine Seal, MD   175 mcg at 06/02/13 0745  . lisinopril (PRINIVIL,ZESTRIL) tablet 40 mg  40 mg Oral q morning - 10a Irine Seal, MD   40 mg at 06/02/13 0910  . metFORMIN (GLUCOPHAGE) tablet 500 mg  500 mg Oral Q supper Irine Seal, MD   500 mg at 06/01/13 1651  . methylPREDNISolone sodium succinate (SOLU-MEDROL) 125 mg/2 mL injection 60 mg  60 mg Intravenous Q12H Robbie Lis, MD   60 mg at 06/02/13 0501  . ondansetron (ZOFRAN) injection 4 mg  4 mg Intravenous Q4H PRN Irine Seal, MD   4 mg at 06/02/13 0910  . oxyCODONE (Oxy IR/ROXICODONE) immediate release tablet 15 mg  15 mg Oral QID Irine Seal, MD   15 mg at 06/02/13 0910  . oxyCODONE (Oxy IR/ROXICODONE) immediate release tablet 5 mg  5 mg Oral Q4H PRN Irine Seal, MD   5 mg at 06/02/13 0500  . patient's guide to using coumadin book   Does not apply Once Posey Rea, [facility]      . warfarin (COUMADIN) video   Does not apply Once  Posey Rea, [facility]      . Warfarin - Pharmacist Dosing Inpatient   Does not apply [place], [facility] [address]        Assessment: s/p Procedure(s): NEPHROLITHOTOMY PERCUTANEOUS on the left   She continues to have SOB particularly when going to the bathroom. Her urine remains clear.   Plan: With the lack of bleeding at this point it will probably be safe to transition to lovenox to facilitate D/C.  She can probably be up with assistance but I will defer that to the hospitalist service.  H&H in the am.     LOS: 2 days    Malka So 06/02/2013

## 2013-06-03 ENCOUNTER — Inpatient Hospital Stay (HOSPITAL_COMMUNITY): Payer: 59

## 2013-06-03 LAB — BASIC METABOLIC PANEL
BUN: 17 mg/dL (ref 6–23)
CALCIUM: 9.3 mg/dL (ref 8.4–10.5)
CO2: 27 mEq/L (ref 19–32)
Chloride: 97 mEq/L (ref 96–112)
Creatinine, Ser: 0.92 mg/dL (ref 0.50–1.10)
GFR calc non Af Amer: 67 mL/min — ABNORMAL LOW (ref >90)
GFR, EST AFRICAN AMERICAN: 78 mL/min — AB (ref >90)
Glucose, Bld: 155 mg/dL — ABNORMAL HIGH (ref 70–99)
Potassium: 4.3 mEq/L (ref 3.7–5.3)
SODIUM: 135 meq/L — AB (ref 137–147)

## 2013-06-03 LAB — CBC
HCT: 34.5 % — ABNORMAL LOW (ref 36.0–46.0)
Hemoglobin: 11 g/dL — ABNORMAL LOW (ref 12.0–15.0)
MCH: 29 pg (ref 26.0–34.0)
MCHC: 31.9 g/dL (ref 30.0–36.0)
MCV: 91 fL (ref 78.0–100.0)
PLATELETS: 257 10*3/uL (ref 150–400)
RBC: 3.79 MIL/uL — AB (ref 3.87–5.11)
RDW: 14.4 % (ref 11.5–15.5)
WBC: 12.9 10*3/uL — ABNORMAL HIGH (ref 4.0–10.5)

## 2013-06-03 LAB — PROTIME-INR
INR: 1.05 (ref 0.00–1.49)
Prothrombin Time: 13.5 seconds (ref 11.6–15.2)

## 2013-06-03 MED ORDER — PREDNISONE 20 MG PO TABS
30.0000 mg | ORAL_TABLET | Freq: Every day | ORAL | Status: DC
  Administered 2013-06-04: 30 mg via ORAL
  Filled 2013-06-03 (×2): qty 1

## 2013-06-03 MED ORDER — WARFARIN - PHARMACIST DOSING INPATIENT: Freq: Every day | Status: DC

## 2013-06-03 MED ORDER — WARFARIN SODIUM 10 MG PO TABS
10.0000 mg | ORAL_TABLET | Freq: Once | ORAL | Status: DC
  Filled 2013-06-03: qty 1

## 2013-06-03 MED ORDER — LORAZEPAM 1 MG PO TABS
1.0000 mg | ORAL_TABLET | Freq: Four times a day (QID) | ORAL | Status: DC | PRN
  Administered 2013-06-03 – 2013-06-10 (×14): 1 mg via ORAL
  Filled 2013-06-03 (×14): qty 1

## 2013-06-03 MED ORDER — HYDROMORPHONE HCL PF 2 MG/ML IJ SOLN
2.0000 mg | INTRAMUSCULAR | Status: DC | PRN
  Administered 2013-06-03 – 2013-06-11 (×24): 2 mg via INTRAVENOUS
  Filled 2013-06-03 (×24): qty 1

## 2013-06-03 MED ORDER — LORAZEPAM 2 MG/ML IJ SOLN: 1.0000 mg | INTRAMUSCULAR | Status: DC | PRN

## 2013-06-03 NOTE — Progress Notes (Signed)
Patient ID: Tricia Perry, female   DOB: Sep 27, 1955, 58 y.o.   MRN: [id number] [facility] PROGRESS NOTE  Tricia Perry GYF:[id number] DOB: [birth year] DOA: 05/31/2013 PCP: Tammi Sou, MD  Brief narrative:  58 year old female with past medical history of asthma, hypertension, diabetes, kidney stone who presented to [facility] 05/31/13 for evaluation 2.3 cm stone found on CT scan. She subsequently underwent right percutaneous nephrolithotomy 05/31/2013. She was doing relatively ok post procedure but has developed sudden shortness of breath this morning for which reason [facility] consulted.   Assessment and Plan:  Principal Problem:  Acute respiratory distress  - secondary to to PE as confirmed by CT angio chest  - due to persistent hematuria, holding anticoagulation and will resume once this resolves - continue duoneb every 2 hours PRN  - d/c Solumedrol and place on Prednisone taper  - continue oxygen support via nasal canula to keep O2 saturation above 90%  - encouraged ambulation as pt able to tolerate  Active Problems:  Hematuria - persistent and likely worse now with anticoagulation - renal US ordered for further evaluation - holding Lovenox for now until hematuria resolves  Right renal stone  - status post right perc nephrolithotomy  - manage per GU  Hypertension  - continue lisinopril  Diabetes mellitus, type II  - continue metformin  Leukocytosis  - secondary to Solumedrol  - transition to Prednisone  Procedures/Studies:  Abd 1 View 06/01/2013 No visualize retained stone fragments.  Ct Angio Chest Pe W/cm &/or Wo Cm 06/01/2013 Pulmonary embolus within the right middle lobe pulmonary artery with segmental extension. The RV LV ratio is within normal limits. Interstitial infiltrate likely reflecting component of pulmonary edema an infectious or inflammatory abnormality cannot be excluded. Dg Chest Port 1 View 06/01/2013 Subsegmental bibasilar atelectasis in a low volume chest.  Antibiotics:   None  Code Status: Full  Family Communication: Pt at bedside  Disposition Plan: Home when medically stable   HPI/Subjective: No events overnight.   Objective: Filed Vitals:   06/02/13 0550 06/02/13 0910 06/02/13 1500 06/03/13 0443  BP: 139/70 145/62 136/72 137/78  Pulse: 88  80 83  Temp: 98.6 F (37 C)  97.8 F (36.6 C) 97.8 F (36.6 C)  TempSrc: Oral  Oral Oral  Resp: 24  22 20   Height:      Weight:      SpO2: 100%  97% 98%    Intake/Output Summary (Last 24 hours) at 06/03/13 1426 Last data filed at 06/03/13 1037  Gross per 24 hour  Intake    480 ml  Output   1200 ml  Net   -720 ml    Exam:   General:  Pt is alert, follows commands appropriately, not in acute distress  Cardiovascular: Regular rate and rhythm, S1/S2, no murmurs, no rubs, no gallops  Respiratory: Clear to auscultation bilaterally, no wheezing, no crackles, no rhonchi  Abdomen: Tender in upper abd quadrants, mostly on the right side and slightly distended, bowel sounds present, no guarding  Extremities: No edema, pulses DP and PT palpable bilaterally  Neuro: Grossly nonfocal  Data Reviewed: Basic Metabolic Panel:  Recent Labs Lab 05/31/13 0800 06/03/13 0411  NA 138 135*  K 4.3 4.3  CL 100 97  CO2 26 27  GLUCOSE 117* 155*  BUN 17 17  CREATININE 0.66 0.92  CALCIUM 9.6 9.3   CBC:  Recent Labs Lab 05/31/13 0800 05/31/13 1356 06/01/13 0454 06/02/13 1135 06/03/13 0411  WBC 6.2  --   --  14.8* 12.9*  HGB 12.5 12.5 11.9* 12.5 11.0*  HCT 37.8 37.9 37.7 38.4 34.5*  MCV 90.4  --   --  91.2 91.0  PLT 294  --   --  258 257   CBG:  Recent Labs Lab 05/31/13 0811 05/31/13 1351  GLUCAP 113* 88   Scheduled Meds: . docusate sodium  100 mg Oral BID  . levothyroxine  175 mcg Oral QAC breakfast  . lisinopril  40 mg Oral q morning - 10a  . metFORMIN  500 mg Oral Q supper  . methylPREDNISolone  inj  60 mg Intravenous Q12H  . oxyCODONE  15 mg Oral QID   Continuous Infusions:   Faye Ramsay, MD  [facility] Pager [phone]  If 7PM-7AM, please contact night-coverage [email or web address] Password [email or web address] 06/03/2013, 2:26 PM   LOS: 3 days

## 2013-06-03 NOTE — Progress Notes (Signed)
Patient had a restless night, urine out has ranged from 150-200cc bloody urine during the night. Patient has complained of increase pain right side during the night. Pain meds and meds for spasms administered as ordered. Minimal relief noted.  VSs stable. Will cont to monitor. SRP, RN, BSN

## 2013-06-03 NOTE — Progress Notes (Signed)
Notified NP Anders Grant concerning pain and hematuria...Marland KitchenMarland KitchenMarland Kitchen Per NP extender will hold am Lovenox until 8am to 9am, day RN is to notify MD attending for further orders. Pain med given as ordered and Lovenox held for 6am. SRP, RN

## 2013-06-03 NOTE — Progress Notes (Signed)
ANTICOAGULATION CONSULT NOTE - Follow Up Consult  Pharmacy Consult for Warfarin Indication: pulmonary embolus  Allergies  Allergen Reactions  . Codeine Itching    Back hurts  . Mold Extract [Trichophyton] Nausea And Vomiting and Other (See Comments)    Sneezing real bad   . Morphine Itching    back hurts    Patient Measurements: Height: 5\' 4"  (162.6 cm) Weight: 305 lb (138.347 kg) IBW/kg (Calculated) : 54.7 Heparin Dosing Weight: 90 kg  Vital Signs: Temp: 97.8 F (36.6 C) (03/06 0443) Temp src: Oral (03/06 0443) BP: 137/78 mmHg (03/06 0443) Pulse Rate: 83 (03/06 0443)  Labs:  Recent Labs  05/31/13 0800  06/01/13 0454 06/01/13 2333 06/02/13 0435 06/02/13 1135 06/03/13 0411  HGB 12.5  < > 11.9*  --   --  12.5 11.0*  HCT 37.8  < > 37.7  --   --  38.4 34.5*  PLT 294  --   --   --   --  258 257  APTT 29  --   --   --   --   --   --   LABPROT 11.7  --   --   --  13.0  --  13.5  INR 0.87  --   --   --  1.00  --  1.05  HEPARINUNFRC  --   --   --  0.30 0.29* 0.53  --   CREATININE 0.66  --   --   --   --   --  0.92  < > = values in this interval not displayed.  Estimated Creatinine Clearance: 92.7 ml/min (by C-G formula based on Cr of 0.92).   Medications:  Scheduled:  . docusate sodium  100 mg Oral BID  . enoxaparin (LOVENOX) injection  140 mg Subcutaneous Q12H  . levothyroxine  175 mcg Oral QAC breakfast  . lisinopril  40 mg Oral q morning - 10a  . metFORMIN  500 mg Oral Q supper  . methylPREDNISolone (SOLU-MEDROL) injection  60 mg Intravenous Q12H  . oxyCODONE  15 mg Oral QID  . patient's guide to using coumadin book   Does not apply Once  . Warfarin - Pharmacist Dosing Inpatient   Does not apply q1800   Infusions:     Assessment: 70 yoF s/p right perc nephrolithotomy on 3/3 and developed SOB on 3/4.  CT angio confirm acute right middle lobe PE.  Pharmacy is consulted to dose IV heparin (started 3/4) bridge to warfarin.  Heparin level 3/5 was therapeutic  and no bleeding was reported.  Urology and [facility] in agreement with change from IV Heparin to SQ Lovenox (started 3/5 PM).  Pharmacy is consulted to dose Lovenox.   Overnight, pt reported increased flank pain and hematuria with clots.  She had received her first Lovenox dose at 1800.  The 0600 Lovenox dose was held and the order discontinued.  Per urology notes, plan to hold parenteral anticoagulation until hematuria resolves, then resume with Heparin drip.   CBC: Hgb decreased to 11, Plt stable at 257.    SCr increased (0.66 > 0.92) with CrCl > 93 ml/min  INR 1.05, remains subtherapeutic as expected during warfarin initiation.  Note Cipro was discontinued (decreased drug-drug interaction with warfarin)  Warfarin education completed 06/02/13   Goal of Therapy:  INR 2-3 Heparin level 0.3-0.7 units/ml Monitor platelets by anticoagulation protocol: Yes   Plan:   Hold anticoagulation, f/u plans per Urology  If hematuria is resolved, recommend warfarin 10  mg PO today at 1800.  No orders entered at this time.  If heparin is resumed, level was last therapeutic on heparin 1750 units/hr.  Daily INR and CBC  Continue to monitor H&H and platelets   Gretta Arab PharmD, BCPS Pager [phone] 06/03/2013 8:06 AM

## 2013-06-03 NOTE — Progress Notes (Signed)
Patient ID: Tricia Perry, female   DOB: November 04, 1955, 58 y.o.   MRN: [id number] 3 Days Post-Op  Subjective: Ed is 3 days post of from a right PCNL and developed a PE on POD 1.   She was started on a heparin drip and her urine remained clear for 24 hours.   She was transitioned to lovenox last night but began to have hematuria with clots and increased flank pain.   Her Hgb has declined from 12.5 to 11.  Her INR is only 1.05.    ROS: Negative except for back pain and nausea but no fever.   Objective: Vital signs in last 24 hours: Temp:  [97.8 F (36.6 C)] 97.8 F (36.6 C) (03/06 0443) Pulse Rate:  [80-83] 83 (03/06 0443) Resp:  [20-22] 20 (03/06 0443) BP: (136-145)/(62-78) 137/78 mmHg (03/06 0443) SpO2:  [97 %-98 %] 98 % (03/06 0443)  Intake/Output from previous day: 03/05 0701 - 03/06 0700 In: 480 [P.O.:480] Out: 900 [Urine:900] Intake/Output this shift:    General appearance: alert, no distress and uncomfortable. Resp: clear to auscultation bilaterally and with poor effort, but her sats are good.  Cardio: regular rate and rhythm GI: obese but some what more firm with significant RCVAT and RUQ tenderness.  +BS  Lab Results:   Recent Labs  06/02/13 1135 06/03/13 0411  WBC 14.8* 12.9*  HGB 12.5 11.0*  HCT 38.4 34.5*  PLT 258 257   BMET  Recent Labs  05/31/13 0800 06/03/13 0411  NA 138 135*  K 4.3 4.3  CL 100 97  CO2 26 27  GLUCOSE 117* 155*  BUN 17 17  CREATININE 0.66 0.92  CALCIUM 9.6 9.3   PT/INR  Recent Labs  06/02/13 0435 06/03/13 0411  LABPROT 13.0 13.5  INR 1.00 1.05   ABG No results found for this basename: PHART, PCO2, PO2, HCO3,  in the last 72 hours  Studies/Results: Ct Angio Chest Pe W/cm &/or Wo Cm  06/01/2013   CLINICAL DATA:  Evaluate for pulmonary arterial embolic disease.  EXAM: CT ANGIOGRAPHY CHEST WITH CONTRAST  TECHNIQUE: Multidetector CT imaging of the chest was performed using the standard protocol during bolus administration of  intravenous contrast. Multiplanar CT image reconstructions and MIPs were obtained to evaluate the vascular anatomy.  CONTRAST:  188mL OMNIPAQUE IOHEXOL 350 MG/ML SOLN  COMPARISON:  None.  FINDINGS: The thoracic inlet is unremarkable.  Evaluation of mediastinal hilar region structures demonstrate no evidence of adenopathy nor masses. The RV/ LV ratio calculated at 0.77 which is normal. Multichamber cardiac enlargement is appreciated.  A filling defect is appreciated within the right middle lobe pulmonary artery with extension into a segmental branch. Findings appreciated on image 29 series 4.  Evaluation of the lung parenchyma is degraded by respiratory artifact. There is diffuse ground-glass density throughout both lungs as well as thickening of the interstitial markings. Ill-defined areas of increased density project within the lung bases with linear and consolidative components.  A trace right pleural effusion is appreciated.  Visualized upper abdominal viscera demonstrate postsurgical changes within the stomach, otherwise grossly unremarkable.  Review of the MIP images confirms the above findings.  IMPRESSION: Pulmonary embolus within the right middle lobe pulmonary artery with segmental extension. The RV LV ratio is within normal limits.  Interstitial infiltrate likely reflecting component of pulmonary edema an infectious or inflammatory abnormality cannot be excluded. These results will be called to the ordering clinician or representative by the Radiologist Assistant, and communication documented in the PACS  Dashboard.   Electronically Signed   By: Salome Holmes M.D.   On: 06/01/2013 16:23   Dg Chest Port 1 View  06/01/2013   CLINICAL DATA:  Shortness of breath.  EXAM: PORTABLE CHEST - 1 VIEW  COMPARISON:  PA and lateral chest and CT chest 04/20/2013.  FINDINGS: Subsegmental atelectasis is seen in the lung bases. Lung volumes are low. No consolidative process, pneumothorax or effusion. Heart size is normal.   IMPRESSION: Subsegmental bibasilar atelectasis in a low volume chest.   Electronically Signed   By: Drusilla Kanner M.D.   On: 06/01/2013 09:50    Anti-infectives: Anti-infectives   Start     Dose/Rate Route Frequency Ordered Stop   05/31/13 2000  ciprofloxacin (CIPRO) tablet 500 mg  Status:  Discontinued     500 mg Oral 2 times daily 05/31/13 1442 06/02/13 1330   05/31/13 0730  ciprofloxacin (CIPRO) IVPB 400 mg  Status:  Discontinued     400 mg 200 mL/hr over 60 Minutes Intravenous On call 05/31/13 0724 05/31/13 0730   05/31/13 0724  ciprofloxacin (CIPRO) IVPB 400 mg  Status:  Discontinued     400 mg 200 mL/hr over 60 Minutes Intravenous 60 min pre-op 05/31/13 0724 05/31/13 1442      Current Facility-Administered Medications  Medication Dose Route Frequency Provider Last Rate Last Dose  . acetaminophen (TYLENOL) tablet 650 mg  650 mg Oral Q4H PRN Bjorn Pippin, MD   650 mg at 06/03/13 0253  . bisacodyl (DULCOLAX) suppository 10 mg  10 mg Rectal Daily PRN Bjorn Pippin, MD   10 mg at 06/02/13 2049  . diphenhydrAMINE (BENADRYL) injection 12.5-25 mg  12.5-25 mg Intravenous Q6H PRN Bjorn Pippin, MD       Or  . diphenhydrAMINE (BENADRYL) 12.5 MG/5ML elixir 12.5-25 mg  12.5-25 mg Oral Q6H PRN Bjorn Pippin, MD      . docusate sodium (COLACE) capsule 100 mg  100 mg Oral BID Bjorn Pippin, MD   100 mg at 06/02/13 2156  . enoxaparin (LOVENOX) injection 140 mg  140 mg Subcutaneous Q12H Winfield Rast, RPH   140 mg at 06/02/13 1804  . HYDROmorphone (DILAUDID) injection 1 mg  1 mg Intravenous Q2H PRN Bjorn Pippin, MD   1 mg at 06/03/13 0549  . hyoscyamine (LEVSIN SL) SL tablet 0.125 mg  0.125 mg Oral Q4H PRN Bjorn Pippin, MD   0.125 mg at 06/03/13 0253  . ipratropium-albuterol (DUONEB) 0.5-2.5 (3) MG/3ML nebulizer solution 3 mL  3 mL Nebulization Q2H PRN Alison Murray, MD   3 mL at 06/01/13 1310  . levothyroxine (SYNTHROID, LEVOTHROID) tablet 175 mcg  175 mcg Oral QAC breakfast Bjorn Pippin, MD   175 mcg at  06/02/13 0745  . lisinopril (PRINIVIL,ZESTRIL) tablet 40 mg  40 mg Oral q morning - 10a Bjorn Pippin, MD   40 mg at 06/02/13 0910  . metFORMIN (GLUCOPHAGE) tablet 500 mg  500 mg Oral Q supper Bjorn Pippin, MD   500 mg at 06/02/13 1804  . methylPREDNISolone sodium succinate (SOLU-MEDROL) 125 mg/2 mL injection 60 mg  60 mg Intravenous Q12H Alison Murray, MD   60 mg at 06/03/13 0110  . ondansetron (ZOFRAN) injection 4 mg  4 mg Intravenous Q4H PRN Bjorn Pippin, MD   4 mg at 06/03/13 0208  . oxyCODONE (Oxy IR/ROXICODONE) immediate release tablet 15 mg  15 mg Oral QID Bjorn Pippin, MD   15 mg at 06/02/13 2156  . oxyCODONE (Oxy IR/ROXICODONE) immediate release  tablet 5 mg  5 mg Oral Q4H PRN Irine Seal, MD   5 mg at 06/03/13 0548  . patient's guide to using coumadin book   Does not apply Once Posey Rea, [facility]      . Warfarin - Pharmacist Dosing Inpatient   Does not apply [place], [facility]        Assessment: s/p Procedure(s): NEPHROLITHOTOMY PERCUTANEOUS  She had hematuria with clot colic after transitioning to lovenox.   She has started warfarin but her INR is only 1.05.   Her Hgb is down 1.5gm over night.   Plan: Hold lovenox and warfarin until hematuria resolves and then she can resume the heparin drip and warfarin.   Renal US today to r/o hematoma and hydro.  I will add ativan for her anxiety.      LOS: 3 days    Rainah Kirshner J 06/03/2013

## 2013-06-04 LAB — CBC
HCT: 35.4 % — ABNORMAL LOW (ref 36.0–46.0)
Hemoglobin: 11.3 g/dL — ABNORMAL LOW (ref 12.0–15.0)
MCH: 29.2 pg (ref 26.0–34.0)
MCHC: 31.9 g/dL (ref 30.0–36.0)
MCV: 91.5 fL (ref 78.0–100.0)
PLATELETS: 274 10*3/uL (ref 150–400)
RBC: 3.87 MIL/uL (ref 3.87–5.11)
RDW: 14.3 % (ref 11.5–15.5)
WBC: 13 10*3/uL — AB (ref 4.0–10.5)

## 2013-06-04 LAB — BASIC METABOLIC PANEL
BUN: 23 mg/dL (ref 6–23)
CHLORIDE: 95 meq/L — AB (ref 96–112)
CO2: 32 meq/L (ref 19–32)
Calcium: 9.5 mg/dL (ref 8.4–10.5)
Creatinine, Ser: 1.1 mg/dL (ref 0.50–1.10)
GFR calc Af Amer: 63 mL/min — ABNORMAL LOW (ref >90)
GFR calc non Af Amer: 54 mL/min — ABNORMAL LOW (ref >90)
Glucose, Bld: 110 mg/dL — ABNORMAL HIGH (ref 70–99)
Potassium: 4.2 mEq/L (ref 3.7–5.3)
Sodium: 135 mEq/L — ABNORMAL LOW (ref 137–147)

## 2013-06-04 LAB — PROTIME-INR
INR: 1.03 (ref 0.00–1.49)
Prothrombin Time: 13.3 seconds (ref 11.6–15.2)

## 2013-06-04 MED ORDER — PREDNISONE 20 MG PO TABS
20.0000 mg | ORAL_TABLET | Freq: Every day | ORAL | Status: DC
  Administered 2013-06-05: 20 mg via ORAL
  Filled 2013-06-04 (×2): qty 1

## 2013-06-04 NOTE — Progress Notes (Signed)
Spoke with MD concerning pt's recent bout with pain to lower, right abdomen and radiating around to back.  This pain came on suddenly (also, pt's spouse had just told her that he was leaving at this time and not spending the night with her at hospital tonight). Pt has been receiving ordered/scheduled pain meds around the clock. Got pt on BSC with X 2 assist and she had about 75cc of dark, red urine with noted "small clots". After this urination, pt got back in bed and stated the pain had "eased off". Writer told spouse to go ahead and leave. Monitoring pt closely, who at this time is asleep.

## 2013-06-04 NOTE — Progress Notes (Signed)
Patient ID: Tricia Perry, female   DOB: 1955-12-29, 58 y.o.   MRN: [id number] [facility] PROGRESS NOTE  Tricia Perry HQI:[id number] DOB: Mar 30, 1956 DOA: 05/31/2013 PCP: Tricia Sou, MD  Brief narrative:  58 year old female with past medical history of asthma, hypertension, diabetes, kidney stone who presented to [facility] 05/31/13 for evaluation 2.3 cm stone found on CT scan. She subsequently underwent right percutaneous nephrolithotomy 05/31/2013. She was doing relatively ok post procedure but has developed sudden shortness of breath this morning for which reason [facility] consulted.   Assessment and Plan:  Principal Problem:  Acute respiratory distress  - secondary to to PE as confirmed by CT angio chest  - due to persistent hematuria, holding anticoagulation and will resume once this resolves  - continue duoneb every 2 hours PRN  - continue Prednisone taper - continue oxygen support via nasal canula to keep O2 saturation above 90%  - encouraged ambulation as pt able to tolerate  Active Problems:  Hematuria  - persistent - renal US with no acute abnormalities that could explain etiology  - holding Lovenox for now until hematuria resolves  Right renal stone  - status post right perc nephrolithotomy  - manage per GU  Hypertension  - continue lisinopril  Diabetes mellitus, type II  - continue metformin  Leukocytosis  - secondary to Solumedrol  - transition to Prednisone   Procedures/Studies:  Abd 1 View 06/01/2013 No visualize retained stone fragments.  Ct Angio Chest Pe W/cm &/or Wo Cm 06/01/2013 Pulmonary embolus within the right middle lobe pulmonary artery with segmental extension. The RV LV ratio is within normal limits. Interstitial infiltrate likely reflecting component of pulmonary edema an infectious or inflammatory abnormality cannot be excluded.  Dg Chest Port 1 View 06/01/2013 Subsegmental bibasilar atelectasis in a low volume chest.  Antibiotics:  None  Code Status: Full   Family Communication: Pt and husband at bedside  Disposition Plan: Home when medically stable    HPI/Subjective: No events overnight. Still with hematuria.   Objective: Filed Vitals:   06/03/13 0443 06/03/13 1430 06/03/13 2137 06/04/13 0621  BP: 137/78 134/76 130/64 121/47  Pulse: 83 87 100 85  Temp: 97.8 F (36.6 C) 97.8 F (36.6 C) 98.1 F (36.7 C) 98.3 F (36.8 C)  TempSrc: Oral Oral Oral Oral  Resp: 20 20 20 18   Height:      Weight:      SpO2: 98% 98% 97% 96%    Intake/Output Summary (Last 24 hours) at 06/04/13 1518 Last data filed at 06/04/13 1308  Gross per 24 hour  Intake    680 ml  Output   1150 ml  Net   -470 ml    Exam:   General:  Pt is alert, follows commands appropriately, not in acute distress  Cardiovascular: Regular rate and rhythm, S1/S2, no murmurs, no rubs, no gallops  Respiratory: Clear to auscultation bilaterally, no wheezing, no crackles, no rhonchi  Abdomen: Soft, tender in upper abdominal quadrants, non distended, bowel sounds present, no guarding  Extremities: No edema, pulses DP and PT palpable bilaterally  Neuro: Grossly nonfocal  Data Reviewed: Basic Metabolic Panel:  Recent Labs Lab 05/31/13 0800 06/03/13 0411 06/04/13 0455  NA 138 135* 135*  K 4.3 4.3 4.2  CL 100 97 95*  CO2 26 27 32  GLUCOSE 117* 155* 110*  BUN 17 17 23   CREATININE 0.66 0.92 1.10  CALCIUM 9.6 9.3 9.5   CBC:  Recent Labs Lab 05/31/13 0800 05/31/13 1356  06/01/13 0454 06/02/13 1135 06/03/13 0411 06/04/13 0455  WBC 6.2  --   --  14.8* 12.9* 13.0*  HGB 12.5 12.5 11.9* 12.5 11.0* 11.3*  HCT 37.8 37.9 37.7 38.4 34.5* 35.4*  MCV 90.4  --   --  91.2 91.0 91.5  PLT 294  --   --  258 257 274   CBG:  Recent Labs Lab 05/31/13 0811 05/31/13 1351  GLUCAP 113* 88   Scheduled Meds: . docusate sodium  100 mg Oral BID  . levothyroxine  175 mcg Oral QAC breakfast  . lisinopril  40 mg Oral q morning - 10a  . metFORMIN  500 mg Oral Q supper  .  oxyCODONE  15 mg Oral QID  . patient's guide to using coumadin book   Does not apply Once  . predniSONE  30 mg Oral Q breakfast   Continuous Infusions:  Faye Ramsay, MD  [facility] Pager [phone]  If 7PM-7AM, please contact night-coverage [email or web address] Password [email or web address] 06/04/2013, 3:18 PM   LOS: 4 days

## 2013-06-04 NOTE — Progress Notes (Addendum)
Patient ID: Tricia Perry, female   DOB: 1955/07/28, 58 y.o.   MRN: [id number] 4 Days Post-Op  Subjective: Tricia Perry is 3 days post of from a right PCNL and developed a PE on POD 1.   She was started on a heparin drip and her urine remained clear for 24 hours.   She was transitioned to lovenox last night but began to have hematuria with clots and increased flank pain.   Her Hgb has declined from 12.5 to 11.  Her INR is only 1.05.   Anticoagulation held.  Renal ultrasound performed, no evidence of peri-nephric hematoma.   No events overnight.  Urine this AM is dark colored.  Flank pain improved.   ROS: Negative except for back pain and nausea but no fever.   Objective: Vital signs in last 24 hours: Temp:  [97.8 F (36.6 C)-98.3 F (36.8 C)] 98.3 F (36.8 C) (03/07 0621) Pulse Rate:  [85-100] 85 (03/07 0621) Resp:  [18-20] 18 (03/07 0621) BP: (121-134)/(47-76) 121/47 mmHg (03/07 0621) SpO2:  [96 %-98 %] 96 % (03/07 0621)  Intake/Output from previous day: 03/06 0701 - 03/07 0700 In: 680 [P.O.:680] Out: 1350 [Urine:1350] Intake/Output this shift: Total I/O In: 240 [P.O.:240] Out: -   General appearance: alert, no distress and uncomfortable. Resp: clear to auscultation bilaterally and with poor effort, but her sats are good.  Cardio: regular rate and rhythm GI: obese but some what more firm with significant RCVAT and RUQ tenderness.  +BS Incisions is dressed, c/d/i. Urine is brown with small flecks of blood.  Lab Results:   Recent Labs  06/03/13 0411 06/04/13 0455  WBC 12.9* 13.0*  HGB 11.0* 11.3*  HCT 34.5* 35.4*  PLT 257 274   BMET  Recent Labs  06/03/13 0411 06/04/13 0455  NA 135* 135*  K 4.3 4.2  CL 97 95*  CO2 27 32  GLUCOSE 155* 110*  BUN 17 23  CREATININE 0.92 1.10  CALCIUM 9.3 9.5   PT/INR  Recent Labs  06/03/13 0411 06/04/13 0455  LABPROT 13.5 13.3  INR 1.05 1.03   ABG No results found for this basename: PHART, PCO2, PO2, HCO3,  in the last 72  hours  Studies/Results: US Renal  06/03/2013   CLINICAL DATA:  Evaluate for hydronephrosis.  EXAM: RENAL/URINARY TRACT ULTRASOUND COMPLETE  COMPARISON:  None.  FINDINGS: Right Kidney:  Length: 13 cm. Echogenicity within normal limits. No mass or hydronephrosis visualized. Scarring and cortical volume loss from the upper pole the right kidney is noted. Dilatation of the upper pole calices is again identified. Echogenic material within the central pelvis is favored to represent gas. No renal stone noted. No perinephric fluid collections identified  Left Kidney:  Length: 12.7 cm. Echogenicity within normal limits. No mass or hydronephrosis visualized.  Bladder:  Appears normal for degree of bladder distention.  IMPRESSION: No perinephric fluid collections identified to suggest hematoma or urinoma status post nephrolithotomy.  Persistent dilatation of the upper pole caliectasis of the right kidney and with associated cortical volume loss from the upper pole likely due to chronic obstruction.  Normal appearing left kidney.   Electronically Signed   By: Signa Kell M.D.   On: 06/03/2013 16:12    Anti-infectives: Anti-infectives   Start     Dose/Rate Route Frequency Ordered Stop   05/31/13 2000  ciprofloxacin (CIPRO) tablet 500 mg  Status:  Discontinued     500 mg Oral 2 times daily 05/31/13 1442 06/02/13 1330   05/31/13 0730  ciprofloxacin (CIPRO) IVPB 400 mg  Status:  Discontinued     400 mg 200 mL/hr over 60 Minutes Intravenous On call 05/31/13 0724 05/31/13 0730   05/31/13 0724  ciprofloxacin (CIPRO) IVPB 400 mg  Status:  Discontinued     400 mg 200 mL/hr over 60 Minutes Intravenous 60 min pre-op 05/31/13 0724 05/31/13 1442      Current Facility-Administered Medications  Medication Dose Route Frequency Provider Last Rate Last Dose  . acetaminophen (TYLENOL) tablet 650 mg  650 mg Oral Q4H PRN Irine Seal, MD   650 mg at 06/04/13 0443  . bisacodyl (DULCOLAX) suppository 10 mg  10 mg Rectal Daily  PRN Irine Seal, MD   10 mg at 06/02/13 2049  . diphenhydrAMINE (BENADRYL) injection 12.5-25 mg  12.5-25 mg Intravenous Q6H PRN Irine Seal, MD       Or  . diphenhydrAMINE (BENADRYL) 12.5 MG/5ML elixir 12.5-25 mg  12.5-25 mg Oral Q6H PRN Irine Seal, MD      . docusate sodium (COLACE) capsule 100 mg  100 mg Oral BID Irine Seal, MD   100 mg at 06/04/13 0943  . HYDROmorphone (DILAUDID) injection 2 mg  2 mg Intravenous Q2H PRN Irine Seal, MD   2 mg at 06/04/13 0943  . hyoscyamine (LEVSIN SL) SL tablet 0.125 mg  0.125 mg Oral Q4H PRN Irine Seal, MD   0.125 mg at 06/03/13 0253  . ipratropium-albuterol (DUONEB) 0.5-2.5 (3) MG/3ML nebulizer solution 3 mL  3 mL Nebulization Q2H PRN Robbie Lis, MD   3 mL at 06/01/13 1310  . levothyroxine (SYNTHROID, LEVOTHROID) tablet 175 mcg  175 mcg Oral QAC breakfast Irine Seal, MD   175 mcg at 06/04/13 [phone]  . lisinopril (PRINIVIL,ZESTRIL) tablet 40 mg  40 mg Oral q morning - 10a Irine Seal, MD   40 mg at 06/04/13 0943  . LORazepam (ATIVAN) tablet 1 mg  1 mg Oral Q6H PRN Irine Seal, MD   1 mg at 06/04/13 0546   Or  . LORazepam (ATIVAN) injection 1 mg  1 mg Intramuscular Q4H PRN Irine Seal, MD      . metFORMIN (GLUCOPHAGE) tablet 500 mg  500 mg Oral Q supper Irine Seal, MD   500 mg at 06/03/13 1754  . ondansetron (ZOFRAN) injection 4 mg  4 mg Intravenous Q4H PRN Irine Seal, MD   4 mg at 06/03/13 0208  . oxyCODONE (Oxy IR/ROXICODONE) immediate release tablet 15 mg  15 mg Oral QID Irine Seal, MD   15 mg at 06/04/13 9604  . oxyCODONE (Oxy IR/ROXICODONE) immediate release tablet 5 mg  5 mg Oral Q4H PRN Irine Seal, MD   5 mg at 06/04/13 0443  . patient's guide to using coumadin book   Does not apply Once Posey Rea, [facility]      . predniSONE (DELTASONE) tablet 30 mg  30 mg Oral Q breakfast Theodis Blaze, MD   30 mg at 06/04/13 5409    Renal U/S, 06/03/13: IMPRESSION:  No perinephric fluid collections identified to suggest hematoma or  urinoma status post  nephrolithotomy.  Persistent dilatation of the upper pole caliectasis of the right  kidney and with associated cortical volume loss from the upper pole  likely due to chronic obstruction.  Normal appearing left kidney.   Assessment: s/p Procedure(s): NEPHROLITHOTOMY PERCUTANEOUS  She had hematuria with clot colic after transitioning to lovenox.   She has started warfarin but her INR is only 1.05.   Hgb stable and improved  flank pain. persistent hematuria, although likely resolving, old blood.  Plan: Hold lovenox and warfarin until hematuria resolves and then she can resume the heparin drip and warfarin.   Ativan for her anxiety.  Recommended bed rest until hematuria resolves, hope to resume anticoagulation tomorrow.  Appreciate help by hospitalist.     LOS: 4 days    Ardis Hughs 06/04/2013

## 2013-06-05 LAB — CBC
HCT: 34.6 % — ABNORMAL LOW (ref 36.0–46.0)
HEMOGLOBIN: 11.2 g/dL — AB (ref 12.0–15.0)
MCH: 29.5 pg (ref 26.0–34.0)
MCHC: 32.4 g/dL (ref 30.0–36.0)
MCV: 91.1 fL (ref 78.0–100.0)
Platelets: 287 10*3/uL (ref 150–400)
RBC: 3.8 MIL/uL — AB (ref 3.87–5.11)
RDW: 14.3 % (ref 11.5–15.5)
WBC: 11.5 10*3/uL — AB (ref 4.0–10.5)

## 2013-06-05 LAB — BASIC METABOLIC PANEL
BUN: 23 mg/dL (ref 6–23)
CHLORIDE: 95 meq/L — AB (ref 96–112)
CO2: 30 meq/L (ref 19–32)
Calcium: 9.1 mg/dL (ref 8.4–10.5)
Creatinine, Ser: 1.04 mg/dL (ref 0.50–1.10)
GFR calc Af Amer: 67 mL/min — ABNORMAL LOW (ref >90)
GFR calc non Af Amer: 58 mL/min — ABNORMAL LOW (ref >90)
GLUCOSE: 115 mg/dL — AB (ref 70–99)
POTASSIUM: 4.3 meq/L (ref 3.7–5.3)
SODIUM: 135 meq/L — AB (ref 137–147)

## 2013-06-05 LAB — PROTIME-INR
INR: 0.95 (ref 0.00–1.49)
PROTHROMBIN TIME: 12.5 s (ref 11.6–15.2)

## 2013-06-05 MED ORDER — HEPARIN (PORCINE) IN NACL 100-0.45 UNIT/ML-% IJ SOLN
1700.0000 [IU]/h | INTRAMUSCULAR | Status: DC
  Administered 2013-06-05: 1700 [IU]/h via INTRAVENOUS
  Filled 2013-06-05 (×3): qty 250

## 2013-06-05 MED ORDER — SODIUM CHLORIDE 0.9 % IV SOLN
INTRAVENOUS | Status: DC
  Administered 2013-06-05 – 2013-06-08 (×3): via INTRAVENOUS

## 2013-06-05 MED ORDER — PREDNISONE 10 MG PO TABS
10.0000 mg | ORAL_TABLET | Freq: Every day | ORAL | Status: DC
  Administered 2013-06-06 – 2013-06-07 (×2): 10 mg via ORAL
  Filled 2013-06-05 (×3): qty 1

## 2013-06-05 NOTE — Progress Notes (Signed)
ANTICOAGULATION CONSULT NOTE - Follow Up Consult  Pharmacy Consult for Heparin Indication: pulmonary embolus  Allergies  Allergen Reactions  . Codeine Itching    Back hurts  . Mold Extract [Trichophyton] Nausea And Vomiting and Other (See Comments)    Sneezing real bad   . Morphine Itching    back hurts    Patient Measurements: Height: 5\' 4"  (162.6 cm) Weight: 305 lb (138.347 kg) IBW/kg (Calculated) : 54.7 Heparin Dosing Weight: 90 kg  Vital Signs: Temp: 97.6 F (36.4 C) (03/08 1516) Temp src: Tympanic (03/08 1516) BP: 97/56 mmHg (03/08 1516) Pulse Rate: 103 (03/08 1516)  Labs:  Recent Labs  06/03/13 0411 06/04/13 0455 06/05/13 0522  HGB 11.0* 11.3* 11.2*  HCT 34.5* 35.4* 34.6*  PLT 257 274 287  LABPROT 13.5 13.3 12.5  INR 1.05 1.03 0.95  CREATININE 0.92 1.10 1.04    Estimated Creatinine Clearance: 82 ml/min (by C-G formula based on Cr of 1.04).   Medications:  Scheduled:  . docusate sodium  100 mg Oral BID  . levothyroxine  175 mcg Oral QAC breakfast  . lisinopril  40 mg Oral q morning - 10a  . metFORMIN  500 mg Oral Q supper  . oxyCODONE  15 mg Oral QID  . patient's guide to using coumadin book   Does not apply Once  . [START ON 06/06/2013] predniSONE  10 mg Oral Q breakfast   Infusions:     Assessment: 19 yoF s/p right perc nephrolithotomy on 3/3 and developed SOB on 3/4.  CT angio confirm acute right middle lobe PE. Started on anticoagulation with heparin/warfarin, then changed to lovenox/warfarin on 3/5. All stopped on 3/6 when patient developed hematuria. Renal US showed no evidence of per-nephric hematoma. Hgb has remained stable and hematuria improving. Per Urology, ok to slowly resume heparin today (3/8) and monitor closely for re-bleeding. Warfarin remains on hold for now.  Goal of Therapy:  Heparin levels 0.3-0.7 Monitor platelets by anticoagulation protocol: Yes   Plan:   Begin heparin 1700 units/hr (no bolus) based on previous  levels/rates  Check heparin level in 8hrs  Daily heparin level and CBC  F/u plan for resuming warfarin  Peggyann Juba, PharmD, BCPS Pager: [phone] 06/05/2013 3:53 PM

## 2013-06-05 NOTE — Progress Notes (Signed)
Patient ID: Tricia Perry, female   DOB: Jun 18, 1955, 58 y.o.   MRN: [id number] 5 Days Post-Op  Subjective: Tricia Perry is 4 days post of from a right PCNL and developed a PE on POD 1.   She was started on a heparin drip and her urine remained clear for 24 hours.   She was transitioned to lovenox last night but began to have hematuria with clots and increased flank pain.   Her Hgb had declined from 12.5 to 11.  Her INR was only 1.05.   Anticoagulation held.  Renal ultrasound performed, no evidence of peri-nephric hematoma.   Intv:  hgb stable, urine brown.  No complaints this AM   ROS: Negative except for back pain and nausea but no fever.   Objective: Vital signs in last 24 hours: Temp:  [98.1 F (36.7 C)-98.3 F (36.8 C)] 98.1 F (36.7 C) (03/08 0528) Pulse Rate:  [100-112] 103 (03/08 0528) Resp:  [18-20] 18 (03/08 0528) BP: (100-144)/(52-79) 112/52 mmHg (03/08 0528) SpO2:  [93 %-95 %] 93 % (03/08 0528)  Intake/Output from previous day: 03/07 0701 - 03/08 0700 In: 1080 [P.O.:1080] Out: 850 [Urine:850] Intake/Output this shift:    General appearance: alert, no distress and uncomfortable. Resp: clear to auscultation bilaterally and with poor effort, but her sats are good.  Cardio: regular rate and rhythm GI: obese but some what more firm with significant RCVAT and RUQ tenderness.  +BS Incisions is dressed, c/d/i. Urine is brown  Lab Results:   Recent Labs  06/04/13 0455 06/05/13 0522  WBC 13.0* 11.5*  HGB 11.3* 11.2*  HCT 35.4* 34.6*  PLT 274 287   BMET  Recent Labs  06/04/13 0455 06/05/13 0522  NA 135* 135*  K 4.2 4.3  CL 95* 95*  CO2 32 30  GLUCOSE 110* 115*  BUN 23 23  CREATININE 1.10 1.04  CALCIUM 9.5 9.1   PT/INR  Recent Labs  06/04/13 0455 06/05/13 0522  LABPROT 13.3 12.5  INR 1.03 0.95   ABG No results found for this basename: PHART, PCO2, PO2, HCO3,  in the last 72 hours  Studies/Results: US Renal  06/03/2013   CLINICAL DATA:  Evaluate for  hydronephrosis.  EXAM: RENAL/URINARY TRACT ULTRASOUND COMPLETE  COMPARISON:  None.  FINDINGS: Right Kidney:  Length: 13 cm. Echogenicity within normal limits. No mass or hydronephrosis visualized. Scarring and cortical volume loss from the upper pole the right kidney is noted. Dilatation of the upper pole calices is again identified. Echogenic material within the central pelvis is favored to represent gas. No renal stone noted. No perinephric fluid collections identified  Left Kidney:  Length: 12.7 cm. Echogenicity within normal limits. No mass or hydronephrosis visualized.  Bladder:  Appears normal for degree of bladder distention.  IMPRESSION: No perinephric fluid collections identified to suggest hematoma or urinoma status post nephrolithotomy.  Persistent dilatation of the upper pole caliectasis of the right kidney and with associated cortical volume loss from the upper pole likely due to chronic obstruction.  Normal appearing left kidney.   Electronically Signed   By: Kerby Moors M.D.   On: 06/03/2013 16:12    Anti-infectives: Anti-infectives   Start     Dose/Rate Route Frequency Ordered Stop   05/31/13 2000  ciprofloxacin (CIPRO) tablet 500 mg  Status:  Discontinued     500 mg Oral 2 times daily 05/31/13 1442 06/02/13 1330   05/31/13 0730  ciprofloxacin (CIPRO) IVPB 400 mg  Status:  Discontinued     400  mg 200 mL/hr over 60 Minutes Intravenous On call 05/31/13 0724 05/31/13 0730   05/31/13 0724  ciprofloxacin (CIPRO) IVPB 400 mg  Status:  Discontinued     400 mg 200 mL/hr over 60 Minutes Intravenous 60 min pre-op 05/31/13 0724 05/31/13 1442      Current Facility-Administered Medications  Medication Dose Route Frequency Provider Last Rate Last Dose  . acetaminophen (TYLENOL) tablet 650 mg  650 mg Oral Q4H PRN Irine Seal, MD   650 mg at 06/04/13 0443  . bisacodyl (DULCOLAX) suppository 10 mg  10 mg Rectal Daily PRN Irine Seal, MD   10 mg at 06/02/13 2049  . diphenhydrAMINE (BENADRYL)  injection 12.5-25 mg  12.5-25 mg Intravenous Q6H PRN Irine Seal, MD       Or  . diphenhydrAMINE (BENADRYL) 12.5 MG/5ML elixir 12.5-25 mg  12.5-25 mg Oral Q6H PRN Irine Seal, MD      . docusate sodium (COLACE) capsule 100 mg  100 mg Oral BID Irine Seal, MD   100 mg at 06/04/13 2214  . HYDROmorphone (DILAUDID) injection 2 mg  2 mg Intravenous Q2H PRN Irine Seal, MD   2 mg at 06/05/13 0525  . hyoscyamine (LEVSIN SL) SL tablet 0.125 mg  0.125 mg Oral Q4H PRN Irine Seal, MD   0.125 mg at 06/05/13 0525  . ipratropium-albuterol (DUONEB) 0.5-2.5 (3) MG/3ML nebulizer solution 3 mL  3 mL Nebulization Q2H PRN Robbie Lis, MD   3 mL at 06/01/13 1310  . levothyroxine (SYNTHROID, LEVOTHROID) tablet 175 mcg  175 mcg Oral QAC breakfast Irine Seal, MD   175 mcg at 06/05/13 0831  . lisinopril (PRINIVIL,ZESTRIL) tablet 40 mg  40 mg Oral q morning - 10a Irine Seal, MD   40 mg at 06/04/13 0943  . LORazepam (ATIVAN) tablet 1 mg  1 mg Oral Q6H PRN Irine Seal, MD   1 mg at 06/05/13 0525   Or  . LORazepam (ATIVAN) injection 1 mg  1 mg Intramuscular Q4H PRN Irine Seal, MD      . metFORMIN (GLUCOPHAGE) tablet 500 mg  500 mg Oral Q supper Irine Seal, MD   500 mg at 06/03/13 1754  . ondansetron (ZOFRAN) injection 4 mg  4 mg Intravenous Q4H PRN Irine Seal, MD   4 mg at 06/03/13 0208  . oxyCODONE (Oxy IR/ROXICODONE) immediate release tablet 15 mg  15 mg Oral QID Irine Seal, MD   15 mg at 06/04/13 2215  . oxyCODONE (Oxy IR/ROXICODONE) immediate release tablet 5 mg  5 mg Oral Q4H PRN Irine Seal, MD   5 mg at 06/04/13 0443  . patient's guide to using coumadin book   Does not apply Once Posey Rea, [facility]      . predniSONE (DELTASONE) tablet 20 mg  20 mg Oral Q breakfast Theodis Blaze, MD   20 mg at 06/05/13 0831    Renal U/S, 06/03/13: IMPRESSION:  No perinephric fluid collections identified to suggest hematoma or  urinoma status post nephrolithotomy.  Persistent dilatation of the upper pole caliectasis of the right   kidney and with associated cortical volume loss from the upper pole  likely due to chronic obstruction.  Normal appearing left kidney.   Assessment: s/p Procedure(s): NEPHROLITHOTOMY PERCUTANEOUS PE - hgb stable, would try gentle anticoagulation No fevers and no sign of obstruction from procedure.   Plan: Would recommend slowly starting heparin gtt and monitoring for evidence of rebleed Ativan for her anxiety.  Liberate activity restrictions Appreciate help by  hospitalist.     LOS: 5 days    Ardis Hughs 06/05/2013

## 2013-06-05 NOTE — Progress Notes (Signed)
Attempted x 2 this am without success to restart outdated IV's . She presently has 2-NSL's-Bilateral hands. Per nsg judgement redressed site on RH, & flushes well, so making this site a PRN site.Nite shift IVT notified. The pt had a really good nite tonite after the visit with her dog, she slept very well, & only had one dose of Dilaudid IV @22 :15pm, until 05:25am this am for pain issues. The pt has been in a good mood 7p-7am shift.

## 2013-06-05 NOTE — Progress Notes (Signed)
Patient ID: Tricia Perry, female   DOB: February 03, 1956, 58 y.o.   MRN: [id number]  [facility] PROGRESS NOTE  CHELAN HERINGER QPY:[id number] DOB: [birth year] DOA: 05/31/2013 PCP: Tammi Sou, MD  Brief narrative:  58 year old female with past medical history of asthma, hypertension, diabetes, kidney stone who presented to [facility] 05/31/13 for evaluation 2.3 cm stone found on CT scan. She subsequently underwent right percutaneous nephrolithotomy 05/31/2013. She was doing relatively ok post procedure but has developed sudden shortness of breath this morning for which reason [facility] consulted.   Assessment and Plan:  Principal Problem:  Acute respiratory distress  - secondary to to PE as confirmed by CT angio chest  - due to hematuria, heparin held and plan to resume today  - continue duoneb every 2 hours PRN  - continue Prednisone taper and discontinue in AM - continue oxygen support via nasal canula to keep O2 saturation above 90%  Active Problems:  Hematuria  - persistent but appears to be improving  - renal US with no acute abnormalities that could explain etiology  - start anticoagulation with heparin today  Right renal stone  - status post right perc nephrolithotomy  - manage per GU  Hypertension  - continue lisinopril  Diabetes mellitus, type II  - continue metformin  Leukocytosis  - secondary to steroids, WBC trending down as we are tapering Prednisone dose down   Procedures/Studies:  Abd 1 View 06/01/2013 No visualize retained stone fragments.  Ct Angio Chest Pe W/cm &/or Wo Cm 06/01/2013 Pulmonary embolus within the right middle lobe pulmonary artery with segmental extension. The RV LV ratio is within normal limits. Interstitial infiltrate likely reflecting component of pulmonary edema an infectious or inflammatory abnormality cannot be excluded.  Dg Chest Port 1 View 06/01/2013 Subsegmental bibasilar atelectasis in a low volume chest.  Antibiotics:  None  Code Status: Full  Family  Communication: Pt and husband at bedside  Disposition Plan: Home when medically stable   HPI/Subjective: No events overnight.   Objective: Filed Vitals:   06/04/13 0621 06/04/13 1623 06/04/13 2215 06/05/13 0528  BP: 121/47 144/79 100/56 112/52  Pulse: 85 100 112 103  Temp: 98.3 F (36.8 C) 98.3 F (36.8 C) 98.2 F (36.8 C) 98.1 F (36.7 C)  TempSrc: Oral Oral Oral Oral  Resp: 18 20 20 18   Height:      Weight:      SpO2: 96% 95% 95% 93%    Intake/Output Summary (Last 24 hours) at 06/05/13 1254 Last data filed at 06/05/13 0600  Gross per 24 hour  Intake    600 ml  Output    850 ml  Net   -250 ml    Exam:   General:  Pt is alert, follows commands appropriately, not in acute distress  Cardiovascular: Regular rate and rhythm, S1/S2, no murmurs, no rubs, no gallops  Respiratory: Clear to auscultation bilaterally, no wheezing, no crackles, no rhonchi  Abdomen: Soft, slightly tender in right upper quadrant area, non distended, bowel sounds present, no guarding  Extremities: No edema, pulses DP and PT palpable bilaterally  Neuro: Grossly nonfocal  Data Reviewed: Basic Metabolic Panel:  Recent Labs Lab 05/31/13 0800 06/03/13 0411 06/04/13 0455 06/05/13 0522  NA 138 135* 135* 135*  K 4.3 4.3 4.2 4.3  CL 100 97 95* 95*  CO2 26 27 32 30  GLUCOSE 117* 155* 110* 115*  BUN 17 17 23 23   CREATININE 0.66 0.92 1.10 1.04  CALCIUM 9.6 9.3 9.5  9.1   CBC:  Recent Labs Lab 05/31/13 0800  06/01/13 0454 06/02/13 1135 06/03/13 0411 06/04/13 0455 06/05/13 0522  WBC 6.2  --   --  14.8* 12.9* 13.0* 11.5*  HGB 12.5  < > 11.9* 12.5 11.0* 11.3* 11.2*  HCT 37.8  < > 37.7 38.4 34.5* 35.4* 34.6*  MCV 90.4  --   --  91.2 91.0 91.5 91.1  PLT 294  --   --  258 257 274 287  < > = values in this interval not displayed.  CBG:  Recent Labs Lab 05/31/13 0811 05/31/13 1351  GLUCAP 113* 88   Scheduled Meds: . docusate sodium  100 mg Oral BID  . levothyroxine  175 mcg Oral  QAC breakfast  . lisinopril  40 mg Oral q morning - 10a  . metFORMIN  500 mg Oral Q supper  . oxyCODONE  15 mg Oral QID  . patient's guide to using coumadin book   Does not apply Once  . predniSONE  20 mg Oral Q breakfast   Continuous Infusions:  Faye Ramsay, MD  [facility] Pager [phone]  If 7PM-7AM, please contact night-coverage [email or web address] Password [email or web address] 06/05/2013, 12:54 PM   LOS: 5 days

## 2013-06-06 LAB — HEPARIN LEVEL (UNFRACTIONATED)
HEPARIN UNFRACTIONATED: 0.41 [IU]/mL (ref 0.30–0.70)
HEPARIN UNFRACTIONATED: 0.46 [IU]/mL (ref 0.30–0.70)
Heparin Unfractionated: 0.19 IU/mL — ABNORMAL LOW (ref 0.30–0.70)

## 2013-06-06 LAB — BASIC METABOLIC PANEL
BUN: 24 mg/dL — ABNORMAL HIGH (ref 6–23)
CO2: 28 mEq/L (ref 19–32)
Calcium: 8.8 mg/dL (ref 8.4–10.5)
Chloride: 97 mEq/L (ref 96–112)
Creatinine, Ser: 0.77 mg/dL (ref 0.50–1.10)
GFR calc Af Amer: >90 mL/min (ref >90)
GLUCOSE: 106 mg/dL — AB (ref 70–99)
Potassium: 4.4 mEq/L (ref 3.7–5.3)
SODIUM: 138 meq/L (ref 137–147)

## 2013-06-06 LAB — CBC
HCT: 34.2 % — ABNORMAL LOW (ref 36.0–46.0)
HEMOGLOBIN: 11.1 g/dL — AB (ref 12.0–15.0)
MCH: 29.8 pg (ref 26.0–34.0)
MCHC: 32.5 g/dL (ref 30.0–36.0)
MCV: 91.7 fL (ref 78.0–100.0)
Platelets: 258 10*3/uL (ref 150–400)
RBC: 3.73 MIL/uL — AB (ref 3.87–5.11)
RDW: 14.3 % (ref 11.5–15.5)
WBC: 9.6 10*3/uL (ref 4.0–10.5)

## 2013-06-06 LAB — PROTIME-INR
INR: 0.91 (ref 0.00–1.49)
PROTHROMBIN TIME: 12.1 s (ref 11.6–15.2)

## 2013-06-06 MED ORDER — HEPARIN (PORCINE) IN NACL 100-0.45 UNIT/ML-% IJ SOLN
1950.0000 [IU]/h | INTRAMUSCULAR | Status: DC
  Administered 2013-06-06: 1950 [IU]/h via INTRAVENOUS
  Filled 2013-06-06 (×2): qty 250

## 2013-06-06 NOTE — Progress Notes (Signed)
ANTICOAGULATION CONSULT NOTE - Follow Up Consult  Pharmacy Consult for Heparin Indication: pulmonary embolus  Allergies  Allergen Reactions  . Codeine Itching    Back hurts  . Mold Extract [Trichophyton] Nausea And Vomiting and Other (See Comments)    Sneezing real bad   . Morphine Itching    back hurts    Patient Measurements: Height: 5\' 4"  (162.6 cm) Weight: 305 lb (138.347 kg) IBW/kg (Calculated) : 54.7 Heparin Dosing Weight: 90 kg  Vital Signs: Temp: 98.4 F (36.9 C) (03/09 0535) Temp src: Oral (03/09 0535) BP: 101/51 mmHg (03/09 0535) Pulse Rate: 79 (03/09 0535)  Labs:  Recent Labs  06/04/13 0455 06/05/13 0522 06/06/13 0036 06/06/13 0800  HGB 11.3* 11.2* 11.1*  --   HCT 35.4* 34.6* 34.2*  --   PLT 274 287 258  --   LABPROT 13.3 12.5 12.1  --   INR 1.03 0.95 0.91  --   HEPARINUNFRC  --   --  0.19* 0.41  CREATININE 1.10 1.04 0.77  --     Estimated Creatinine Clearance: 106.6 ml/min (by C-G formula based on Cr of 0.77).   Medications:  Scheduled:  . docusate sodium  100 mg Oral BID  . levothyroxine  175 mcg Oral QAC breakfast  . lisinopril  40 mg Oral q morning - 10a  . metFORMIN  500 mg Oral Q supper  . oxyCODONE  15 mg Oral QID  . patient's guide to using coumadin book   Does not apply Once  . predniSONE  10 mg Oral Q breakfast   Infusions:  . sodium chloride 20 mL/hr at 06/05/13 2316  . heparin 1,950 Units/hr (06/06/13 0535)    Assessment: 33 yoF s/p right perc nephrolithotomy on 3/3 and developed SOB on 3/4.  CT angio confirm acute right middle lobe PE. Started on anticoagulation with heparin/warfarin, then changed to lovenox/warfarin on 3/5. All stopped on 3/6 when patient developed hematuria. Renal US showed no evidence of per-nephric hematoma. Hgb has remained stable and hematuria improving. Per Urology, ok to slowly resume heparin 3/8 at the lowest acceptable dose as she is still having some hematuria. If the hematuria doesn't resolve, urology  will hold anticoagulation pending clear urine.  Warfarin remains on hold for now.  Heparin level 0.41 with heparin infusion at 1950 units/hr.  Hgb 11.1, platelets 258K  Per urology, patient still having some hematuria this morning  Goal of Therapy:  Heparin levels 0.3-0.7 (currenlty aiming for 0.3-0.5 given current hematuria) Monitor platelets by anticoagulation protocol: Yes   Plan:   Continue heparin at 1950 units/hr.  Recheck heparin level in 6 hours.  Daily heparin level and CBC  F/u hematuria and plans for anticoagulation.  Hershal Coria, PharmD, BCPS Pager: [phone] 06/06/2013 8:40 AM

## 2013-06-06 NOTE — Progress Notes (Signed)
ANTICOAGULATION CONSULT NOTE - Follow Up Consult  Pharmacy Consult for Heparin Indication: pulmonary embolus  Allergies  Allergen Reactions  . Codeine Itching    Back hurts  . Mold Extract [Trichophyton] Nausea And Vomiting and Other (See Comments)    Sneezing real bad   . Morphine Itching    back hurts    Patient Measurements: Height: 5\' 4"  (162.6 cm) Weight: 305 lb (138.347 kg) IBW/kg (Calculated) : 54.7 Heparin Dosing Weight:   Vital Signs: Temp: 98.5 F (36.9 C) (03/08 2034) Temp src: Oral (03/08 2034) BP: 116/53 mmHg (03/08 2034) Pulse Rate: 105 (03/08 2034)  Labs:  Recent Labs  06/04/13 0455 06/05/13 0522 06/06/13 0036  HGB 11.3* 11.2* 11.1*  HCT 35.4* 34.6* 34.2*  PLT 274 287 258  LABPROT 13.3 12.5 12.1  INR 1.03 0.95 0.91  HEPARINUNFRC  --   --  0.19*  CREATININE 1.10 1.04 0.77    Estimated Creatinine Clearance: 106.6 ml/min (by C-G formula based on Cr of 0.77).   Medications:  Infusions:  . sodium chloride 20 mL/hr at 06/05/13 2316  . heparin 1,950 Units/hr (06/06/13 0152)    Assessment: Patient with heparin level low.  No issues per RN.  Goal of Therapy:  Heparin level 0.3-0.7 units/ml Monitor platelets by anticoagulation protocol: Yes   Plan:  Increase heparin to 1950 units/hr Recheck level at [place] 06/06/2013,1:54 AM

## 2013-06-06 NOTE — Progress Notes (Signed)
Patient voided, but had both urine and stool in collection hat and staff was unable to separate the two to place urine sample in string of bottles.  Will continue to monitor patient and attempt to collect next time patient voids.

## 2013-06-06 NOTE — Progress Notes (Signed)
Patient's urine has gotten progressively bloodier, from yellow/straw colored with rust-colored sediment to now appearing light red.  Have informed Dr. Jeffie Pollock and received orders to hold heparin drip.  Will d/c heparin and continue to monitor patient's urine.

## 2013-06-06 NOTE — Progress Notes (Addendum)
Patient's husband stated he took her to the commode in order for her to void and he poured sample into first bottle of string of bottles.  Patient's remaining urine in commode was mixed with toilet paper.  Have charted 2 occurrences total for today and documented that urine from the second occurrence was yellow with bloody sediment.  Have paged Dr. Jeffie Pollock, and he stated that unless it looks like frank blood or has clots to allow the heparin to run overnight.  Have informed pharmacy and Dr. Doyle Askew of bleeding as well.  Will continue to monitor.

## 2013-06-06 NOTE — Progress Notes (Signed)
ANTICOAGULATION CONSULT NOTE - Follow Up Consult  Pharmacy Consult for Heparin Indication: pulmonary embolus  Allergies  Allergen Reactions  . Codeine Itching    Back hurts  . Mold Extract [Trichophyton] Nausea And Vomiting and Other (See Comments)    Sneezing real bad   . Morphine Itching    back hurts    Patient Measurements: Height: 5\' 4"  (162.6 cm) Weight: 305 lb (138.347 kg) IBW/kg (Calculated) : 54.7 Heparin Dosing Weight: 90 kg  Vital Signs: Temp: 98.5 F (36.9 C) (03/09 1338) Temp src: Oral (03/09 1338) BP: 109/59 mmHg (03/09 1338) Pulse Rate: 105 (03/09 1338)  Labs:  Recent Labs  06/04/13 0455 06/05/13 0522 06/06/13 0036 06/06/13 0800 06/06/13 1423  HGB 11.3* 11.2* 11.1*  --   --   HCT 35.4* 34.6* 34.2*  --   --   PLT 274 287 258  --   --   LABPROT 13.3 12.5 12.1  --   --   INR 1.03 0.95 0.91  --   --   HEPARINUNFRC  --   --  0.19* 0.41 0.46  CREATININE 1.10 1.04 0.77  --   --     Estimated Creatinine Clearance: 106.6 ml/min (by C-G formula based on Cr of 0.77).   Medications:  Scheduled:  . docusate sodium  100 mg Oral BID  . levothyroxine  175 mcg Oral QAC breakfast  . lisinopril  40 mg Oral q morning - 10a  . metFORMIN  500 mg Oral Q supper  . oxyCODONE  15 mg Oral QID  . patient's guide to using coumadin book   Does not apply Once  . predniSONE  10 mg Oral Q breakfast   Infusions:  . sodium chloride 20 mL/hr at 06/05/13 2316  . heparin 1,950 Units/hr (06/06/13 0535)    Assessment: 79 yoF s/p right perc nephrolithotomy on 3/3 and developed SOB on 3/4.  CT angio confirm acute right middle lobe PE. Started on anticoagulation with heparin/warfarin, then changed to lovenox/warfarin on 3/5. All stopped on 3/6 when patient developed hematuria. Renal US showed no evidence of per-nephric hematoma. Hgb has remained stable and hematuria improving. Per Urology, ok to slowly resume heparin 3/8 at the lowest acceptable dose as she is still having some  hematuria. If the hematuria doesn't resolve, urology will hold anticoagulation pending clear urine.  Warfarin remains on hold for now.   RN reports pt has continued hematuria this afternoon and is contacting Urology for recommendations.    Confirmation heparin level is 0.46 - therapeutic at 1950 units/hr.  CBC stable.   Hematuria noted above.  Waiting for word from RN or urology for recommendations.    Goal of Therapy:  Heparin levels 0.3-0.7 (currenlty aiming for 0.3-0.5 given current hematuria) Monitor platelets by anticoagulation protocol: Yes   Plan:   Continue heparin at 1950 units/hr.  Daily heparin level and CBC  F/u hematuria and plans for anticoagulation.  Ralene Bathe, PharmD, BCPS 06/06/2013, 2:58 PM  Pager: [phone]

## 2013-06-06 NOTE — Progress Notes (Signed)
Patient ID: Tricia Perry, female   DOB: [birth year], 58 y.o.   MRN: [id number]  [facility] PROGRESS NOTE  Tricia Perry TDD:[id number] DOB: 10-16-1955 DOA: 05/31/2013 PCP: Tammi Sou, MD  Brief narrative:  58 year old female with past medical history of asthma, hypertension, diabetes, kidney stone who presented to [facility] 05/31/13 for evaluation 2.3 cm stone found on CT scan. She subsequently underwent right percutaneous nephrolithotomy 05/31/2013. She was doing relatively ok post procedure but has developed sudden shortness of breath this morning for which reason [facility] consulted.   Assessment and Plan:  Principal Problem:  Acute respiratory distress  - secondary to to PE as confirmed by CT angio chest  - due to hematuria, heparin held second day into anticoagulation, heparin resumed 3/8 - continue duoneb every 2 hours PRN  - continue Prednisone taper and discontinue in AM  - continue oxygen support via nasal canula to keep O2 saturation above 90%  - incentive spirometry added  Active Problems:  Hematuria  - persistent but appears to be improving  - renal US with no acute abnormalities that could explain etiology  - restarted anticoagulation 3/8 Right renal stone  - status post right perc nephrolithotomy  - manage per GU  Hypertension  - continue lisinopril  Diabetes mellitus, type II  - continue metformin  Leukocytosis  - secondary to steroids, WBC trending down as we are tapering Prednisone dose down  - WBC is WNL this AM  Procedures/Studies:  Abd 1 View 06/01/2013 No visualize retained stone fragments.  Ct Angio Chest Pe W/cm &/or Wo Cm 06/01/2013 Pulmonary embolus within the right middle lobe pulmonary artery with segmental extension. The RV LV ratio is within normal limits. Interstitial infiltrate likely reflecting component of pulmonary edema an infectious or inflammatory abnormality cannot be excluded.  Dg Chest Port 1 View 06/01/2013 Subsegmental bibasilar atelectasis in a low  volume chest.  Antibiotics:  None  Code Status: Full  Family Communication: Pt and husband at bedside  Disposition Plan: Home when medically stable,per primary team    HPI/Subjective: No events overnight.   Objective: Filed Vitals:   06/05/13 1516 06/05/13 1700 06/05/13 2034 06/06/13 0535  BP: 97/56  116/53 101/51  Pulse: 103  105 79  Temp: 97.6 F (36.4 C)  98.5 F (36.9 C) 98.4 F (36.9 C)  TempSrc: Tympanic  Oral Oral  Resp: 18  20 16   Height:      Weight:      SpO2: 96% 99% 98% 98%    Intake/Output Summary (Last 24 hours) at 06/06/13 1218 Last data filed at 06/06/13 0700  Gross per 24 hour  Intake   1237 ml  Output    450 ml  Net    787 ml    Exam:   General:  Pt is alert, follows commands appropriately, not in acute distress  Cardiovascular: Regular rate and rhythm, S1/S2, no murmurs, no rubs, no gallops  Respiratory: Clear to auscultation bilaterally, no wheezing, diminished breath sounds at bases   Abdomen: Soft, non tender, non distended, bowel sounds present, no guarding  Extremities: No edema, pulses DP and PT palpable bilaterally  Neuro: Grossly nonfocal  Data Reviewed: Basic Metabolic Panel:  Recent Labs Lab 05/31/13 0800 06/03/13 0411 06/04/13 0455 06/05/13 0522 06/06/13 0036  NA 138 135* 135* 135* 138  K 4.3 4.3 4.2 4.3 4.4  CL 100 97 95* 95* 97  CO2 26 27 32 30 28  GLUCOSE 117* 155* 110* 115* 106*  BUN 17  17 23 23  24*  CREATININE 0.66 0.92 1.10 1.04 0.77  CALCIUM 9.6 9.3 9.5 9.1 8.8   CBC:  Recent Labs Lab 06/02/13 1135 06/03/13 0411 06/04/13 0455 06/05/13 0522 06/06/13 0036  WBC 14.8* 12.9* 13.0* 11.5* 9.6  HGB 12.5 11.0* 11.3* 11.2* 11.1*  HCT 38.4 34.5* 35.4* 34.6* 34.2*  MCV 91.2 91.0 91.5 91.1 91.7  PLT 258 257 274 287 258   CBG:  Recent Labs Lab 05/31/13 0811 05/31/13 1351  GLUCAP 113* 88    No results found for this or any previous visit (from the past 240 hour(s)).   Scheduled Meds: . docusate  sodium  100 mg Oral BID  . levothyroxine  175 mcg Oral QAC breakfast  . lisinopril  40 mg Oral q morning - 10a  . metFORMIN  500 mg Oral Q supper  . oxyCODONE  15 mg Oral QID  . patient's guide to using coumadin book   Does not apply Once  . predniSONE  10 mg Oral Q breakfast   Continuous Infusions: . sodium chloride 20 mL/hr at 06/05/13 2316  . heparin 1,950 Units/hr (06/06/13 0535)   Faye Ramsay, MD  [facility] Pager [phone]  If 7PM-7AM, please contact night-coverage [email or web address] Password [email or web address] 06/06/2013, 12:18 PM   LOS: 6 days

## 2013-06-06 NOTE — Progress Notes (Signed)
Patient ID: SAMAN GIDDENS, female   DOB: [birth year], 58 y.o.   MRN: [id number] 6 Days Post-Op  Subjective: 1) S/P right PCNL now stone free.   She has no more pain but she continues have slight hematuria on heparin drip.   She has no more colic and her [place] on Friday showed no hematoma or obstruction.   2) Pulmonary Embolism.  Anticoagulation has been problematic because of hematuria.   She is now on a heparin drip and the urine remains slightly bloody with rust colored urine.   She has improvement in her shortness of breath but reports some wheezing. ROS: Negative except as above.   She has no further pain in the flank.   She denies nausea and vomiting.   Objective: Vital signs in last 24 hours: Temp:  [97.6 F (36.4 C)-98.5 F (36.9 C)] 98.4 F (36.9 C) (03/09 0535) Pulse Rate:  [79-105] 79 (03/09 0535) Resp:  [16-20] 16 (03/09 0535) BP: (97-116)/(51-56) 101/51 mmHg (03/09 0535) SpO2:  [96 %-99 %] 98 % (03/09 0535)  Intake/Output from previous day: 03/08 0701 - 03/09 0700 In: 933.5 [P.O.:840; I.V.:93.5] Out: 450 [Urine:450] Intake/Output this shift:    General appearance: alert, no distress and morbidly obese Resp: clear to auscultation bilaterally Cardio: regular rate and rhythm GI: soft, obese, NT.   Urine is pink in the bedside commode.   Lab Results:   Recent Labs  06/05/13 0522 06/06/13 0036  WBC 11.5* 9.6  HGB 11.2* 11.1*  HCT 34.6* 34.2*  PLT 287 258   BMET  Recent Labs  06/05/13 0522 06/06/13 0036  NA 135* 138  K 4.3 4.4  CL 95* 97  CO2 30 28  GLUCOSE 115* 106*  BUN 23 24*  CREATININE 1.04 0.77  CALCIUM 9.1 8.8   PT/INR  Recent Labs  06/05/13 0522 06/06/13 0036  LABPROT 12.5 12.1  INR 0.95 0.91   ABG No results found for this basename: PHART, PCO2, PO2, HCO3,  in the last 72 hours  Studies/Results: No results found.  Anti-infectives: Anti-infectives   Start     Dose/Rate Route Frequency Ordered Stop   05/31/13 2000  ciprofloxacin  (CIPRO) tablet 500 mg  Status:  Discontinued     500 mg Oral 2 times daily 05/31/13 1442 06/02/13 1330   05/31/13 0730  ciprofloxacin (CIPRO) IVPB 400 mg  Status:  Discontinued     400 mg 200 mL/hr over 60 Minutes Intravenous On call 05/31/13 0724 05/31/13 0730   05/31/13 0724  ciprofloxacin (CIPRO) IVPB 400 mg  Status:  Discontinued     400 mg 200 mL/hr over 60 Minutes Intravenous 60 min pre-op 05/31/13 0724 05/31/13 1442      Current Facility-Administered Medications  Medication Dose Route Frequency Provider Last Rate Last Dose  . 0.9 %  sodium chloride infusion   Intravenous Continuous Irine Seal, MD 20 mL/hr at 06/05/13 2316    . acetaminophen (TYLENOL) tablet 650 mg  650 mg Oral Q4H PRN Irine Seal, MD   650 mg at 06/04/13 0443  . bisacodyl (DULCOLAX) suppository 10 mg  10 mg Rectal Daily PRN Irine Seal, MD   10 mg at 06/02/13 2049  . diphenhydrAMINE (BENADRYL) injection 12.5-25 mg  12.5-25 mg Intravenous Q6H PRN Irine Seal, MD       Or  . diphenhydrAMINE (BENADRYL) 12.5 MG/5ML elixir 12.5-25 mg  12.5-25 mg Oral Q6H PRN Irine Seal, MD   12.5 mg at 06/05/13 2200  . docusate sodium (COLACE) capsule 100  mg  100 mg Oral BID Irine Seal, MD   100 mg at 06/05/13 2200  . heparin ADULT infusion 100 units/mL (25000 units/250 mL)  1,950 Units/hr Intravenous Continuous Irine Seal, MD 19.5 mL/hr at 06/06/13 0535 1,950 Units/hr at 06/06/13 0535  . HYDROmorphone (DILAUDID) injection 2 mg  2 mg Intravenous Q2H PRN Irine Seal, MD   2 mg at 06/05/13 2200  . hyoscyamine (LEVSIN SL) SL tablet 0.125 mg  0.125 mg Oral Q4H PRN Irine Seal, MD   0.125 mg at 06/06/13 0535  . ipratropium-albuterol (DUONEB) 0.5-2.5 (3) MG/3ML nebulizer solution 3 mL  3 mL Nebulization Q2H PRN Robbie Lis, MD   3 mL at 06/01/13 1310  . levothyroxine (SYNTHROID, LEVOTHROID) tablet 175 mcg  175 mcg Oral QAC breakfast Irine Seal, MD   175 mcg at 06/05/13 0831  . lisinopril (PRINIVIL,ZESTRIL) tablet 40 mg  40 mg Oral q morning - 10a  Irine Seal, MD   40 mg at 06/05/13 1022  . LORazepam (ATIVAN) tablet 1 mg  1 mg Oral Q6H PRN Irine Seal, MD   1 mg at 06/06/13 0535   Or  . LORazepam (ATIVAN) injection 1 mg  1 mg Intramuscular Q4H PRN Irine Seal, MD      . metFORMIN (GLUCOPHAGE) tablet 500 mg  500 mg Oral Q supper Irine Seal, MD   500 mg at 06/05/13 1755  . ondansetron (ZOFRAN) injection 4 mg  4 mg Intravenous Q4H PRN Irine Seal, MD   4 mg at 06/03/13 0208  . oxyCODONE (Oxy IR/ROXICODONE) immediate release tablet 15 mg  15 mg Oral QID Irine Seal, MD   15 mg at 06/05/13 2200  . oxyCODONE (Oxy IR/ROXICODONE) immediate release tablet 5 mg  5 mg Oral Q4H PRN Irine Seal, MD   5 mg at 06/06/13 0535  . patient's guide to using coumadin book   Does not apply Once Posey Rea, [facility]      . predniSONE (DELTASONE) tablet 10 mg  10 mg Oral Q breakfast Theodis Blaze, MD        Assessment: s/p Procedure(s): NEPHROLITHOTOMY PERCUTANEOUS  She has no flank pain but still has some hematuria on the heparin drip. Her breathing is better.   Plan: I am going to have the nurses keep a string of bottles to better assess the degree of hematuria.  I would continue the heparin at the lowest dose that is acceptable but if the bleeding resumes we will have to hold it again. The bleeding is not so severe that we need to consult IR for embolization. Jocelynne is anxious to go home but until we get her anticoagulation sorted out, I don't think that is possible.     LOS: 6 days    Gabrien Mentink J 06/06/2013

## 2013-06-07 LAB — CBC
HCT: 33.5 % — ABNORMAL LOW (ref 36.0–46.0)
Hemoglobin: 11.1 g/dL — ABNORMAL LOW (ref 12.0–15.0)
MCH: 29.8 pg (ref 26.0–34.0)
MCHC: 33.1 g/dL (ref 30.0–36.0)
MCV: 89.8 fL (ref 78.0–100.0)
PLATELETS: 314 10*3/uL (ref 150–400)
RBC: 3.73 MIL/uL — ABNORMAL LOW (ref 3.87–5.11)
RDW: 14.2 % (ref 11.5–15.5)
WBC: 9.5 10*3/uL (ref 4.0–10.5)

## 2013-06-07 LAB — PROTIME-INR
INR: 0.88 (ref 0.00–1.49)
Prothrombin Time: 11.8 seconds (ref 11.6–15.2)

## 2013-06-07 LAB — HEPARIN LEVEL (UNFRACTIONATED): HEPARIN UNFRACTIONATED: 0.19 [IU]/mL — AB (ref 0.30–0.70)

## 2013-06-07 MED ORDER — VANCOMYCIN HCL 10 G IV SOLR
2500.0000 mg | Freq: Once | INTRAVENOUS | Status: AC
  Administered 2013-06-07: 2500 mg via INTRAVENOUS
  Filled 2013-06-07: qty 2500

## 2013-06-07 MED ORDER — HEPARIN (PORCINE) IN NACL 100-0.45 UNIT/ML-% IJ SOLN
2150.0000 [IU]/h | INTRAMUSCULAR | Status: DC
  Administered 2013-06-07 – 2013-06-08 (×3): 2150 [IU]/h via INTRAVENOUS
  Filled 2013-06-07 (×4): qty 250

## 2013-06-07 MED ORDER — HEPARIN (PORCINE) IN NACL 100-0.45 UNIT/ML-% IJ SOLN
1950.0000 [IU]/h | INTRAMUSCULAR | Status: DC
  Administered 2013-06-07: 1950 [IU]/h via INTRAVENOUS
  Filled 2013-06-07 (×3): qty 250

## 2013-06-07 MED ORDER — SODIUM CHLORIDE 0.9 % IV SOLN
1250.0000 mg | Freq: Two times a day (BID) | INTRAVENOUS | Status: DC
  Administered 2013-06-08 – 2013-06-11 (×6): 1250 mg via INTRAVENOUS
  Filled 2013-06-07 (×8): qty 1250

## 2013-06-07 NOTE — Progress Notes (Signed)
ANTICOAGULATION CONSULT NOTE - Follow Up Consult  Pharmacy Consult for Heparin Indication: pulmonary embolus  Allergies  Allergen Reactions  . Codeine Itching    Back hurts  . Mold Extract [Trichophyton] Nausea And Vomiting and Other (See Comments)    Sneezing real bad   . Morphine Itching    back hurts    Patient Measurements: Height: 5\' 4"  (162.6 cm) Weight: 305 lb (138.347 kg) IBW/kg (Calculated) : 54.7 Heparin Dosing Weight: 90 kg  Vital Signs: Temp: 98.2 F (36.8 C) (03/10 1349) Temp src: Oral (03/10 1349) BP: 136/75 mmHg (03/10 1349) Pulse Rate: 96 (03/10 1349)  Labs:  Recent Labs  06/05/13 0522  06/06/13 0036 06/06/13 0800 06/06/13 1423 06/07/13 0428 06/07/13 1811  HGB 11.2*  --  11.1*  --   --  11.1*  --   HCT 34.6*  --  34.2*  --   --  33.5*  --   PLT 287  --  258  --   --  314  --   LABPROT 12.5  --  12.1  --   --  11.8  --   INR 0.95  --  0.91  --   --  0.88  --   HEPARINUNFRC  --   < > 0.19* 0.41 0.46  --  0.19*  CREATININE 1.04  --  0.77  --   --   --   --   < > = values in this interval not displayed.  Estimated Creatinine Clearance: 106.6 ml/min (by C-G formula based on Cr of 0.77).   Medications:  Scheduled:  . docusate sodium  100 mg Oral BID  . levothyroxine  175 mcg Oral QAC breakfast  . lisinopril  40 mg Oral q morning - 10a  . metFORMIN  500 mg Oral Q supper  . oxyCODONE  15 mg Oral QID  . patient's guide to using coumadin book   Does not apply Once  . [START ON 06/08/2013] vancomycin  1,250 mg Intravenous Q12H   Infusions:  . sodium chloride 20 mL/hr at 06/07/13 0753  . heparin 1,950 Units/hr (06/07/13 1018)    Assessment: 42 yoF s/p right perc nephrolithotomy on 3/3 and developed SOB on 3/4.  CT angio confirm acute right middle lobe PE. Started on anticoagulation with heparin/warfarin, then changed to lovenox/warfarin on 3/5. All stopped on 3/6 when patient developed hematuria. Renal US showed no evidence of per-nephric hematoma,  Hgb remained stable and hematuria was improving so urology ordered to slowly resume heparin 3/8 at the lowest acceptable dose as she is still having some hematuria. Patient experienced more hematuria on heparin 3/8 PM so heparin was held.  This AM, urology notes that bleeding has stopped and reorded the heparin to resume as the risk of thombosis outweighs the risk of hematuria at this time.  Warfarin remains on hold.  Hgb 11.1 (stable), platelets WNL  Heparin level = 0.19 with heparin infusing @ 1950 units/hr  No further bleeding noted today  Goal of Therapy:  Heparin levels 0.3-0.7 (currenlty aiming for 0.3-0.5 given current hematuria) Monitor platelets by anticoagulation protocol: Yes   Plan:   Increase heparin to 2150 units/hr.  Check heparin level in 6 hours after rate change  Daily heparin level and CBC  F/u hematuria.  Leone Haven, PharmD  06/07/2013 9:03 PM

## 2013-06-07 NOTE — Progress Notes (Signed)
ANTIBIOTIC CONSULT NOTE - INITIAL  Pharmacy Consult for Vancomycin Indication: Cellulitis  Allergies  Allergen Reactions  . Codeine Itching    Back hurts  . Mold Extract [Trichophyton] Nausea And Vomiting and Other (See Comments)    Sneezing real bad   . Morphine Itching    back hurts    Patient Measurements: Height: 5\' 4"  (162.6 cm) Weight: 305 lb (138.347 kg) IBW/kg (Calculated) : 54.7  Vital Signs: Temp: 98.2 F (36.8 C) (03/10 1349) Temp src: Oral (03/10 1349) BP: 136/75 mmHg (03/10 1349) Pulse Rate: 96 (03/10 1349) Intake/Output from previous day: 03/09 0701 - 03/10 0700 In: 1242.4 [P.O.:720; I.V.:522.4] Out: 1500 [Urine:1500] Intake/Output from this shift: Total I/O In: 480 [P.O.:480] Out: 800 [Urine:800]  Labs:  Recent Labs  06/05/13 0522 06/06/13 0036 06/07/13 0428  WBC 11.5* 9.6 9.5  HGB 11.2* 11.1* 11.1*  PLT 287 258 314  CREATININE 1.04 0.77  --    Estimated Creatinine Clearance: 106.6 ml/min (by C-G formula based on Cr of 0.77). No results found for this basename: VANCOTROUGH, VANCOPEAK, VANCORANDOM, GENTTROUGH, GENTPEAK, GENTRANDOM, TOBRATROUGH, TOBRAPEAK, TOBRARND, AMIKACINPEAK, AMIKACINTROU, AMIKACIN,  in the last 72 hours   Microbiology: No results found for this or any previous visit (from the past 720 hour(s)).  Medical History: Past Medical History  Diagnosis Date  . Insulin resistance   . Hypertension   . Hypothyroidism   . 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 DDD (degenerative disc disease)     Dr. [name] evaluated her and recommended pain mgmt MD.  She saw Dr. Selinda Orion for pain mgmt at one time.  Marland Kitchen GERD (gastroesophageal reflux disease)   . Insomnia   . Morbid obesity     BMI 49.  Gastric stapling surgery in distant past.  . Peripheral neuropathy     No old records to confirm pt's report.  Pt refuses to have more NCS b/c test is painful.  . Carpal tunnel syndrome     bilateral  . Fibromyalgia      questionable  . Nephrolithiasis     Lithotripsy 2002.  1.8 cm stone in right renal pelvis 02/2013--will likely get this removed by Dr. Jeffie Pollock  . Disequilibrium syndrome     MRI brain normal 2012  . DIZZINESS 04/12/2010    Qualifier: Diagnosis of  By: Anitra Lauth M.D., Brien Few   . Carpal tunnel syndrome on both sides 06/21/2010  . Mixed incontinence urge and stress   . Microscopic hematuria 2014    CT abd pelv showed 1.8 cm right renal pelvis stone.  Also had UA c/w UTI so abx given.   . Shoulder pain     HX OF BILATERAL SHOULDER SURGERIES - PT HAS VERY LIMITED ROM - ESPECIALLY RAISING HER ARMS  . Complication of anesthesia     STATES SHE WAS TOLD SHE WOKE UP DURING HER KNEE REPLACEMENT SURGERY AND HAD TO BE GIVEN MORE MEDICINE - NOT SURE IF SPINAL OR GENERAL ANESTHESIA  . Chronic venous insufficiency     with edema  and extensive varicose dz: Dr. Linus Mako is managing this, planning laser procedures.; A LOT OF LEG CRAMPS AND LEG STIFFNESS  . Asthma     NOT NEEDED INHALER IN OVER A YEAR  . Heart murmur     functional, w/u done; PALPITATIONS IN THE PAST  . Sleep apnea     PT DOES NOT HAVE MASK OR TUBING - JUST SLEEPS WITH HEAD ELEVATED ON 2 PILLOWS   .  Diabetes mellitus without complication     ORAL MEDICATION  . Urinary frequency     AND NOCTURIA - UP AT NIGHT 5 OR 6 TIMES TO BATHROOM  . Renal stone 05/31/2013    Medications:  Scheduled:  . docusate sodium  100 mg Oral BID  . levothyroxine  175 mcg Oral QAC breakfast  . lisinopril  40 mg Oral q morning - 10a  . metFORMIN  500 mg Oral Q supper  . oxyCODONE  15 mg Oral QID  . patient's guide to using coumadin book   Does not apply Once  . predniSONE  10 mg Oral Q breakfast   Infusions:  . sodium chloride 20 mL/hr at 06/07/13 0753  . heparin 1,950 Units/hr (06/07/13 1018)   Assessment:  58 yr female known to pharmacy from heparin dosing for + PE  Today with new complaint of tenderness and redness on the left inner  thigh  WBC = 9.5, Temp = 98.2 F, Scr = 0.77 with CrCl (n) ~ 91 ml/min  Obese patient with weight = 138.3 kg  Vancomycin per pharmacy ordered for treatment of cellulitis   Goal of Therapy:  Vancomycin trough level 10-15 mcg/ml  Plan:  Measure antibiotic drug levels at steady state Follow up culture results Vancomycin 2500mg  IV x 1 loading dose followed by 1250mg  IV q12h  Tricia Perry, Toribio Harbour, PharmD 06/07/2013,5:46 PM

## 2013-06-07 NOTE — Progress Notes (Signed)
ANTICOAGULATION CONSULT NOTE - Follow Up Consult  Pharmacy Consult for Heparin Indication: pulmonary embolus  Allergies  Allergen Reactions  . Codeine Itching    Back hurts  . Mold Extract [Trichophyton] Nausea And Vomiting and Other (See Comments)    Sneezing real bad   . Morphine Itching    back hurts    Patient Measurements: Height: 5\' 4"  (162.6 cm) Weight: 305 lb (138.347 kg) IBW/kg (Calculated) : 54.7 Heparin Dosing Weight: 90 kg  Vital Signs: Temp: 98.2 F (36.8 C) (03/10 0502) Temp src: Oral (03/10 0502) BP: 138/43 mmHg (03/10 0502) Pulse Rate: 91 (03/10 0502)  Labs:  Recent Labs  06/05/13 0522 06/06/13 0036 06/06/13 0800 06/06/13 1423 06/07/13 0428  HGB 11.2* 11.1*  --   --  11.1*  HCT 34.6* 34.2*  --   --  33.5*  PLT 287 258  --   --  314  LABPROT 12.5 12.1  --   --  11.8  INR 0.95 0.91  --   --  0.88  HEPARINUNFRC  --  0.19* 0.41 0.46  --   CREATININE 1.04 0.77  --   --   --     Estimated Creatinine Clearance: 106.6 ml/min (by C-G formula based on Cr of 0.77).   Medications:  Scheduled:  . docusate sodium  100 mg Oral BID  . levothyroxine  175 mcg Oral QAC breakfast  . lisinopril  40 mg Oral q morning - 10a  . metFORMIN  500 mg Oral Q supper  . oxyCODONE  15 mg Oral QID  . patient's guide to using coumadin book   Does not apply Once  . predniSONE  10 mg Oral Q breakfast   Infusions:  . sodium chloride 20 mL/hr at 06/05/13 2316  . heparin      Assessment: 1 yoF s/p right perc nephrolithotomy on 3/3 and developed SOB on 3/4.  CT angio confirm acute right middle lobe PE. Started on anticoagulation with heparin/warfarin, then changed to lovenox/warfarin on 3/5. All stopped on 3/6 when patient developed hematuria. Renal US showed no evidence of per-nephric hematoma, Hgb remained stable and hematuria was improving so urology ordered to slowly resume heparin 3/8 at the lowest acceptable dose as she is still having some hematuria. Patient  experienced more hematuria on heparin 3/8 PM so heparin was held.  This AM, urology notes that bleeding has stopped and reorded the heparin to resume as the risk of thombosis outweighs the risk of hematuria at this time.  Warfarin remains on hold.  Heparin level previously therapeutic with heparin infusion at 1950 units/hr.  Hgb 11.1 (stable), platelets WNL  Goal of Therapy:  Heparin levels 0.3-0.7 (currenlty aiming for 0.3-0.5 given current hematuria) Monitor platelets by anticoagulation protocol: Yes   Plan:   Resume heparin at 1950 units/hr.  Check heparin level in 8 hours since resumed without bolus.  Daily heparin level and CBC  F/u hematuria.  Hershal Coria, PharmD, BCPS Pager: [phone] 06/07/2013 7:41 AM

## 2013-06-07 NOTE — Progress Notes (Addendum)
Patient ID: Tricia Perry, female   DOB: Jul 16, 1955, 58 y.o.   MRN: [id number]  [facility] PROGRESS NOTE  Tricia Perry NWG:[id number] DOB: May 29, 1955 DOA: 05/31/2013 PCP: Tammi Sou, MD  Brief narrative:  58 year old female with past medical history of asthma, hypertension, diabetes, kidney stone who presented to [facility] 05/31/13 for evaluation 2.3 cm stone found on CT scan. She subsequently underwent right percutaneous nephrolithotomy 05/31/2013. She was doing relatively ok post procedure but has developed sudden shortness of breath this morning for which reason [facility] consulted.   Assessment and Plan:  Principal Problem:  Acute respiratory distress  - secondary to to PE as confirmed by CT angio chest  - due to hematuria, heparin held second day into anticoagulation, heparin resumed 3/8 but again with persistent hematuria - discussed with Dr. Jeffie Pollock and vascular surgery, recommendation is to place IVC filter  - continue duoneb every 2 hours PRN  - continue oxygen support via nasal canula to keep O2 saturation above 90%  - incentive spirometry added  Active Problems:  Hematuria  - persistent and worse with heparin, IR consult for IVC filter placement  - renal US with no acute abnormalities that could explain etiology  - restarted anticoagulation 3/8 very slowly but still with hematuria  Medial thigh varicose vein with thrombophlebitis - place on Vancomycin today and transition to oral ABX in 1-2 days - place warm compresses  Right renal stone  - status post right perc nephrolithotomy  - manage per GU  Hypertension  - continue lisinopril  Diabetes mellitus, type II  - continue metformin  Leukocytosis  - secondary to steroids, WBC trending down as we are tapering Prednisone dose down  - WBC is WNL this AM   Procedures/Studies:  Abd 1 View 06/01/2013 No visualize retained stone fragments.  Ct Angio Chest Pe W/cm &/or Wo Cm 06/01/2013 Pulmonary embolus within the right middle lobe  pulmonary artery with segmental extension. The RV LV ratio is within normal limits. Interstitial infiltrate likely reflecting component of pulmonary edema an infectious or inflammatory abnormality cannot be excluded.  Dg Chest Port 1 View 06/01/2013 Subsegmental bibasilar atelectasis in a low volume chest.  Antibiotics:  None  Code Status: Full  Family Communication: Pt and husband at bedside  Disposition Plan: Home when medically stable, per primary team   HPI/Subjective: No events overnight.   Objective: Filed Vitals:   06/06/13 1338 06/06/13 2154 06/07/13 0502 06/07/13 1349  BP: 109/59 130/72 138/43 136/75  Pulse: 105 101 91 96  Temp: 98.5 F (36.9 C) 99.3 F (37.4 C) 98.2 F (36.8 C) 98.2 F (36.8 C)  TempSrc: Oral Oral Oral Oral  Resp: 20 22 20 20   Height:      Weight:      SpO2: 97% 97% 98% 98%    Intake/Output Summary (Last 24 hours) at 06/07/13 2020 Last data filed at 06/07/13 1900  Gross per 24 hour  Intake    960 ml  Output   1200 ml  Net   -240 ml    Exam:   General:  Pt is alert, follows commands appropriately, not in acute distress  Cardiovascular: Regular rate and rhythm, S1/S2, no murmurs, no rubs, no gallops  Respiratory: Clear to auscultation bilaterally, no wheezing, no crackles, no rhonchi  Abdomen: Soft, non tender, non distended, bowel sounds present, no guarding  Extremities: No edema, pulses DP and PT palpable bilaterally  Neuro: Grossly nonfocal  Data Reviewed: Basic Metabolic Panel:  Recent Labs Lab  06/03/13 0411 06/04/13 0455 06/05/13 0522 06/06/13 0036  NA 135* 135* 135* 138  K 4.3 4.2 4.3 4.4  CL 97 95* 95* 97  CO2 27 32 30 28  GLUCOSE 155* 110* 115* 106*  BUN 17 23 23  24*  CREATININE 0.92 1.10 1.04 0.77  CALCIUM 9.3 9.5 9.1 8.8   CBC:  Recent Labs Lab 06/03/13 0411 06/04/13 0455 06/05/13 0522 06/06/13 0036 06/07/13 0428  WBC 12.9* 13.0* 11.5* 9.6 9.5  HGB 11.0* 11.3* 11.2* 11.1* 11.1*  HCT 34.5* 35.4* 34.6*  34.2* 33.5*  MCV 91.0 91.5 91.1 91.7 89.8  PLT 257 274 287 258 314   Scheduled Meds: . docusate sodium  100 mg Oral BID  . levothyroxine  175 mcg Oral QAC breakfast  . lisinopril  40 mg Oral q morning - 10a  . metFORMIN  500 mg Oral Q supper  . oxyCODONE  15 mg Oral QID  . patient's guide to using coumadin book   Does not apply Once  . predniSONE  10 mg Oral Q breakfast  . [START ON 06/08/2013] vancomycin  1,250 mg Intravenous Q12H  . vancomycin  2,500 mg Intravenous Once   Continuous Infusions: . sodium chloride 20 mL/hr at 06/07/13 0753  . heparin 1,950 Units/hr (06/07/13 1018)   Faye Ramsay, MD  [facility] Pager [phone]  If 7PM-7AM, please contact night-coverage [email or web address] Password [email or web address] 06/07/2013, 8:20 PM   LOS: 7 days

## 2013-06-07 NOTE — Progress Notes (Signed)
Patient ID: Tricia Perry, female   DOB: December 11, 1955, 58 y.o.   MRN: [id number] 7 Days Post-Op  Subjective: 1) s/p right PCNL with post op PE.   She had been on heparin but had bleeding again last night. So the heparin was held and the bleeding has stopped.  2) PE.  She has had some increased SOB overnight but her sats are good.  3) New complaint of tenderness and redness on the left inner thigh which appears to be thrombophlebitis of a large, tortuous varicosity.  ROS: Negative except for SOB, left inner thigh tenderness and mild right flank pain.  She has no nausea.   Objective: Vital signs in last 24 hours: Temp:  [98.2 F (36.8 C)-99.3 F (37.4 C)] 98.2 F (36.8 C) (03/10 0502) Pulse Rate:  [91-105] 91 (03/10 0502) Resp:  [20-22] 20 (03/10 0502) BP: (109-138)/(43-72) 138/43 mmHg (03/10 0502) SpO2:  [97 %-98 %] 98 % (03/10 0502)  Intake/Output from previous day: 03/09 0701 - 03/10 0700 In: 1082.4 [P.O.:720; I.V.:362.4] Out: 1500 [Urine:1500] Intake/Output this shift:    General appearance: alert, no distress and morbidly obese Resp: clear to auscultation bilaterally and with decreased effort Cardio: regular rate and rhythm GI: soft, obese with mild right upper quadrant tenderness.  Extremities: She has erythema and tenderness over a large, tortuous varicosity of the left inner thigh.  The calves are non-tender and the PAS hose are in place.   Lab Results:   Recent Labs  06/06/13 0036 06/07/13 0428  WBC 9.6 9.5  HGB 11.1* 11.1*  HCT 34.2* 33.5*  PLT 258 314   BMET  Recent Labs  06/05/13 0522 06/06/13 0036  NA 135* 138  K 4.3 4.4  CL 95* 97  CO2 30 28  GLUCOSE 115* 106*  BUN 23 24*  CREATININE 1.04 0.77  CALCIUM 9.1 8.8   PT/INR  Recent Labs  06/06/13 0036 06/07/13 0428  LABPROT 12.1 11.8  INR 0.91 0.88   ABG No results found for this basename: PHART, PCO2, PO2, HCO3,  in the last 72 hours  Studies/Results: No results  found.  Anti-infectives: Anti-infectives   Start     Dose/Rate Route Frequency Ordered Stop   05/31/13 2000  ciprofloxacin (CIPRO) tablet 500 mg  Status:  Discontinued     500 mg Oral 2 times daily 05/31/13 1442 06/02/13 1330   05/31/13 0730  ciprofloxacin (CIPRO) IVPB 400 mg  Status:  Discontinued     400 mg 200 mL/hr over 60 Minutes Intravenous On call 05/31/13 0724 05/31/13 0730   05/31/13 0724  ciprofloxacin (CIPRO) IVPB 400 mg  Status:  Discontinued     400 mg 200 mL/hr over 60 Minutes Intravenous 60 min pre-op 05/31/13 0724 05/31/13 1442      Current Facility-Administered Medications  Medication Dose Route Frequency Provider Last Rate Last Dose  . 0.9 %  sodium chloride infusion   Intravenous Continuous Irine Seal, MD 20 mL/hr at 06/05/13 2316    . acetaminophen (TYLENOL) tablet 650 mg  650 mg Oral Q4H PRN Irine Seal, MD   650 mg at 06/04/13 0443  . bisacodyl (DULCOLAX) suppository 10 mg  10 mg Rectal Daily PRN Irine Seal, MD   10 mg at 06/02/13 2049  . diphenhydrAMINE (BENADRYL) injection 12.5-25 mg  12.5-25 mg Intravenous Q6H PRN Irine Seal, MD       Or  . diphenhydrAMINE (BENADRYL) 12.5 MG/5ML elixir 12.5-25 mg  12.5-25 mg Oral Q6H PRN Irine Seal, MD   12.5  mg at 06/05/13 2200  . docusate sodium (COLACE) capsule 100 mg  100 mg Oral BID Irine Seal, MD   100 mg at 06/06/13 2147  . HYDROmorphone (DILAUDID) injection 2 mg  2 mg Intravenous Q2H PRN Irine Seal, MD   2 mg at 06/05/13 2200  . hyoscyamine (LEVSIN SL) SL tablet 0.125 mg  0.125 mg Oral Q4H PRN Irine Seal, MD   0.125 mg at 06/06/13 0535  . ipratropium-albuterol (DUONEB) 0.5-2.5 (3) MG/3ML nebulizer solution 3 mL  3 mL Nebulization Q2H PRN Robbie Lis, MD   3 mL at 06/01/13 1310  . levothyroxine (SYNTHROID, LEVOTHROID) tablet 175 mcg  175 mcg Oral QAC breakfast Irine Seal, MD   175 mcg at 06/06/13 [phone]  . lisinopril (PRINIVIL,ZESTRIL) tablet 40 mg  40 mg Oral q morning - 10a Irine Seal, MD   40 mg at 06/06/13 1010  .  LORazepam (ATIVAN) tablet 1 mg  1 mg Oral Q6H PRN Irine Seal, MD   1 mg at 06/06/13 2237   Or  . LORazepam (ATIVAN) injection 1 mg  1 mg Intramuscular Q4H PRN Irine Seal, MD      . metFORMIN (GLUCOPHAGE) tablet 500 mg  500 mg Oral Q supper Irine Seal, MD   500 mg at 06/06/13 1747  . ondansetron (ZOFRAN) injection 4 mg  4 mg Intravenous Q4H PRN Irine Seal, MD   4 mg at 06/03/13 0208  . oxyCODONE (Oxy IR/ROXICODONE) immediate release tablet 15 mg  15 mg Oral QID Irine Seal, MD   15 mg at 06/06/13 2146  . oxyCODONE (Oxy IR/ROXICODONE) immediate release tablet 5 mg  5 mg Oral Q4H PRN Irine Seal, MD   5 mg at 06/07/13 0446  . patient's guide to using coumadin book   Does not apply Once Posey Rea, [facility]      . predniSONE (DELTASONE) tablet 10 mg  10 mg Oral Q breakfast Theodis Blaze, MD   10 mg at 06/06/13 3212   I have reviewed her recent labs and hospitalist notes.  Assessment: s/p Procedure(s): NEPHROLITHOTOMY PERCUTANEOUS  The hematuria has resolved off of heparin but she has increased SOB and a new finding of probable thrombophlebitis of a left inner thigh varicosity.   Her Hgb was stable and the bleeding was minimal.    Plan: I am going to renew the heparin as the risk of thombosis outweighs the risk of hematuria at this time.  I will confer with Dr. Doyle Askew about whether a Greenfield filter would be advisable and what else might need to be done to manage this complex situation.         LOS: 7 days    Deepa Barthel J 06/07/2013

## 2013-06-08 DIAGNOSIS — M79609 Pain in unspecified limb: Secondary | ICD-10-CM

## 2013-06-08 DIAGNOSIS — I8000 Phlebitis and thrombophlebitis of superficial vessels of unspecified lower extremity: Secondary | ICD-10-CM

## 2013-06-08 LAB — BASIC METABOLIC PANEL
BUN: 14 mg/dL (ref 6–23)
CO2: 28 mEq/L (ref 19–32)
CREATININE: 0.63 mg/dL (ref 0.50–1.10)
Calcium: 8.9 mg/dL (ref 8.4–10.5)
Chloride: 97 mEq/L (ref 96–112)
GLUCOSE: 110 mg/dL — AB (ref 70–99)
Potassium: 3.9 mEq/L (ref 3.7–5.3)
Sodium: 136 mEq/L — ABNORMAL LOW (ref 137–147)

## 2013-06-08 LAB — CBC
HEMATOCRIT: 33.4 % — AB (ref 36.0–46.0)
Hemoglobin: 10.9 g/dL — ABNORMAL LOW (ref 12.0–15.0)
MCH: 29.5 pg (ref 26.0–34.0)
MCHC: 32.6 g/dL (ref 30.0–36.0)
MCV: 90.3 fL (ref 78.0–100.0)
PLATELETS: 310 10*3/uL (ref 150–400)
RBC: 3.7 MIL/uL — ABNORMAL LOW (ref 3.87–5.11)
RDW: 14 % (ref 11.5–15.5)
WBC: 11.2 10*3/uL — ABNORMAL HIGH (ref 4.0–10.5)

## 2013-06-08 LAB — HEPARIN LEVEL (UNFRACTIONATED)
Heparin Unfractionated: 0.55 IU/mL (ref 0.30–0.70)
Heparin Unfractionated: 0.41 IU/mL (ref 0.30–0.70)

## 2013-06-08 LAB — PROTIME-INR
INR: 0.95 (ref 0.00–1.49)
Prothrombin Time: 12.5 seconds (ref 11.6–15.2)

## 2013-06-08 MED ORDER — SODIUM CHLORIDE 0.9 % IJ SOLN
10.0000 mL | INTRAMUSCULAR | Status: DC | PRN
  Administered 2013-06-09 – 2013-06-11 (×4): 10 mL

## 2013-06-08 NOTE — Consult Note (Signed)
[facility] PROGRESS NOTE  Tricia Perry HBZ:[id number] DOB: 1955/06/23 DOA: 05/31/2013 PCP: Tammi Sou, MD  Assessment/Plan: 58 year old female with past medical history of asthma, hypertension, diabetes, kidney stone who presented to [facility] 05/31/13 for evaluation 2.3 cm stone found on CT scan. She subsequently underwent right percutaneous nephrolithotomy 05/31/2013. She was doing relatively ok post procedure but has developed sudden shortness found to have PE   Principal Problem:  1. Acute PE; CTA: Pulmonary embolus within the right middle lobe pulmonary artery with segmental extension. The RV LV ratio is within normal limits - developed hematuria on heparin; heparin held then resumed 3/8  - Dr. Doyle Askew discussed with Dr. Jeffie Pollock and vascular surgery, recommendation is to place IVC filter  - 3/11: hematuria seems to be resolving, Hg stable; hold IVC filter; check LE [place] r/o DVT; cont IV heparin; if Hg stable no more hematuria will plan xarelto on 3/12;   2. Hematuria seem to be resolving   - renal US with no acute abnormalities that could explain etiology  - monitor on AC 3. L thigh cellulitis, thrombophlebitis   - place on Vancomycin today and transition to oral ABX in 1-2 days  - place warm compresses  4. Right renal stone  - status post right perc nephrolithotomy  - manage per GU  5. Hypertension  - continue lisinopril  6. Diabetes mellitus, type II  - continue metformin    Procedures/Studies:  Abd 1 View 06/01/2013 No visualize retained stone fragments.  Ct Angio Chest Pe W/cm &/or Wo Cm 06/01/2013 Pulmonary embolus within the right middle lobe pulmonary artery with segmental extension. The RV LV ratio is within normal limits. Interstitial infiltrate likely reflecting component of pulmonary edema an infectious or inflammatory abnormality cannot be excluded.  Dg Chest Port 1 View 06/01/2013 Subsegmental bibasilar atelectasis in a low volume chest.   Code Status: full Family  Communication: d/w patient, her husband (indicate person spoken with, relationship, and if by phone, the number) Disposition Plan: home when clinically improved    Consultants: Urology   Procedures:    Antibiotics: Vancomycin 3/10<<<<  HPI/Subjective: alert  Objective: Filed Vitals:   06/08/13 0530  BP: 103/68  Pulse: 84  Temp: 98.5 F (36.9 C)  Resp: 20    Intake/Output Summary (Last 24 hours) at 06/08/13 0917 Last data filed at 06/08/13 0630  Gross per 24 hour  Intake 2283.46 ml  Output    800 ml  Net 1483.46 ml   Filed Weights   05/31/13 0725  Weight: 138.347 kg (305 lb)    Exam:   General:  alert  Cardiovascular: s1,s2 rrr  Respiratory: CTA BL  Abdomen: soft, nt, nd   Musculoskeletal: non pitting edema    Data Reviewed: Basic Metabolic Panel:  Recent Labs Lab 06/03/13 0411 06/04/13 0455 06/05/13 0522 06/06/13 0036 06/08/13 0420  NA 135* 135* 135* 138 136*  K 4.3 4.2 4.3 4.4 3.9  CL 97 95* 95* 97 97  CO2 27 32 30 28 28   GLUCOSE 155* 110* 115* 106* 110*  BUN 17 23 23  24* 14  CREATININE 0.92 1.10 1.04 0.77 0.63  CALCIUM 9.3 9.5 9.1 8.8 8.9   Liver Function Tests: No results found for this basename: AST, ALT, ALKPHOS, BILITOT, PROT, ALBUMIN,  in the last 168 hours No results found for this basename: LIPASE, AMYLASE,  in the last 168 hours No results found for this basename: AMMONIA,  in the last 168 hours CBC:  Recent Labs Lab 06/04/13  1448 06/05/13 0522 06/06/13 0036 06/07/13 0428 06/08/13 0420  WBC 13.0* 11.5* 9.6 9.5 11.2*  HGB 11.3* 11.2* 11.1* 11.1* 10.9*  HCT 35.4* 34.6* 34.2* 33.5* 33.4*  MCV 91.5 91.1 91.7 89.8 90.3  PLT 274 287 258 314 310   Cardiac Enzymes: No results found for this basename: CKTOTAL, CKMB, CKMBINDEX, TROPONINI,  in the last 168 hours BNP (last 3 results)  Recent Labs  04/20/13 1347  PROBNP 36.0   CBG: No results found for this basename: GLUCAP,  in the last 168 hours  No results found for  this or any previous visit (from the past 240 hour(s)).   Studies: No results found.  Scheduled Meds: . docusate sodium  100 mg Oral BID  . levothyroxine  175 mcg Oral QAC breakfast  . lisinopril  40 mg Oral q morning - 10a  . metFORMIN  500 mg Oral Q supper  . oxyCODONE  15 mg Oral QID  . patient's guide to using coumadin book   Does not apply Once  . vancomycin  1,250 mg Intravenous Q12H   Continuous Infusions: . sodium chloride 20 mL/hr at 06/08/13 0140  . heparin 2,150 Units/hr (06/07/13 2316)    Principal Problem:   Renal stone Active Problems:   Pulmonary embolism on right   Thrombophlebitis leg superficial    Time spent: >35 minutes     Kinnie Feil  [facility] Pager [phone]. If 7PM-7AM, please contact night-coverage at [email or web address], password [facility] 06/08/2013, 9:17 AM  LOS: 8 days

## 2013-06-08 NOTE — Progress Notes (Signed)
Bilateral lower extremity venous duplex:  No obvious evidence of DVT or Baker's Cyst.  Left:  There appears to be superficial thrombosis in the varicose veins in the distal thigh and proximal calf.  Technically difficult study due to the patient's body habitus.

## 2013-06-08 NOTE — Progress Notes (Signed)
Patient ID: Tricia Perry, female   DOB: [birth year], 58 y.o.   MRN: [id number] 8 Days Post-Op  Subjective: 1) 8 days post right PCNL.   Her urine remains clear this morning after resuming Heparin yesterday.   She has mild right flank pain.   2) PE with onset of SOB on postop day #1.   Still having SOB but sats are 100%.  Anticoagulation has been interrupted by recurrent hematuria.  Now back on Heparin.   Per patient she had an elevated d-dimer about 2 weeks prior to the procedure and had a complete w/u with no other findings.  3) Superficial thrombophlebitis in a left leg varicosity with some increased tenderness and erythema today.  She is now on vancomycin and Dr. Doyle Askew discussed the case with vascular surgery and a Greenfield filter was recommended along with the antibiotic and continued anticoagulation.  SCD's remain in place.   ROS: Negative except for SOB, mild right flank tenderness and redness and tenderness of the left inner thigh and calf.   Objective: Vital signs in last 24 hours: Temp:  [98.2 F (36.8 C)-98.5 F (36.9 C)] 98.5 F (36.9 C) (03/11 0530) Pulse Rate:  [84-96] 84 (03/11 0530) Resp:  [18-20] 20 (03/11 0530) BP: (103-136)/(62-75) 103/68 mmHg (03/11 0530) SpO2:  [98 %-100 %] 100 % (03/11 0530)  Intake/Output from previous day: 03/10 0701 - 03/11 0700 In: 2092.2 [P.O.:960; I.V.:632.2; IV Piggyback:500] Out: 800 [Urine:800] Intake/Output this shift: Total I/O In: 652.2 [P.O.:240; I.V.:412.2] Out: -   General appearance: alert, no distress and morbidly obese Resp: clear to auscultation bilaterally Cardio: regular rate and rhythm GI: Soft, obese with mild RUQ/flank tenderness. Extremities: She has further extention of the erythema and tenderness into the upper medial calf and inner thigh  Lab Results:   Recent Labs  06/07/13 0428 06/08/13 0420  WBC 9.5 11.2*  HGB 11.1* 10.9*  HCT 33.5* 33.4*  PLT 314 310   BMET  Recent Labs  06/06/13 0036  06/08/13 0420  NA 138 136*  K 4.4 3.9  CL 97 97  CO2 28 28  GLUCOSE 106* 110*  BUN 24* 14  CREATININE 0.77 0.63  CALCIUM 8.8 8.9   PT/INR  Recent Labs  06/07/13 0428 06/08/13 0420  LABPROT 11.8 12.5  INR 0.88 0.95   ABG No results found for this basename: PHART, PCO2, PO2, HCO3,  in the last 72 hours  Studies/Results: No results found.  Anti-infectives: Anti-infectives   Start     Dose/Rate Route Frequency Ordered Stop   06/08/13 0600  vancomycin (VANCOCIN) 1,250 mg in sodium chloride 0.9 % 250 mL IVPB     1,250 mg 166.7 mL/hr over 90 Minutes Intravenous Every 12 hours 06/07/13 1752     06/07/13 1800  vancomycin (VANCOCIN) 2,500 mg in sodium chloride 0.9 % 500 mL IVPB     2,500 mg 250 mL/hr over 120 Minutes Intravenous  Once 06/07/13 1751 06/07/13 2021   05/31/13 2000  ciprofloxacin (CIPRO) tablet 500 mg  Status:  Discontinued     500 mg Oral 2 times daily 05/31/13 1442 06/02/13 1330   05/31/13 0730  ciprofloxacin (CIPRO) IVPB 400 mg  Status:  Discontinued     400 mg 200 mL/hr over 60 Minutes Intravenous On call 05/31/13 0724 05/31/13 0730   05/31/13 0724  ciprofloxacin (CIPRO) IVPB 400 mg  Status:  Discontinued     400 mg 200 mL/hr over 60 Minutes Intravenous 60 min pre-op 05/31/13 0724 05/31/13 1442  Current Facility-Administered Medications  Medication Dose Route Frequency Provider Last Rate Last Dose  . 0.9 %  sodium chloride infusion   Intravenous Continuous Irine Seal, MD 20 mL/hr at 06/08/13 0140    . acetaminophen (TYLENOL) tablet 650 mg  650 mg Oral Q4H PRN Irine Seal, MD   650 mg at 06/04/13 0443  . bisacodyl (DULCOLAX) suppository 10 mg  10 mg Rectal Daily PRN Irine Seal, MD   10 mg at 06/02/13 2049  . diphenhydrAMINE (BENADRYL) injection 12.5-25 mg  12.5-25 mg Intravenous Q6H PRN Irine Seal, MD       Or  . diphenhydrAMINE (BENADRYL) 12.5 MG/5ML elixir 12.5-25 mg  12.5-25 mg Oral Q6H PRN Irine Seal, MD   12.5 mg at 06/05/13 2200  . docusate sodium  (COLACE) capsule 100 mg  100 mg Oral BID Irine Seal, MD   100 mg at 06/07/13 2145  . heparin ADULT infusion 100 units/mL (25000 units/250 mL)  2,150 Units/hr Intravenous Continuous Leann Trefz Poindexter, RPH 21.5 mL/hr at 06/07/13 2316 2,150 Units/hr at 06/07/13 2316  . HYDROmorphone (DILAUDID) injection 2 mg  2 mg Intravenous Q2H PRN Irine Seal, MD   2 mg at 06/08/13 0123  . hyoscyamine (LEVSIN SL) SL tablet 0.125 mg  0.125 mg Oral Q4H PRN Irine Seal, MD   0.125 mg at 06/06/13 0535  . ipratropium-albuterol (DUONEB) 0.5-2.5 (3) MG/3ML nebulizer solution 3 mL  3 mL Nebulization Q2H PRN Robbie Lis, MD   3 mL at 06/01/13 1310  . levothyroxine (SYNTHROID, LEVOTHROID) tablet 175 mcg  175 mcg Oral QAC breakfast Irine Seal, MD   175 mcg at 06/07/13 1014  . lisinopril (PRINIVIL,ZESTRIL) tablet 40 mg  40 mg Oral q morning - 10a Irine Seal, MD   40 mg at 06/07/13 1014  . LORazepam (ATIVAN) tablet 1 mg  1 mg Oral Q6H PRN Irine Seal, MD   1 mg at 06/06/13 2237   Or  . LORazepam (ATIVAN) injection 1 mg  1 mg Intramuscular Q4H PRN Irine Seal, MD      . metFORMIN (GLUCOPHAGE) tablet 500 mg  500 mg Oral Q supper Irine Seal, MD   500 mg at 06/07/13 1818  . ondansetron (ZOFRAN) injection 4 mg  4 mg Intravenous Q4H PRN Irine Seal, MD   4 mg at 06/03/13 0208  . oxyCODONE (Oxy IR/ROXICODONE) immediate release tablet 15 mg  15 mg Oral QID Irine Seal, MD   15 mg at 06/07/13 2145  . oxyCODONE (Oxy IR/ROXICODONE) immediate release tablet 5 mg  5 mg Oral Q4H PRN Irine Seal, MD   5 mg at 06/07/13 0446  . patient's guide to using coumadin book   Does not apply Once Posey Rea, [facility]      . vancomycin (VANCOCIN) 1,250 mg in sodium chloride 0.9 % 250 mL IVPB  1,250 mg Intravenous Q12H Theodis Blaze, MD        Assessment: s/p Procedure(s): NEPHROLITHOTOMY PERCUTANEOUS  S/P PCNL for right renal stone with postop PE, recurrent hematuria and left LE superficial thrombophlebitis.    She is back on heparin without  hematuria today.    Plan: She is for an IR consultation for a Greenfield filter today. Continue Heparin. Continue Vancomycin.  Consider vascular consultation if the thrombophlebitis doesn't improve on antibiotics.     LOS: 8 days    Azan Maneri J 06/08/2013

## 2013-06-08 NOTE — Progress Notes (Signed)
Attempted right forearm PIV x2, once with [place], successful.  Tolerated well. Pt. with 5 PIV starts in 72 hours and all 5 PIV removed secondary to pain and/or infiltration/phlebitis. Recommend PICC for access secondary to receiving Vancomycin and multiple attempts and  Multiple PIVs.

## 2013-06-08 NOTE — Progress Notes (Signed)
Pt had 7 beats of V tach. Asymptomatic. MD notified.

## 2013-06-08 NOTE — Progress Notes (Signed)
ANTICOAGULATION CONSULT NOTE - Follow Up Consult  Pharmacy Consult for Heparin Indication: pulmonary embolus  Allergies  Allergen Reactions  . Codeine Itching    Back hurts  . Mold Extract [Trichophyton] Nausea And Vomiting and Other (See Comments)    Sneezing real bad   . Morphine Itching    back hurts    Patient Measurements: Height: 5\' 4"  (162.6 cm) Weight: 305 lb (138.347 kg) IBW/kg (Calculated) : 54.7 Heparin Dosing Weight:   Vital Signs: Temp: 98.5 F (36.9 C) (03/11 0530) Temp src: Oral (03/11 0530) BP: 103/68 mmHg (03/11 0530) Pulse Rate: 84 (03/11 0530)  Labs:  Recent Labs  06/06/13 0036  06/06/13 1423 06/07/13 0428 06/07/13 1811 06/08/13 0420  HGB 11.1*  --   --  11.1*  --  10.9*  HCT 34.2*  --   --  33.5*  --  33.4*  PLT 258  --   --  314  --  310  LABPROT 12.1  --   --  11.8  --  12.5  INR 0.91  --   --  0.88  --  0.95  HEPARINUNFRC 0.19*  < > 0.46  --  0.19* 0.55  CREATININE 0.77  --   --   --   --  0.63  < > = values in this interval not displayed.  Estimated Creatinine Clearance: 106.6 ml/min (by C-G formula based on Cr of 0.63).   Medications:  Infusions:  . sodium chloride 20 mL/hr at 06/08/13 0140  . heparin 2,150 Units/hr (06/07/13 2316)    Assessment: Patient with heparin level at goal.  No issues per RN. No more blood in urine per RN.  Goal of Therapy:  Heparin level 0.3-0.7 units/ml  Monitor platelets by anticoagulation protocol: Yes   Plan: Continue heparin drip at current rate for now. Recheck level at [place], Freeman Crowford 06/08/2013,6:00 AM

## 2013-06-08 NOTE — Progress Notes (Signed)
ANTICOAGULATION CONSULT NOTE - Follow Up Consult  Pharmacy Consult for Heparin Indication: pulmonary embolus  Allergies  Allergen Reactions  . Codeine Itching    Back hurts  . Mold Extract [Trichophyton] Nausea And Vomiting and Other (See Comments)    Sneezing real bad   . Morphine Itching    back hurts    Patient Measurements: Height: 5\' 4"  (162.6 cm) Weight: 305 lb (138.347 kg) IBW/kg (Calculated) : 54.7 Heparin Dosing Weight: 90 kg  Vital Signs: Temp: 98.5 F (36.9 C) (03/11 0530) Temp src: Oral (03/11 0530) BP: 120/66 mmHg (03/11 1049) Pulse Rate: 84 (03/11 0530)  Labs:  Recent Labs  06/06/13 0036  06/07/13 0428 06/07/13 1811 06/08/13 0420 06/08/13 1040  HGB 11.1*  --  11.1*  --  10.9*  --   HCT 34.2*  --  33.5*  --  33.4*  --   PLT 258  --  314  --  310  --   LABPROT 12.1  --  11.8  --  12.5  --   INR 0.91  --  0.88  --  0.95  --   HEPARINUNFRC 0.19*  < >  --  0.19* 0.55 0.41  CREATININE 0.77  --   --   --  0.63  --   < > = values in this interval not displayed.  Estimated Creatinine Clearance: 106.6 ml/min (by C-G formula based on Cr of 0.63).   Medications:  Scheduled:  . docusate sodium  100 mg Oral BID  . levothyroxine  175 mcg Oral QAC breakfast  . lisinopril  40 mg Oral q morning - 10a  . metFORMIN  500 mg Oral Q supper  . oxyCODONE  15 mg Oral QID  . patient's guide to using coumadin book   Does not apply Once  . vancomycin  1,250 mg Intravenous Q12H   Infusions:  . sodium chloride 20 mL/hr at 06/08/13 0140  . heparin 2,150 Units/hr (06/08/13 1047)    Assessment: 55 yoF s/p right perc nephrolithotomy on 3/3 and developed SOB on 3/4.  CT angio confirm acute right middle lobe PE. Started on anticoagulation with heparin/warfarin, then changed to lovenox/warfarin on 3/5. All stopped on 3/6 when patient developed hematuria. Renal US showed no evidence of per-nephric hematoma, Hgb remained stable and hematuria was improving so urology ordered to  slowly resume heparin 3/8 at the lowest acceptable dose as she is still having some hematuria. Patient experienced more hematuria on heparin 3/8 PM so heparin was held.  This AM, urology notes that bleeding has stopped and reorded the heparin to resume as the risk of thombosis outweighs the risk of hematuria at this time.  Warfarin remains on hold.  Heparin level therapeutic for second level in a row on rate of 2150 units/hr  Hgb stable, platelets WNL  Bleeding resolved as of right now with plan per Md to switch to Xarelto tomorrow if continues to not have hematuria.  Ultimate plan for patient to receive IVC filter  Goal of Therapy:  Heparin levels 0.3-0.7 (currenlty aiming for 0.3-0.5 given current hematuria) Monitor platelets by anticoagulation protocol: Yes   Plan:  1) Continue IV heparin 2150 units/hr 2) Recheck heparin level in AM 3) Follow for bleeding closely  Adrian Saran, PharmD, BCPS Pager [phone] 06/08/2013 11:46 AM

## 2013-06-08 NOTE — Progress Notes (Signed)
Peripherally Inserted Central Catheter/Midline Placement  The IV Nurse has discussed with the patient and/or persons authorized to consent for the patient, the purpose of this procedure and the potential benefits and risks involved with this procedure.  The benefits include less needle sticks, lab draws from the catheter and patient may be discharged home with the catheter.  Risks include, but not limited to, infection, bleeding, blood clot (thrombus formation), and puncture of an artery; nerve damage and irregular heat beat.  Alternatives to this procedure were also discussed.  PICC/Midline Placement Documentation        Henderson [place] 06/08/2013, 12:37 PM

## 2013-06-09 LAB — CBC
HEMATOCRIT: 34.1 % — AB (ref 36.0–46.0)
HEMOGLOBIN: 11 g/dL — AB (ref 12.0–15.0)
MCH: 29 pg (ref 26.0–34.0)
MCHC: 32.3 g/dL (ref 30.0–36.0)
MCV: 90 fL (ref 78.0–100.0)
Platelets: 294 10*3/uL (ref 150–400)
RBC: 3.79 MIL/uL — AB (ref 3.87–5.11)
RDW: 14.1 % (ref 11.5–15.5)
WBC: 12 10*3/uL — ABNORMAL HIGH (ref 4.0–10.5)

## 2013-06-09 LAB — PROTIME-INR
INR: 0.99 (ref 0.00–1.49)
Prothrombin Time: 12.9 seconds (ref 11.6–15.2)

## 2013-06-09 LAB — HEPARIN LEVEL (UNFRACTIONATED): Heparin Unfractionated: 0.76 IU/mL — ABNORMAL HIGH (ref 0.30–0.70)

## 2013-06-09 MED ORDER — HEPARIN (PORCINE) IN NACL 100-0.45 UNIT/ML-% IJ SOLN
1750.0000 [IU]/h | INTRAMUSCULAR | Status: DC
  Administered 2013-06-09: 1750 [IU]/h via INTRAVENOUS
  Filled 2013-06-09 (×2): qty 250

## 2013-06-09 MED ORDER — RIVAROXABAN 15 MG PO TABS
15.0000 mg | ORAL_TABLET | Freq: Once | ORAL | Status: AC
  Administered 2013-06-09: 15 mg via ORAL
  Filled 2013-06-09: qty 1

## 2013-06-09 MED ORDER — CEFTRIAXONE SODIUM 1 G IJ SOLR
1.0000 g | INTRAMUSCULAR | Status: DC
  Administered 2013-06-09 – 2013-06-10 (×2): 1 g via INTRAVENOUS
  Filled 2013-06-09 (×3): qty 10

## 2013-06-09 MED ORDER — RIVAROXABAN (XARELTO) EDUCATION KIT FOR DVT/PE PATIENTS
PACK | Freq: Once | Status: AC
  Administered 2013-06-09: 14:00:00
  Filled 2013-06-09: qty 1

## 2013-06-09 MED ORDER — RIVAROXABAN 15 MG PO TABS
15.0000 mg | ORAL_TABLET | Freq: Two times a day (BID) | ORAL | Status: DC
  Administered 2013-06-10 – 2013-06-11 (×3): 15 mg via ORAL
  Filled 2013-06-09 (×5): qty 1

## 2013-06-09 NOTE — Progress Notes (Signed)
ANTICOAGULATION CONSULT NOTE - Follow Up Consult  Pharmacy Consult for Heparin Indication: pulmonary embolus  Allergies  Allergen Reactions  . Codeine Itching    Back hurts  . Mold Extract [Trichophyton] Nausea And Vomiting and Other (See Comments)    Sneezing real bad   . Morphine Itching    back hurts    Patient Measurements: Height: 5\' 4"  (162.6 cm) Weight: 305 lb (138.347 kg) IBW/kg (Calculated) : 54.7 Heparin Dosing Weight: 90 kg  Vital Signs: Temp: 98.3 F (36.8 C) (03/12 0457) Temp src: Oral (03/12 0457) BP: 104/56 mmHg (03/12 0457) Pulse Rate: 90 (03/12 0457)  Labs:  Recent Labs  06/07/13 0428  06/08/13 0420 06/08/13 1040 06/09/13 0705  HGB 11.1*  --  10.9*  --  11.0*  HCT 33.5*  --  33.4*  --  34.1*  PLT 314  --  310  --  294  LABPROT 11.8  --  12.5  --  12.9  INR 0.88  --  0.95  --  0.99  HEPARINUNFRC  --   < > 0.55 0.41 0.76*  CREATININE  --   --  0.63  --   --   < > = values in this interval not displayed.  Estimated Creatinine Clearance: 106.6 ml/min (by C-G formula based on Cr of 0.63).   Medications:  Scheduled:  . docusate sodium  100 mg Oral BID  . levothyroxine  175 mcg Oral QAC breakfast  . lisinopril  40 mg Oral q morning - 10a  . metFORMIN  500 mg Oral Q supper  . oxyCODONE  15 mg Oral QID  . patient's guide to using coumadin book   Does not apply Once  . vancomycin  1,250 mg Intravenous Q12H   Infusions:  . sodium chloride 20 mL/hr at 06/08/13 0140  . heparin 2,150 Units/hr (06/08/13 2227)    Assessment: 89 yoF s/p right perc nephrolithotomy on 3/3 and developed SOB on 3/4.  CT angio confirm acute right middle lobe PE. Started on anticoagulation with heparin/warfarin, then changed to lovenox/warfarin on 3/5. All stopped on 3/6 when patient developed hematuria. Renal US showed no evidence of per-nephric hematoma, Hgb remained stable and hematuria was improving so urology ordered to slowly resume heparin 3/8 at the lowest acceptable  dose as she is still having some hematuria. Patient experienced more hematuria on heparin 3/8 PM so heparin was held.  This AM, urology notes that bleeding has stopped and reorded the heparin to resume as the risk of thombosis outweighs the risk of hematuria at this time.  Heparin level now supratherapeutic  Hgb stable, platelets WNL  Hematuria resolved per urology with plan per Md to switch to Xarelto.  Goal of Therapy:  Heparin levels 0.3-0.7 (currenlty aiming for 0.3-0.5 given current hematuria) Monitor platelets by anticoagulation protocol: Yes   Plan:  1) Reduce IV heparin rate to 1750 units/hr 2) Recheck heparin level in 6 hours 3) Follow for bleeding closely  4) Await transition to oral anticoagulation  Adrian Saran, PharmD, BCPS Pager [phone] 06/09/2013 8:13 AM

## 2013-06-09 NOTE — Discharge Instructions (Signed)
Percutaneous Nephrolithotomy Kidney stones can cause a great deal of pain. They can block urine from leaving the body. And they can lead to infection. Kidney stones might pass on their own, or they can be broken up by shock waves from a special machine. But sometimes surgery is needed to get rid of kidney stones. One type of surgery is called percutaneous nephrolithotomy. "Nephro" means kidney, "litho" means stone and "tomy" means removal by surgery. "Percutaneous" means through the skin. This type of surgery needs only a small cut (incision) in the skin. This surgery may be suggested for various reasons. They include:  The kidney stones are 2 centimeters wide (about 3/4 inch) or bigger. They also might be oddly shaped.  Other treatments were tried, but all or some of the kidney stones remain.  Infection has developed.  No other treatment can be used. LET YOUR CAREGIVER KNOW ABOUT:   Any allergies.  All medications you are taking, including:  Herbs, eyedrops, over-the-counter medications and creams.  Blood thinners (anticoagulants), aspirin or other drugs that could affect blood clotting.  Use of steroids (by mouth or as creams).  Previous problems with anesthetics, including local anesthetics.  Possibility of pregnancy, if this applies.  Any history of blood clots.  Any history of bleeding or other blood problems.  Previous surgery.  Smoking history.  Other health problems. RISKS AND COMPLICATIONS   During surgery:  Sometimes all the kidney stones cannot be removed through the tube. Then, the percutaneous nephrolithotomy procedure would be stopped. Open surgery would be used to remove the remaining stones.  Short-term risks from the surgery could include:  Excessive bleeding.  Blood in your urine.  Holes in the kidney. These usually heal on their own.  Pain.  Redness or tenderness at the incision site.  Numbness (loss of feeling) in the area treated. Tingling  also is possible.  A pooling of blood in the wound (hematoma).  Infection.  Slow healing.  Longer-term possibilities include:  Kidney damage.  Damage to organs near the kidney.  Need for a repeat surgery. BEFORE THE PROCEDURE  You may need to take some tests before your surgery. These might include:  Blood tests.  Urine tests.  Tests to make sure your heart is working properly.  Let your healthcare provider know if you think you might have a urinary tract infection. You will probably need to take an antibiotic to treat this before the surgery.  Two weeks before your surgery, stop using aspirin and non-steroidal anti-inflammatory drugs (NSAIDs) for pain relief. This includes prescription drugs and over-the-counter drugs such as ibuprofen and naproxen. Also stop taking vitamin E.  If you take blood-thinners, ask your healthcare provider when you should stop taking them.  Do not eat or drink for about 8 hours before your surgery.  You might be asked to shower or wash with a special antibacterial soap before the procedure.  Arrive at least an hour before the surgery, or whenever your surgeon recommends. This will give you time to check in and fill out any needed paperwork. PROCEDURE  The preparation:  You will change into a hospital gown.  You will be given an IV. A needle will be inserted in your arm. Medication will be able to flow directly into your body through this needle.  You might be given a sedative. This medication will help you relax.  You may be given a general anesthetic (a drug that will put you to sleep during the surgery). Or, you may  get a local anesthetic (part of your body will be numb, but you will remain awake).  A catheter (tube) will be put in your bladder to drain urine during and after surgery.  The procedure:  The surgeon will make a small incision in your lower back.  A tube will be inserted through the incision into your kidney.  Each  kidney stone is removed through this tube. Larger stones may first be broken up with a laser (a high-intensity light beam) or other tools.  If a kidney stone has already left the kidney, the surgeon would use a special tool to bring it back in. Then it would be removed through the tube.  After all the stones are taken out, a catheter will be put in. Fluid can build up around the kidney as it heals. The catheter lets this fluid drain out of the body.  A dressing (medicine and bandage) will be put on the incision area.  The surgery usually takes three to four hours. AFTER THE PROCEDURE  You will stay in a recovery area until the anesthesia has worn off. Your blood pressure and pulse will be checked often. You might be given more pain medication.  You will be moved to a hospital room for the rest of your stay.  The catheter that is taking urine out of your body will be taken out within 24 hours.  The day after your surgery, you should be able to walk around. Walking helps prevent blood clots (thick clumps that can block the flow of blood).  You may be asked to do some breathing exercises.  You will be able to have only liquids for a day or two.  Before you go home:  The catheter that is draining fluid from the kidney area is usually taken out.  You will be taught how to care for the incision. Be sure to ask how often the dressing should be changed and when it can get wet.  You also will be told what you should and should not do while your kidney and incisions heal. For instance, you may be urged to walk to prevent blood clots. Document Released: 01/12/2009 Document Revised: 06/09/2011 Document Reviewed: 01/12/2009 [facility] Patient Information 2014 [place], [place].   Information on my medicine - XARELTO (rivaroxaban)  This medication education was reviewed with me or my healthcare representative as part of my discharge preparation.  The pharmacist that spoke with me during my  hospital stay was:  Kara Mead, [place]? Xarelto was prescribed to treat blood clots that may have been found in the veins of your legs (deep vein thrombosis) or in your lungs (pulmonary embolism) and to reduce the risk of them occurring again.  What do you need to know about Xarelto? The starting dose is one 15 mg tablet taken TWICE daily with food for the FIRST 21 DAYS then on (enter date)    the dose is changed to one 20 mg tablet taken ONCE A DAY with your evening meal.  DO NOT stop taking Xarelto without talking to the health care provider who prescribed the medication.  Refill your prescription for 20 mg tablets before you run out.  After discharge, you should have regular check-up appointments with your healthcare provider that is prescribing your Xarelto.  In the future your dose may need to be changed if your kidney function changes by a significant amount.  What do you do if you miss a dose?  If you are taking Xarelto TWICE DAILY and you miss a dose, take it as soon as you remember. You may take two 15 mg tablets (total 30 mg) at the same time then resume your regularly scheduled 15 mg twice daily the next day.  If you are taking Xarelto ONCE DAILY and you miss a dose, take it as soon as you remember on the same day then continue your regularly scheduled once daily regimen the next day. Do not take two doses of Xarelto at the same time.   Important Safety Information Xarelto is a blood thinner medicine that can cause bleeding. You should call your healthcare provider right away if you experience any of the following:   Bleeding from an injury or your nose that does not stop.   Unusual colored urine (red or dark brown) or unusual colored stools (red or black).   Unusual bruising for unknown reasons.   A serious fall or if you hit your head (even if there is no bleeding).  Some medicines may interact with Xarelto and might increase  your risk of bleeding while on Xarelto. To help avoid this, consult your healthcare provider or pharmacist prior to using any new prescription or non-prescription medications, including herbals, vitamins, non-steroidal anti-inflammatory drugs (NSAIDs) and supplements.  This website has more information on Xarelto: [email or web address]

## 2013-06-09 NOTE — Progress Notes (Signed)
Patient ID: Tricia Perry, female   DOB: [birth year], 58 y.o.   MRN: [id number] 9 Days Post-Op  Subjective: 1) 9 days post right PCNL. Her urine remains clear this morning after resuming Heparin yesterday. She has no further right flank pain.   2) PE with onset of SOB on postop day #1. Still having SOB but sats are 98-100%.  She reports that the SOB is worse at night. She remains on Heparin with a clear urine.  Decision has been made not to place IVC filter and transition to Xarelto today. Per patient she had an elevated d-dimer about 2 weeks prior to the procedure and had a complete w/u with no other findings.   3) Superficial thrombophlebitis in a left leg varicosity with stable tenderness and erythema today. She is now on vancomycin. SCD's remain in place.   ROS: Negative except for SOB and the left leg tenderness  Objective: Vital signs in last 24 hours: Temp:  [98.3 F (36.8 C)-98.7 F (37.1 C)] 98.3 F (36.8 C) (03/12 0457) Pulse Rate:  [90-102] 90 (03/12 0457) Resp:  [18-20] 18 (03/12 0457) BP: (104-127)/(56-79) 104/56 mmHg (03/12 0457) SpO2:  [98 %-99 %] 98 % (03/12 0457)  Intake/Output from previous day: 03/11 0701 - 03/12 0700 In: 720 [P.O.:240; I.V.:230; IV Piggyback:250] Out: -  Intake/Output this shift:    General appearance: alert, no distress and morbidly obese Resp: clear to auscultation bilaterally Cardio: regular rate and rhythm GI: soft, non-tender; bowel sounds normal; no masses,  no organomegaly Extremities: She has stable erythema and tenderness of the medial left thigh and calf.   Lab Results:   Recent Labs  06/07/13 0428 06/08/13 0420  WBC 9.5 11.2*  HGB 11.1* 10.9*  HCT 33.5* 33.4*  PLT 314 310   BMET  Recent Labs  06/08/13 0420  NA 136*  K 3.9  CL 97  CO2 28  GLUCOSE 110*  BUN 14  CREATININE 0.63  CALCIUM 8.9   PT/INR  Recent Labs  06/07/13 0428 06/08/13 0420  LABPROT 11.8 12.5  INR 0.88 0.95   ABG No results found for this  basename: PHART, PCO2, PO2, HCO3,  in the last 72 hours  Studies/Results: No results found.  Anti-infectives: Anti-infectives   Start     Dose/Rate Route Frequency Ordered Stop   06/08/13 0600  vancomycin (VANCOCIN) 1,250 mg in sodium chloride 0.9 % 250 mL IVPB     1,250 mg 166.7 mL/hr over 90 Minutes Intravenous Every 12 hours 06/07/13 1752     06/07/13 1800  vancomycin (VANCOCIN) 2,500 mg in sodium chloride 0.9 % 500 mL IVPB     2,500 mg 250 mL/hr over 120 Minutes Intravenous  Once 06/07/13 1751 06/07/13 2021   05/31/13 2000  ciprofloxacin (CIPRO) tablet 500 mg  Status:  Discontinued     500 mg Oral 2 times daily 05/31/13 1442 06/02/13 1330   05/31/13 0730  ciprofloxacin (CIPRO) IVPB 400 mg  Status:  Discontinued     400 mg 200 mL/hr over 60 Minutes Intravenous On call 05/31/13 0724 05/31/13 0730   05/31/13 0724  ciprofloxacin (CIPRO) IVPB 400 mg  Status:  Discontinued     400 mg 200 mL/hr over 60 Minutes Intravenous 60 min pre-op 05/31/13 0724 05/31/13 1442      Current Facility-Administered Medications  Medication Dose Route Frequency Provider Last Rate Last Dose  . 0.9 %  sodium chloride infusion   Intravenous Continuous Irine Seal, MD 20 mL/hr at 06/08/13 0140    .  acetaminophen (TYLENOL) tablet 650 mg  650 mg Oral Q4H PRN Irine Seal, MD   650 mg at 06/04/13 0443  . bisacodyl (DULCOLAX) suppository 10 mg  10 mg Rectal Daily PRN Irine Seal, MD   10 mg at 06/02/13 2049  . diphenhydrAMINE (BENADRYL) injection 12.5-25 mg  12.5-25 mg Intravenous Q6H PRN Irine Seal, MD       Or  . diphenhydrAMINE (BENADRYL) 12.5 MG/5ML elixir 12.5-25 mg  12.5-25 mg Oral Q6H PRN Irine Seal, MD   12.5 mg at 06/05/13 2200  . docusate sodium (COLACE) capsule 100 mg  100 mg Oral BID Irine Seal, MD   100 mg at 06/08/13 2226  . heparin ADULT infusion 100 units/mL (25000 units/250 mL)  2,150 Units/hr Intravenous Continuous Leann Trefz Poindexter, RPH 21.5 mL/hr at 06/08/13 2227 2,150 Units/hr at 06/08/13  2227  . HYDROmorphone (DILAUDID) injection 2 mg  2 mg Intravenous Q2H PRN Irine Seal, MD   2 mg at 06/08/13 0123  . hyoscyamine (LEVSIN SL) SL tablet 0.125 mg  0.125 mg Oral Q4H PRN Irine Seal, MD   0.125 mg at 06/06/13 0535  . ipratropium-albuterol (DUONEB) 0.5-2.5 (3) MG/3ML nebulizer solution 3 mL  3 mL Nebulization Q2H PRN Robbie Lis, MD   3 mL at 06/08/13 2112  . levothyroxine (SYNTHROID, LEVOTHROID) tablet 175 mcg  175 mcg Oral QAC breakfast Irine Seal, MD   175 mcg at 06/08/13 1049  . lisinopril (PRINIVIL,ZESTRIL) tablet 40 mg  40 mg Oral q morning - 10a Irine Seal, MD   40 mg at 06/08/13 1049  . LORazepam (ATIVAN) tablet 1 mg  1 mg Oral Q6H PRN Irine Seal, MD   1 mg at 06/08/13 2226   Or  . LORazepam (ATIVAN) injection 1 mg  1 mg Intramuscular Q4H PRN Irine Seal, MD      . metFORMIN (GLUCOPHAGE) tablet 500 mg  500 mg Oral Q supper Irine Seal, MD   500 mg at 06/08/13 1921  . ondansetron (ZOFRAN) injection 4 mg  4 mg Intravenous Q4H PRN Irine Seal, MD   4 mg at 06/03/13 0208  . oxyCODONE (Oxy IR/ROXICODONE) immediate release tablet 15 mg  15 mg Oral QID Irine Seal, MD   15 mg at 06/08/13 2226  . oxyCODONE (Oxy IR/ROXICODONE) immediate release tablet 5 mg  5 mg Oral Q4H PRN Irine Seal, MD   5 mg at 06/08/13 0813  . patient's guide to using coumadin book   Does not apply Once Posey Rea, [facility]      . sodium chloride 0.9 % injection 10-40 mL  10-40 mL Intracatheter PRN Irine Seal, MD      . vancomycin (VANCOCIN) 1,250 mg in sodium chloride 0.9 % 250 mL IVPB  1,250 mg Intravenous Q12H Theodis Blaze, MD   1,250 mg at 06/09/13 0112    Assessment: s/p Procedure(s): NEPHROLITHOTOMY PERCUTANEOUS  Urine remains clear on heparin. She still has some SOB. The thrombophlebitis has not progressed but hasn't shown much improvement.   Plan: Ok to transition to Xarelto per medicine. Patient would like to ambulate but I will defer that decision to the medical service. I have nothing more to  add at this time and will defer discharge timing to medical service.      LOS: 9 days    Leiana Rund J 06/09/2013

## 2013-06-09 NOTE — Consult Note (Addendum)
[facility] PROGRESS NOTE  Tricia Perry VPX:[id number] DOB: 1955-09-12 DOA: 05/31/2013 PCP: Tammi Sou, MD  Assessment/Plan: 58 year old female with past medical history of asthma, hypertension, diabetes, kidney stone who presented to [facility] 05/31/13 for evaluation 2.3 cm stone found on CT scan. She subsequently underwent right percutaneous nephrolithotomy 05/31/2013. She was doing relatively ok post procedure but has developed sudden shortness found to have PE   Principal Problem:  Acute PE; CTA: Pulmonary embolus within the right middle lobe pulmonary artery with segmental extension. The RV LV ratio is within normal limits - developed hematuria on heparin; heparin held then resumed 3/8  - Dr. Doyle Askew discussed with Dr. Jeffie Pollock and vascular surgery, recommendation is to place IVC filter  - 3/11: hematuria seems to be resolving, Hg stable; hold IVC filter; Hg stable no more hematuria, changed to [place] 3/12; LE [place] negative for DVT Hematuria seem to be resolving   - renal US with no acute abnormalities that could explain etiology  - monitor on AC L thigh cellulitis, thrombophlebitis   - continue Vancomycin, could not appreciate improvement today and patient cannot tell, add Ceftriaxone. Contours drawn 3/12, if improving objectively narrow to oral abx and patient can be discharged 3/13. Right renal stone  - status post right perc nephrolithotomy  - manage per GU  Hypertension  - continue lisinopril  Diabetes mellitus, type II  - continue metformin   Procedures/Studies:  Abd 1 View 06/01/2013 No visualize retained stone fragments.  Ct Angio Chest Pe W/cm &/or Wo Cm 06/01/2013 Pulmonary embolus within the right middle lobe pulmonary artery with segmental extension. The RV LV ratio is within normal limits. Interstitial infiltrate likely reflecting component of pulmonary edema an infectious or inflammatory abnormality cannot be excluded.  Dg Chest Port 1 View 06/01/2013 Subsegmental bibasilar  atelectasis in a low volume chest.   Code Status: full Family Communication: d/w patient, her husband Disposition Plan: home when clinically improved    Consultants: Urology   Procedures:  DVT US 3/11  Antibiotics: Vancomycin 3/10<<<< Ceftriaxone 3/12 >> Plan for Doxy 3/13 if improvement  HPI/Subjective: alert  Objective: Filed Vitals:   06/09/13 0457  BP: 104/56  Pulse: 90  Temp: 98.3 F (36.8 C)  Resp: 18    Intake/Output Summary (Last 24 hours) at 06/09/13 1244 Last data filed at 06/08/13 1800  Gross per 24 hour  Intake    720 ml  Output      0 ml  Net    720 ml   Filed Weights   05/31/13 0725  Weight: 138.347 kg (305 lb)    Exam:   General:  alert  Cardiovascular: s1,s2 rrr  Respiratory: CTA BL  Abdomen: soft, nt, nd   Musculoskeletal: non pitting edema    Data Reviewed: Basic Metabolic Panel:  Recent Labs Lab 06/03/13 0411 06/04/13 0455 06/05/13 0522 06/06/13 0036 06/08/13 0420  NA 135* 135* 135* 138 136*  K 4.3 4.2 4.3 4.4 3.9  CL 97 95* 95* 97 97  CO2 27 32 30 28 28   GLUCOSE 155* 110* 115* 106* 110*  BUN 17 23 23  24* 14  CREATININE 0.92 1.10 1.04 0.77 0.63  CALCIUM 9.3 9.5 9.1 8.8 8.9   CBC:  Recent Labs Lab 06/05/13 0522 06/06/13 0036 06/07/13 0428 06/08/13 0420 06/09/13 0705  WBC 11.5* 9.6 9.5 11.2* 12.0*  HGB 11.2* 11.1* 11.1* 10.9* 11.0*  HCT 34.6* 34.2* 33.5* 33.4* 34.1*  MCV 91.1 91.7 89.8 90.3 90.0  PLT 287 258 314 310  294   BNP (last 3 results)  Recent Labs  04/20/13 1347  PROBNP 36.0   Scheduled Meds: . cefTRIAXone (ROCEPHIN)  IV  1 g Intravenous Q24H  . docusate sodium  100 mg Oral BID  . levothyroxine  175 mcg Oral QAC breakfast  . lisinopril  40 mg Oral q morning - 10a  . metFORMIN  500 mg Oral Q supper  . oxyCODONE  15 mg Oral QID  . rivaroxaban   Does not apply Once  . Rivaroxaban  15 mg Oral Once  . rivaroxaban  15 mg Oral Once  . [START ON 06/10/2013] Rivaroxaban  15 mg Oral BID WC  .  vancomycin  1,250 mg Intravenous Q12H   Continuous Infusions: . sodium chloride 20 mL/hr at 06/08/13 0140    Principal Problem:   Renal stone Active Problems:   Pulmonary embolism on right   Thrombophlebitis leg superficial  Time spent: 25 minutes   Marzetta Board MD [facility] Pager [phone]. If 7PM-7AM, please contact night-coverage at [email or web address], password [facility] 06/09/2013, 12:44 PM  LOS: 9 days

## 2013-06-09 NOTE — Progress Notes (Signed)
ANTICOAGULATION CONSULT NOTE - Initial Consult  Pharmacy Consult for Xarelto Indication: pulmonary embolus  Allergies  Allergen Reactions  . Codeine Itching    Back hurts  . Mold Extract [Trichophyton] Nausea And Vomiting and Other (See Comments)    Sneezing real bad   . Morphine Itching    back hurts    Patient Measurements: Height: 5\' 4"  (162.6 cm) Weight: 305 lb (138.347 kg) IBW/kg (Calculated) : 54.7  Vital Signs: Temp: 98.3 F (36.8 C) (03/12 0457) Temp src: Oral (03/12 0457) BP: 104/56 mmHg (03/12 0457) Pulse Rate: 90 (03/12 0457)  Labs:  Recent Labs  06/07/13 0428  06/08/13 0420 06/08/13 1040 06/09/13 0705  HGB 11.1*  --  10.9*  --  11.0*  HCT 33.5*  --  33.4*  --  34.1*  PLT 314  --  310  --  294  LABPROT 11.8  --  12.5  --  12.9  INR 0.88  --  0.95  --  0.99  HEPARINUNFRC  --   < > 0.55 0.41 0.76*  CREATININE  --   --  0.63  --   --   < > = values in this interval not displayed.  Estimated Creatinine Clearance: 106.6 ml/min (by C-G formula based on Cr of 0.63).   Medical History: Past Medical History  Diagnosis Date  . Insulin resistance   . Hypertension   . Hypothyroidism   . 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 DDD (degenerative disc disease)     Dr. [name] evaluated her and recommended pain mgmt MD.  She saw Dr. Selinda Orion for pain mgmt at one time.  Marland Kitchen GERD (gastroesophageal reflux disease)   . Insomnia   . Morbid obesity     BMI 49.  Gastric stapling surgery in distant past.  . Peripheral neuropathy     No old records to confirm pt's report.  Pt refuses to have more NCS b/c test is painful.  . Carpal tunnel syndrome     bilateral  . Fibromyalgia     questionable  . Nephrolithiasis     Lithotripsy 2002.  1.8 cm stone in right renal pelvis 02/2013--will likely get this removed by Dr. Jeffie Pollock  . Disequilibrium syndrome     MRI brain normal 2012  . DIZZINESS 04/12/2010    Qualifier: Diagnosis of  By: Anitra Lauth M.D.,  Brien Few   . Carpal tunnel syndrome on both sides 06/21/2010  . Mixed incontinence urge and stress   . Microscopic hematuria 2014    CT abd pelv showed 1.8 cm right renal pelvis stone.  Also had UA c/w UTI so abx given.   . Shoulder pain     HX OF BILATERAL SHOULDER SURGERIES - PT HAS VERY LIMITED ROM - ESPECIALLY RAISING HER ARMS  . Complication of anesthesia     STATES SHE WAS TOLD SHE WOKE UP DURING HER KNEE REPLACEMENT SURGERY AND HAD TO BE GIVEN MORE MEDICINE - NOT SURE IF SPINAL OR GENERAL ANESTHESIA  . Chronic venous insufficiency     with edema  and extensive varicose dz: Dr. Linus Mako is managing this, planning laser procedures.; A LOT OF LEG CRAMPS AND LEG STIFFNESS  . Asthma     NOT NEEDED INHALER IN OVER A YEAR  . Heart murmur     functional, w/u done; PALPITATIONS IN THE PAST  . Sleep apnea     PT DOES NOT HAVE MASK OR TUBING - JUST SLEEPS WITH HEAD  ELEVATED ON 2 PILLOWS   . Diabetes mellitus without complication     ORAL MEDICATION  . Urinary frequency     AND NOCTURIA - UP AT NIGHT 5 OR 6 TIMES TO BATHROOM  . Renal stone 05/31/2013    Medications:  Scheduled:  . cefTRIAXone (ROCEPHIN)  IV  1 g Intravenous Q24H  . docusate sodium  100 mg Oral BID  . levothyroxine  175 mcg Oral QAC breakfast  . lisinopril  40 mg Oral q morning - 10a  . metFORMIN  500 mg Oral Q supper  . oxyCODONE  15 mg Oral QID  . Rivaroxaban  15 mg Oral Once  . rivaroxaban  15 mg Oral Once  . [START ON 06/10/2013] Rivaroxaban  15 mg Oral BID WC  . vancomycin  1,250 mg Intravenous Q12H   Infusions:  . sodium chloride 20 mL/hr at 06/08/13 0140    Assessment: 58 yo female known to pharmacy for dosing IV heparin now to transition to PO Xarelto. SCr and CBC stable  Goal of Therapy:  monitoring CBC and renal function Monitor platelets by anticoagulation protocol: Yes   Plan:  1) D/C IV heparin 2) Xarelto 15mg  PO BID x 21 days then 3) Xarelto 20mg  once daily thereafter 4) Will  educate patient   Adrian Saran, PharmD, BCPS Pager [phone] 06/09/2013 12:17 PM

## 2013-06-10 ENCOUNTER — Inpatient Hospital Stay (HOSPITAL_COMMUNITY): Payer: 59

## 2013-06-10 LAB — CBC
HEMATOCRIT: 31.4 % — AB (ref 36.0–46.0)
Hemoglobin: 10.6 g/dL — ABNORMAL LOW (ref 12.0–15.0)
MCH: 30.7 pg (ref 26.0–34.0)
MCHC: 33.8 g/dL (ref 30.0–36.0)
MCV: 91 fL (ref 78.0–100.0)
Platelets: 312 10*3/uL (ref 150–400)
RBC: 3.45 MIL/uL — ABNORMAL LOW (ref 3.87–5.11)
RDW: 14.3 % (ref 11.5–15.5)
WBC: 10.9 10*3/uL — ABNORMAL HIGH (ref 4.0–10.5)

## 2013-06-10 MED ORDER — IOHEXOL 300 MG/ML  SOLN
100.0000 mL | Freq: Once | INTRAMUSCULAR | Status: AC | PRN
  Administered 2013-06-10: 100 mL via INTRAVENOUS

## 2013-06-10 NOTE — Progress Notes (Signed)
ANTIBIOTIC CONSULT NOTE - Follow Up  Pharmacy Consult for Vancomycin Indication: Cellulitis  Allergies  Allergen Reactions  . Codeine Itching    Back hurts  . Mold Extract [Trichophyton] Nausea And Vomiting and Other (See Comments)    Sneezing real bad   . Morphine Itching    back hurts    Patient Measurements: Height: 5\' 4"  (162.6 cm) Weight: 305 lb (138.347 kg) IBW/kg (Calculated) : 54.7  Vital Signs: Temp: 98.2 F (36.8 C) (03/13 1035) Temp src: Oral (03/13 1035) BP: 135/69 mmHg (03/13 1035) Pulse Rate: 107 (03/13 1035) Intake/Output from previous day: 03/12 0701 - 03/13 0700 In: 1020 [P.O.:480; I.V.:240; IV Piggyback:300] Out: -  Intake/Output from this shift: Total I/O In: 240 [P.O.:240] Out: -   Labs:  Recent Labs  06/08/13 0420 06/09/13 0705 06/10/13 0357  WBC 11.2* 12.0* 10.9*  HGB 10.9* 11.0* 10.6*  PLT 310 294 312  CREATININE 0.63  --   --    Estimated Creatinine Clearance: 106.6 ml/min (by C-G formula based on Cr of 0.63). No results found for this basename: VANCOTROUGH, VANCOPEAK, VANCORANDOM, GENTTROUGH, GENTPEAK, GENTRANDOM, TOBRATROUGH, TOBRAPEAK, TOBRARND, AMIKACINPEAK, AMIKACINTROU, AMIKACIN,  in the last 72 hours   Microbiology: No results found for this or any previous visit (from the past 720 hour(s)).  Medical History: Past Medical History  Diagnosis Date  . Insulin resistance   . Hypertension   . Hypothyroidism   . 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 DDD (degenerative disc disease)     Dr. [name] evaluated her and recommended pain mgmt MD.  She saw Dr. Selinda Orion for pain mgmt at one time.  Marland Kitchen GERD (gastroesophageal reflux disease)   . Insomnia   . Morbid obesity     BMI 49.  Gastric stapling surgery in distant past.  . Peripheral neuropathy     No old records to confirm pt's report.  Pt refuses to have more NCS b/c test is painful.  . Carpal tunnel syndrome     bilateral  . Fibromyalgia     questionable   . Nephrolithiasis     Lithotripsy 2002.  1.8 cm stone in right renal pelvis 02/2013--will likely get this removed by Dr. Jeffie Pollock  . Disequilibrium syndrome     MRI brain normal 2012  . DIZZINESS 04/12/2010    Qualifier: Diagnosis of  By: Anitra Lauth M.D., Brien Few   . Carpal tunnel syndrome on both sides 06/21/2010  . Mixed incontinence urge and stress   . Microscopic hematuria 2014    CT abd pelv showed 1.8 cm right renal pelvis stone.  Also had UA c/w UTI so abx given.   . Shoulder pain     HX OF BILATERAL SHOULDER SURGERIES - PT HAS VERY LIMITED ROM - ESPECIALLY RAISING HER ARMS  . Complication of anesthesia     STATES SHE WAS TOLD SHE WOKE UP DURING HER KNEE REPLACEMENT SURGERY AND HAD TO BE GIVEN MORE MEDICINE - NOT SURE IF SPINAL OR GENERAL ANESTHESIA  . Chronic venous insufficiency     with edema  and extensive varicose dz: Dr. Linus Mako is managing this, planning laser procedures.; A LOT OF LEG CRAMPS AND LEG STIFFNESS  . Asthma     NOT NEEDED INHALER IN OVER A YEAR  . Heart murmur     functional, w/u done; PALPITATIONS IN THE PAST  . Sleep apnea     PT DOES NOT HAVE MASK OR TUBING - JUST SLEEPS WITH HEAD  ELEVATED ON 2 PILLOWS   . Diabetes mellitus without complication     ORAL MEDICATION  . Urinary frequency     AND NOCTURIA - UP AT NIGHT 5 OR 6 TIMES TO BATHROOM  . Renal stone 05/31/2013    Medications:  Scheduled:  . cefTRIAXone (ROCEPHIN)  IV  1 g Intravenous Q24H  . docusate sodium  100 mg Oral BID  . levothyroxine  175 mcg Oral QAC breakfast  . lisinopril  40 mg Oral q morning - 10a  . metFORMIN  500 mg Oral Q supper  . oxyCODONE  15 mg Oral QID  . Rivaroxaban  15 mg Oral BID WC  . vancomycin  1,250 mg Intravenous Q12H   Infusions:  . sodium chloride 20 mL/hr at 06/08/13 0140   Assessment: 58 yr female known to pharmacy from heparin dosing for + PE. 3/10 with new complaint of tenderness and redness on the left inner thigh and vancomycin started for  cellulitis. 3/12 worsening cellulitis and Rocephin started per Md. At start of vanc, WBC = 9.5, Temp = 98.2 F, Scr = 0.77 with CrCl (n) ~ 91 ml/min  Now Day #4 Vancomycin 1250mg  IV q12  Afebrile  Renal function stable  WBC slightly up  Goal of Therapy:  Vancomycin trough level 10-15 mcg/ml  Plan:  1) Continue vancomycin 1250mg  IV q12 for now 2) Will check a vanc trough prior to next dose at 2am. 3) Should consideration be given to changing Rocephin to antibiotic with pseudomonas and anaerobic coverage such as Zosyn?   Adrian Saran, PharmD, BCPS Pager [phone] 06/10/2013 1:39 PM

## 2013-06-10 NOTE — Progress Notes (Signed)
The patient is resting fine at this time. She just voided with no problem at the bedside commode. Fresh ice was put in the ice packs and applied to the patients leg. The patient is still complaining of pain 8 out of 10. She has her legs elevated and SCD on the right leg not the left, due to the inflammation and clots. Lacretia Leigh, SN, RCC

## 2013-06-10 NOTE — Progress Notes (Signed)
Patient is in bed at this time, waiting for lunch to be delivered. The patient tolerated her morning medications well with no additional nausea. The patient also tolerated transport and CT scan well this morning. She took a shower and washed her hair with minimal assistance. The patient states that her leg is still in pain, although the ice packs are helping with pain, swelling and heat at the sight. Tricia Perry, SN, RCC

## 2013-06-10 NOTE — Progress Notes (Signed)
10 Days Post-Op Subjective: Patient reports a bit of nausea earlier this morning but she has finished her complete breakfast without difficulty. She's had no gross hematuria  Objective: Vital signs in last 24 hours: Temp:  [98.4 F (36.9 C)-98.6 F (37 C)] 98.6 F (37 C) (03/13 0539) Pulse Rate:  [92-105] 96 (03/13 0539) Resp:  [16-18] 18 (03/13 0539) BP: (124-139)/(59-66) 127/59 mmHg (03/13 0539) SpO2:  [97 %-100 %] 97 % (03/13 0539)  Intake/Output from previous day: 03/12 0701 - 03/13 0700 In: 1020 [P.O.:480; I.V.:240; IV Piggyback:300] Out: -  Intake/Output this shift:    Physical Exam:  Constitutional: Vital signs reviewed. WD WN in NAD   Eyes: PERRL, No scleral icterus.   Pulmonary/Chest: Normal effort  Extremities: Erythema and induration of left medial thigh. The edges of this erythema seemed to be approximated to the outlined area.  Lab Results:  Recent Labs  06/08/13 0420 06/09/13 0705 06/10/13 0357  HGB 10.9* 11.0* 10.6*  HCT 33.4* 34.1* 31.4*   BMET  Recent Labs  06/08/13 0420  NA 136*  K 3.9  CL 97  CO2 28  GLUCOSE 110*  BUN 14  CREATININE 0.63  CALCIUM 8.9    Recent Labs  06/08/13 0420 06/09/13 0705  INR 0.95 0.99   No results found for this basename: LABURIN,  in the last 72 hours Results for orders placed in visit on 10/28/12  URINE CULTURE     Status: None   Collection Time    10/28/12  1:00 PM      Result Value Ref Range Status   Colony Count 6,000 COLONIES/ML   Final   Organism ID, Bacteria Insignificant Growth   Final    Studies/Results: No results found.  Assessment/Plan:    Postop from percutaneous nephrolithotomy. She has tolerated anticoagulation without significant bleeding. Superficial thrombophlebitis and PEs are being treated by the hospitalist service.    At this point, no significant active urologic process is one on. We'll continue to follow during her hospitalization.   LOS: 10 days   Franchot Gallo  M 06/10/2013, 7:59 AM

## 2013-06-10 NOTE — Consult Note (Signed)
[facility] PROGRESS NOTE  Tricia Perry BOF:[id number] DOB: 10-28-1955 DOA: 05/31/2013 PCP: Tammi Sou, MD  Assessment/Plan: 58 year old female with past medical history of asthma, hypertension, diabetes, kidney stone who presented to [facility] 05/31/13 for evaluation 2.3 cm stone found on CT scan. She subsequently underwent right percutaneous nephrolithotomy 05/31/2013. She was doing relatively ok post procedure but has developed sudden shortness found to have PE   Principal Problem:  L thigh cellulitis, thrombophlebitis  - on Vancomycin and Ceftriaxone, worsening cellulitic appearance this morning. Will need imaging for further characterization, may need surgical evaluation Acute PE; CTA: Pulmonary embolus within the right middle lobe pulmonary artery with segmental extension. The RV LV ratio is within normal limits - developed hematuria on heparin; heparin held then resumed 3/8  - Dr. Doyle Askew discussed with Dr. Jeffie Pollock and vascular surgery, recommendation is to place IVC filter  - 3/11: hematuria seems to be resolving, Hg stable; hold IVC filter; Hg stable no more hematuria, changed to [place] 3/12; LE [place] negative for DVT - stable Hematuria seem to be resolving   Right renal stone - status post right perc nephrolithotomy  Hypertension - continue lisinopril  Diabetes mellitus, type II - continue metformin   Procedures/Studies:  Abd 1 View 06/01/2013 No visualize retained stone fragments.  Ct Angio Chest Pe W/cm &/or Wo Cm 06/01/2013 Pulmonary embolus within the right middle lobe pulmonary artery with segmental extension. The RV LV ratio is within normal limits. Interstitial infiltrate likely reflecting component of pulmonary edema an infectious or inflammatory abnormality cannot be excluded.  Dg Chest Port 1 View 06/01/2013 Subsegmental bibasilar atelectasis in a low volume chest.   Code Status: full Family Communication: d/w patient Disposition Plan: home when clinically improved    Consultants: Urology   Procedures:  DVT US 3/11  Antibiotics: Vancomycin 3/10<<<< Ceftriaxone 3/12 >>  HPI/Subjective: alert  Objective: Filed Vitals:   06/10/13 0539  BP: 127/59  Pulse: 96  Temp: 98.6 F (37 C)  Resp: 18    Intake/Output Summary (Last 24 hours) at 06/10/13 0730 Last data filed at 06/09/13 1900  Gross per 24 hour  Intake   1020 ml  Output      0 ml  Net   1020 ml   Filed Weights   05/31/13 0725  Weight: 138.347 kg (305 lb)    Exam:   General:  alert  Cardiovascular: s1,s2 rrr  Respiratory: CTA BL  Abdomen: soft, nt, nd   Musculoskeletal: non pitting edema; cellulitis left thigh worsening, outside boundaries drawn 3/11  Data Reviewed: Basic Metabolic Panel:  Recent Labs Lab 06/04/13 0455 06/05/13 0522 06/06/13 0036 06/08/13 0420  NA 135* 135* 138 136*  K 4.2 4.3 4.4 3.9  CL 95* 95* 97 97  CO2 32 30 28 28   GLUCOSE 110* 115* 106* 110*  BUN 23 23 24* 14  CREATININE 1.10 1.04 0.77 0.63  CALCIUM 9.5 9.1 8.8 8.9   CBC:  Recent Labs Lab 06/06/13 0036 06/07/13 0428 06/08/13 0420 06/09/13 0705 06/10/13 0357  WBC 9.6 9.5 11.2* 12.0* 10.9*  HGB 11.1* 11.1* 10.9* 11.0* 10.6*  HCT 34.2* 33.5* 33.4* 34.1* 31.4*  MCV 91.7 89.8 90.3 90.0 91.0  PLT 258 314 310 294 312   BNP (last 3 results)  Recent Labs  04/20/13 1347  PROBNP 36.0   Scheduled Meds: . cefTRIAXone (ROCEPHIN)  IV  1 g Intravenous Q24H  . docusate sodium  100 mg Oral BID  . levothyroxine  175 mcg Oral QAC breakfast  .  lisinopril  40 mg Oral q morning - 10a  . metFORMIN  500 mg Oral Q supper  . oxyCODONE  15 mg Oral QID  . Rivaroxaban  15 mg Oral BID WC  . vancomycin  1,250 mg Intravenous Q12H   Continuous Infusions: . sodium chloride 20 mL/hr at 06/08/13 0140    Principal Problem:   Renal stone Active Problems:   Pulmonary embolism on right   Thrombophlebitis leg superficial  Time spent: 25 minutes   Marzetta Board MD [facility] Pager [phone]. If 7PM-7AM, please contact night-coverage at [email or web address], password [facility] 06/10/2013, 7:30 AM  LOS: 10 days

## 2013-06-10 NOTE — Progress Notes (Signed)
I have reviewed all documentation from student nurse. 

## 2013-06-11 LAB — CBC
HCT: 30.8 % — ABNORMAL LOW (ref 36.0–46.0)
Hemoglobin: 9.8 g/dL — ABNORMAL LOW (ref 12.0–15.0)
MCH: 29.2 pg (ref 26.0–34.0)
MCHC: 31.8 g/dL (ref 30.0–36.0)
MCV: 91.7 fL (ref 78.0–100.0)
PLATELETS: 285 10*3/uL (ref 150–400)
RBC: 3.36 MIL/uL — AB (ref 3.87–5.11)
RDW: 14.5 % (ref 11.5–15.5)
WBC: 10.4 10*3/uL (ref 4.0–10.5)

## 2013-06-11 LAB — COMPREHENSIVE METABOLIC PANEL
ALBUMIN: 2.6 g/dL — AB (ref 3.5–5.2)
ALT: 40 U/L — AB (ref 0–35)
AST: 30 U/L (ref 0–37)
Alkaline Phosphatase: 142 U/L — ABNORMAL HIGH (ref 39–117)
BILIRUBIN TOTAL: 0.2 mg/dL — AB (ref 0.3–1.2)
BUN: 16 mg/dL (ref 6–23)
CO2: 27 meq/L (ref 19–32)
CREATININE: 0.75 mg/dL (ref 0.50–1.10)
Calcium: 8.9 mg/dL (ref 8.4–10.5)
Chloride: 100 mEq/L (ref 96–112)
GFR calc Af Amer: >90 mL/min (ref >90)
GFR calc non Af Amer: >90 mL/min (ref >90)
Glucose, Bld: 125 mg/dL — ABNORMAL HIGH (ref 70–99)
Potassium: 4.3 mEq/L (ref 3.7–5.3)
SODIUM: 137 meq/L (ref 137–147)
Total Protein: 6.1 g/dL (ref 6.0–8.3)

## 2013-06-11 LAB — VANCOMYCIN, TROUGH: Vancomycin Tr: 11.6 ug/mL (ref 10.0–20.0)

## 2013-06-11 MED ORDER — SULFAMETHOXAZOLE-TMP DS 800-160 MG PO TABS: 1.0000 | ORAL_TABLET | Freq: Two times a day (BID) | ORAL | Status: DC

## 2013-06-11 MED ORDER — RIVAROXABAN 20 MG PO TABS: 20.0000 mg | ORAL_TABLET | Freq: Every day | ORAL | Status: DC

## 2013-06-11 MED ORDER — AMOXICILLIN-POT CLAVULANATE 875-125 MG PO TABS
1.0000 | ORAL_TABLET | Freq: Two times a day (BID) | ORAL | Status: DC
  Administered 2013-06-11: 1 via ORAL
  Filled 2013-06-11 (×2): qty 1

## 2013-06-11 MED ORDER — RIVAROXABAN (XARELTO) VTE STARTER PACK (15 & 20 MG): ORAL_TABLET | ORAL | Status: DC

## 2013-06-11 MED ORDER — SULFAMETHOXAZOLE-TMP DS 800-160 MG PO TABS
1.0000 | ORAL_TABLET | Freq: Two times a day (BID) | ORAL | Status: DC
  Administered 2013-06-11: 1 via ORAL
  Filled 2013-06-11 (×2): qty 1

## 2013-06-11 MED ORDER — AMOXICILLIN-POT CLAVULANATE 875-125 MG PO TABS: 1.0000 | ORAL_TABLET | Freq: Two times a day (BID) | ORAL | Status: DC

## 2013-06-11 NOTE — Progress Notes (Signed)
ANTIBIOTIC CONSULT NOTE - Follow Up  Pharmacy Consult for Vancomycin Indication: Cellulitis  Allergies  Allergen Reactions  . Codeine Itching    Back hurts  . Mold Extract [Trichophyton] Nausea And Vomiting and Other (See Comments)    Sneezing real bad   . Morphine Itching    back hurts    Patient Measurements: Height: 5\' 4"  (162.6 cm) Weight: 305 lb (138.347 kg) IBW/kg (Calculated) : 54.7  Vital Signs: Temp: 98.6 F (37 C) (03/13 2200) Temp src: Oral (03/13 2200) BP: 113/57 mmHg (03/13 2200) Pulse Rate: 102 (03/13 2200) Intake/Output from previous day: 03/13 0701 - 03/14 0700 In: 1490 [P.O.:720; I.V.:220; IV Piggyback:550] Out: -  Intake/Output from this shift: Total I/O In: 490 [P.O.:240; IV Piggyback:250] Out: -   Labs:  Recent Labs  06/08/13 0420 06/09/13 0705 06/10/13 0357  WBC 11.2* 12.0* 10.9*  HGB 10.9* 11.0* 10.6*  PLT 310 294 312  CREATININE 0.63  --   --    Estimated Creatinine Clearance: 106.6 ml/min (by C-G formula based on Cr of 0.63).  Recent Labs  06/11/13 0052  VANCOTROUGH 11.6     Microbiology: No results found for this or any previous visit (from the past 720 hour(s)).  Medical History: Past Medical History  Diagnosis Date  . Insulin resistance   . Hypertension   . Hypothyroidism   . 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 DDD (degenerative disc disease)     Dr. [name] evaluated her and recommended pain mgmt MD.  She saw Dr. Selinda Orion for pain mgmt at one time.  Marland Kitchen GERD (gastroesophageal reflux disease)   . Insomnia   . Morbid obesity     BMI 49.  Gastric stapling surgery in distant past.  . Peripheral neuropathy     No old records to confirm pt's report.  Pt refuses to have more NCS b/c test is painful.  . Carpal tunnel syndrome     bilateral  . Fibromyalgia     questionable  . Nephrolithiasis     Lithotripsy 2002.  1.8 cm stone in right renal pelvis 02/2013--will likely get this removed by Dr. Jeffie Pollock   . Disequilibrium syndrome     MRI brain normal 2012  . DIZZINESS 04/12/2010    Qualifier: Diagnosis of  By: Anitra Lauth M.D., Brien Few   . Carpal tunnel syndrome on both sides 06/21/2010  . Mixed incontinence urge and stress   . Microscopic hematuria 2014    CT abd pelv showed 1.8 cm right renal pelvis stone.  Also had UA c/w UTI so abx given.   . Shoulder pain     HX OF BILATERAL SHOULDER SURGERIES - PT HAS VERY LIMITED ROM - ESPECIALLY RAISING HER ARMS  . Complication of anesthesia     STATES SHE WAS TOLD SHE WOKE UP DURING HER KNEE REPLACEMENT SURGERY AND HAD TO BE GIVEN MORE MEDICINE - NOT SURE IF SPINAL OR GENERAL ANESTHESIA  . Chronic venous insufficiency     with edema  and extensive varicose dz: Dr. Linus Mako is managing this, planning laser procedures.; A LOT OF LEG CRAMPS AND LEG STIFFNESS  . Asthma     NOT NEEDED INHALER IN OVER A YEAR  . Heart murmur     functional, w/u done; PALPITATIONS IN THE PAST  . Sleep apnea     PT DOES NOT HAVE MASK OR TUBING - JUST SLEEPS WITH HEAD ELEVATED ON 2 PILLOWS   . Diabetes mellitus without complication  ORAL MEDICATION  . Urinary frequency     AND NOCTURIA - UP AT NIGHT 5 OR 6 TIMES TO BATHROOM  . Renal stone 05/31/2013    Medications:  Scheduled:  . cefTRIAXone (ROCEPHIN)  IV  1 g Intravenous Q24H  . docusate sodium  100 mg Oral BID  . levothyroxine  175 mcg Oral QAC breakfast  . lisinopril  40 mg Oral q morning - 10a  . metFORMIN  500 mg Oral Q supper  . oxyCODONE  15 mg Oral QID  . Rivaroxaban  15 mg Oral BID WC  . vancomycin  1,250 mg Intravenous Q12H   Infusions:  . sodium chloride 20 mL/hr at 06/08/13 0140   Assessment: 58 yr female known to pharmacy from heparin dosing for + PE. 3/10 with new complaint of tenderness and redness on the left inner thigh and vancomycin started for cellulitis. 3/12 worsening cellulitis and Rocephin started per Md. At start of vanc, WBC = 9.5, Temp = 98.2 F, Scr = 0.77 with CrCl (n) ~  91 ml/min  Day #5 Vancomycin 1250mg  IV q12  Afebrile  Renal function stable  WBC 10.9  Vancomycin Trough = 11.6 mcg/ml (therapeutic)  Goal of Therapy:  Vancomycin trough level 10-15 mcg/ml  Plan:  1) Continue vancomycin 1250mg  IV q12 for now 2) Should consideration be given to changing Rocephin to antibiotic with pseudomonas and anaerobic coverage such as Zosyn?   Leone Haven, PharmD  06/11/2013 1:41 AM

## 2013-06-11 NOTE — Progress Notes (Signed)
11 Days Post-Op Subjective: Patient reports no new concerns. She had uneventful evening. She has voided without hematuria. She has no new complaints  Objective: Vital signs in last 24 hours: Temp:  [98.2 F (36.8 C)-98.6 F (37 C)] 98.3 F (36.8 C) (03/14 0527) Pulse Rate:  [88-107] 88 (03/14 0527) Resp:  [20] 20 (03/14 0527) BP: (105-135)/(52-69) 105/52 mmHg (03/14 0527) SpO2:  [96 %-100 %] 99 % (03/14 0527)  Intake/Output from previous day: 03/13 0701 - 03/14 0700 In: 1970 [P.O.:960; I.V.:460; IV Piggyback:550] Out: -  Intake/Output this shift:    Physical Exam:  Constitutional: Vital signs reviewed. WD WN in NAD   Eyes: PERRL, No scleral icterus.   Cardiovascular: RRR Pulmonary/Chest: Normal effort Abdominal: Soft. Non-tender, non-distended, bowel sounds are normal, no masses, organomegaly, or guarding present.  Genitourinary: Extremities: No cyanosis or edema   Lab Results:  Recent Labs  06/09/13 0705 06/10/13 0357 06/11/13 0344  HGB 11.0* 10.6* 9.8*  HCT 34.1* 31.4* 30.8*   BMET  Recent Labs  06/11/13 0344  NA 137  K 4.3  CL 100  CO2 27  GLUCOSE 125*  BUN 16  CREATININE 0.75  CALCIUM 8.9    Recent Labs  06/09/13 0705  INR 0.99   No results found for this basename: LABURIN,  in the last 72 hours Results for orders placed in visit on 10/28/12  URINE CULTURE     Status: None   Collection Time    10/28/12  1:00 PM      Result Value Ref Range Status   Colony Count 6,000 COLONIES/ML   Final   Organism ID, Bacteria Insignificant Growth   Final    Studies/Results: Ct Femur Left W Contrast  06/10/2013   CLINICAL DATA:  Worsening cellulitis despite IV antibiotics.  EXAM: CT OF THE LEFT FEMUR WITH CONTRAST  TECHNIQUE: Multidetector CT imaging of the bilateral lower extremities was performed according to the standard protocol following intravenous contrast administration. Sagittal and coronal plane reformatted images were reconstructed from the axial  CT dat  CONTRAST:  161mL OMNIPAQUE IOHEXOL 300 MG/ML  SOLN  COMPARISON:  None.  FINDINGS: In the upper to mid thigh there is abnormal subcutaneous edema superficial to the sartorius muscle. Further caudad in the mid and lower thigh, the subcutaneous edema is primarily the medial. There are tortuous medial subcutaneous venous varicosities as well, shown on image 81 of series 604, and some of the inflammation is in the vicinity of these varicosities. There is evidence of superficial thrombophlebitis in the medial calf on image 346 of series 4.  The subcutaneous edema does extend into the calf medially. This extends beyond the inferior margin of this scan. If dedicated CT imaging of the calf is warranted, a separate request for CT imaging of the calf would be required.  Within the thigh and visualized upper calf, no abscess is identified. No CT findings of osteomyelitis. There is osteoarthritis of the knee. No knee effusion to suggest septic joint. 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 No compelling CT findings of myositis or fasciitis, although MRI can provide greater sensitivity in assessing for intramuscular edema.  IMPRESSION: 1. Cellulitis involving the anterior upper thigh and extending into the medial mid and lower thigh, and into the medial calf. No abscess or osteomyelitis identified. No knee joint or hip joint effusion observed. The subcutaneous edema does extend distally in the calf. 2. Extensive subcutaneous venous varicosities in the medial thigh and calf, with evidence of superficial thrombophlebitis  in the medial calf within one of these varices.   Electronically Signed   By: Sherryl Barters M.D.   On: 06/10/2013 10:32    Assessment/Plan:   Discharge when appropriate from the hospitalist perspective for ongoing anticoagulation.   LOS: 11 days   Betsy Rosello S 06/11/2013, 8:24 AM

## 2013-06-11 NOTE — Progress Notes (Signed)
   CARE MANAGEMENT NOTE 06/11/2013  Patient:  Tricia Perry, Tricia Perry   Account Number:  [id number]  Date Initiated:  06/06/2013  Documentation initiated by:  Gabriel Earing  Subjective/Objective Assessment:   pt admitted with cco renal stone, with DVT     Action/Plan:   from home   Anticipated DC Date:  06/09/2013   Anticipated DC Plan:  [place]  CM consult  Medication Assistance      Choice offered to / List presented to:             Status of service:  Completed, signed off Medicare Important Message given?   (If response is "NO", the following Medicare IM given date fields will be blank) Date Medicare IM given:   Date Additional Medicare IM given:    Discharge Disposition:  HOME/SELF CARE  Per UR Regulation:  Reviewed for med. necessity/level of care/duration of stay  If discussed at [place] of Stay Meetings, dates discussed:    Comments:  06/11/2013 1230 NCM spoke to pt and she does have insurance. Pt had Xarelto info package in her room with 30 day free trial card. Provided pt with Xarelto copay card. Explained to pt she would have to call to activate her cards. Contacted pt's pharmacy [facility] on [address], # [phone] and they do have in stock. Jonnie Finner RN CCM Case Mgmt phone [phone]

## 2013-06-11 NOTE — Consult Note (Signed)
[facility] PROGRESS NOTE  Tricia Perry QQV:[id number] DOB: [birth year] DOA: 05/31/2013 PCP: Tammi Sou, MD  Assessment/Plan: 58 year old female with past medical history of asthma, hypertension, diabetes, kidney stone who presented to [facility] 05/31/13 for evaluation 2.3 cm stone found on CT scan. She subsequently underwent right percutaneous nephrolithotomy 05/31/2013. She was doing relatively ok post procedure but has developed sudden shortness found to have PE   Principal Problem:  L thigh cellulitis, thrombophlebitis  - on Vancomycin and Ceftriaxone, worsening 3/13 (but antibiotics for less than 24 h then), CT thigh without fluid collection/abscesses. Case discussed with vascular surgery and no surgical indications. Cellulitis improving 3/14 with receeding erythema, resolution of pain, WBC within normal limits. Will switch treatment to a 2 drug regimen with Augmentin and Bactrim for 10 additional days. Patient will call her PCP on Monday for a follow up appointment mid-next week to monitor progress.  Acute PE; CTA: Pulmonary embolus within the right middle lobe pulmonary artery with segmental extension. The RV LV ratio is within normal limits. Developed hematuria on heparin; heparin held then resumed 3/8, Dr. Doyle Askew discussed with Dr. Jeffie Pollock and vascular surgery, recommendation is to place IVC filter, but since hematuria resolved it was decided to hold IVC filter; Hg stable no more hematuria, changed to [place] 3/12; LE [place] negative for DVT Hematuria seem to be resolving   Right renal stone - status post right perc nephrolithotomy  Hypertension - continue lisinopril  Diabetes mellitus, type II - continue metformin   Procedures/Studies:  Abd 1 View 06/01/2013 No visualize retained stone fragments.  Ct Angio Chest Pe W/cm &/or Wo Cm 06/01/2013 Pulmonary embolus within the right middle lobe pulmonary artery with segmental extension. The RV LV ratio is within normal limits. Interstitial infiltrate likely  reflecting component of pulmonary edema an infectious or inflammatory abnormality cannot be excluded.  Dg Chest Port 1 View 06/01/2013 Subsegmental bibasilar atelectasis in a low volume chest.   Code Status: full Family Communication: d/w patient Disposition Plan: OK to go home today with close outpatient PCP follow up.   Procedures:  DVT US 3/11  Antibiotics: Vancomycin 3/10<<<<3/14 Ceftriaxone 3/12 >>3/14 Augmentin 3/14>> Bactrim 3/14 >>  HPI/Subjective: alert  Objective: Filed Vitals:   06/11/13 0527  BP: 105/52  Pulse: 88  Temp: 98.3 F (36.8 C)  Resp: 20    Intake/Output Summary (Last 24 hours) at 06/11/13 0913 Last data filed at 06/11/13 0600  Gross per 24 hour  Intake   1730 ml  Output      0 ml  Net   1730 ml   Filed Weights   05/31/13 0725  Weight: 138.347 kg (305 lb)    Exam:   General:  alert  Cardiovascular: s1,s2 rrr  Respiratory: CTA BL  Abdomen: soft, nt, nd   Musculoskeletal: non pitting edema; cellulitis left thigh improving, no pain.   Data Reviewed: Basic Metabolic Panel:  Recent Labs Lab 06/05/13 0522 06/06/13 0036 06/08/13 0420 06/11/13 0344  NA 135* 138 136* 137  K 4.3 4.4 3.9 4.3  CL 95* 97 97 100  CO2 30 28 28 27   GLUCOSE 115* 106* 110* 125*  BUN 23 24* 14 16  CREATININE 1.04 0.77 0.63 0.75  CALCIUM 9.1 8.8 8.9 8.9   CBC:  Recent Labs Lab 06/07/13 0428 06/08/13 0420 06/09/13 0705 06/10/13 0357 06/11/13 0344  WBC 9.5 11.2* 12.0* 10.9* 10.4  HGB 11.1* 10.9* 11.0* 10.6* 9.8*  HCT 33.5* 33.4* 34.1* 31.4* 30.8*  MCV 89.8 90.3 90.0  91.0 91.7  PLT 314 310 294 312 285   BNP (last 3 results)  Recent Labs  04/20/13 1347  PROBNP 36.0   Scheduled Meds: . amoxicillin-clavulanate  1 tablet Oral BID  . docusate sodium  100 mg Oral BID  . levothyroxine  175 mcg Oral QAC breakfast  . lisinopril  40 mg Oral q morning - 10a  . metFORMIN  500 mg Oral Q supper  . oxyCODONE  15 mg Oral QID  . Rivaroxaban  15 mg Oral  BID WC  . sulfamethoxazole-trimethoprim  1 tablet Oral Q12H   Continuous Infusions: . sodium chloride 20 mL/hr at 06/08/13 0140    Principal Problem:   Renal stone Active Problems:   Pulmonary embolism on right   Thrombophlebitis leg superficial  Time spent: 25 minutes   Marzetta Board MD [facility] Pager [phone]. If 7PM-7AM, please contact night-coverage at [email or web address], password [facility] 06/11/2013, 9:13 AM  LOS: 11 days

## 2013-06-13 ENCOUNTER — Telehealth: Payer: Self-pay | Admitting: Family Medicine

## 2013-06-13 ENCOUNTER — Encounter: Payer: Self-pay | Admitting: Family Medicine

## 2013-06-13 ENCOUNTER — Ambulatory Visit (INDEPENDENT_AMBULATORY_CARE_PROVIDER_SITE_OTHER): Payer: 59 | Admitting: Family Medicine

## 2013-06-13 VITALS — BP 110/76 | HR 98 | Temp 98.2°F | Resp 20 | Ht 64.5 in | Wt 303.0 lb

## 2013-06-13 DIAGNOSIS — N200 Calculus of kidney: Secondary | ICD-10-CM

## 2013-06-13 DIAGNOSIS — R5383 Other fatigue: Secondary | ICD-10-CM

## 2013-06-13 DIAGNOSIS — I2699 Other pulmonary embolism without acute cor pulmonale: Secondary | ICD-10-CM

## 2013-06-13 DIAGNOSIS — R5381 Other malaise: Secondary | ICD-10-CM

## 2013-06-13 DIAGNOSIS — I8000 Phlebitis and thrombophlebitis of superficial vessels of unspecified lower extremity: Secondary | ICD-10-CM

## 2013-06-13 DIAGNOSIS — R0602 Shortness of breath: Secondary | ICD-10-CM

## 2013-06-13 DIAGNOSIS — G4700 Insomnia, unspecified: Secondary | ICD-10-CM

## 2013-06-13 MED ORDER — LORAZEPAM 1 MG PO TABS: ORAL_TABLET | ORAL | Status: DC

## 2013-06-13 NOTE — Progress Notes (Signed)
OFFICE NOTE  06/13/2013  CC:  Chief Complaint  Patient presents with  . Hospitalization Follow-up     HPI: Patient is a 57 y.o. Caucasian female who is here for hospital f/u. Hospitalized for percutaneous nephrolithotomy of 2.3 cm right kidney stone.  On POD #1 she developed SOB and was found to have a right middle lobe PE with segmental extension.  LE doppler showed no DVT. Had some hematuria on heparin, plan was to do IVC filter.  However, hematuria stopped and pt was put on xarelto and no IVC filter was placed.  Dr. Cruzita Lederer started her on this med 4 d/a. She was d/c'd home 2 d/a.   She has not noted any blood in her urine or stool.  Feeling SOB--same as in hospital, but noticeably worse at times since she has been d/c'd because of more activity/ambulation required since d/c.  No chest pain.  No n/v/d.  She is nervous when trying to sleep: says ativan helped in the hospital.  Says area of left thigh superficial phlebitis + cellulitis still hurts but is overall a bit improved since d/c. Hosp d/c summary reviewed: CT scan of this region was done 06/10/13 and it showed no fluid collection/abscess.  Her regimen at that time was vanc and rocephin, and the area began to improve and her wbc count went down and she was switched over to bactrim and augmentin to go home on.   Pertinent PMH:  Past medical, surgical, social, and family history reviewed.  MEDS:  Outpatient Prescriptions Prior to Visit  Medication Sig Dispense Refill  . amoxicillin-clavulanate (AUGMENTIN) 875-125 MG per tablet Take 1 tablet by mouth 2 (two) times daily.  20 tablet  0  . levothyroxine (SYNTHROID, LEVOTHROID) 175 MCG tablet Take 175 mcg by mouth daily before breakfast.      . lisinopril (PRINIVIL,ZESTRIL) 40 MG tablet Take 40 mg by mouth every morning.      . metFORMIN (GLUCOPHAGE) 500 MG tablet Take 500 mg by mouth every evening.       Marland Kitchen oxyCODONE (ROXICODONE) 15 MG immediate release tablet Take 15 mg by mouth 4  (four) times daily. FOR BACK PAIN AND ARTHRITIS AND RT KNEE PAIN      . Rivaroxaban (XARELTO) 20 MG TABS tablet Take 1 tablet (20 mg total) by mouth daily with supper. AFTER you finish the starter pack.  30 tablet  2  . sulfamethoxazole-trimethoprim (BACTRIM DS) 800-160 MG per tablet Take 1 tablet by mouth every 12 (twelve) hours.  20 tablet  0  . Rivaroxaban (XARELTO STARTER PACK) 15 & 20 MG TBPK Take as directed on package: Start with one 15mg  tablet by mouth twice a day with food. On Day 22, switch to one 20mg  tablet once a day with food.  51 each  0  . acetaminophen (TYLENOL) 500 MG tablet Take 1,000 mg by mouth every 6 (six) hours as needed for mild pain or headache.       . Multiple Vitamin (MULTIVITAMIN WITH MINERALS) TABS tablet Take 1 tablet by mouth daily.      . naproxen sodium (ANAPROX) 220 MG tablet Take 220 mg by mouth 3 (three) times daily with meals.       . progesterone (PROMETRIUM) 200 MG capsule Take 200 mg by mouth at bedtime. For 14 nights every other month      . vitamin B-12 (CYANOCOBALAMIN) 100 MCG tablet Take 100 mcg by mouth daily.      Marland Kitchen estradiol (MINIVELLE) 0.025 MG/24HR Place  1 patch onto the skin once a week. Changes on saturdays       No facility-administered medications prior to visit.    PE: Blood pressure 110/76, pulse 98, temperature 98.2 F (36.8 C), temperature source Temporal, resp. rate 20, height 5' 4.5" (1.638 m), weight 303 lb (137.44 kg), SpO2 95.00%. Gen: Alert, well appearing.  Patient is oriented to person, place, time, and situation. GMW:NUUV: no injection, icteris, swelling, or exudate.  EOMI, PERRLA. Mouth: lips without lesion/swelling.  Oral mucosa pink and moist. Oropharynx without erythema, exudate, or swelling.  CV: RRR, no m/r/g.   LUNGS: CTA bilat, nonlabored resps, good aeration in all lung fields. Bilat LE nonpitting edema.  Left medial thigh with mild diffuse erythema, warmth, and tenderness. No vesicles, no hematoma/violacious hue, no  desquamation/maceration.  LABS: none today Recent:  Lab Results  Component Value Date   WBC 10.4 06/11/2013   HGB 9.8* 06/11/2013   HCT 30.8* 06/11/2013   MCV 91.7 06/11/2013   PLT 285 06/11/2013     Chemistry      Component Value Date/Time   NA 137 06/11/2013 0344   K 4.3 06/11/2013 0344   CL 100 06/11/2013 0344   CO2 27 06/11/2013 0344   BUN 16 06/11/2013 0344   CREATININE 0.75 06/11/2013 0344   CREATININE 0.62 05/31/2012 1524      Component Value Date/Time   CALCIUM 8.9 06/11/2013 0344   ALKPHOS 142* 06/11/2013 0344   AST 30 06/11/2013 0344   ALT 40* 06/11/2013 0344   BILITOT 0.2* 06/11/2013 0344      IMPRESSION AND PLAN:  Renal stone She is s/p successful right percutaneous nephrolithotomy of 50mm stone by Dr. Jeffie Pollock on 05/31/13.  Pulmonary embolism on right Continue xarelto as directed by hospital MDs/vascular MDs (she is still on her starter pack). No sign of bleeding at this time--will recheck CBC at next office f/u here in 2 wks.  Shortness of breath Longstanding:  deconditioning/obesity + now with PE. This is stable.  She is noticing more breathlessness since d/c due to having to move around more compared to being in hospital bed all day. Reassured pt: lungs clear, oxygen sat 95% on RA.   Thrombophlebitis leg superficial Painful. Encouraged pt to continue cool compresses, be as active as possible, and finish out the antibiotics she was rx'd in hosp (bactrim and augmentin).  Insomnia Anxiety related to her overall general medical condition is making it very hard for her to rest. She and husband say ativan helped well in the hospital, so I have given her a rx for this today (1mg , 1 qhs, #30, RF x 1).  Other malaise and fatigue Multifactorial: acute-on-chronic illness, morbid obesity w/recent 2 wks lying in hospital bed, stress, anemia.   An After Visit Summary was printed and given to the patient.  FOLLOW UP: 1 wk (needs CBC recheck, too) to re-eval left thigh.  Will  get her on oral iron at that time.

## 2013-06-13 NOTE — Assessment & Plan Note (Addendum)
Anxiety related to her overall general medical condition is making it very hard for her to rest. She and husband say ativan helped well in the hospital, so I have given her a rx for this today (1mg , 1 qhs, #30, RF x 1).

## 2013-06-13 NOTE — Assessment & Plan Note (Signed)
Painful. Encouraged pt to continue cool compresses, be as active as possible, and finish out the antibiotics she was rx'd in hosp (bactrim and augmentin).

## 2013-06-13 NOTE — Progress Notes (Signed)
Pre visit review using our clinic review tool, if applicable. No additional management support is needed unless otherwise documented below in the visit note. 

## 2013-06-13 NOTE — Telephone Encounter (Signed)
Pls call pt and tell her that I changed my mind about her follow up: I told her to f/u in 2 wks when I saw her yesterday but after further consideration I would rather she come back in ONE week for o/v for recheck of her thigh and blood work.--thx

## 2013-06-13 NOTE — Assessment & Plan Note (Signed)
Multifactorial: acute-on-chronic illness, morbid obesity w/recent 2 wks lying in hospital bed, stress, anemia.

## 2013-06-13 NOTE — Assessment & Plan Note (Signed)
Continue xarelto as directed by hospital MDs/vascular MDs (she is still on her starter pack). No sign of bleeding at this time--will recheck CBC at next office f/u here in 2 wks.

## 2013-06-13 NOTE — Assessment & Plan Note (Signed)
Longstanding:  deconditioning/obesity + now with PE. This is stable.  She is noticing more breathlessness since d/c due to having to move around more compared to being in hospital bed all day. Reassured pt: lungs clear, oxygen sat 95% on RA.

## 2013-06-13 NOTE — Assessment & Plan Note (Signed)
She is s/p successful right percutaneous nephrolithotomy of 17mm stone by Dr. Jeffie Pollock on 05/31/13.

## 2013-06-15 NOTE — Telephone Encounter (Signed)
Left message for patient to CB to move appt to next week.

## 2013-06-20 NOTE — Discharge Summary (Signed)
Physician Discharge Summary  Patient ID: Tricia Perry MRN: [id number] DOB/AGE: 59-Nov-1957 59 y.o.  Admit date: 05/31/2013 Discharge date: 06/20/2013  Admission Diagnoses:  Renal stone  Discharge Diagnoses:  Principal Problem:   Renal stone Active Problems:   Pulmonary embolism on right   Thrombophlebitis leg superficial   Past Medical History  Diagnosis Date  . Insulin resistance   . Hypertension   . Hypothyroidism   . 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 DDD (degenerative disc disease)     Dr. Eddie Perry evaluated her and recommended pain mgmt MD.  She saw Dr. Selinda Perry for pain mgmt at one time.  Marland Kitchen GERD (gastroesophageal reflux disease)   . Insomnia   . Morbid obesity     BMI 49.  Gastric stapling surgery in distant past.  . Peripheral neuropathy     No old records to confirm pt's report.  Pt refuses to have more NCS b/c test is painful.  . Carpal tunnel syndrome     bilateral  . Fibromyalgia     questionable  . Nephrolithiasis     Lithotripsy 2002.  Right percut nephrolith 05/2013.  Marland Kitchen Disequilibrium syndrome     MRI brain normal 2012  . DIZZINESS 04/12/2010    Qualifier: Diagnosis of  By: Tricia Perry M.D., Brien Few   . Carpal tunnel syndrome on both sides 06/21/2010  . Mixed incontinence urge and stress   . Microscopic hematuria 2014    CT abd pelv showed 1.8 cm right renal pelvis stone.  Also had UA c/w UTI so abx given.   . Shoulder pain     HX OF BILATERAL SHOULDER SURGERIES - PT HAS VERY LIMITED ROM - ESPECIALLY RAISING HER ARMS  . Complication of anesthesia     STATES SHE WAS TOLD SHE WOKE UP DURING HER KNEE REPLACEMENT SURGERY AND HAD TO BE GIVEN MORE MEDICINE - NOT SURE IF SPINAL OR GENERAL ANESTHESIA  . Chronic venous insufficiency     with edema  and extensive varicose dz: Dr. Linus Perry is managing this, planning laser procedures.; A LOT OF LEG CRAMPS AND LEG STIFFNESS  . Asthma     NOT NEEDED INHALER IN OVER A YEAR  . Heart  murmur     functional, w/u done; PALPITATIONS IN THE PAST  . Sleep apnea     PT DOES NOT HAVE MASK OR TUBING - JUST SLEEPS WITH HEAD ELEVATED ON 2 PILLOWS   . Diabetes mellitus without complication     ORAL MEDICATION  . Urinary frequency     AND NOCTURIA - UP AT NIGHT 5 OR 6 TIMES TO BATHROOM  . Renal stone 05/31/2013    Surgeries: Procedure(s): NEPHROLITHOTOMY PERCUTANEOUS on 05/31/2013   Consultants (if any): Treatment Team:  Tricia Lis, MD  Discharged Condition: Improved  Hospital Course: Tricia Perry is an 58 y.o. female who was admitted 05/31/2013 with a diagnosis of a 37mm Renal stone and went to the operating room on 05/31/2013 and underwent the above named procedures.  The were no intraoperative complications.  She had PSA hose placed at the time of the procedure and they were maintained postop.   She had also had a full w/u for thrombosis about 2 weeks preop because of an elevated d-dimer per patient report.   On POD #1 a KUB showed no residual fragments and the perc tube was removed.  she reports SOB that didn't respond to a breathing treatment and a CXR was  unremarkable.  A hospitalist consult was obtained with Dr. Doyle Perry and a Chest CTA was obtained which showed a PE.   She was started on a heparin drip but developed recurrent hematuria.  She had a progressive anemia, but a renal US showed no hematoma or hydronephrosis. The heparin was held and then resumed once again with hematuria.   A greenfield filter was considered but the hematuria stopped and it was not felt to be required.  The heparin was continued and she was eventually transitioned to Xarelto.  She subsequently developed erythema of the left inner thigh.   Doppler US showed no DVT but she did have thrombophlebitis of a large superficial vein.   She was initial started on Vancomycin but didn't respond so Ceftriaxone was added with eventual response.  On 06/11/13 she was felt to be ready for discharge and was sent home on Xarelto,  Augmentin and Bactrim. She will f/u with me in 1-2 weeks and with medicine in 1 week.  She was given perioperative antibiotics:  Anti-infectives   Start     Dose/Rate Route Frequency Ordered Stop   06/11/13 1000  sulfamethoxazole-trimethoprim (BACTRIM DS) 800-160 MG per tablet 1 tablet  Status:  Discontinued     1 tablet Oral Every 12 hours 06/11/13 0843 06/11/13 1818   06/11/13 1000  amoxicillin-clavulanate (AUGMENTIN) 875-125 MG per tablet 1 tablet  Status:  Discontinued     1 tablet Oral 2 times daily 06/11/13 0843 06/11/13 1818   06/11/13 0000  amoxicillin-clavulanate (AUGMENTIN) 875-125 MG per tablet     1 tablet Oral 2 times daily 06/11/13 0846     06/11/13 0000  sulfamethoxazole-trimethoprim (BACTRIM DS) 800-160 MG per tablet     1 tablet Oral Every 12 hours 06/11/13 0846     06/09/13 1300  cefTRIAXone (ROCEPHIN) 1 g in dextrose 5 % 50 mL IVPB  Status:  Discontinued     1 g 100 mL/hr over 30 Minutes Intravenous Every 24 hours 06/09/13 1203 06/11/13 0843   06/08/13 0600  vancomycin (VANCOCIN) 1,250 mg in sodium chloride 0.9 % 250 mL IVPB  Status:  Discontinued     1,250 mg 166.7 mL/hr over 90 Minutes Intravenous Every 12 hours 06/07/13 1752 06/11/13 0843   06/07/13 1800  vancomycin (VANCOCIN) 2,500 mg in sodium chloride 0.9 % 500 mL IVPB     2,500 mg 250 mL/hr over 120 Minutes Intravenous  Once 06/07/13 1751 06/07/13 2021   05/31/13 2000  ciprofloxacin (CIPRO) tablet 500 mg  Status:  Discontinued     500 mg Oral 2 times daily 05/31/13 1442 06/02/13 1330   05/31/13 0730  ciprofloxacin (CIPRO) IVPB 400 mg  Status:  Discontinued     400 mg 200 mL/hr over 60 Minutes Intravenous On call 05/31/13 0724 05/31/13 0730   05/31/13 0724  ciprofloxacin (CIPRO) IVPB 400 mg  Status:  Discontinued     400 mg 200 mL/hr over 60 Minutes Intravenous 60 min pre-op 05/31/13 0724 05/31/13 1442    .  She was given sequential compression devices for DVT prophylaxis.    Recent vital signs:  Filed  Vitals:   06/11/13 0527  BP: 105/52  Pulse: 88  Temp: 98.3 F (36.8 C)  Resp: 20    Recent laboratory studies:  Lab Results  Component Value Date   HGB 9.8* 06/11/2013   HGB 10.6* 06/10/2013   HGB 11.0* 06/09/2013   Lab Results  Component Value Date   WBC 10.4 06/11/2013   PLT 285 06/11/2013  Lab Results  Component Value Date   INR 0.99 06/09/2013   Lab Results  Component Value Date   NA 137 06/11/2013   K 4.3 06/11/2013   CL 100 06/11/2013   CO2 27 06/11/2013   BUN 16 06/11/2013   CREATININE 0.75 06/11/2013   GLUCOSE 125* 06/11/2013    Discharge Medications:     Medication List         acetaminophen 500 MG tablet  Commonly known as:  TYLENOL  Take 1,000 mg by mouth every 6 (six) hours as needed for mild pain or headache.     amoxicillin-clavulanate 875-125 MG per tablet  Commonly known as:  AUGMENTIN  Take 1 tablet by mouth 2 (two) times daily.     levothyroxine 175 MCG tablet  Commonly known as:  SYNTHROID, LEVOTHROID  Take 175 mcg by mouth daily before breakfast.     lisinopril 40 MG tablet  Commonly known as:  PRINIVIL,ZESTRIL  Take 40 mg by mouth every morning.     metFORMIN 500 MG tablet  Commonly known as:  GLUCOPHAGE  Take 500 mg by mouth every evening.     multivitamin with minerals Tabs tablet  Take 1 tablet by mouth daily.     naproxen sodium 220 MG tablet  Commonly known as:  ANAPROX  Take 220 mg by mouth 3 (three) times daily with meals.     oxyCODONE 15 MG immediate release tablet  Commonly known as:  ROXICODONE  Take 15 mg by mouth 4 (four) times daily. FOR BACK PAIN AND ARTHRITIS AND RT KNEE PAIN     progesterone 200 MG capsule  Commonly known as:  PROMETRIUM  Take 200 mg by mouth at bedtime. For 14 nights every other month     Rivaroxaban 20 MG Tabs tablet  Commonly known as:  XARELTO  Take 1 tablet (20 mg total) by mouth daily with supper. AFTER you finish the starter pack.     sulfamethoxazole-trimethoprim 800-160 MG per tablet   Commonly known as:  BACTRIM DS  Take 1 tablet by mouth every 12 (twelve) hours.     vitamin B-12 100 MCG tablet  Commonly known as:  CYANOCOBALAMIN  Take 100 mcg by mouth daily.        Diagnostic Studies: Abd 1 View (kub)  06/01/2013   CLINICAL DATA:  Status post right percutaneous nephrolithotomy for renal calculus.  EXAM: ABDOMEN - 1 VIEW  COMPARISON:  DG C-ARM 1-60 MIN-NO REPORT dated 05/31/2013; IR [facility] CATH PERC *R* dated 05/31/2013; IR NEPHROSTOGRAM*R* dated 05/26/2013  FINDINGS: Large bore nephrostomy tube and ureteral safety catheter are visualized on the right. There are no visible stone fragments overlying the Kidney, course of the ureter or bladder. Bowel gas pattern is unremarkable.  IMPRESSION: No visualize retained stone fragments.   Electronically Signed   By: Aletta Edouard M.D.   On: 06/01/2013 08:04   Dg Abd 1 View  05/31/2013   CLINICAL DATA:  Pre cutaneous removal of a right renal stone.  EXAM: ABDOMEN - 1 VIEW  COMPARISON:  CT, 03/22/2013  FINDINGS: Images show placement of a nephroureteral wire followed by a balloon with subsequent placement of a stone extraction device. The stone in the right kidney was then removed. Subsequent imaging with contrast enhancement of the right intrarenal collecting system shows no convincing residual stone  IMPRESSION: Percutaneous removal of right renal stone. No evidence of a residual right renal stone or procedure complication.   Electronically Signed   By: Shanon Brow  Ormond M.D.   On: 05/31/2013 14:22   Ct Angio Chest Pe W/cm &/or Wo Cm  06/01/2013   CLINICAL DATA:  Evaluate for pulmonary arterial embolic disease.  EXAM: CT ANGIOGRAPHY CHEST WITH CONTRAST  TECHNIQUE: Multidetector CT imaging of the chest was performed using the standard protocol during bolus administration of intravenous contrast. Multiplanar CT image reconstructions and MIPs were obtained to evaluate the vascular anatomy.  CONTRAST:  142mL OMNIPAQUE IOHEXOL 350 MG/ML SOLN   COMPARISON:  None.  FINDINGS: The thoracic inlet is unremarkable.  Evaluation of mediastinal hilar region structures demonstrate no evidence of adenopathy nor masses. The RV/ LV ratio calculated at 0.77 which is normal. Multichamber cardiac enlargement is appreciated.  A filling defect is appreciated within the right middle lobe pulmonary artery with extension into a segmental branch. Findings appreciated on image 29 series 4.  Evaluation of the lung parenchyma is degraded by respiratory artifact. There is diffuse ground-glass density throughout both lungs as well as thickening of the interstitial markings. Ill-defined areas of increased density project within the lung bases with linear and consolidative components.  A trace right pleural effusion is appreciated.  Visualized upper abdominal viscera demonstrate postsurgical changes within the stomach, otherwise grossly unremarkable.  Review of the MIP images confirms the above findings.  IMPRESSION: Pulmonary embolus within the right middle lobe pulmonary artery with segmental extension. The RV LV ratio is within normal limits.  Interstitial infiltrate likely reflecting component of pulmonary edema an infectious or inflammatory abnormality cannot be excluded. These results will be called to the ordering clinician or representative by the Radiologist Assistant, and communication documented in the PACS Dashboard.   Electronically Signed   By: Margaree Mackintosh M.D.   On: 06/01/2013 16:23   Ct Femur Left W Contrast  06/10/2013   CLINICAL DATA:  Worsening cellulitis despite IV antibiotics.  EXAM: CT OF THE LEFT FEMUR WITH CONTRAST  TECHNIQUE: Multidetector CT imaging of the bilateral lower extremities was performed according to the standard protocol following intravenous contrast administration. Sagittal and coronal plane reformatted images were reconstructed from the axial CT dat  CONTRAST:  126mL OMNIPAQUE IOHEXOL 300 MG/ML  SOLN  COMPARISON:  None.  FINDINGS: In the  upper to mid thigh there is abnormal subcutaneous edema superficial to the sartorius muscle. Further caudad in the mid and lower thigh, the subcutaneous edema is primarily the medial. There are tortuous medial subcutaneous venous varicosities as well, shown on image 81 of series 604, and some of the inflammation is in the vicinity of these varicosities. There is evidence of superficial thrombophlebitis in the medial calf on image 346 of series 4.  The subcutaneous edema does extend into the calf medially. This extends beyond the inferior margin of this scan. If dedicated CT imaging of the calf is warranted, a separate request for CT imaging of the calf would be required.  Within the thigh and visualized upper calf, no abscess is identified. No CT findings of osteomyelitis. There is osteoarthritis of the knee. No knee effusion to suggest septic joint. 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 No compelling CT findings of myositis or fasciitis, although MRI can provide greater sensitivity in assessing for intramuscular edema.  IMPRESSION: 1. Cellulitis involving the anterior upper thigh and extending into the medial mid and lower thigh, and into the medial calf. No abscess or osteomyelitis identified. No knee joint or hip joint effusion observed. The subcutaneous edema does extend distally in the calf. 2. Extensive subcutaneous venous varicosities in  the medial thigh and calf, with evidence of superficial thrombophlebitis in the medial calf within one of these varices.   Electronically Signed   By: Sherryl Barters M.D.   On: 06/10/2013 10:32   US Renal  06/03/2013   CLINICAL DATA:  Evaluate for hydronephrosis.  EXAM: RENAL/URINARY TRACT ULTRASOUND COMPLETE  COMPARISON:  None.  FINDINGS: Right Kidney:  Length: 13 cm. Echogenicity within normal limits. No mass or hydronephrosis visualized. Scarring and cortical volume loss from the upper pole the right kidney is noted. Dilatation of the upper pole  calices is again identified. Echogenic material within the central pelvis is favored to represent gas. No renal stone noted. No perinephric fluid collections identified  Left Kidney:  Length: 12.7 cm. Echogenicity within normal limits. No mass or hydronephrosis visualized.  Bladder:  Appears normal for degree of bladder distention.  IMPRESSION: No perinephric fluid collections identified to suggest hematoma or urinoma status post nephrolithotomy.  Persistent dilatation of the upper pole caliectasis of the right kidney and with associated cortical volume loss from the upper pole likely due to chronic obstruction.  Normal appearing left kidney.   Electronically Signed   By: Kerby Moors M.D.   On: 06/03/2013 16:12   Ir Nephrostogram Right  05/31/2013   CLINICAL DATA:  Right nephrolithiasis  EXAM: IR RIGHT PERC INTRO URET CATH; RIGHT NEPHROSTOGRAM  MEDICATIONS AND MEDICAL HISTORY: Versed 10 mg, Fentanyl 6 under mcg.  As antibiotic prophylaxis, Cipro was ordered pre-procedure and administered intravenously within one hour of incision.  ANESTHESIA/SEDATION: Moderate sedation time: 50 minutes  CONTRAST:  15 cc Omnipaque 300  FLUOROSCOPY TIME:  25 min and 18 seconds.  PROCEDURE: The procedure, risks, benefits, and alternatives were explained to the patient. Questions regarding the procedure were encouraged and answered. The patient understands and consents to the procedure.  The right that was prepped with Betadine in a sterile fashion, and a sterile drape was applied covering the operative field. A sterile gown and sterile gloves were used for the procedure.  The existing nephrostomy was cut and exchanged over a Bentson wire for a 5 [place] Kumpe the catheter. Contrast was injected for a nephrostogram. An attempt was made to cross the pelvis into the ureter unsuccessfully. The Kumpe the was exchanged for a 6 French sheath then a cobra 2 catheter and finally a 5 [place] glide catheter. This was advanced over a roadrunner  wire into the pelvis and ureter and finally the bladder. Contrast was injected. It was sewn in place.  COMPLICATIONS: None  FINDINGS: Imaging demonstrates a large pelvic calculus. 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 The final image demonstrates a 5 [place] glide catheter extending from a lower pole calyx to the bladder.  IMPRESSION: Successful nephro ureteral catheter placement as described. UPJ stricture is noted.   Electronically Signed   By: Maryclare Bean M.D.   On: 05/31/2013 10:16   Ir Nephrostogram Right  05/26/2013   CLINICAL DATA:  Right renal calculus  EXAM: PERC NEPHROSTOMY*R*; IR ULTRASOUND GUIDANCE TISSUE ABLATION; RIGHT NEPHROSTOGRAM  MEDICATIONS AND MEDICAL HISTORY: Versed 8 mg, Fentanyl 300 mcg.  As antibiotic prophylaxis, ciprofloxacin was ordered pre-procedure and administered intravenously within one hour of incision.  ANESTHESIA/SEDATION: Moderate sedation time: 40 minutes  CONTRAST:  Thirty cc Omnipaque 300  FLUOROSCOPY TIME:  19 min and 48 seconds.  PROCEDURE: The procedure, risks, benefits, and alternatives were explained to the patient. Questions regarding the procedure were encouraged and answered. The patient understands and consents to the  procedure.  The right back was prepped with Betadine in a sterile fashion, and a sterile drape was applied covering the operative field. A sterile gown and sterile gloves were used for the procedure.  Under sonographic guidance, a 22 gauge Chiba needle was inserted into the dilated lower pole caliectasis. It was up sized to a 3 J. A Kumpe catheter was inserted. Contrast was injected for a nephrostogram. Access across the obstructing calculus in the lower infundibulum was attempted exhaust oblique utilizing a glidewire and roadrunner wire. Peripheral to the calculus, of the lower pole calices are severely dilated rendering the collecting system patulous. The copy was then exchanged for an 8 French nephrostomy which was coiled in the dilated  lower pole talus system. It was looped and string fixed. Contrast was injected  COMPLICATIONS: None  FINDINGS: Imaging demonstrates access into the markedly dilated lower pole caliectasis system. Contrast never traversed into the renal pelvis. This functions has a nephrostogram demonstrating obstruction of the lower pole caliectasis.  The lower pole calyx system is patulous. The final image demonstrates an 8 French nephrostomy coiled in the lower pole caliectasis system.  IMPRESSION: Attempted access across the lower pole calculus in the ureter was unsuccessful. A nephrostomy catheter was coiled in the dilated lower pole collecting system. It is hoped that after drainage for 1-2 weeks, the patulous system will decompress allowing for access into the ureter.   Electronically Signed   By: Maryclare Bean M.D.   On: 05/26/2013 12:39   Ir Perc Nephrostomy Right  05/26/2013   CLINICAL DATA:  Right renal calculus  EXAM: PERC NEPHROSTOMY*R*; IR ULTRASOUND GUIDANCE TISSUE ABLATION; RIGHT NEPHROSTOGRAM  MEDICATIONS AND MEDICAL HISTORY: Versed 8 mg, Fentanyl 300 mcg.  As antibiotic prophylaxis, ciprofloxacin was ordered pre-procedure and administered intravenously within one hour of incision.  ANESTHESIA/SEDATION: Moderate sedation time: 40 minutes  CONTRAST:  Thirty cc Omnipaque 300  FLUOROSCOPY TIME:  19 min and 48 seconds.  PROCEDURE: The procedure, risks, benefits, and alternatives were explained to the patient. Questions regarding the procedure were encouraged and answered. The patient understands and consents to the procedure.  The right back was prepped with Betadine in a sterile fashion, and a sterile drape was applied covering the operative field. A sterile gown and sterile gloves were used for the procedure.  Under sonographic guidance, a 22 gauge Chiba needle was inserted into the dilated lower pole caliectasis. It was up sized to a 3 J. A Kumpe catheter was inserted. Contrast was injected for a nephrostogram. Access  across the obstructing calculus in the lower infundibulum was attempted exhaust oblique utilizing a glidewire and roadrunner wire. Peripheral to the calculus, of the lower pole calices are severely dilated rendering the collecting system patulous. The copy was then exchanged for an 8 French nephrostomy which was coiled in the dilated lower pole talus system. It was looped and string fixed. Contrast was injected  COMPLICATIONS: None  FINDINGS: Imaging demonstrates access into the markedly dilated lower pole caliectasis system. Contrast never traversed into the renal pelvis. This functions has a nephrostogram demonstrating obstruction of the lower pole caliectasis.  The lower pole calyx system is patulous. The final image demonstrates an 8 French nephrostomy coiled in the lower pole caliectasis system.  IMPRESSION: Attempted access across the lower pole calculus in the ureter was unsuccessful. A nephrostomy catheter was coiled in the dilated lower pole collecting system. It is hoped that after drainage for 1-2 weeks, the patulous system will decompress allowing for access into the  ureter.   Electronically Signed   By: Maryclare Bean M.D.   On: 05/26/2013 12:39   Ir US Guide Bx Asp/drain  05/26/2013   CLINICAL DATA:  Right renal calculus  EXAM: PERC NEPHROSTOMY*R*; IR ULTRASOUND GUIDANCE TISSUE ABLATION; RIGHT NEPHROSTOGRAM  MEDICATIONS AND MEDICAL HISTORY: Versed 8 mg, Fentanyl 300 mcg.  As antibiotic prophylaxis, ciprofloxacin was ordered pre-procedure and administered intravenously within one hour of incision.  ANESTHESIA/SEDATION: Moderate sedation time: 40 minutes  CONTRAST:  Thirty cc Omnipaque 300  FLUOROSCOPY TIME:  19 min and 48 seconds.  PROCEDURE: The procedure, risks, benefits, and alternatives were explained to the patient. Questions regarding the procedure were encouraged and answered. The patient understands and consents to the procedure.  The right back was prepped with Betadine in a sterile fashion, and  a sterile drape was applied covering the operative field. A sterile gown and sterile gloves were used for the procedure.  Under sonographic guidance, a 22 gauge Chiba needle was inserted into the dilated lower pole caliectasis. It was up sized to a 3 J. A Kumpe catheter was inserted. Contrast was injected for a nephrostogram. Access across the obstructing calculus in the lower infundibulum was attempted exhaust oblique utilizing a glidewire and roadrunner wire. Peripheral to the calculus, of the lower pole calices are severely dilated rendering the collecting system patulous. The copy was then exchanged for an 8 French nephrostomy which was coiled in the dilated lower pole talus system. It was looped and string fixed. Contrast was injected  COMPLICATIONS: None  FINDINGS: Imaging demonstrates access into the markedly dilated lower pole caliectasis system. Contrast never traversed into the renal pelvis. This functions has a nephrostogram demonstrating obstruction of the lower pole caliectasis.  The lower pole calyx system is patulous. The final image demonstrates an 8 French nephrostomy coiled in the lower pole caliectasis system.  IMPRESSION: Attempted access across the lower pole calculus in the ureter was unsuccessful. A nephrostomy catheter was coiled in the dilated lower pole collecting system. It is hoped that after drainage for 1-2 weeks, the patulous system will decompress allowing for access into the ureter.   Electronically Signed   By: Maryclare Bean M.D.   On: 05/26/2013 12:39   Dg Chest Port 1 View  06/01/2013   CLINICAL DATA:  Shortness of breath.  EXAM: PORTABLE CHEST - 1 VIEW  COMPARISON:  PA and lateral chest and CT chest 04/20/2013.  FINDINGS: Subsegmental atelectasis is seen in the lung bases. Lung volumes are low. No consolidative process, pneumothorax or effusion. Heart size is normal.  IMPRESSION: Subsegmental bibasilar atelectasis in a low volume chest.   Electronically Signed   By: Inge Rise M.D.   On: 06/01/2013 09:50   Dg C-arm 1-60 Min-no Report  05/31/2013   CLINICAL DATA: kidney stone right   C-ARM 1-60 MINUTES  Fluoroscopy was utilized by the requesting physician.  No radiographic  interpretation.    Ir [facility] Cath Perc Right  05/31/2013   CLINICAL DATA:  Right nephrolithiasis  EXAM: IR RIGHT PERC INTRO URET CATH; RIGHT NEPHROSTOGRAM  MEDICATIONS AND MEDICAL HISTORY: Versed 10 mg, Fentanyl 6 under mcg.  As antibiotic prophylaxis, Cipro was ordered pre-procedure and administered intravenously within one hour of incision.  ANESTHESIA/SEDATION: Moderate sedation time: 50 minutes  CONTRAST:  15 cc Omnipaque 300  FLUOROSCOPY TIME:  25 min and 18 seconds.  PROCEDURE: The procedure, risks, benefits, and alternatives were explained to the patient. Questions regarding the procedure were encouraged and answered. The  patient understands and consents to the procedure.  The right that was prepped with Betadine in a sterile fashion, and a sterile drape was applied covering the operative field. A sterile gown and sterile gloves were used for the procedure.  The existing nephrostomy was cut and exchanged over a Bentson wire for a 5 [place] Kumpe the catheter. Contrast was injected for a nephrostogram. An attempt was made to cross the pelvis into the ureter unsuccessfully. The Kumpe the was exchanged for a 6 French sheath then a cobra 2 catheter and finally a 5 [place] glide catheter. This was advanced over a roadrunner wire into the pelvis and ureter and finally the bladder. Contrast was injected. It was sewn in place.  COMPLICATIONS: None  FINDINGS: Imaging demonstrates a large pelvic calculus. 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 The final image demonstrates a 5 [place] glide catheter extending from a lower pole calyx to the bladder.  IMPRESSION: Successful nephro ureteral catheter placement as described. UPJ stricture is noted.   Electronically Signed   By: Maryclare Bean M.D.   On:  05/31/2013 10:16    Disposition: 01-Home or Self Care       Future Appointments Provider Department Dept Phone   06/22/2013 2:15 PM Tammi Sou, MD [place] [phone]      Follow-up Information   Follow up with Malka So, MD. (Call for time in 1-2 weeks if not already scheduled and bring stone to office. )    Specialty:  Urology   Contact information:   [facility]  [place] [address] [phone]        Signed: Malka So 06/20/2013, 9:58 PM

## 2013-06-22 ENCOUNTER — Ambulatory Visit (INDEPENDENT_AMBULATORY_CARE_PROVIDER_SITE_OTHER): Payer: 59 | Admitting: Family Medicine

## 2013-06-22 ENCOUNTER — Encounter: Payer: Self-pay | Admitting: Family Medicine

## 2013-06-22 VITALS — BP 138/83 | HR 87 | Temp 97.8°F | Resp 20 | Ht 64.5 in | Wt 297.0 lb

## 2013-06-22 DIAGNOSIS — L0291 Cutaneous abscess, unspecified: Secondary | ICD-10-CM

## 2013-06-22 DIAGNOSIS — R350 Frequency of micturition: Secondary | ICD-10-CM

## 2013-06-22 DIAGNOSIS — I2699 Other pulmonary embolism without acute cor pulmonale: Secondary | ICD-10-CM

## 2013-06-22 DIAGNOSIS — L0390 Cellulitis, unspecified: Secondary | ICD-10-CM

## 2013-06-22 LAB — POCT URINALYSIS DIPSTICK
Bilirubin, UA: NEGATIVE
Glucose, UA: NEGATIVE
KETONES UA: NEGATIVE
Nitrite, UA: NEGATIVE
Protein, UA: NEGATIVE
SPEC GRAV UA: 1.025
Urobilinogen, UA: 0.2
pH, UA: 6

## 2013-06-22 LAB — CBC WITH DIFFERENTIAL/PLATELET
Basophils Absolute: 0 10*3/uL (ref 0.0–0.1)
Basophils Relative: 0.7 % (ref 0.0–3.0)
EOS ABS: 0.3 10*3/uL (ref 0.0–0.7)
EOS PCT: 5.1 % — AB (ref 0.0–5.0)
HEMATOCRIT: 36.1 % (ref 36.0–46.0)
HEMOGLOBIN: 12 g/dL (ref 12.0–15.0)
Lymphocytes Relative: 27.5 % (ref 12.0–46.0)
Lymphs Abs: 1.6 10*3/uL (ref 0.7–4.0)
MCHC: 33.2 g/dL (ref 30.0–36.0)
MCV: 89.4 fl (ref 78.0–100.0)
Monocytes Absolute: 0.5 10*3/uL (ref 0.1–1.0)
Monocytes Relative: 8.1 % (ref 3.0–12.0)
NEUTROS ABS: 3.5 10*3/uL (ref 1.4–7.7)
Neutrophils Relative %: 58.6 % (ref 43.0–77.0)
Platelets: 452 10*3/uL — ABNORMAL HIGH (ref 150.0–400.0)
RBC: 4.04 Mil/uL (ref 3.87–5.11)
RDW: 14.4 % (ref 11.5–14.6)
WBC: 5.9 10*3/uL (ref 4.5–10.5)

## 2013-06-22 NOTE — Progress Notes (Signed)
OFFICE NOTE  06/22/2013  CC:  Chief Complaint  Patient presents with  . Follow-up     HPI: Patient is a 58 y.o. Caucasian female who is here for 10d f/u PE and left thigh superficial thrombophlebitis/cellulitis--needs f/u CBC.   Finished abx---says left leg feeling much better.   Last 2 nights has felt "feverish" and reports increased frequency of urination over and above her normal. Urine darker but no gross blood and no foul smell.  No dysuria.  Slight groin rash since being on abx.  No vag d/c. Slight sickish/weak feeling upon awakening last couple of days.  No fevers.  No n/v/d. No CP.  She has DOE with pretty minimal activity, which is pretty much her normal even prior to having her PE.   Pertinent PMH:  Past medical, surgical, social, and family history reviewed and no changes are noted since last office visit.  MEDS: pt not taking bactrim, augmentin, or prometrium or naproxen Outpatient Prescriptions Prior to Visit  Medication Sig Dispense Refill  . acetaminophen (TYLENOL) 500 MG tablet Take 1,000 mg by mouth every 6 (six) hours as needed for mild pain or headache.       . levothyroxine (SYNTHROID, LEVOTHROID) 175 MCG tablet Take 175 mcg by mouth daily before breakfast.      . lisinopril (PRINIVIL,ZESTRIL) 40 MG tablet Take 40 mg by mouth every morning.      Marland Kitchen LORazepam (ATIVAN) 1 MG tablet 1 tab po qhs for sleep  30 tablet  1  . metFORMIN (GLUCOPHAGE) 500 MG tablet Take 500 mg by mouth every evening.       Marland Kitchen oxyCODONE (ROXICODONE) 15 MG immediate release tablet Take 15 mg by mouth 4 (four) times daily. FOR BACK PAIN AND ARTHRITIS AND RT KNEE PAIN      . progesterone (PROMETRIUM) 200 MG capsule Take 200 mg by mouth at bedtime. For 14 nights every other month      . Rivaroxaban (XARELTO) 20 MG TABS tablet Take 1 tablet (20 mg total) by mouth daily with supper. AFTER you finish the starter pack.  30 tablet  2  . amoxicillin-clavulanate (AUGMENTIN) 875-125 MG per tablet Take 1  tablet by mouth 2 (two) times daily.  20 tablet  0  . Multiple Vitamin (MULTIVITAMIN WITH MINERALS) TABS tablet Take 1 tablet by mouth daily.      . naproxen sodium (ANAPROX) 220 MG tablet Take 220 mg by mouth 3 (three) times daily with meals.       Marland Kitchen sulfamethoxazole-trimethoprim (BACTRIM DS) 800-160 MG per tablet Take 1 tablet by mouth every 12 (twelve) hours.  20 tablet  0  . vitamin B-12 (CYANOCOBALAMIN) 100 MCG tablet Take 100 mcg by mouth daily.       No facility-administered medications prior to visit.    PE: Blood pressure 138/83, pulse 87, temperature 97.8 F (36.6 C), temperature source Temporal, resp. rate 20, height 5' 4.5" (1.638 m), weight 297 lb (134.718 kg), SpO2 97.00%. Gen: Alert, well appearing, morbidly obese-appearing.  Patient is oriented to person, place, time, and situation. CV: RRR, no m/r/g.   LUNGS: CTA bilat, nonlabored resps, good aeration in all lung fields. Left medial/upper thigh with mild hyperpigmented tint of skin where she used to have prominent erythema.  No erythema.  No warmth.  She has no signif TTP.  There area a few lumps/bumps along some large varicose veins.  LABS: CC UA today showed trace lysed blood, trace LEU  Lab Results  Component Value Date  HGBA1C 6.1* 05/31/2012     IMPRESSION AND PLAN:  1) Post op PE, stable from resp and anticoag standpoint.  No gross blood anywhere. Recheck CBC today.  Continue xarelto.  2) Chronic urge incontinence: sx's a bit worse lately per pt.  Recent augmentin and bactrim courses make UTI unlikely, but given her hx of recurrent UTI plus mildly abnl UA today will sent urine for c/s but no abx started today.  3) Superficial thrombophlebitis + cellulitis: left thigh--much improved.  Watchful waiting at this point.  4) Insulin resistance: compliant with med-continue metformin.  An After Visit Summary was printed and given to the patient.  FOLLOW UP: 3 mo

## 2013-06-22 NOTE — Progress Notes (Signed)
Pre visit review using our clinic review tool, if applicable. No additional management support is needed unless otherwise documented below in the visit note. 

## 2013-06-23 LAB — URINE CULTURE
Colony Count: NO GROWTH
Organism ID, Bacteria: NO GROWTH

## 2013-06-26 ENCOUNTER — Encounter: Payer: Self-pay | Admitting: Family Medicine

## 2013-06-27 ENCOUNTER — Ambulatory Visit: Payer: 59 | Admitting: Family Medicine

## 2013-07-05 ENCOUNTER — Encounter (HOSPITAL_COMMUNITY): Payer: Self-pay | Admitting: Urology

## 2013-07-05 NOTE — OR Nursing (Signed)
Added procedure end time 

## 2013-07-14 ENCOUNTER — Encounter: Payer: Self-pay | Admitting: Family Medicine

## 2013-07-14 ENCOUNTER — Ambulatory Visit (HOSPITAL_BASED_OUTPATIENT_CLINIC_OR_DEPARTMENT_OTHER)
Admission: RE | Admit: 2013-07-14 | Discharge: 2013-07-14 | Disposition: A | Discharge status: Home / Self Care | Payer: 59 | Source: Ambulatory Visit | Attending: Family Medicine | Admitting: Family Medicine

## 2013-07-14 ENCOUNTER — Ambulatory Visit (INDEPENDENT_AMBULATORY_CARE_PROVIDER_SITE_OTHER): Payer: 59 | Admitting: Family Medicine

## 2013-07-14 VITALS — BP 136/85 | HR 78 | Temp 97.9°F | Resp 20 | Ht 64.0 in | Wt 297.0 lb

## 2013-07-14 DIAGNOSIS — R05 Cough: Secondary | ICD-10-CM | POA: Insufficient documentation

## 2013-07-14 DIAGNOSIS — E662 Morbid (severe) obesity with alveolar hypoventilation: Secondary | ICD-10-CM

## 2013-07-14 DIAGNOSIS — R0602 Shortness of breath: Secondary | ICD-10-CM

## 2013-07-14 DIAGNOSIS — R059 Cough, unspecified: Secondary | ICD-10-CM | POA: Insufficient documentation

## 2013-07-14 DIAGNOSIS — J9819 Other pulmonary collapse: Secondary | ICD-10-CM | POA: Insufficient documentation

## 2013-07-14 DIAGNOSIS — I2699 Other pulmonary embolism without acute cor pulmonale: Secondary | ICD-10-CM

## 2013-07-14 LAB — CBC WITH DIFFERENTIAL/PLATELET
BASOS ABS: 0.1 10*3/uL (ref 0.0–0.1)
Basophils Relative: 1 % (ref 0.0–3.0)
Eosinophils Absolute: 0.2 10*3/uL (ref 0.0–0.7)
Eosinophils Relative: 3.6 % (ref 0.0–5.0)
HEMATOCRIT: 37.8 % (ref 36.0–46.0)
HEMOGLOBIN: 12.5 g/dL (ref 12.0–15.0)
LYMPHS ABS: 1.5 10*3/uL (ref 0.7–4.0)
Lymphocytes Relative: 27.9 % (ref 12.0–46.0)
MCHC: 33.1 g/dL (ref 30.0–36.0)
MCV: 90 fl (ref 78.0–100.0)
MONO ABS: 0.4 10*3/uL (ref 0.1–1.0)
MONOS PCT: 7.8 % (ref 3.0–12.0)
Neutro Abs: 3.3 10*3/uL (ref 1.4–7.7)
Neutrophils Relative %: 59.7 % (ref 43.0–77.0)
Platelets: 309 10*3/uL (ref 150.0–400.0)
RBC: 4.2 Mil/uL (ref 3.87–5.11)
RDW: 14.8 % — AB (ref 11.5–14.6)
WBC: 5.5 10*3/uL (ref 4.5–10.5)

## 2013-07-14 LAB — BASIC METABOLIC PANEL
BUN: 11 mg/dL (ref 6–23)
CALCIUM: 9.6 mg/dL (ref 8.4–10.5)
CO2: 30 mEq/L (ref 19–32)
CREATININE: 0.7 mg/dL (ref 0.4–1.2)
Chloride: 99 mEq/L (ref 96–112)
GFR: 95.79 mL/min (ref >60.00)
Glucose, Bld: 97 mg/dL (ref 70–99)
Potassium: 3.9 mEq/L (ref 3.5–5.1)
Sodium: 136 mEq/L (ref 135–145)

## 2013-07-14 LAB — BRAIN NATRIURETIC PEPTIDE: Pro B Natriuretic peptide (BNP): 22 pg/mL (ref 0.0–100.0)

## 2013-07-14 NOTE — Progress Notes (Signed)
OFFICE NOTE  07/14/2013  CC:  Chief Complaint  Patient presents with  . Shortness of Breath   HPI: Patient is a 58 y.o. Caucasian female who is here with her husband for c/o SOB.  This is a constant complaint for her, says it has been mildly/gradually worsening for a few days--particularly yesterday.  No cough.  Reports waking up at night feeling like she has a fever a couple of nights this week, temp not checked. Legs feeling fidgety.  Seems to have "spells" of worsened breathlessness on and off, worse with ambulation. No chest pain.  Constantly feels pressure/soreness in medial parts of upper and lower legs.  No new LE rash or inflammation of any of her varicose veins.  Area of left thigh where she had superficial DVT w/cellulitis is no longer inflamed-appearing per pt report--pain in that area is no different than the other leg now. Ran out of ativan 2-3 days ago, apparently she was not aware that she has a RF on current rx. Taking oxycodone and tylenol for pain as per her pain MD.  ROS: No n/v/abd pain.  Appetite is good.  +Chronic urinary urgency/frequency/nocturia.  No dysuria, no foul smell to urine.  Went to urologist's yesterday for routine f/u of her recent percut nephrostomy/stone extraction and her urine there was normal.  She was given samples of vesicare to try, Mirbetriq to try if vesicare no help.   Past Medical History  Diagnosis Date  . Insulin resistance   . Hypertension   . Hypothyroidism   . 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 DDD (degenerative disc disease)     Dr. [name] evaluated her and recommended pain mgmt MD.  She saw Dr. Selinda Orion for pain mgmt at one time.  Marland Kitchen GERD (gastroesophageal reflux disease)   . Insomnia   . Morbid obesity     BMI 49.  Gastric stapling surgery in distant past.  . Peripheral neuropathy     No old records to confirm pt's report.  Pt refuses to have more NCS b/c test is painful.  . Carpal tunnel syndrome    bilateral  . Fibromyalgia     questionable  . Nephrolithiasis     Lithotripsy 2002.  Right percut nephrolith 05/2013.  Marland Kitchen Disequilibrium syndrome     MRI brain normal 2012  . DIZZINESS 04/12/2010    Qualifier: Diagnosis of  By: Anitra Lauth M.D., Brien Few   . Carpal tunnel syndrome on both sides 06/21/2010  . Mixed incontinence urge and stress     and nocturia x 5-6 per night  . Microscopic hematuria 2014    CT abd pelv showed 1.8 cm right renal pelvis stone.  Also had UA c/w UTI so abx given.   . Shoulder pain     HX OF BILATERAL SHOULDER SURGERIES - PT HAS VERY LIMITED ROM - ESPECIALLY RAISING HER ARMS  . Complication of anesthesia     STATES SHE WAS TOLD SHE WOKE UP DURING HER KNEE REPLACEMENT SURGERY AND HAD TO BE GIVEN MORE MEDICINE - NOT SURE IF SPINAL OR GENERAL ANESTHESIA  . Chronic venous insufficiency     with edema  and extensive varicose dz: Dr. Linus Mako is managing this, planning laser procedures.; A LOT OF LEG CRAMPS AND LEG STIFFNESS  . Asthma     NOT NEEDED INHALER IN OVER A YEAR  . Heart murmur     functional, w/u done; PALPITATIONS IN THE PAST  . Sleep apnea  PT DOES NOT HAVE MASK OR TUBING - JUST SLEEPS WITH HEAD ELEVATED ON 2 PILLOWS   . Diabetes mellitus without complication     ORAL MEDICATION  . Pulmonary embolism 05/2013    Setting: post-op percutaneous nephrolithotomy--xarelto  . Superficial thrombophlebitis of left leg 05/2013    with cellulitis   Past Surgical History  Procedure Laterality Date  . Cholecystectomy  1987  . Joint replacement  2009    Right Knee, [facility]  . Foot surgery  2000    Left tarsel tunnel release  . Hernia repair   1980's    Diaphragmatic + gastric stapling  . Hernia repair      Inguinal  . Extracorporeal shock wave lithotripsy    . Endometrial ablation      menorrhagia  . Bilateral shoulder surgery    . Nephrolithotomy Right 05/31/2013    Procedure: NEPHROLITHOTOMY PERCUTANEOUS;  Surgeon: Irine Seal, MD;   Location: [facility];  Service: Urology;  Laterality: Right;   History   Social History Narrative   Married, currently living in [place].   Occupation: no.  Disabled secondary to chronic pain (osteoarthritis in back and knees).   Exercise: staying busy for 2-3 hours a day but no formal exercise due to chronic pain.   Diet: "normal" diet, trying to increase water.   No Tob/alc/drugs.           MEDS:  Outpatient Prescriptions Prior to Visit  Medication Sig Dispense Refill  . acetaminophen (TYLENOL) 500 MG tablet Take 1,000 mg by mouth every 6 (six) hours as needed for mild pain or headache.       . levothyroxine (SYNTHROID, LEVOTHROID) 175 MCG tablet Take 175 mcg by mouth daily before breakfast.      . lisinopril (PRINIVIL,ZESTRIL) 40 MG tablet Take 40 mg by mouth every morning.      Marland Kitchen LORazepam (ATIVAN) 1 MG tablet 1 tab po qhs for sleep  30 tablet  1  . metFORMIN (GLUCOPHAGE) 500 MG tablet Take 500 mg by mouth every evening.       Marland Kitchen oxyCODONE (ROXICODONE) 15 MG immediate release tablet Take 15 mg by mouth 4 (four) times daily. FOR BACK PAIN AND ARTHRITIS AND RT KNEE PAIN      . Rivaroxaban (XARELTO) 20 MG TABS tablet Take 1 tablet (20 mg total) by mouth daily with supper. AFTER you finish the starter pack.  30 tablet  2  . Multiple Vitamin (MULTIVITAMIN WITH MINERALS) TABS tablet Take 1 tablet by mouth daily.      . vitamin B-12 (CYANOCOBALAMIN) 100 MCG tablet Take 100 mcg by mouth daily.       No facility-administered medications prior to visit.    PE: Blood pressure 136/85, pulse 78, temperature 97.9 F (36.6 C), temperature source Oral, resp. rate 20, height 5\' 4"  (1.626 m), weight 297 lb (134.718 kg), SpO2 98.00%. Oxygen after ambulation x 40 steps in hallway on RA was 92%.  When she stopped it went back to 95-96%.  HR increased from 80s to 100 with ambulation. Gen: Alert, well appearing, morbidly obese.  Patient is oriented to person, place, time, and situation. QQV:ZDGL: no  injection, icteris, swelling, or exudate.  EOMI, PERRLA. Mouth: lips without lesion/swelling.  Oral mucosa pink and moist. Oropharynx without erythema, exudate, or swelling.  Neck - No masses or thyromegaly or limitation in range of motion CV: RRR, no m/r/g.   LUNGS: CTA bilat, nonlabored resps, good aeration in all lung fields. ABD: rotund, soft  EXT: doughy edema in LL's bilat, R>L, with 2+ pitting edema on right LL and 1+ on left LL. No rash/erythema.  LABS: none today  IMPRESSION AND PLAN:  Shortness of breath Chronic, waxing and waning intensity. Doubt acute process occurring presently but will check CXR today as well as CBC, BMET, and BNP level. Suspect patient has obesity hypoventilation syndrome.  Her relatively recent PE is likely contributing a little bit to her symptoms as well. Reassured pt today and would like to have a pulmonologist see her to see if there is any further evaluation and treatment they could offer her. Wt loss is simply not likely given her LE pain and relative immobility, although I recommended wt loss/dieting to pt again today.   An After Visit Summary was printed and given to the patient.  FOLLOW UP: To be determined based on results of pending workup.

## 2013-07-14 NOTE — Assessment & Plan Note (Signed)
Chronic, waxing and waning intensity. Doubt acute process occurring presently but will check CXR today as well as CBC, BMET, and BNP level. Suspect patient has obesity hypoventilation syndrome.  Her relatively recent PE is likely contributing a little bit to her symptoms as well. Reassured pt today and would like to have a pulmonologist see her to see if there is any further evaluation and treatment they could offer her. Wt loss is simply not likely given her LE pain and relative immobility, although I recommended wt loss/dieting to pt again today.

## 2013-07-14 NOTE — Progress Notes (Signed)
Pre visit review using our clinic review tool, if applicable. No additional management support is needed unless otherwise documented below in the visit note. 

## 2013-08-08 ENCOUNTER — Other Ambulatory Visit: Payer: Self-pay | Admitting: Family Medicine

## 2013-08-09 ENCOUNTER — Telehealth: Payer: Self-pay

## 2013-08-09 DIAGNOSIS — M159 Polyosteoarthritis, unspecified: Secondary | ICD-10-CM

## 2013-08-09 NOTE — Telephone Encounter (Signed)
Error

## 2013-08-09 NOTE — Telephone Encounter (Signed)
Refill on Lorazepam. Last OV 07/14/2013. Last refill 06/13/2013

## 2013-08-09 NOTE — Telephone Encounter (Signed)
Pt left a voicemail stating she needed a referral to [facility] concerning her legs. She states we need to fax over referral to Dr [name]. Fax [phone]. Dr Anitra Lauth can we proceed with referral?

## 2013-08-09 NOTE — Telephone Encounter (Signed)
Rx faxed

## 2013-08-10 NOTE — Telephone Encounter (Signed)
LMOM that referral was placed.  

## 2013-08-10 NOTE — Telephone Encounter (Signed)
Referral order is in.

## 2013-08-19 ENCOUNTER — Other Ambulatory Visit: Payer: Self-pay | Admitting: Orthopedic Surgery

## 2013-08-19 DIAGNOSIS — M545 Low back pain, unspecified: Secondary | ICD-10-CM

## 2013-08-28 ENCOUNTER — Ambulatory Visit
Admission: RE | Admit: 2013-08-28 | Discharge: 2013-08-28 | Disposition: A | Discharge status: Home / Self Care | Payer: 59 | Source: Ambulatory Visit | Attending: Orthopedic Surgery | Admitting: Orthopedic Surgery

## 2013-08-28 DIAGNOSIS — M545 Low back pain, unspecified: Secondary | ICD-10-CM

## 2013-08-29 ENCOUNTER — Other Ambulatory Visit: Payer: Self-pay | Admitting: Orthopedic Surgery

## 2013-08-29 DIAGNOSIS — M545 Low back pain, unspecified: Secondary | ICD-10-CM

## 2013-08-30 ENCOUNTER — Telehealth: Payer: Self-pay | Admitting: Family Medicine

## 2013-08-30 NOTE — Telephone Encounter (Signed)
Patient did not seem sure about what exactly Dr. [name] had planned.  I gave her our fax number and told her to contact that office and have them contact us regarding details of treatments and when they wanted her to stop xarelto.

## 2013-08-30 NOTE — Telephone Encounter (Signed)
Pls tell pt I must have more details about what "treatment" means (injection in back, ?surgery on back?).  When is this planned (approximately)?-Let me know

## 2013-08-30 NOTE — Telephone Encounter (Signed)
Patient states she saw Dr. [name] and he did MRI on her back and she states that she needs to be off of her blood thinner x 1 week before they do any kind of treatment for her back.  Please advise if patient can go off xarelto.

## 2013-08-31 ENCOUNTER — Encounter: Payer: Self-pay | Admitting: Family Medicine

## 2013-08-31 NOTE — Telephone Encounter (Signed)
Pt aware of message. Pt voiced understanding.  She will continue her anticoagulant unless otherwise instructed by Dr. [name] or [place].

## 2013-08-31 NOTE — Telephone Encounter (Signed)
LMOM for pt to return call.  We did receive OV notes from Dr. [name] but it didn't mention anything about pt stopping anticoagulant at this time or any upcoming treatment. Per Dr Anitra Lauth, if Dr. Sammuel Hines office needs assistance from him to stop pt's anticoagulant that office will contact us and the patient won't need to do so for them.

## 2013-09-15 ENCOUNTER — Other Ambulatory Visit: Payer: Self-pay | Admitting: *Deleted

## 2013-09-15 DIAGNOSIS — I83893 Varicose veins of bilateral lower extremities with other complications: Secondary | ICD-10-CM

## 2013-09-21 ENCOUNTER — Ambulatory Visit (INDEPENDENT_AMBULATORY_CARE_PROVIDER_SITE_OTHER): Payer: 59 | Admitting: Family Medicine

## 2013-09-21 ENCOUNTER — Encounter: Payer: Self-pay | Admitting: Family Medicine

## 2013-09-21 VITALS — BP 128/87 | HR 79 | Temp 98.8°F | Resp 18 | Ht 64.0 in | Wt 303.0 lb

## 2013-09-21 DIAGNOSIS — E1142 Type 2 diabetes mellitus with diabetic polyneuropathy: Secondary | ICD-10-CM

## 2013-09-21 DIAGNOSIS — R0989 Other specified symptoms and signs involving the circulatory and respiratory systems: Secondary | ICD-10-CM

## 2013-09-21 DIAGNOSIS — E1149 Type 2 diabetes mellitus with other diabetic neurological complication: Secondary | ICD-10-CM

## 2013-09-21 DIAGNOSIS — Z86711 Personal history of pulmonary embolism: Secondary | ICD-10-CM

## 2013-09-21 DIAGNOSIS — R0609 Other forms of dyspnea: Secondary | ICD-10-CM

## 2013-09-21 DIAGNOSIS — E039 Hypothyroidism, unspecified: Secondary | ICD-10-CM

## 2013-09-21 LAB — TSH: TSH: 5.76 u[IU]/mL — AB (ref 0.35–4.50)

## 2013-09-21 LAB — HEMOGLOBIN A1C: Hgb A1c MFr Bld: 6.1 % (ref 4.6–6.5)

## 2013-09-21 NOTE — Progress Notes (Signed)
OFFICE NOTE  09/21/2013  CC:  Chief Complaint  Patient presents with  . Follow-up     HPI: Patient is a 58 y.o. Caucasian female who is here for 2 mo f/u chronic SOB. Feels no difference in SOB, essentially feels it randomly when at rest but consistently with walking.  No wheezing or cough.  No chest pain.  No dizziness, presyncope, or palpitations.  I suspect she has obesity hypoventilation syndrome and I ordered referral to [facility] last o/v but this never got arranged.    Also hypothyroidism and HTN: compliant with meds.  Always feels tired, esp in mornings. . Chronic pain syndrome/narcotics managed by pain mgmt md.  Due for foot exam and eye exam/DR screening.  Denies vision c/o but has long history of decreased sensation in LE's, documented periph neuropathy by NCS in remote past at outside office--records never received. No burning in feet, no ulcerations.  Pertinent PMH:  Past medical, surgical, social, and family history reviewed and no changes are noted since last office visit.   MEDS:  Outpatient Prescriptions Prior to Visit  Medication Sig Dispense Refill  . acetaminophen (TYLENOL) 500 MG tablet Take 1,000 mg by mouth every 6 (six) hours as needed for mild pain or headache.       . levothyroxine (SYNTHROID, LEVOTHROID) 175 MCG tablet Take 175 mcg by mouth daily before breakfast.      . lisinopril (PRINIVIL,ZESTRIL) 40 MG tablet Take 40 mg by mouth every morning.      Marland Kitchen LORazepam (ATIVAN) 1 MG tablet TAKE ONE TABLET BY MOUTH AT BEDTIME AS NEEDED FOR SLEEP   30 tablet  5  . metFORMIN (GLUCOPHAGE) 500 MG tablet Take 500 mg by mouth every evening.       Marland Kitchen oxyCODONE (ROXICODONE) 15 MG immediate release tablet Take 15 mg by mouth 4 (four) times daily. FOR BACK PAIN AND ARTHRITIS AND RT KNEE PAIN      . Rivaroxaban (XARELTO) 20 MG TABS tablet Take 1 tablet (20 mg total) by mouth daily with supper. AFTER you finish the starter pack.  30 tablet  2  . Multiple Vitamin  (MULTIVITAMIN WITH MINERALS) TABS tablet Take 1 tablet by mouth daily.      . solifenacin (VESICARE) 5 MG tablet Take 5 mg by mouth daily.      . vitamin B-12 (CYANOCOBALAMIN) 100 MCG tablet Take 100 mcg by mouth daily.       No facility-administered medications prior to visit.   PE: Blood pressure 128/87, pulse 79, temperature 98.8 F (37.1 C), temperature source Temporal, resp. rate 18, height 5\' 4"  (1.626 m), weight 303 lb (137.44 kg), SpO2 95.00%. Gen: Alert, well appearing.  Patient is oriented to person, place, time, and situation. AFFECT: pleasant, lucid thought and speech. IEP:PIRJ: no injection, icteris, swelling, or exudate.  EOMI, PERRLA. Mouth: lips without lesion/swelling.  Oral mucosa pink and moist. Oropharynx without erythema, exudate, or swelling.  CV: RRR, no m/r/g.   LUNGS: CTA bilat, nonlabored resps, good aeration in all lung fields. EXT: varicosities and some doughy edema but no pitting edema. Full diabetic foot exam done: diminished sensation in both feet on monofilament testing, JPS intact bilat, decreased vibratory sens R foot  IMPRESSION AND PLAN:  1) Chronic SOB (long before she ever had a PE): suspect obesity hypoventilation syndrome. Pulm eval has now been set up for the first week of July for their expert opinion and see what/if there is anything further to offer her.  2) PE:  she is now approx 3 mo anticoagulated.  Her PE came in post-op environment.  Anticipate d/c of xarelto after 66mo of treatment.  3) DM 2, has had good A1c control but has DPN. Feet examined today: stable. HbA1c drawn today. She is reminded of need for annual diab retpthy sceening exams.  4) Hypothyroidism: TSH today.  An After Visit Summary was printed and given to the patient.  FOLLOW UP: 3 mo

## 2013-09-23 ENCOUNTER — Telehealth: Payer: Self-pay | Admitting: *Deleted

## 2013-09-23 NOTE — Telephone Encounter (Signed)
Message copied by Julieta Bellini on Fri Sep 23, 2013 10:44 AM ------      Message from: Tammi Sou      Created: Fri Sep 23, 2013  8:08 AM       Pls call pt and tell her that her diabetes number was excellent: 6.1%--same as 1 yr ago.      Also, her thyroid level was just a LITTLE bit low.  Ask if she has been taking her thyroid med every day, and ask if she takes it with any vitamins or other meds or with food.-thx ------

## 2013-09-23 NOTE — Telephone Encounter (Signed)
Patient stated that she has been taking her thyroid med early in the am by itself. Pt is only taking calcium 600 mg and just started taking this recently.

## 2013-09-24 NOTE — Telephone Encounter (Signed)
Ok.  Tell her her thyroid level is a little low. Pls do eRx for increased dose of levothyroxine: do 200 mcg tabs, 1 tab po qd, #30, RF x 2, recheck TSH in 6-8 weeks (lab visit only, dx is hypothyroidism).-thx

## 2013-09-26 MED ORDER — LEVOTHYROXINE SODIUM 200 MCG PO TABS: 200.0000 ug | ORAL_TABLET | Freq: Every day | ORAL | Status: DC

## 2013-09-26 NOTE — Addendum Note (Signed)
Addended by: Jannette Spanner on: 09/26/2013 03:15 PM   Modules accepted: Orders

## 2013-09-26 NOTE — Telephone Encounter (Signed)
Pt came in and asked about her medication change. I sent in new RX for her thyroid.

## 2013-10-04 ENCOUNTER — Institutional Professional Consult (permissible substitution): Payer: 59 | Admitting: Internal Medicine

## 2013-10-04 ENCOUNTER — Other Ambulatory Visit: Payer: Self-pay | Admitting: Internal Medicine

## 2013-10-06 ENCOUNTER — Other Ambulatory Visit: Payer: Self-pay | Admitting: Family Medicine

## 2013-10-06 MED ORDER — RIVAROXABAN 20 MG PO TABS: 20.0000 mg | ORAL_TABLET | Freq: Every day | ORAL | Status: DC

## 2013-10-07 ENCOUNTER — Ambulatory Visit (INDEPENDENT_AMBULATORY_CARE_PROVIDER_SITE_OTHER): Payer: 59 | Admitting: Internal Medicine

## 2013-10-07 ENCOUNTER — Encounter: Payer: Self-pay | Admitting: Internal Medicine

## 2013-10-07 VITALS — BP 114/76 | HR 68 | Temp 98.2°F | Ht 64.5 in | Wt 309.0 lb

## 2013-10-07 DIAGNOSIS — I10 Essential (primary) hypertension: Secondary | ICD-10-CM

## 2013-10-07 DIAGNOSIS — R0602 Shortness of breath: Secondary | ICD-10-CM

## 2013-10-07 MED ORDER — AMLODIPINE-OLMESARTAN 5-20 MG PO TABS: 1.0000 | ORAL_TABLET | Freq: Every day | ORAL | Status: DC

## 2013-10-07 NOTE — Progress Notes (Signed)
Subjective:    Patient ID: Tricia Perry, female    DOB: [birth year]  MRN: [id number]  HPI   31 yowf  03/1998 at [facility] with no resp problems with dx of asthma in mid 90's inhalers transiently used inhalers but stopped them s flare and then March 2015 preop for kidney stones breathing worse and required treatments p surgery seemed better with treatments in hospital then worse the next day since then.  10/07/2013 1st [facility] office visit/ Owain Eckerman  Chief Complaint  Patient presents with  . Pulmonary Consult    Referred per Dr. Anitra Lauth.  Pt c/o SOB for the past 6 months- gets SOB with exertion and at rest.  She also c/o "wheezing cough"- non prod and mainly in the late evenings.   if sob at rest, gets up and walks around and feels better. Sleeps fine p sleeping pill  Assoc with dry cough and "other people hear me wheezing" Onset was indolent minimally progressive until abruptly worse p ET,  Pattern continues to be variable but no obvious  Patterns in day to day or daytime variabilty or assoc  cp or chest tightness, subjective wheeze overt sinus or hb symptoms. No unusual exp hx or h/o childhood pna/ asthma or knowledge of premature birth.  Sleeping ok without nocturnal  or early am exacerbation  of respiratory  c/o's or need for noct saba. Also denies any obvious fluctuation of symptoms with weather or environmental changes or other aggravating or alleviating factors except as outlined above   Current Medications, Allergies, Complete Past Medical History, Past Surgical History, Family History, and Social History were reviewed in [employer] record.           Review of Systems  Constitutional: Negative for fever, chills and unexpected weight change.  HENT: Negative for congestion, dental problem, ear pain, nosebleeds, postnasal drip, rhinorrhea, sinus pressure, sneezing, sore throat, trouble swallowing and voice change.   Eyes: Negative for visual disturbance.    Respiratory: Positive for shortness of breath. Negative for cough and choking.   Cardiovascular: Negative for chest pain and leg swelling.  Gastrointestinal: Negative for vomiting, abdominal pain and diarrhea.  Genitourinary: Negative for difficulty urinating.  Musculoskeletal: Positive for arthralgias.  Skin: Negative for rash.  Neurological: Negative for tremors, syncope and headaches.  Hematological: Does not bruise/bleed easily.       Objective:   Physical Exam     Wt Readings from Last 3 Encounters:  10/07/13 309 lb (140.161 kg)  09/21/13 303 lb (137.44 kg)  07/14/13 297 lb (134.718 kg)      Obese amb wf not able to get on table even with assistance due to "bad knees" and classic pseudowheeze filling mod sob at rest at visit with mild voice fatigue  HEENT: nl dentition, turbinates, and orophanx. Nl external ear canals without cough reflex   NECK :  without JVD/Nodes/TM/ nl carotid upstrokes bilaterally   LUNGS: no acc muscle use, clear to A and P bilaterally without cough on insp or exp maneuvers   CV:  RRR  no s3 or murmur or increase in P2, no edema   ABD:  soft and nontender with nl excursion in the supine position. No bruits or organomegaly, bowel sounds nl  MS:  warm without deformities, calf tenderness, cyanosis or clubbing  SKIN: warm and dry without lesions    NEURO:  alert, approp, no deficits     cxr 07/14/13 Mild right basilar atelectasis  Assessment & Plan:

## 2013-10-07 NOTE — Patient Instructions (Signed)
Stop lisinopril  Start azor 5/20 one daily   Please schedule a follow up office visit in 4 weeks, sooner if needed to see Tammy NP to recheck blood pressure and make sure your breathing is better

## 2013-10-08 NOTE — Assessment & Plan Note (Addendum)
ACE inhibitors are problematic in  pts with airway complaints because  even experienced pulmonologists can't always distinguish ace effects from copd/asthma.  By themselves they don't actually cause a problem, much like oxygen can't by itself start a fire, but they certainly serve as a powerful catalyst or enhancer for any "fire"  or inflammatory process in the upper airway, be it caused by an ET  Tube (as may have been the recent case with her) or more commonly reflux (especially in the obese or pts with known GERD or who are on biphoshonates).    In the era of ARB near equivalency until we have a better handle on the reversibility of the airway problem, it just makes sense to avoid ACEI  entirely in the short run and then decide later, having established a level of airway control using a reasonable limited regimen, whether to add back ace but even then being very careful to observe the pt for worsening airway control and number of meds used/ needed to control symptoms.    Try azor samples 5/20 to replace lisinopril 40 daily x 4 weeks then regroup

## 2013-10-08 NOTE — Assessment & Plan Note (Addendum)
Spriometry  10/07/13   FEV1  1.67  ( 66% )  Ratio 72   Symptoms are markedly disproportionate to objective findings and not clear this is a lung problem but pt does appear to have difficult airway management issues. DDX of  difficult airways management all start with A and  include Adherence, Ace Inhibitors, Acid Reflux, Active Sinus Disease, Alpha 1 Antitripsin deficiency, Anxiety masquerading as Airways dz,  ABPA,  allergy(esp in young), Aspiration (esp in elderly), Adverse effects of DPI,  Active smokers, plus two Bs  = Bronchiectasis and Beta blocker use..and one C= CHF  Adherence is always the initial "prime suspect" and is a multilayered concern that requires a "trust but verify" approach in every patient - starting with knowing how to use medications, especially inhalers, correctly, keeping up with refills and understanding the fundamental difference between maintenance and prns vs those medications only taken for a very short course and then stopped and not refilled.   ACEi at top of list as her hx of sob at rest and not at hs is not c/w any resp or cardiac cause.  Will see again in one m to re-eval

## 2013-10-11 ENCOUNTER — Telehealth: Payer: Self-pay | Admitting: Family Medicine

## 2013-10-11 NOTE — Telephone Encounter (Signed)
Pt called stating that she needed an approval to get an injection in her back.  Patient states she thought she had asked for this before but wasn't sure.  She had to cancel her appointment with Dr today because she didn't have authorization from you to get injection.  Please advise.

## 2013-10-11 NOTE — Telephone Encounter (Signed)
Reassure pt.  This test came back ONE point high and was likely simply a reflection of mild dehydration. Her kidney function is normal.  Her insurance company only gives numbers and they don't interpret results.-thx

## 2013-10-12 NOTE — Telephone Encounter (Signed)
LMOM fo pt to CB.

## 2013-10-12 NOTE — Telephone Encounter (Signed)
Patient aware.

## 2013-10-12 NOTE — Telephone Encounter (Signed)
Disregard my initial response to this question, as it was entered in her chart in error.  Pls notify Shakena that I recommend AGAINST a shot in her back until she has finished 6 months of her xarelto (her blood thinner that she is on for her pulmonary embolus that she had back in March this year).  So, after 12/01/13 she can stop xarelto and then proceed with a back injection after she waits 48 hours after her last dose of xarelto. Thanks.

## 2013-11-10 ENCOUNTER — Ambulatory Visit (INDEPENDENT_AMBULATORY_CARE_PROVIDER_SITE_OTHER): Payer: 59 | Admitting: Adult Health

## 2013-11-10 ENCOUNTER — Telehealth: Payer: Self-pay | Admitting: Internal Medicine

## 2013-11-10 ENCOUNTER — Encounter: Payer: Self-pay | Admitting: Adult Health

## 2013-11-10 ENCOUNTER — Other Ambulatory Visit (INDEPENDENT_AMBULATORY_CARE_PROVIDER_SITE_OTHER): Payer: 59

## 2013-11-10 VITALS — BP 122/66 | HR 95 | Temp 97.9°F | Ht 64.5 in | Wt 307.2 lb

## 2013-11-10 DIAGNOSIS — R0602 Shortness of breath: Secondary | ICD-10-CM

## 2013-11-10 DIAGNOSIS — I10 Essential (primary) hypertension: Secondary | ICD-10-CM

## 2013-11-10 LAB — CBC WITH DIFFERENTIAL/PLATELET
BASOS ABS: 0 10*3/uL (ref 0.0–0.1)
Basophils Relative: 0.8 % (ref 0.0–3.0)
EOS ABS: 0.2 10*3/uL (ref 0.0–0.7)
Eosinophils Relative: 3.7 % (ref 0.0–5.0)
HEMATOCRIT: 38.9 % (ref 36.0–46.0)
Hemoglobin: 12.8 g/dL (ref 12.0–15.0)
LYMPHS ABS: 2.1 10*3/uL (ref 0.7–4.0)
LYMPHS PCT: 34.4 % (ref 12.0–46.0)
MCHC: 32.8 g/dL (ref 30.0–36.0)
MCV: 90.7 fl (ref 78.0–100.0)
Monocytes Absolute: 0.5 10*3/uL (ref 0.1–1.0)
Monocytes Relative: 8.1 % (ref 3.0–12.0)
NEUTROS ABS: 3.2 10*3/uL (ref 1.4–7.7)
Neutrophils Relative %: 53 % (ref 43.0–77.0)
Platelets: 297 10*3/uL (ref 150.0–400.0)
RBC: 4.29 Mil/uL (ref 3.87–5.11)
RDW: 14.1 % (ref 11.5–15.5)
WBC: 6 10*3/uL (ref 4.0–10.5)

## 2013-11-10 LAB — BASIC METABOLIC PANEL
BUN: 19 mg/dL (ref 6–23)
CO2: 30 mEq/L (ref 19–32)
Calcium: 9.8 mg/dL (ref 8.4–10.5)
Chloride: 100 mEq/L (ref 96–112)
Creatinine, Ser: 0.6 mg/dL (ref 0.4–1.2)
GFR: 104.64 mL/min (ref >60.00)
Glucose, Bld: 95 mg/dL (ref 70–99)
Potassium: 4.1 mEq/L (ref 3.5–5.1)
Sodium: 137 mEq/L (ref 135–145)

## 2013-11-10 LAB — BRAIN NATRIURETIC PEPTIDE: PRO B NATRI PEPTIDE: 37 pg/mL (ref 0.0–100.0)

## 2013-11-10 MED ORDER — VALSARTAN 160 MG PO TABS: 160.0000 mg | ORAL_TABLET | Freq: Every day | ORAL | Status: DC

## 2013-11-10 MED ORDER — FUROSEMIDE 20 MG PO TABS: 20.0000 mg | ORAL_TABLET | Freq: Every day | ORAL | Status: DC

## 2013-11-10 NOTE — Telephone Encounter (Signed)
Verified with TP that it should indeed be just # 3 tablets  Spoke with the pharmacist and made aware  Nothing further needed

## 2013-11-10 NOTE — Patient Instructions (Addendum)
Remain off Azor  Begin Diovan 160mg  daily in place of your Lisinopril.  Lasix 20mg  daily for 3 days , eat bananas daily for 3 days.  Please follow up with Primary care doctor in 4 week for your blood pressure ,  Would avoid ACE inhibitors in future as they can make a cough worse.  Please return in 3-4 weeks for PFT  Labs today  We are setting you up for a ONO to check your oxygen while you sleep.

## 2013-11-10 NOTE — Assessment & Plan Note (Addendum)
Dyspnea ? Etiology , suspect this is multifactoral w/ obesity, ACE, ?OHS/OSA w/ nocuturnal desats ?  ? Fluid retention secondary to Norvasc /chronic venous insufficiency, will give gentle diuresis , check bnp  Previous echo w/ preserved EF , no sign diastolic dysfunction , sl elevated PAP~33.  Will remain off ACE as may contribute to upper airway cough  Substitute with ARB. -check bmet.  Needs PFT  Declines CPAP , check ONO for desats.   Plan  Remain off Azor  Begin Diovan 160mg  daily in place of your Lisinopril.  Lasix 20mg  daily for 3 days , eat bananas daily for 3 days.  Please follow up with Primary care doctor in 4 week for your blood pressure ,  Would avoid ACE inhibitors in future as they can make a cough worse.  Please return in 3-4 weeks for PFT  Labs today  We are setting you up for a ONO to check your oxygen while you sleep.

## 2013-11-10 NOTE — Assessment & Plan Note (Signed)
Will remain off ACE as may contribute to upper airway cough  Substitute with ARB. -check bmet.   Plan  Remain off Azor  Begin Diovan 160mg  daily in place of your Lisinopril.  Lasix 20mg  daily for 3 days , eat bananas daily for 3 days.  Please follow up with Primary care doctor in 4 week for your blood pressure ,  Would avoid ACE inhibitors in future as they can make a cough worse.  Please return in 3-4 weeks for PFT  Labs today  We are setting you up for a ONO to check your oxygen while you sleep.

## 2013-11-10 NOTE — Progress Notes (Signed)
Subjective:    Patient ID: Tricia Perry, female    DOB: 21-May-1955  MRN: [id number]  HPI   54 yowf  03/1998 at [facility] with no resp problems with dx of asthma in mid 90's inhalers transiently used inhalers but stopped them s flare and then March 2015 preop for kidney stones breathing worse and required treatments p surgery seemed better with treatments in hospital then worse the next day since then.  10/07/2013 1st Tricia Perry Pulmonary office visit/ Tricia Perry  Chief Complaint  Patient presents with  . Pulmonary Consult    Referred per Dr. Anitra Perry.  Pt c/o SOB for the past 6 months- gets SOB with exertion and at rest.  She also c/o "wheezing cough"- non prod and mainly in the late evenings.   if sob at rest, gets up and walks around and feels better. Sleeps fine p sleeping pill  Assoc with dry cough and "other people hear me wheezing" Onset was indolent minimally progressive until abruptly worse p ET,  Pattern continues to be variable but no obvious  Patterns in day to day or daytime variabilty or assoc  cp or chest tightness, subjective wheeze overt sinus or hb symptoms. No unusual exp hx or h/o childhood pna/ asthma or knowledge of premature birth. >>stop Lisinopril , rx AZOR   11/10/2013 Follow up  Pt returns for 1 month follow up for Cough and Dyspnea.  Seen last ov with shortness of breath for >46months along with cough.  Felt started after surgery in 05/2013 .  Hx of PE , on Xarelto. 05/2013.  Last ov , ACE stopped d/t cough and upper airway wheezing.  Placed on AZOR samples , however noticed legs more swollen . Has hx of chronic leg edema and venous insufficiency.  Did not notice change in her breathing or cough since last ov.  Says she has hx of OSA in past but did not wear CPAP .  Does feel tired in am and snores. Declines sleep study, agrees to ONO .  Today in office no desats with walking.  Spirometry last ov showed mild restrictive lung disease w/ no airflow obstruction.  No hemoptysis,  chest pain, orthopnea, fever, n/v/d.   Current Medications, Allergies, Complete Past Medical History, Past Surgical History, Family History, and Social History were reviewed in [employer] record.           Review of Systems  Constitutional: Negative for fever, chills and unexpected weight change.  HENT: Negative for congestion, dental problem, ear pain, nosebleeds, postnasal drip, rhinorrhea, sinus pressure, sneezing, sore throat, trouble swallowing and voice change.   Eyes: Negative for visual disturbance.  Respiratory: Positive for shortness of breath. Negative for cough and choking.   Cardiovascular: Negative for chest pain and leg swelling.  Gastrointestinal: Negative for vomiting, abdominal pain and diarrhea.  Genitourinary: Negative for difficulty urinating.  Musculoskeletal: Positive for arthralgias.  Skin: Negative for rash.  Neurological: Negative for tremors, syncope and headaches.  Hematological: Does not bruise/bleed easily.       Objective:   Physical Exam        Obese amb wf not able to get on table even with assistance due to "bad knees"   HEENT: nl dentition, turbinates, and orophanx. Nl external ear canals without cough reflex   NECK :  without JVD/Nodes/TM/ nl carotid upstrokes bilaterally   LUNGS: no acc muscle use, clear to A and P bilaterally without cough on insp or exp maneuvers   CV:  RRR  no s3 or murmur or increase in P2, 1+ edema , venous insufficiency changes noted.   ABD:  soft and nontender with nl excursion in the supine position. No bruits or organomegaly, bowel sounds nl  MS:  warm without deformities, calf tenderness, cyanosis or clubbing  SKIN: warm and dry without lesions    NEURO:  alert, approp, no deficits     cxr 07/14/13 Mild right basilar atelectasis      Assessment & Plan:

## 2013-11-11 NOTE — Progress Notes (Signed)
Chart and ov note revewed, agree fully with a/p

## 2013-11-14 ENCOUNTER — Telehealth: Payer: Self-pay | Admitting: Internal Medicine

## 2013-11-14 NOTE — Telephone Encounter (Signed)
Notes Recorded by Melvenia Needles, NP on 11/11/2013 at 5:17 PM Labs are ok , cont w/ ov recs Please contact office for sooner follow up if symptoms do not improve or worsen or seek emergency care ---  I spoke with patient about results and she verbalized understanding and had no questions

## 2013-11-15 ENCOUNTER — Encounter: Payer: Self-pay | Admitting: Vascular Surgery

## 2013-11-16 ENCOUNTER — Ambulatory Visit (INDEPENDENT_AMBULATORY_CARE_PROVIDER_SITE_OTHER): Payer: 59 | Admitting: Vascular Surgery

## 2013-11-16 ENCOUNTER — Ambulatory Visit (HOSPITAL_COMMUNITY)
Admission: RE | Admit: 2013-11-16 | Discharge: 2013-11-16 | Disposition: A | Discharge status: Home / Self Care | Payer: 59 | Source: Ambulatory Visit | Attending: Vascular Surgery | Admitting: Vascular Surgery

## 2013-11-16 ENCOUNTER — Encounter: Payer: Self-pay | Admitting: Vascular Surgery

## 2013-11-16 VITALS — BP 141/96 | HR 74 | Ht 64.5 in | Wt 305.0 lb

## 2013-11-16 DIAGNOSIS — I83893 Varicose veins of bilateral lower extremities with other complications: Secondary | ICD-10-CM | POA: Diagnosis not present

## 2013-11-16 NOTE — Assessment & Plan Note (Signed)
This patient has significant varicose veins bilaterally but more significantly on the left side. She does have significant reflux in the left greater saphenous vein. We have discussed the importance and intermittent leg elevation however she states that she cannot lie flat. I have written her a prescription for a thigh-high stocking with a pressure gradient of 20-30. We've also discussed potentially doing water aerobics which I think is helpful for patients with venous disease. I have encouraged her to stay as active as possible and to avoid prolonged sitting and standing. If her symptoms progress think she could potentially be considered for laser ablation of the left greater saphenous vein and segmental excision of varicose veins of the left lower extremity. She certainly would be at increased risk for any procedure given her obesity. We will see her back as needed. I believe her to Xarelto is to be discontinued in September.

## 2013-11-16 NOTE — Progress Notes (Signed)
Patient ID: Tricia Perry, female   DOB: [birth year], 58 y.o.   MRN: [id number]  Reason for Consult: Varicose Veins   Referred by Tammi Sou, MD  Subjective:     HPI:  Tricia Perry is a 58 y.o. female who presents for evaluation of bilateral lower extremity varicose veins. She states that she has had problems with varicose veins for 10-15 years. Her symptoms are more significant on the left side. She experiences aching pain and heaviness in both lower extremities which is aggravated by standing and relieved somewhat with elevation although she has a hard time lying flat. She did have one episode of bleeding from a small varicose vein in her left foot 3-4 years ago. She states that she had problems in the past getting fitted for compression stockings because of her obesity  I reviewed her hospital records and noted that she was admitted in March with a renal stone. This was complicated by a pulmonary embolus documented by chest CTA. She was started on a heparin drip and developed recurrent hematuria. She was ultimately started on Xarelto. During that admission she did have a venous duplex scan on 06/08/2013 which showed no evidence of DVT involving the right lower extremity and left lower extremity. There was some superficial thrombus noted in a varicose vein in the left distal thigh and proximal calf.  Of note she has also had previous right total knee replacement done in [place].  Past Medical History  Diagnosis Date  . Insulin resistance   . Hypertension   . Hypothyroidism   . 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 DDD (degenerative disc disease)     Dr. [name] evaluated her and recommended pain mgmt MD.  She saw Dr. Selinda Orion for pain mgmt at one time.  Marland Kitchen GERD (gastroesophageal reflux disease)   . Insomnia   . Morbid obesity     BMI 49.  Gastric stapling surgery in distant past.  . Peripheral neuropathy     No old records to confirm pt's report.  Pt  refuses to have more NCS b/c test is painful.  . Fibromyalgia     questionable  . Nephrolithiasis     Lithotripsy 2002.  Right percut nephrolith 05/2013.  Marland Kitchen Disequilibrium syndrome     MRI brain normal 2012  . DIZZINESS 04/12/2010    Qualifier: Diagnosis of  By: Anitra Lauth M.D., Brien Few   . Carpal tunnel syndrome on both sides 06/21/2010  . Mixed incontinence urge and stress     and nocturia x 5-6 per night  . Microscopic hematuria 2014    CT abd pelv showed 1.8 cm right renal pelvis stone.  Also had UA c/w UTI so abx given.   . Shoulder pain     HX OF BILATERAL SHOULDER SURGERIES - PT HAS VERY LIMITED ROM - ESPECIALLY RAISING HER ARMS  . Complication of anesthesia     STATES SHE WAS TOLD SHE WOKE UP DURING HER KNEE REPLACEMENT SURGERY AND HAD TO BE GIVEN MORE MEDICINE - NOT SURE IF SPINAL OR GENERAL ANESTHESIA  . Chronic venous insufficiency     with edema  and extensive varicose dz: Dr. Linus Mako is managing this, planning laser procedures.; A LOT OF LEG CRAMPS AND LEG STIFFNESS  . Asthma     NOT NEEDED INHALER IN OVER A YEAR  . Heart murmur     functional, w/u done; PALPITATIONS IN THE PAST  . Sleep apnea  PT DOES NOT HAVE MASK OR TUBING - JUST SLEEPS WITH HEAD ELEVATED ON 2 PILLOWS   . Diabetes mellitus without complication     ORAL MEDICATION  . Pulmonary embolism 05/2013    Right sided.  Setting: post-op percutaneous nephrolithotomy--xarelto  . Superficial thrombophlebitis of left leg 05/2013    with cellulitis--resolved approp with abx and ice  . Spinal stenosis     with L5 radiculopathy, also pseudospondylolisthesis per Dr. [name]  . DVT (deep venous thrombosis)    Family History  Problem Relation Age of Onset  . Hypertension Mother   . Cancer Mother     Brain/Lung/ smoker  . Diabetes Mother   . Stroke Father   . Hypertension Father   . ADD / ADHD Daughter     ADHD  . Thyroid disease Brother   . Hypertension Brother   . Varicose Veins Brother   .  Peripheral vascular disease Brother   . Heart attack Maternal Grandfather   . Alcohol abuse Paternal Grandfather    Past Surgical History  Procedure Laterality Date  . Cholecystectomy  1987  . Joint replacement  2009    Right Knee, [facility]  . Foot surgery  2000    Left tarsel tunnel release  . Hernia repair   1980's    Diaphragmatic + gastric stapling  . Hernia repair      Inguinal  . Extracorporeal shock wave lithotripsy    . Endometrial ablation      menorrhagia  . Bilateral shoulder surgery    . Nephrolithotomy Right 05/31/2013    Procedure: NEPHROLITHOTOMY PERCUTANEOUS;  Surgeon: Irine Seal, MD;  Location: [facility];  Service: Urology;  Laterality: Right;   Short Social History:  History  Substance Use Topics  . Smoking status: Former Smoker -- 1.00 packs/day for 5 years    Types: Cigarettes    Quit date: 03/31/1998  . Smokeless tobacco: Never Used  . Alcohol Use: Yes     Comment: social   Allergies  Allergen Reactions  . Codeine Itching    Back hurts  . Mold Extract [Trichophyton] Nausea And Vomiting and Other (See Comments)    Sneezing real bad   . Morphine Itching    back hurts   Current Outpatient Prescriptions  Medication Sig Dispense Refill  . acetaminophen (TYLENOL) 500 MG tablet Take 1,000 mg by mouth every 6 (six) hours as needed for mild pain or headache.       . furosemide (LASIX) 20 MG tablet Take 1 tablet (20 mg total) by mouth daily.  3 tablet  0  . levothyroxine (SYNTHROID) 200 MCG tablet Take 1 tablet (200 mcg total) by mouth daily before breakfast.  30 tablet  2  . LORazepam (ATIVAN) 1 MG tablet TAKE ONE TABLET BY MOUTH AT BEDTIME AS NEEDED FOR SLEEP   30 tablet  5  . metFORMIN (GLUCOPHAGE) 500 MG tablet Take 500 mg by mouth every evening.       Marland Kitchen oxyCODONE (ROXICODONE) 15 MG immediate release tablet Take 15 mg by mouth 4 (four) times daily. FOR BACK PAIN AND ARTHRITIS AND RT KNEE PAIN      . rivaroxaban (XARELTO) 20 MG TABS tablet Take 1 tablet  (20 mg total) by mouth daily with supper. AFTER you finish the starter pack.  30 tablet  2  . valsartan (DIOVAN) 160 MG tablet Take 1 tablet (160 mg total) by mouth daily.  30 tablet  1   No current facility-administered medications for this  visit.   Review of Systems  Constitutional: Negative for chills and fever.  Eyes: Negative for loss of vision.  Respiratory: Positive for wheezing. Negative for cough.  Cardiovascular: Positive for dyspnea with exertion, leg swelling and orthopnea. Negative for chest pain, chest tightness, claudication and palpitations.  GI: Negative for blood in stool and vomiting.  GU: Negative for dysuria and hematuria.  Musculoskeletal: Negative for leg pain, joint pain and myalgias.  Skin: Negative for rash and wound.  Neurological: Negative for dizziness and speech difficulty.  Hematologic: Negative for bruises/bleeds easily. Psychiatric: Negative for depressed mood.       Objective:  Objective  Filed Vitals:   11/16/13 1437  BP: 141/96  Pulse: 74  Height: 5' 4.5" (1.638 m)  Weight: 305 lb (138.347 kg)  SpO2: 100%   Body mass index is 51.56 kg/(m^2).  Physical Exam  Constitutional: She is oriented to person, place, and time. She appears well-developed and well-nourished.  HENT:  Head: Normocephalic and atraumatic.  Neck: Neck supple. No JVD present. No thyromegaly present.  Cardiovascular: Normal rate, regular rhythm and normal heart sounds.  Exam reveals no friction rub.   No murmur heard. Pulmonary/Chest: Breath sounds normal. She has no wheezes. She has no rales.  Abdominal: Soft. Bowel sounds are normal. There is no tenderness.  I do not palpate an aneurysm.  Musculoskeletal: Normal range of motion. She exhibits no edema.  Lymphadenopathy:    She has no cervical adenopathy.  Neurological: She is alert and oriented to person, place, and time. She has normal strength. No sensory deficit.  Skin: No lesion and no rash noted.  She has  significant verrucous veins in her anterior and medial left thigh distally and left leg. She also has varicose veins in her right anterior leg.   Psychiatric: She has a normal mood and affect.   Data: I have independently interpreted her venous duplex scan today. On the left side, there is noted to be nonocclusive superficial thrombophlebitis in a varicose vein in the distal left side. The greater saphenous vein has significant reflux in the thigh. There was no evidence of DVT. There was reflux in the deep veins.   On the right side imaging was suboptimal but there was no significant reflux in the right greater saphenous vein.     Assessment/Plan:    Varicose veins of lower extremities with other complications This patient has significant varicose veins bilaterally but more significantly on the left side. She does have significant reflux in the left greater saphenous vein. We have discussed the importance and intermittent leg elevation however she states that she cannot lie flat. I have written her a prescription for a thigh-high stocking with a pressure gradient of 20-30. We've also discussed potentially doing water aerobics which I think is helpful for patients with venous disease. I have encouraged her to stay as active as possible and to avoid prolonged sitting and standing. If her symptoms progress think she could potentially be considered for laser ablation of the left greater saphenous vein and segmental excision of varicose veins of the left lower extremity. She certainly would be at increased risk for any procedure given her obesity. We will see her back as needed. I believe her to Xarelto is to be discontinued in September.   Angelia Mould MD [facility]

## 2013-12-02 ENCOUNTER — Ambulatory Visit (INDEPENDENT_AMBULATORY_CARE_PROVIDER_SITE_OTHER): Payer: 59 | Admitting: Family Medicine

## 2013-12-02 ENCOUNTER — Encounter: Payer: Self-pay | Admitting: Family Medicine

## 2013-12-02 VITALS — BP 113/78 | HR 69 | Temp 98.6°F | Resp 18 | Ht 64.0 in | Wt 304.0 lb

## 2013-12-02 DIAGNOSIS — I2699 Other pulmonary embolism without acute cor pulmonale: Secondary | ICD-10-CM

## 2013-12-02 DIAGNOSIS — E119 Type 2 diabetes mellitus without complications: Secondary | ICD-10-CM

## 2013-12-02 DIAGNOSIS — I10 Essential (primary) hypertension: Secondary | ICD-10-CM

## 2013-12-02 DIAGNOSIS — R0602 Shortness of breath: Secondary | ICD-10-CM

## 2013-12-02 DIAGNOSIS — I83893 Varicose veins of bilateral lower extremities with other complications: Secondary | ICD-10-CM

## 2013-12-02 DIAGNOSIS — E039 Hypothyroidism, unspecified: Secondary | ICD-10-CM

## 2013-12-02 LAB — MICROALBUMIN / CREATININE URINE RATIO
Creatinine,U: 99.9 mg/dL
MICROALB UR: 0.3 mg/dL (ref 0.0–1.9)
Microalb Creat Ratio: 0.3 mg/g (ref 0.0–30.0)

## 2013-12-02 LAB — LIPID PANEL
Cholesterol: 189 mg/dL (ref 0–200)
HDL: 72.2 mg/dL (ref >39.00)
LDL Cholesterol: 94 mg/dL (ref 0–99)
NONHDL: 116.8
Total CHOL/HDL Ratio: 3
Triglycerides: 112 mg/dL (ref 0.0–149.0)
VLDL: 22.4 mg/dL (ref 0.0–40.0)

## 2013-12-02 MED ORDER — ASPIRIN EC 81 MG PO TBEC: DELAYED_RELEASE_TABLET | ORAL | Status: DC

## 2013-12-02 MED ORDER — LISINOPRIL 40 MG PO TABS: 40.0000 mg | ORAL_TABLET | Freq: Every day | ORAL | Status: DC

## 2013-12-02 NOTE — Progress Notes (Signed)
Pre visit review using our clinic review tool, if applicable. No additional management support is needed unless otherwise documented below in the visit note. 

## 2013-12-02 NOTE — Progress Notes (Signed)
OFFICE NOTE  12/04/2013  CC:  Chief Complaint  Patient presents with  . Follow-up   HPI: Patient is a 58 y.o. Caucasian female who is here for f/u insulin resistance, HTN, hypothyroidism, hx of PE 05/2013.  She has been seeing pulm : they took her off ACE-I and put her on azor and this caused LE swelling so she stopped it. They then tried diovan and she felt SOB on this so she stopped it and went back to her lisinopril and says her breathing feels better.   She is fasting today in prep for lipid check. No hx of treatment for cholesterol in the past.   She has been on xarelto for 6 mo now, expresses strong desire to get off of anticoagulant at this time, particularly since she feels convinced that the xarelto has made her legs hurt worse chronically.     Pertinent PMH:  Past Medical History  Diagnosis Date  . Insulin resistance   . Hypertension   . Hypothyroidism   . 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 DDD (degenerative disc disease)     Dr. [name] evaluated her and recommended pain mgmt MD.  She saw Dr. Selinda Orion for pain mgmt at one time.  Marland Kitchen GERD (gastroesophageal reflux disease)   . Insomnia   . Morbid obesity     BMI 49.  Gastric stapling surgery in distant past.  . Peripheral neuropathy     No old records to confirm pt's report.  Pt refuses to have more NCS b/c test is painful.  . Fibromyalgia     questionable  . Nephrolithiasis     Lithotripsy 2002.  Right percut nephrolith 05/2013.  Marland Kitchen Disequilibrium syndrome     MRI brain normal 2012  . DIZZINESS 04/12/2010    Qualifier: Diagnosis of  By: Anitra Lauth M.D., Brien Few   . Carpal tunnel syndrome on both sides 06/21/2010  . Mixed incontinence urge and stress     and nocturia x 5-6 per night  . Microscopic hematuria 2014    CT abd pelv showed 1.8 cm right renal pelvis stone.  Also had UA c/w UTI so abx given.   . Shoulder pain     HX OF BILATERAL SHOULDER SURGERIES - PT HAS VERY LIMITED ROM -  ESPECIALLY RAISING HER ARMS  . Complication of anesthesia     STATES SHE WAS TOLD SHE WOKE UP DURING HER KNEE REPLACEMENT SURGERY AND HAD TO BE GIVEN MORE MEDICINE - NOT SURE IF SPINAL OR GENERAL ANESTHESIA  . Chronic venous insufficiency     with edema  and extensive varicose dz: Dr. Linus Mako is managing this, planning laser procedures.; A LOT OF LEG CRAMPS AND LEG STIFFNESS  . Asthma     NOT NEEDED INHALER IN OVER A YEAR  . Heart murmur     functional, w/u done; PALPITATIONS IN THE PAST  . Sleep apnea     PT DOES NOT HAVE MASK OR TUBING - JUST SLEEPS WITH HEAD ELEVATED ON 2 PILLOWS   . Pulmonary embolism 05/2013    Right sided.  Setting: post-op percutaneous nephrolithotomy--xarelto  . Superficial thrombophlebitis of left leg 05/2013    with cellulitis--resolved approp with abx and ice  . Spinal stenosis     with L5 radiculopathy, also pseudospondylolisthesis per Dr. [name]  . DVT (deep venous thrombosis)     MEDS:  Outpatient Prescriptions Prior to Visit  Medication Sig Dispense Refill  . levothyroxine (SYNTHROID) 200  MCG tablet Take 1 tablet (200 mcg total) by mouth daily before breakfast.  30 tablet  2  . LORazepam (ATIVAN) 1 MG tablet TAKE ONE TABLET BY MOUTH AT BEDTIME AS NEEDED FOR SLEEP   30 tablet  5  . metFORMIN (GLUCOPHAGE) 500 MG tablet Take 500 mg by mouth every evening.       Marland Kitchen oxyCODONE (ROXICODONE) 15 MG immediate release tablet Take 15 mg by mouth 4 (four) times daily. FOR BACK PAIN AND ARTHRITIS AND RT KNEE PAIN      . rivaroxaban (XARELTO) 20 MG TABS tablet Take 1 tablet (20 mg total) by mouth daily with supper. AFTER you finish the starter pack.  30 tablet  2  . valsartan (DIOVAN) 160 MG tablet Take 1 tablet (160 mg total) by mouth daily.  30 tablet  1  . acetaminophen (TYLENOL) 500 MG tablet Take 1,000 mg by mouth every 6 (six) hours as needed for mild pain or headache.       . furosemide (LASIX) 20 MG tablet Take 1 tablet (20 mg total) by mouth daily.  3  tablet  0   No facility-administered medications prior to visit.    PE: Blood pressure 113/78, pulse 69, temperature 98.6 F (37 C), temperature source Temporal, resp. rate 18, height 5\' 4"  (1.626 m), weight 304 lb (137.893 kg), SpO2 97.00%. Gen: Alert, well appearing.  Patient is oriented to person, place, time, and situation. CV: RRR, no m/r/g.   LUNGS: CTA bilat, nonlabored resps, good aeration in all lung fields. EXT: diffuse doughy edema but no pitting.  Tender to touch diffusely due to extensive varicose vein disease.  No erythema or rash. Right LL a bit more swollen than left: chronic.  IMPRESSION AND PLAN:  Insulin resistance Urine microalb/cr and fasting lipid panel today. Continue metformin. Last 5 HbA1c's over the last 2 years have been 6.1% or less.  ESSENTIAL HYPERTENSION Tolerates lisinopril the best out of the latest bp meds she has been on. Azor= swelling Diovan= SOB. Continue lisinopril 40mg  qd.  Varicose veins of lower extremities with other complications 0/93/81 venous duplex of LE's to check for thrombus and venous reflux showed multiple areas bilaterally of reflux, one area on left of superficial thrombophlebitis.  No DVT. She is being followed by Dr. Scot Dock in vascular surgery.  Pulmonary embolism March 2014. Right sided.  Setting: post-op percutaneous nephrolithotomy--xarelto x 6 mo now. Will d/c xarelto and she'll start ASA 162 mg qd. Signs/symptoms to call or return for were reviewed and pt expressed understanding.   Shortness of breath Multifactorial: morbid obesity with associated restrictive lung physiology, deconditioning, OHS.  Change in bp med from lisinopril to ARB not helpful per pt. She is feeling better back on lisinopril currently, so we'll continue this.  Hypothyroidism Last TSH just a touch high. She was instructed to adjust the way she took her synthroid (w/out food or other meds). Will recheck TSH at next f/u in 4 mo.   An After  Visit Summary was printed and given to the patient.  FOLLOW UP: 73mo

## 2013-12-04 ENCOUNTER — Encounter: Payer: Self-pay | Admitting: Family Medicine

## 2013-12-04 DIAGNOSIS — E039 Hypothyroidism, unspecified: Secondary | ICD-10-CM | POA: Insufficient documentation

## 2013-12-04 NOTE — Assessment & Plan Note (Signed)
Last TSH just a touch high. She was instructed to adjust the way she took her synthroid (w/out food or other meds). Will recheck TSH at next f/u in 4 mo.

## 2013-12-04 NOTE — Assessment & Plan Note (Signed)
11/17/13 venous duplex of LE's to check for thrombus and venous reflux showed multiple areas bilaterally of reflux, one area on left of superficial thrombophlebitis.  No DVT. She is being followed by Dr. Scot Dock in vascular surgery.

## 2013-12-04 NOTE — Assessment & Plan Note (Signed)
Multifactorial: morbid obesity with associated restrictive lung physiology, deconditioning, OHS.  Change in bp med from lisinopril to ARB not helpful per pt. She is feeling better back on lisinopril currently, so we'll continue this.

## 2013-12-04 NOTE — Assessment & Plan Note (Addendum)
Urine microalb/cr and fasting lipid panel today. Continue metformin. Last 5 HbA1c's over the last 2 years have been 6.1% or less.

## 2013-12-04 NOTE — Assessment & Plan Note (Signed)
March 2014. Right sided.  Setting: post-op percutaneous nephrolithotomy--xarelto x 6 mo now. Will d/c xarelto and she'll start ASA 162 mg qd. Signs/symptoms to call or return for were reviewed and pt expressed understanding.

## 2013-12-04 NOTE — Assessment & Plan Note (Signed)
Tolerates lisinopril the best out of the latest bp meds she has been on. Azor= swelling Diovan= SOB. Continue lisinopril 40mg  qd.

## 2013-12-09 ENCOUNTER — Other Ambulatory Visit: Payer: Self-pay | Admitting: Internal Medicine

## 2013-12-09 DIAGNOSIS — R0602 Shortness of breath: Secondary | ICD-10-CM

## 2013-12-12 ENCOUNTER — Encounter: Payer: Self-pay | Admitting: Internal Medicine

## 2013-12-12 ENCOUNTER — Ambulatory Visit (INDEPENDENT_AMBULATORY_CARE_PROVIDER_SITE_OTHER): Payer: 59 | Admitting: Internal Medicine

## 2013-12-12 VITALS — BP 112/78 | HR 64 | Temp 98.0°F | Ht 62.0 in | Wt 302.0 lb

## 2013-12-12 DIAGNOSIS — G4733 Obstructive sleep apnea (adult) (pediatric): Secondary | ICD-10-CM

## 2013-12-12 DIAGNOSIS — I10 Essential (primary) hypertension: Secondary | ICD-10-CM

## 2013-12-12 DIAGNOSIS — I2699 Other pulmonary embolism without acute cor pulmonale: Secondary | ICD-10-CM

## 2013-12-12 DIAGNOSIS — R0602 Shortness of breath: Secondary | ICD-10-CM

## 2013-12-12 LAB — PULMONARY FUNCTION TEST
DL/VA % pred: 140 %
DL/VA: 6.36 ml/min/mmHg/L
DLCO UNC % PRED: 111 %
DLCO unc: 23.99 ml/min/mmHg
FEF 25-75 POST: 2.06 L/s
FEF 25-75 Pre: 1.96 L/sec
FEF2575-%Change-Post: 4 %
FEF2575-%PRED-POST: 90 %
FEF2575-%Pred-Pre: 86 %
FEV1-%Change-Post: 0 %
FEV1-%PRED-POST: 81 %
FEV1-%PRED-PRE: 81 %
FEV1-POST: 1.95 L
FEV1-PRE: 1.94 L
FEV1FVC-%Change-Post: 4 %
FEV1FVC-%PRED-PRE: 105 %
FEV6-%CHANGE-POST: -3 %
FEV6-%Pred-Post: 76 %
FEV6-%Pred-Pre: 79 %
FEV6-Post: 2.28 L
FEV6-Pre: 2.37 L
FEV6FVC-%PRED-POST: 103 %
FEV6FVC-%Pred-Pre: 103 %
FVC-%Change-Post: -3 %
FVC-%PRED-POST: 74 %
FVC-%Pred-Pre: 76 %
FVC-PRE: 2.37 L
FVC-Post: 2.28 L
POST FEV1/FVC RATIO: 85 %
PRE FEV1/FVC RATIO: 82 %
PRE FEV6/FVC RATIO: 100 %
Post FEV6/FVC ratio: 100 %
RV % pred: 117 %
RV: 2.2 L
TLC % pred: 101 %
TLC: 4.84 L

## 2013-12-12 NOTE — Progress Notes (Signed)
Subjective:    Patient ID: Tricia Perry, female    DOB: 27-Jul-1955  MRN: [id number]     Brief patient profile:  77 yowf  03/1998 at [facility] with no resp problems with dx of asthma in mid 90's  transiently used inhalers but stopped them s flare and then March 2015 preop for kidney stones breathing worse and required treatments p surgery seemed better with treatments in hospital then worse the next day ever since then referrd to pulmonary clinic 10/07/13 on ACEi but declined to stay off them with unexplained ongoing sob.    History of Present Illness  10/07/2013 1st [facility] office visit/ Dior Stepter  Chief Complaint  Patient presents with  . Pulmonary Consult    Referred per Dr. Anitra Lauth.  Pt c/o SOB for the past 6 months- gets SOB with exertion and at rest.  She also c/o "wheezing cough"- non prod and mainly in the late evenings.   if sob at rest, gets up and walks around and feels better. Sleeps fine p sleeping pill  Assoc with dry cough and "other people hear me wheezing" Onset was indolent minimally progressive until abruptly worse p ET,  Pattern continues to be variable but no obvious   >>stop Lisinopril , rx AZOR   11/10/2013 Follow up  Pt returns for 1 month follow up for Cough and Dyspnea.  Seen last ov with shortness of breath for >17months along with cough.  Felt started after surgery in 05/2013 .  Hx of PE , on Xarelto. 05/2013.  Last ov , ACEi stopped d/t cough and upper airway wheezing.  Placed on AZOR samples , however noticed legs more swollen . Has hx of chronic leg edema and venous insufficiency.  Did not notice change in her breathing or cough since last ov.  Says she has hx of OSA in past but did not wear CPAP .  Does feel tired in am and snores. Declines sleep study, agrees to ONO .  Today in office no desats with walking.  Spirometry last ov showed mild restrictive lung disease w/ no airflow obstruction.    12/12/2013 f/u ov/Savan Ruta re: unexplained sob / back on ACEi    Chief Complaint  Patient presents with  . Followup with PFT    Pt states that her breathing is unchanged since last visit.  No new co's today.   walked at [employer] 2 weeks prior to OV  ? Worse since then in terms of resting paroxysms of sob and noct events per husband while back on ACEi ? When restarted.   No obvious day to day or daytime variabilty or assoc chronic cough or cp or chest tightness, subjective wheeze overt sinus or hb symptoms. No unusual exp hx or h/o childhood pna/ asthma or knowledge of premature birth.  Sleeping ok without nocturnal  or early am exacerbation  of respiratory  c/o's or need for noct saba. Also denies any obvious fluctuation of symptoms with weather or environmental changes or other aggravating or alleviating factors except as outlined above   Current Medications, Allergies, Complete Past Medical History, Past Surgical History, Family History, and Social History were reviewed in [employer] record.  ROS  The following are not active complaints unless bolded sore throat, dysphagia, dental problems, itching, sneezing,  nasal congestion or excess/ purulent secretions, ear ache,   fever, chills, sweats, unintended wt loss, pleuritic or exertional cp, hemoptysis,  orthopnea pnd or leg swelling, presyncope, palpitations, heartburn, abdominal pain, anorexia, nausea,  vomiting, diarrhea  or change in bowel or urinary habits, change in stools or urine, dysuria,hematuria,  rash, arthralgias, visual complaints, headache, numbness weakness or ataxia or problems with walking or coordination,  change in mood/affect or memory.                        Objective:   Physical Exam        Obese amb wf not able to get on table even with assistance due to "bad knees" Extremely evasive with responses to questions re symptoms    Wt Readings from Last 3 Encounters:  12/12/13 302 lb (136.986 kg)  12/02/13 304 lb (137.893 kg)  11/16/13 305 lb (138.347  kg)      HEENT: nl dentition, turbinates, and orophanx. Nl external ear canals without cough reflex   NECK :  without JVD/Nodes/TM/ nl carotid upstrokes bilaterally   LUNGS: no acc muscle use, clear to A and P bilaterally without cough on insp or exp maneuvers   CV:  RRR  no s3 or murmur or increase in P2, 1+ edema , venous insufficiency changes noted.   ABD:  soft and nontender with nl excursion in the supine position. No bruits or organomegaly, bowel sounds nl  MS:  warm without deformities, calf tenderness, cyanosis or clubbing  SKIN: warm and dry without lesions    NEURO:  alert, approp, no deficits     cxr 07/14/13 Mild right basilar atelectasis   Lab Results  Component Value Date   WBC 6.0 11/10/2013   HGB 12.8 11/10/2013   HCT 38.9 11/10/2013   MCV 90.7 11/10/2013   PLT 297.0 11/10/2013       Chemistry      Component Value Date/Time   NA 137 11/10/2013 1612   K 4.1 11/10/2013 1612   CL 100 11/10/2013 1612   CO2 30 11/10/2013 1612   BUN 19 11/10/2013 1612   CREATININE 0.6 11/10/2013 1612   CREATININE 0.62 05/31/2012 1524      Component Value Date/Time   CALCIUM 9.8 11/10/2013 1612   ALKPHOS 142* 06/11/2013 0344   AST 30 06/11/2013 0344   ALT 40* 06/11/2013 0344   BILITOT 0.2* 06/11/2013 0344       Lab Results  Component Value Date   TSH 5.76* 09/21/2013     Lab Results  Component Value Date   PROBNP 37.0 11/10/2013     Lab Results  Component Value Date   ESRSEDRATE 5 05/31/2012         Assessment & Plan:

## 2013-12-12 NOTE — Progress Notes (Signed)
PFT done today. 

## 2013-12-12 NOTE — Patient Instructions (Addendum)
The most likely cause for your breathing disorder is your blood pressure medication and your weight but you do not appear to have a lung problem   Please see patient coordinator before you leave today  to schedule echocardiogram to recheck your heart.   Make sure to follow up your primary care doctor

## 2013-12-13 DIAGNOSIS — G4733 Obstructive sleep apnea (adult) (pediatric): Secondary | ICD-10-CM | POA: Insufficient documentation

## 2013-12-13 NOTE — Assessment & Plan Note (Signed)
See CTa 06/01/13 RML only -  Setting: post-op percutaneous nephrolithotomy--xarelto rx 05/2013 ; 12/02/2013  - Venous dopplers neg 06/08/13 bilaterally - Rec repeat echo at ov 12/12/13   Comment: extremely unlikely she has TEPAH or recurrent emboli clinically but need echo to see if any PAH related to mild nocturnal desat (see osa) and if so consider trial of noct 02 or revisit cpap

## 2013-12-13 NOTE — Assessment & Plan Note (Addendum)
Spriometry  10/07/13   FEV1  1.67  ( 66% )  Ratio 72  Trial off acei 10/08/2013 > back on it at [facility] 12/12/13 not clear when restarted it  11/10/2013 >no ambulatory desats -ONO  RA 11/28/13  7 m 16sec sats < 89%  - 12/12/2013 PFTs wnl  - 12/12/2013  Walked RA @ slow pace x 2 laps @ 185 ft each stopped due to  Sob with sats up to 98% at end of study  - PFTs 12/12/13 wnl including dlco  Her sob at rest not worse with exercise is not typical at all of a heart or lung problem and is likely upper airway in nature with ddx ACEi, Acid reflux and Anxiety the leading suspects which have not been eliminated.    Nothing further to add to her care at this welcome, welcome second opinion at one of the medical centers if Dr Anitra Lauth or pt desire.

## 2013-12-13 NOTE — Assessment & Plan Note (Signed)
Remote dx/ non adherent with cpap -ONO  RA 11/28/13  7 m 16sec sats < 89%  In the absence of PAH this is not enough desat to warrant 02 and pt has cpap but won't wear it so may indication for rx is daytime drowsiness or refractory hbp > if so rec eval by sleep doc but defer that decision to primary care as not immediate concern but rather longstanding issue with obesity that she hasn't addressed either - would make sure TSH has been normalized before further w/u

## 2013-12-13 NOTE — Assessment & Plan Note (Signed)
Lisinopril controls bp but she has unexplained episodes of resting / sleeping sob that are c/w acei effects on upper airway (the equivalent of an acei cough which is absent here but doesn't prove anything as we have had dozens of pts with upper airway syndromes resolved off acei if continue for up to 3 m off.    Will defer final choice rx to Dr Genelle Gather but no need to return to pulmonary clinic unless off acei a minimum of 6 weeks continuously

## 2013-12-16 ENCOUNTER — Ambulatory Visit (HOSPITAL_COMMUNITY): Payer: 59 | Attending: Internal Medicine | Admitting: Radiology

## 2013-12-16 DIAGNOSIS — I2699 Other pulmonary embolism without acute cor pulmonale: Secondary | ICD-10-CM | POA: Diagnosis not present

## 2013-12-16 DIAGNOSIS — R0602 Shortness of breath: Secondary | ICD-10-CM | POA: Diagnosis not present

## 2013-12-16 DIAGNOSIS — I10 Essential (primary) hypertension: Secondary | ICD-10-CM | POA: Insufficient documentation

## 2013-12-16 HISTORY — PX: TRANSTHORACIC ECHOCARDIOGRAM: SHX275

## 2013-12-16 NOTE — Progress Notes (Signed)
Echocardiogram performed.  

## 2013-12-19 ENCOUNTER — Ambulatory Visit (HOSPITAL_BASED_OUTPATIENT_CLINIC_OR_DEPARTMENT_OTHER)
Admission: RE | Admit: 2013-12-19 | Discharge: 2013-12-19 | Disposition: A | Discharge status: Home / Self Care | Payer: 59 | Source: Ambulatory Visit | Attending: Family Medicine | Admitting: Family Medicine

## 2013-12-19 ENCOUNTER — Ambulatory Visit (INDEPENDENT_AMBULATORY_CARE_PROVIDER_SITE_OTHER): Payer: 59 | Admitting: Family Medicine

## 2013-12-19 ENCOUNTER — Encounter: Payer: Self-pay | Admitting: Family Medicine

## 2013-12-19 VITALS — BP 114/81 | HR 78 | Temp 98.0°F | Resp 18 | Ht 64.0 in | Wt 302.0 lb

## 2013-12-19 DIAGNOSIS — M79609 Pain in unspecified limb: Secondary | ICD-10-CM | POA: Insufficient documentation

## 2013-12-19 DIAGNOSIS — R0602 Shortness of breath: Secondary | ICD-10-CM | POA: Diagnosis not present

## 2013-12-19 DIAGNOSIS — I83893 Varicose veins of bilateral lower extremities with other complications: Secondary | ICD-10-CM | POA: Diagnosis not present

## 2013-12-19 DIAGNOSIS — I8000 Phlebitis and thrombophlebitis of superficial vessels of unspecified lower extremity: Secondary | ICD-10-CM

## 2013-12-19 DIAGNOSIS — M7989 Other specified soft tissue disorders: Secondary | ICD-10-CM | POA: Diagnosis present

## 2013-12-19 DIAGNOSIS — I10 Essential (primary) hypertension: Secondary | ICD-10-CM

## 2013-12-19 DIAGNOSIS — Z86711 Personal history of pulmonary embolism: Secondary | ICD-10-CM | POA: Diagnosis not present

## 2013-12-19 DIAGNOSIS — I8002 Phlebitis and thrombophlebitis of superficial vessels of left lower extremity: Secondary | ICD-10-CM

## 2013-12-19 DIAGNOSIS — E039 Hypothyroidism, unspecified: Secondary | ICD-10-CM

## 2013-12-19 LAB — BASIC METABOLIC PANEL
BUN: 21 mg/dL (ref 6–23)
CHLORIDE: 101 meq/L (ref 96–112)
CO2: 30 meq/L (ref 19–32)
Calcium: 9.9 mg/dL (ref 8.4–10.5)
Creat: 0.7 mg/dL (ref 0.50–1.10)
Glucose, Bld: 97 mg/dL (ref 70–99)
Potassium: 4.6 mEq/L (ref 3.5–5.3)
Sodium: 137 mEq/L (ref 135–145)

## 2013-12-19 MED ORDER — IOHEXOL 350 MG/ML SOLN
100.0000 mL | Freq: Once | INTRAVENOUS | Status: AC | PRN
  Administered 2013-12-19: 100 mL via INTRAVENOUS

## 2013-12-19 MED ORDER — METOPROLOL TARTRATE 25 MG PO TABS: 25.0000 mg | ORAL_TABLET | Freq: Two times a day (BID) | ORAL | Status: DC

## 2013-12-19 NOTE — Progress Notes (Signed)
Pre visit review using our clinic review tool, if applicable. No additional management support is needed unless otherwise documented below in the visit note. 

## 2013-12-19 NOTE — Progress Notes (Addendum)
OFFICE VISIT  12/19/2013   CC:  Chief Complaint  Patient presents with  . Shortness of Breath  . Leg Pain    L leg   HPI:    Patient is a 58 y.o. Caucasian female who presents for ongoing "constant SOB".  Also, has had onset of redness, swelling, and pain in proximal region of right anterolateral LL a couple of days ago.  Gets hot/flushed on and off, diffusely weak and worse SOB last few days.  NOT SOB today as bad as the prev 2 days.  +Anxious, esp since we stopped her oral anticoagulant last visit at her strong preference/request.  Of note, Dr. Melvyn Novas, pulmonologist, recommends strongly that she still be considered to have dx of ACE-I induced breathing/upper airway issues and needs a 3 mo period OFF of this med before saying she doesn't have this.  She is agreeable to trying a med in place of her lisinopril at this time.  Past Medical History  Diagnosis Date  . Insulin resistance   . Hypertension   . Hypothyroidism   . 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 DDD (degenerative disc disease)     Dr. [name] evaluated her and recommended pain mgmt MD.  She saw Dr. Selinda Orion for pain mgmt at one time.  Marland Kitchen GERD (gastroesophageal reflux disease)   . Insomnia   . Morbid obesity     BMI 49.  Gastric stapling surgery in distant past.  . Peripheral neuropathy     No old records to confirm pt's report.  Pt refuses to have more NCS b/c test is painful.  . Fibromyalgia     questionable  . Nephrolithiasis     Lithotripsy 2002.  Right percut nephrolith 05/2013.  Marland Kitchen Disequilibrium syndrome     MRI brain normal 2012  . DIZZINESS 04/12/2010    Qualifier: Diagnosis of  By: Anitra Lauth M.D., Brien Few   . Carpal tunnel syndrome on both sides 06/21/2010  . Mixed incontinence urge and stress     and nocturia x 5-6 per night  . Microscopic hematuria 2014    CT abd pelv showed 1.8 cm right renal pelvis stone.  Also had UA c/w UTI so abx given.   . Shoulder pain     HX OF BILATERAL  SHOULDER SURGERIES - PT HAS VERY LIMITED ROM - ESPECIALLY RAISING HER ARMS  . Complication of anesthesia     STATES SHE WAS TOLD SHE WOKE UP DURING HER KNEE REPLACEMENT SURGERY AND HAD TO BE GIVEN MORE MEDICINE - NOT SURE IF SPINAL OR GENERAL ANESTHESIA  . Chronic venous insufficiency     with edema  and extensive varicose dz: Dr. Linus Mako is managing this, planning laser procedures.; A LOT OF LEG CRAMPS AND LEG STIFFNESS  . Asthma     NOT NEEDED INHALER IN OVER A YEAR  . Heart murmur     functional, w/u done; PALPITATIONS IN THE PAST  . Sleep apnea     PT DOES NOT HAVE MASK OR TUBING - JUST SLEEPS WITH HEAD ELEVATED ON 2 PILLOWS   . Pulmonary embolism 05/2013    Right sided.  Setting: post-op percutaneous nephrolithotomy--xarelto x 6 mo  . Superficial thrombophlebitis of left leg 05/2013    with cellulitis--resolved approp with abx and ice  . Spinal stenosis     with L5 radiculopathy, also pseudospondylolisthesis per Dr. [name]    Past Surgical History  Procedure Laterality Date  . Cholecystectomy  1987  .  Joint replacement  2009    Right Knee, [facility]  . Foot surgery  2000    Left tarsel tunnel release  . Hernia repair   1980's    Diaphragmatic + gastric stapling  . Hernia repair      Inguinal  . Extracorporeal shock wave lithotripsy    . Endometrial ablation      menorrhagia  . Bilateral shoulder surgery    . Nephrolithotomy Right 05/31/2013    Procedure: NEPHROLITHOTOMY PERCUTANEOUS;  Surgeon: Irine Seal, MD;  Location: [facility];  Service: Urology;  Laterality: Right;  . Transthoracic echocardiogram  12/16/13    EF 00-34%, grade 2 diastolic dysfxn, PA pressure 37 (mildly high)    Outpatient Prescriptions Prior to Visit  Medication Sig Dispense Refill  . acetaminophen (TYLENOL) 500 MG tablet Take 1,000 mg by mouth every 6 (six) hours as needed for mild pain or headache.       Marland Kitchen aspirin EC 81 MG tablet Take 81 mg by mouth daily.      Marland Kitchen levothyroxine (SYNTHROID) 200  MCG tablet Take 1 tablet (200 mcg total) by mouth daily before breakfast.  30 tablet  2  . LORazepam (ATIVAN) 1 MG tablet TAKE ONE TABLET BY MOUTH AT BEDTIME AS NEEDED FOR SLEEP   30 tablet  5  . metFORMIN (GLUCOPHAGE) 500 MG tablet Take 500 mg by mouth every evening.       Marland Kitchen oxyCODONE (ROXICODONE) 15 MG immediate release tablet Take 15 mg by mouth 4 (four) times daily. FOR BACK PAIN AND ARTHRITIS AND RT KNEE PAIN      . lisinopril (PRINIVIL,ZESTRIL) 40 MG tablet Take 1 tablet (40 mg total) by mouth daily.  30 tablet  11   No facility-administered medications prior to visit.    Allergies  Allergen Reactions  . Codeine Itching    Back hurts  . Mold Extract [Trichophyton] Nausea And Vomiting and Other (See Comments)    Sneezing real bad   . Morphine Itching    back hurts    ROS As per HPI  PE: Blood pressure 114/81, pulse 78, temperature 98 F (36.7 C), temperature source Temporal, resp. rate 18, height 5\' 4"  (1.626 m), weight 302 lb (136.986 kg), SpO2 100.00%. JZP:HXTA: no injection, icteris, swelling, or exudate.  EOMI, PERRLA. Mouth: lips without lesion/swelling.  Oral mucosa pink and moist. Oropharynx without erythema, exudate, or swelling.  CV: RRR, no m/r/g.   LUNGS: CTA bilat, nonlabored resps, good aeration in all lung fields. Left LL anteromedial region with 3 and 1/2 inch diameter round erythematous and indurated area of skin with dilated superficial veins present.  No extension upward, no streaking.  No palpable mass.  LABS:    Chemistry      Component Value Date/Time   NA 137 11/10/2013 1612   K 4.1 11/10/2013 1612   CL 100 11/10/2013 1612   CO2 30 11/10/2013 1612   BUN 19 11/10/2013 1612   CREATININE 0.6 11/10/2013 1612   CREATININE 0.62 05/31/2012 1524      Component Value Date/Time   CALCIUM 9.8 11/10/2013 1612   ALKPHOS 142* 06/11/2013 0344   AST 30 06/11/2013 0344   ALT 40* 06/11/2013 0344   BILITOT 0.2* 06/11/2013 0344     Lab Results  Component Value Date    TSH 5.76* 09/21/2013    IMPRESSION AND PLAN:  1) Chronic SOB: multifactorial (Obesity hypoventilation syndrome, OSA untreated, question of ACE-I induced upper airway dysfunction, anxiety).  Acutely worsened in connection  with L LE pain, recently got of oral anticoag for hx of post-procedural PE 7 mo ago. Need to check CT angio chest today, will obtain stat Creatinine first. She'll stop her lisinopril.  2) Left Leg superficial thrombophlebitis. Ice, elevation.  Needs to get the thigh-high compression hose that vascular MD rx'd but she says she cannot afford them at this time.   Will do left LL venous doppler today.  3) HTN: well controlled on ACE-I but we're stopping this due to possible effects this is having on her upper airway.  Change to lopressor 25mg  bid.  4) Hypothyroidism: last TSH slightly high.  Recheck TSH today.  An After Visit Summary was printed and given to the patient.  FOLLOW UP: Return for keep appt for 04/06/14.  ADDENDUM 12/19/13, 5:45 PM:  BMET normal, CT angio chest NORMAL, Left LL venous u/s showed multiple thrombosed varicose veins but no DVT.   No change in plans.  I talked with pt about results/plan when she was still in radiology dept today. Signs/symptoms to call or return for were reviewed and pt expressed understanding. --PM

## 2013-12-20 ENCOUNTER — Encounter: Payer: Self-pay | Admitting: Internal Medicine

## 2013-12-20 LAB — TSH: TSH: 1.334 u[IU]/mL (ref 0.350–4.500)

## 2013-12-21 NOTE — Progress Notes (Signed)
Quick Note:  LMTCB ______ 

## 2013-12-22 ENCOUNTER — Telehealth: Payer: Self-pay | Admitting: Internal Medicine

## 2013-12-22 NOTE — Telephone Encounter (Signed)
Result Note     Call patient : Study is c/w significant hypertension effects on heart but that is all - send copy to Dr Anitra Lauth, her primary, no change rx needed other that what I previously rec at ov  ---   I spoke with patient about results and she verbalized understanding and had no questions

## 2013-12-22 NOTE — Telephone Encounter (Signed)
Patient calling again for results.

## 2013-12-23 ENCOUNTER — Other Ambulatory Visit: Payer: Self-pay | Admitting: Family Medicine

## 2013-12-23 ENCOUNTER — Telehealth: Payer: Self-pay | Admitting: Family Medicine

## 2013-12-23 MED ORDER — LEVOTHYROXINE SODIUM 200 MCG PO TABS: 200.0000 ug | ORAL_TABLET | Freq: Every day | ORAL | Status: DC

## 2013-12-23 NOTE — Telephone Encounter (Signed)
Left detailed message on machine for pt.

## 2013-12-23 NOTE — Telephone Encounter (Signed)
Reassure pt that I already knew about the result (it was her echocardiogram result) and she does NOT need to change anything with her meds at this time.  Continue everything exactly the way she has it.-thx

## 2013-12-23 NOTE — Telephone Encounter (Signed)
Please advise 

## 2013-12-23 NOTE — Telephone Encounter (Signed)
Patient got a call from Dr. Gustavus Bryant nurse saying that her test results showed an abnormality (pt wasn't sure what the abnormality was)  that she was going to send to Dr. Anitra Lauth for review. Patient would like to know what Dr. Anitra Lauth thinks, does she need to change her meds.

## 2014-01-02 ENCOUNTER — Encounter: Payer: Self-pay | Admitting: Family Medicine

## 2014-01-02 ENCOUNTER — Telehealth: Payer: Self-pay | Admitting: Family Medicine

## 2014-01-02 DIAGNOSIS — R0602 Shortness of breath: Secondary | ICD-10-CM

## 2014-01-02 NOTE — Telephone Encounter (Signed)
Patient Tricia Perry requesting approval to get injection in her back by Dr. Jacelyn Grip, his office won't give her the injection unless you say okay.   Also, patient wants to know if she needs to go to [facility] for SOB.  She states it is getting worse.  Please advise.

## 2014-01-02 NOTE — Telephone Encounter (Signed)
Letter printed to send so she can get the injection. I DO think it would be best for her to follow up with her pulmonologist about her shortness of breath.-thx

## 2014-01-03 ENCOUNTER — Encounter: Payer: Self-pay | Admitting: Family Medicine

## 2014-01-03 NOTE — Telephone Encounter (Signed)
Patient would like to go to a different pulm MD for second opinion.

## 2014-01-03 NOTE — Telephone Encounter (Signed)
Referral to Dr. Verdie Mosher at [facility] [place] ordered.

## 2014-01-05 ENCOUNTER — Encounter: Payer: Self-pay | Admitting: Internal Medicine

## 2014-02-07 ENCOUNTER — Other Ambulatory Visit: Payer: Self-pay | Admitting: Family Medicine

## 2014-02-07 NOTE — Telephone Encounter (Signed)
Refill request for lorazepam Last filled by MD on- 08/09/13 #30 x5 Last Appt: 12/19/2013 Next Appt: 04/06/2013 Please advise refill?

## 2014-02-08 ENCOUNTER — Other Ambulatory Visit: Payer: Self-pay

## 2014-04-06 ENCOUNTER — Ambulatory Visit: Payer: 59 | Admitting: Family Medicine

## 2014-04-13 ENCOUNTER — Encounter (HOSPITAL_COMMUNITY): Payer: Self-pay | Admitting: Urology

## 2014-04-19 ENCOUNTER — Other Ambulatory Visit: Payer: Self-pay | Admitting: Family Medicine

## 2014-05-18 ENCOUNTER — Ambulatory Visit (HOSPITAL_BASED_OUTPATIENT_CLINIC_OR_DEPARTMENT_OTHER)
Admission: RE | Admit: 2014-05-18 | Discharge: 2014-05-18 | Disposition: A | Discharge status: Home / Self Care | Payer: No Typology Code available for payment source | Source: Ambulatory Visit | Attending: Family Medicine | Admitting: Family Medicine

## 2014-05-18 ENCOUNTER — Encounter: Payer: Self-pay | Admitting: Family Medicine

## 2014-05-18 ENCOUNTER — Ambulatory Visit (INDEPENDENT_AMBULATORY_CARE_PROVIDER_SITE_OTHER): Payer: No Typology Code available for payment source | Admitting: Family Medicine

## 2014-05-18 ENCOUNTER — Ambulatory Visit: Payer: 59 | Admitting: Family Medicine

## 2014-05-18 VITALS — BP 148/101 | HR 70 | Temp 98.0°F | Resp 18 | Ht 64.0 in | Wt 307.0 lb

## 2014-05-18 DIAGNOSIS — Z96651 Presence of right artificial knee joint: Secondary | ICD-10-CM

## 2014-05-18 DIAGNOSIS — E8881 Metabolic syndrome: Secondary | ICD-10-CM

## 2014-05-18 DIAGNOSIS — M1288 Other specific arthropathies, not elsewhere classified, other specified site: Secondary | ICD-10-CM | POA: Insufficient documentation

## 2014-05-18 DIAGNOSIS — M1611 Unilateral primary osteoarthritis, right hip: Secondary | ICD-10-CM | POA: Diagnosis not present

## 2014-05-18 DIAGNOSIS — M25551 Pain in right hip: Secondary | ICD-10-CM | POA: Diagnosis present

## 2014-05-18 DIAGNOSIS — M25561 Pain in right knee: Secondary | ICD-10-CM

## 2014-05-18 DIAGNOSIS — G8929 Other chronic pain: Secondary | ICD-10-CM

## 2014-05-18 DIAGNOSIS — Z23 Encounter for immunization: Secondary | ICD-10-CM

## 2014-05-18 DIAGNOSIS — R5382 Chronic fatigue, unspecified: Secondary | ICD-10-CM

## 2014-05-18 LAB — COMPREHENSIVE METABOLIC PANEL
ALT: 21 U/L (ref 0–35)
AST: 19 U/L (ref 0–37)
Albumin: 4.2 g/dL (ref 3.5–5.2)
Alkaline Phosphatase: 116 U/L (ref 39–117)
BUN: 16 mg/dL (ref 6–23)
CHLORIDE: 101 meq/L (ref 96–112)
CO2: 33 meq/L — AB (ref 19–32)
Calcium: 9.6 mg/dL (ref 8.4–10.5)
Creatinine, Ser: 0.65 mg/dL (ref 0.40–1.20)
GFR: 98.91 mL/min (ref >60.00)
Glucose, Bld: 95 mg/dL (ref 70–99)
Potassium: 4.4 mEq/L (ref 3.5–5.1)
SODIUM: 137 meq/L (ref 135–145)
TOTAL PROTEIN: 6.8 g/dL (ref 6.0–8.3)
Total Bilirubin: 0.4 mg/dL (ref 0.2–1.2)

## 2014-05-18 LAB — CBC WITH DIFFERENTIAL/PLATELET
BASOS ABS: 0 10*3/uL (ref 0.0–0.1)
Basophils Relative: 0.6 % (ref 0.0–3.0)
EOS PCT: 3.7 % (ref 0.0–5.0)
Eosinophils Absolute: 0.2 10*3/uL (ref 0.0–0.7)
HEMATOCRIT: 40.5 % (ref 36.0–46.0)
Hemoglobin: 13.5 g/dL (ref 12.0–15.0)
Lymphocytes Relative: 27 % (ref 12.0–46.0)
Lymphs Abs: 1.8 10*3/uL (ref 0.7–4.0)
MCHC: 33.3 g/dL (ref 30.0–36.0)
MCV: 88.5 fl (ref 78.0–100.0)
MONO ABS: 0.4 10*3/uL (ref 0.1–1.0)
Monocytes Relative: 6.5 % (ref 3.0–12.0)
Neutro Abs: 4.1 10*3/uL (ref 1.4–7.7)
Neutrophils Relative %: 62.2 % (ref 43.0–77.0)
PLATELETS: 274 10*3/uL (ref 150.0–400.0)
RBC: 4.57 Mil/uL (ref 3.87–5.11)
RDW: 14.2 % (ref 11.5–15.5)
WBC: 6.6 10*3/uL (ref 4.0–10.5)

## 2014-05-18 LAB — HEMOGLOBIN A1C: HEMOGLOBIN A1C: 6.2 % (ref 4.6–6.5)

## 2014-05-18 MED ORDER — DILTIAZEM HCL ER COATED BEADS 180 MG PO CP24: 180.0000 mg | ORAL_CAPSULE | Freq: Every day | ORAL | Status: DC

## 2014-05-18 MED ORDER — PREDNISONE 20 MG PO TABS: ORAL_TABLET | ORAL | Status: DC

## 2014-05-18 NOTE — Progress Notes (Signed)
OFFICE NOTE  05/21/2014  CC:  Chief Complaint  Patient presents with  . Fatigue    maybe new BP meds?  . Hip Pain    Right hip radiates down leg mainly at night    HPI: Patient is a 59 y.o. Caucasian female who is here with her husband today for about 1 yr of right hip pain, hurts worse at night.  No trauma/injury recalled. Worse at night, only mild during daytime, hurts worse when lying on it.  Points to back of right glut area and lateral aspect of hip--says it sometimes goes up and down right leg.  Difficult historian, sometimes just says "I don't know, it just hurts, that's all I know" when asked specifics about her complaints. Heat/ice packs helps some.  Feels like a throbbing, toothache-like pain. Had L spine injection around 02/2014 by Dr. Jacelyn Grip and she can't tell much difference in pain.  Question of leg pains consistently after starting metoprolol, energy level went down as well (although this is a complaint she has EVERY VISIT).  Says right knee hurts chronically and swells on and off.  No redness or warmth per pt's recollection.  No other joint swelling, no rash.  No unexplained fevers, no oral ulcers, no vision or hearing problems, no dizziness/vertigo lately, no HA's.   Pertinent PMH:  Past medical, surgical, social, and family history reviewed and no changes are noted since last office visit.  MEDS:  Outpatient Prescriptions Prior to Visit  Medication Sig Dispense Refill  . acetaminophen (TYLENOL) 500 MG tablet Take 1,000 mg by mouth every 6 (six) hours as needed for mild pain or headache.     Marland Kitchen aspirin EC 81 MG tablet Take 81 mg by mouth daily.    Marland Kitchen levothyroxine (SYNTHROID, LEVOTHROID) 200 MCG tablet TAKE ONE TABLET BY MOUTH ONE TIME DAILY before breakfast 30 tablet 2  . LORazepam (ATIVAN) 1 MG tablet TAKE ONE TABLET BY MOUTH AT BEDTIME AS NEEDED FOR SLEEP  30 tablet 5  . metFORMIN (GLUCOPHAGE) 500 MG tablet Take 500 mg by mouth every evening.     Marland Kitchen oxyCODONE  (ROXICODONE) 15 MG immediate release tablet Take 15 mg by mouth 4 (four) times daily. FOR BACK PAIN AND ARTHRITIS AND RT KNEE PAIN    . metoprolol tartrate (LOPRESSOR) 25 MG tablet TAKE ONE TABLET BY MOUTH TWICE DAILY  60 tablet 2   No facility-administered medications prior to visit.    PE: Blood pressure 148/101, pulse 70, temperature 98 F (36.7 C), temperature source Temporal, resp. rate 18, height 5\' 4"  (1.626 m), weight 307 lb (139.254 kg), SpO2 97 %. CV: RRR, no m/r/g.   LUNGS: CTA bilat, nonlabored resps, good aeration in all lung fields. Right hip with TTP over greater troch region.  Minimal discomfort with right him Ext/flex and ER/IR passively.  No tenderness of ischial tuberosity.    Lab Results  Component Value Date   TSH 1.334 12/19/2013   Lab Results  Component Value Date   HGBA1C 6.2 05/18/2014     Chemistry      Component Value Date/Time   NA 137 05/18/2014 1448   K 4.4 05/18/2014 1448   CL 101 05/18/2014 1448   CO2 33* 05/18/2014 1448   BUN 16 05/18/2014 1448   CREATININE 0.65 05/18/2014 1448   CREATININE 0.70 12/19/2013 1343      Component Value Date/Time   CALCIUM 9.6 05/18/2014 1448   ALKPHOS 116 05/18/2014 1448   AST 19 05/18/2014 1448  ALT 21 05/18/2014 1448   BILITOT 0.4 05/18/2014 1448     Lab Results  Component Value Date   WBC 6.6 05/18/2014   HGB 13.5 05/18/2014   HCT 40.5 05/18/2014   MCV 88.5 05/18/2014   PLT 274.0 05/18/2014     IMPRESSION AND PLAN:  1) Right hip pain; suspect trochanteric bursitis.  Question of OA contributing given her history of OA in knees and back. Check hip x-ray.   Prednisone 40 mg qd x 5d, then 20mg  qd x 5d. If this is not helpful then she'll have to get in with her orthopedist, Dr. [name], for consideration of bursa steroid injection.  2) Chronic right knee pain; s/p right TKA.  Surgery was done in a town on [place], so she says she has been told by her current orthopedist that "he won't touch it" b/c  it is "someone else's work".  I am unsure if this is actually true, but I don't think the pt has any reason to lie about this type of thing.  At any rate, will check right knee plain film at this time. This right knee pain plus her back pain has impaired her ability to start exercising, which impairs her ability to lose wt and help alleviate her obesity hypoventilation syndrome and the ongoing stress on her joints.  3) Insulin resistance; will recheck HbA1c today.  4) Chronic fatigue: multifactorial-obesity, obesity hypoventilation syndrome, polypharmacy.  Most recent med change was her bp med.  She says ever since she was put on metoprolol her fatigue (and leg pains??) have been worse. Will d/c metoprolol, switch to cardizem CD 180 mg qd (as per her insurance formulary, this med is her next best option) Of note, fatigue and DOE has been a complaint at every visit I have ever seen her for, and multiple workups by myself and specialists have turned up obesity hypoventilation syndrome and morbid obesity as the cause.   Will check CBC and CMET today.  Flu vaccine IM today.  An After Visit Summary was printed and given to the patient.  FOLLOW UP: 4 wks

## 2014-05-18 NOTE — Progress Notes (Signed)
Pre visit review using our clinic review tool, if applicable. No additional management support is needed unless otherwise documented below in the visit note. 

## 2014-06-22 ENCOUNTER — Telehealth: Payer: Self-pay | Admitting: *Deleted

## 2014-06-22 NOTE — Telephone Encounter (Signed)
Patient called office stating that she is not better since she was seen in office 05/18/2014. Patient is requesting another rx be sent to pharmacy. Please advise?

## 2014-06-22 NOTE — Telephone Encounter (Signed)
Patient was talking about URI 2 weeks ago. Patient stated that you gave her a z-pack.

## 2014-06-22 NOTE — Telephone Encounter (Signed)
Patient notified

## 2014-06-22 NOTE — Telephone Encounter (Signed)
OK, she must have me confused with a different provider, because I have not seen her any time recently for a respiratory illness and I certainly didn't rx her a Z pack for a cold. I won't call in any meds for her at this time.

## 2014-06-22 NOTE — Telephone Encounter (Signed)
No further meds are indicated since they didn't help. She needs to make an appt with her orthopedist, Dr. [name], to get further eval for her hip pain--he may possibly be able to inject the area that is hurting.  That is not a procedure that I do.-thx

## 2014-07-17 ENCOUNTER — Other Ambulatory Visit: Payer: Self-pay | Admitting: Family Medicine

## 2014-07-17 MED ORDER — LEVOTHYROXINE SODIUM 200 MCG PO TABS: ORAL_TABLET | ORAL | Status: DC

## 2014-07-17 MED ORDER — DILTIAZEM HCL ER COATED BEADS 180 MG PO CP24: 180.0000 mg | ORAL_CAPSULE | Freq: Every day | ORAL | Status: DC

## 2014-07-19 ENCOUNTER — Ambulatory Visit: Payer: No Typology Code available for payment source | Admitting: Family Medicine

## 2014-07-20 ENCOUNTER — Encounter: Payer: Self-pay | Admitting: Family Medicine

## 2014-07-20 ENCOUNTER — Ambulatory Visit (INDEPENDENT_AMBULATORY_CARE_PROVIDER_SITE_OTHER): Payer: No Typology Code available for payment source | Admitting: Family Medicine

## 2014-07-20 VITALS — BP 178/129 | HR 82 | Temp 98.5°F | Resp 22 | Ht 64.0 in | Wt 302.0 lb

## 2014-07-20 DIAGNOSIS — I10 Essential (primary) hypertension: Secondary | ICD-10-CM | POA: Diagnosis not present

## 2014-07-20 DIAGNOSIS — E039 Hypothyroidism, unspecified: Secondary | ICD-10-CM | POA: Diagnosis not present

## 2014-07-20 DIAGNOSIS — R350 Frequency of micturition: Secondary | ICD-10-CM | POA: Diagnosis not present

## 2014-07-20 DIAGNOSIS — R5382 Chronic fatigue, unspecified: Secondary | ICD-10-CM

## 2014-07-20 LAB — POCT URINALYSIS DIPSTICK
Bilirubin, UA: NEGATIVE
Blood, UA: NEGATIVE
GLUCOSE UA: NEGATIVE
LEUKOCYTES UA: NEGATIVE
NITRITE UA: NEGATIVE
Protein, UA: NEGATIVE
Spec Grav, UA: 1.025
UROBILINOGEN UA: 0.2
pH, UA: 5.5

## 2014-07-20 LAB — COMPREHENSIVE METABOLIC PANEL
ALBUMIN: 4.5 g/dL (ref 3.5–5.2)
ALT: 18 U/L (ref 0–35)
AST: 17 U/L (ref 0–37)
Alkaline Phosphatase: 89 U/L (ref 39–117)
BUN: 13 mg/dL (ref 6–23)
CALCIUM: 9.7 mg/dL (ref 8.4–10.5)
CHLORIDE: 107 meq/L (ref 96–112)
CO2: 27 mEq/L (ref 19–32)
Creatinine, Ser: 0.66 mg/dL (ref 0.40–1.20)
GFR: 97.12 mL/min (ref >60.00)
Glucose, Bld: 80 mg/dL (ref 70–99)
Potassium: 4 mEq/L (ref 3.5–5.1)
Sodium: 139 mEq/L (ref 135–145)
TOTAL PROTEIN: 7.1 g/dL (ref 6.0–8.3)
Total Bilirubin: 0.5 mg/dL (ref 0.2–1.2)

## 2014-07-20 LAB — CBC WITH DIFFERENTIAL/PLATELET
Basophils Absolute: 0 10*3/uL (ref 0.0–0.1)
Basophils Relative: 0.6 % (ref 0.0–3.0)
EOS PCT: 3.7 % (ref 0.0–5.0)
Eosinophils Absolute: 0.2 10*3/uL (ref 0.0–0.7)
HEMATOCRIT: 41.8 % (ref 36.0–46.0)
Hemoglobin: 13.9 g/dL (ref 12.0–15.0)
LYMPHS ABS: 2.1 10*3/uL (ref 0.7–4.0)
Lymphocytes Relative: 33.4 % (ref 12.0–46.0)
MCHC: 33.2 g/dL (ref 30.0–36.0)
MCV: 89 fl (ref 78.0–100.0)
Monocytes Absolute: 0.5 10*3/uL (ref 0.1–1.0)
Monocytes Relative: 8.7 % (ref 3.0–12.0)
Neutro Abs: 3.3 10*3/uL (ref 1.4–7.7)
Neutrophils Relative %: 53.6 % (ref 43.0–77.0)
Platelets: 279 10*3/uL (ref 150.0–400.0)
RBC: 4.7 Mil/uL (ref 3.87–5.11)
RDW: 14.4 % (ref 11.5–15.5)
WBC: 6.2 10*3/uL (ref 4.0–10.5)

## 2014-07-20 LAB — VITAMIN B12: Vitamin B-12: 425 pg/mL (ref 211–911)

## 2014-07-20 LAB — TSH: TSH: 1.38 u[IU]/mL (ref 0.35–4.50)

## 2014-07-20 MED ORDER — AMLODIPINE BESYLATE-VALSARTAN 10-160 MG PO TABS: 1.0000 | ORAL_TABLET | Freq: Every day | ORAL | Status: DC

## 2014-07-20 NOTE — Progress Notes (Signed)
Pre visit review using our clinic review tool, if applicable. No additional management support is needed unless otherwise documented below in the visit note. 

## 2014-07-20 NOTE — Progress Notes (Signed)
OFFICE VISIT  07/20/2014   CC:  Chief Complaint  Patient presents with  . Fatigue  . Knee Pain   HPI:    Patient is a 59 y.o. Caucasian female who is here with her husband.  She presents again for feeling of worsening chronic fatigue and chronic feeling of SOB that is present at rest but worse with walking.  She denies CP, palpitations, fever, or cough.   She has chronic back and L knee pain, no changes lately.  Denies pain in calf or thigh. She has undergone lots of blood testing, cardiopulmonary testing, and pulmonology evaluations (x 2 different providers) for these symptoms in the past and the diagnoses arrived at each time are obesity hypoventilation syndrome and OSA.  She does not use a CPAP machine. She has had about 6 mo off of ACE-I now and this has not made any difference in her breathing complaints. She has hx of poorly controlled htn and does not monitor her bp at home.    ROS: nocturia x 5-6 + urge/stress incont which is chronic.  Chronic c/o of intermittent dizziness/lightheadedness (random, not positional).    Past Medical History  Diagnosis Date  . Insulin resistance   . Hypertension   . Hypothyroidism   . 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 DDD (degenerative disc disease)     Dr. [name] evaluated her and recommended pain mgmt MD.  She saw Dr. Selinda Orion for pain mgmt at one time.  Marland Kitchen GERD (gastroesophageal reflux disease)   . Insomnia   . Morbid obesity     BMI 49.  Gastric stapling surgery in distant past.  . Peripheral neuropathy     No old records to confirm pt's report.  Pt refuses to have more NCS b/c test is painful.  . Fibromyalgia     questionable  . Nephrolithiasis     Lithotripsy 2002.  Right percut nephrolith 05/2013.  Marland Kitchen Disequilibrium syndrome     MRI brain normal 2012  . DIZZINESS 04/12/2010    Qualifier: Diagnosis of  By: Anitra Lauth M.D., Brien Few   . Carpal tunnel syndrome on both sides 06/21/2010  . Mixed incontinence urge  and stress     and nocturia x 5-6 per night  . Microscopic hematuria 2014    CT abd pelv showed 1.8 cm right renal pelvis stone.  Also had UA c/w UTI so abx given.   . Shoulder pain     HX OF BILATERAL SHOULDER SURGERIES - PT HAS VERY LIMITED ROM - ESPECIALLY RAISING HER ARMS  . Complication of anesthesia     STATES SHE WAS TOLD SHE WOKE UP DURING HER KNEE REPLACEMENT SURGERY AND HAD TO BE GIVEN MORE MEDICINE - NOT SURE IF SPINAL OR GENERAL ANESTHESIA  . Chronic venous insufficiency     with edema  and extensive varicose dz: Dr. Linus Mako is managing this, planning laser procedures.; A LOT OF LEG CRAMPS AND LEG STIFFNESS  . Asthma     NOT NEEDED INHALER IN OVER A YEAR  . Heart murmur     functional, w/u done; PALPITATIONS IN THE PAST  . Sleep apnea     PT DOES NOT HAVE MASK OR TUBING - JUST SLEEPS WITH HEAD ELEVATED ON 2 PILLOWS   . History of pulmonary embolism 05/2013    Right sided.  Setting: post-op percutaneous nephrolithotomy--xarelto x 6 mo  . Superficial thrombophlebitis of left leg 05/2013    with cellulitis--resolved approp with  abx and ice  . Spinal stenosis     with L5 radiculopathy, also pseudospondylolisthesis per Dr. [name]  . Obesity hypoventilation syndrome   . Shortness of breath     CHRONIC: as of 12/2013 pt is in midst of 3 mo trial off of ACE-I to see if this med is responsible    Past Surgical History  Procedure Laterality Date  . Cholecystectomy  1987  . Joint replacement  2009    Right Knee, [facility]  . Foot surgery  2000    Left tarsel tunnel release  . Hernia repair   1980's    Diaphragmatic + gastric stapling  . Hernia repair      Inguinal  . Extracorporeal shock wave lithotripsy    . Endometrial ablation      menorrhagia  . Bilateral shoulder surgery    . Nephrolithotomy Right 05/31/2013    Procedure: NEPHROLITHOTOMY PERCUTANEOUS;  Surgeon: Irine Seal, MD;  Location: [facility];  Service: Urology;  Laterality: Right;  . Transthoracic  echocardiogram  12/16/13    EF 78-67%, grade 2 diastolic dysfxn, PA pressure 37 (mildly high)   MEDS: not taking prednisone listed below Outpatient Prescriptions Prior to Visit  Medication Sig Dispense Refill  . acetaminophen (TYLENOL) 500 MG tablet Take 1,000 mg by mouth every 6 (six) hours as needed for mild pain or headache.     Marland Kitchen aspirin EC 81 MG tablet Take 81 mg by mouth daily.    Marland Kitchen diltiazem (CARDIZEM CD) 180 MG 24 hr capsule Take 1 capsule (180 mg total) by mouth daily. 30 capsule 3  . levothyroxine (SYNTHROID, LEVOTHROID) 200 MCG tablet TAKE ONE TABLET BY MOUTH ONE TIME DAILY before breakfast 30 tablet 3  . LORazepam (ATIVAN) 1 MG tablet TAKE ONE TABLET BY MOUTH AT BEDTIME AS NEEDED FOR SLEEP  30 tablet 5  . metFORMIN (GLUCOPHAGE) 500 MG tablet Take 500 mg by mouth every evening.     Marland Kitchen oxyCODONE (ROXICODONE) 15 MG immediate release tablet Take 15 mg by mouth 4 (four) times daily. FOR BACK PAIN AND ARTHRITIS AND RT KNEE PAIN    . predniSONE (DELTASONE) 20 MG tablet 2 tabs po qd x 5d, then 1 tab po qd x 5d 15 tablet 0   No facility-administered medications prior to visit.    Allergies  Allergen Reactions  . Codeine Itching    Back hurts  . Mold Extract [Trichophyton] Nausea And Vomiting and Other (See Comments)    Sneezing real bad   . Morphine Itching    back hurts    ROS As per HPI  PE: Blood pressure 178/129, pulse 82, temperature 98.5 F (36.9 C), temperature source Temporal, resp. rate 22, height 5\' 4"  (1.626 m), weight 302 lb (136.986 kg), SpO2 99 %. Gen: Alert, well appearing.  Patient is oriented to person, place, time, and situation. ENT: Ears: EACs clear, normal epithelium.  TMs with good light reflex and landmarks bilaterally.  Eyes: no injection, icteris, swelling, or exudate.  EOMI, PERRLA. Nose: no drainage or turbinate edema/swelling.  No injection or focal lesion.  Mouth: lips without lesion/swelling.  Oral mucosa pink and moist.  Dentition intact and without  obvious caries or gingival swelling.  Oropharynx without erythema, exudate, or swelling.  CV: RRR, no m/r/g LUNGS: CTA bilat, nonlabored EXT: doughy bilat LE feeling to both LL's but no pitting edema present  LABS:  CC UA today: normal except trace ketones  IMPRESSION AND PLAN:  1) Uncontrolled HTN: continue cardizem CD  180mg  qd, add amlodipine-valsartan 10-160 qd today. Check bp at home once a day until f/u--bring numbers in for review at 2 wk f/u. (Historically she seemed well controlled when she was on ACE-I but we took her off of this to see if it was contributing to her SOB problem.  We replaced it with metoprolol and she complained that this made her fatigue worse so I switched her to cardizem CM 180 qd that she is currently on and seems to be tolerating fine).  2) Fatigue; chronic.  Multifactorial: obesity, obesity hypoventilation syndrome, polypharmacy, possibly her untreated OSA is the biggest contributor.  I asked her to arrange f/u appt with [facility] to possibly pursue another sleep study/CPAP retrial. Check CBC, CMET, TSH, vitamin B12 level.  3) Chronic SOB: obesity hypoventilation syndrome.  Patient not receptive of this diagnoses. I have asked her to revisit with pulm to see if anything else to offer (see #2 above).  I do not suspect/detect any acute process at this time.  An After Visit Summary was printed and given to the patient.  FOLLOW UP: Return in about 2 weeks (around 08/03/2014) for f/u HTN.

## 2014-07-20 NOTE — Patient Instructions (Signed)
Pls make follow up appointment with Dr. Melvyn Novas at [facility] pulmonology to discuss sleep apnea.

## 2014-07-21 ENCOUNTER — Telehealth: Payer: Self-pay | Admitting: Family Medicine

## 2014-07-21 MED ORDER — METFORMIN HCL 500 MG PO TABS: 500.0000 mg | ORAL_TABLET | Freq: Two times a day (BID) | ORAL | Status: DC

## 2014-07-21 NOTE — Telephone Encounter (Signed)
OK, sent metformin rx in (to take bid) as per pt request.

## 2014-07-21 NOTE — Telephone Encounter (Signed)
Left detailed message on pt's cell.  Okay per our last conversation.

## 2014-07-21 NOTE — Telephone Encounter (Signed)
Pt states she needs an RX for metformin.  I don't see that you have sent this medication in for a long time, she actually said her bottle states ( 07/28/13 )  In her instructions it said 1 tab in the evening but pt states she takes it BID.  Please advise.

## 2014-07-22 LAB — URINE CULTURE
Colony Count: NO GROWTH
Organism ID, Bacteria: NO GROWTH

## 2014-08-03 ENCOUNTER — Ambulatory Visit: Payer: No Typology Code available for payment source | Admitting: Family Medicine

## 2014-08-29 ENCOUNTER — Emergency Department (HOSPITAL_BASED_OUTPATIENT_CLINIC_OR_DEPARTMENT_OTHER): Payer: No Typology Code available for payment source

## 2014-08-29 ENCOUNTER — Encounter (HOSPITAL_BASED_OUTPATIENT_CLINIC_OR_DEPARTMENT_OTHER): Payer: Self-pay | Admitting: Emergency Medicine

## 2014-08-29 ENCOUNTER — Emergency Department (HOSPITAL_BASED_OUTPATIENT_CLINIC_OR_DEPARTMENT_OTHER)
Admission: EM | Admit: 2014-08-29 | Discharge: 2014-08-29 | Disposition: A | Discharge status: Home / Self Care | Payer: No Typology Code available for payment source | Attending: Emergency Medicine | Admitting: Emergency Medicine

## 2014-08-29 DIAGNOSIS — M1990 Unspecified osteoarthritis, unspecified site: Secondary | ICD-10-CM | POA: Diagnosis not present

## 2014-08-29 DIAGNOSIS — Z8719 Personal history of other diseases of the digestive system: Secondary | ICD-10-CM | POA: Diagnosis not present

## 2014-08-29 DIAGNOSIS — Z7982 Long term (current) use of aspirin: Secondary | ICD-10-CM | POA: Diagnosis not present

## 2014-08-29 DIAGNOSIS — E039 Hypothyroidism, unspecified: Secondary | ICD-10-CM | POA: Insufficient documentation

## 2014-08-29 DIAGNOSIS — Z8742 Personal history of other diseases of the female genital tract: Secondary | ICD-10-CM | POA: Diagnosis not present

## 2014-08-29 DIAGNOSIS — Z86711 Personal history of pulmonary embolism: Secondary | ICD-10-CM | POA: Diagnosis not present

## 2014-08-29 DIAGNOSIS — Z8669 Personal history of other diseases of the nervous system and sense organs: Secondary | ICD-10-CM | POA: Diagnosis not present

## 2014-08-29 DIAGNOSIS — J45909 Unspecified asthma, uncomplicated: Secondary | ICD-10-CM | POA: Insufficient documentation

## 2014-08-29 DIAGNOSIS — M7989 Other specified soft tissue disorders: Secondary | ICD-10-CM | POA: Diagnosis present

## 2014-08-29 DIAGNOSIS — L03116 Cellulitis of left lower limb: Secondary | ICD-10-CM | POA: Diagnosis not present

## 2014-08-29 DIAGNOSIS — Z79899 Other long term (current) drug therapy: Secondary | ICD-10-CM | POA: Insufficient documentation

## 2014-08-29 DIAGNOSIS — Z87442 Personal history of urinary calculi: Secondary | ICD-10-CM | POA: Insufficient documentation

## 2014-08-29 DIAGNOSIS — R011 Cardiac murmur, unspecified: Secondary | ICD-10-CM | POA: Diagnosis not present

## 2014-08-29 DIAGNOSIS — Z87891 Personal history of nicotine dependence: Secondary | ICD-10-CM | POA: Insufficient documentation

## 2014-08-29 DIAGNOSIS — I10 Essential (primary) hypertension: Secondary | ICD-10-CM | POA: Insufficient documentation

## 2014-08-29 LAB — CBC WITH DIFFERENTIAL/PLATELET
Basophils Absolute: 0 10*3/uL (ref 0.0–0.1)
Basophils Relative: 0 % (ref 0–1)
EOS ABS: 0.5 10*3/uL (ref 0.0–0.7)
Eosinophils Relative: 6 % — ABNORMAL HIGH (ref 0–5)
HCT: 40.4 % (ref 36.0–46.0)
Hemoglobin: 13.3 g/dL (ref 12.0–15.0)
LYMPHS ABS: 1.9 10*3/uL (ref 0.7–4.0)
LYMPHS PCT: 25 % (ref 12–46)
MCH: 30 pg (ref 26.0–34.0)
MCHC: 32.9 g/dL (ref 30.0–36.0)
MCV: 91.2 fL (ref 78.0–100.0)
Monocytes Absolute: 0.6 10*3/uL (ref 0.1–1.0)
Monocytes Relative: 8 % (ref 3–12)
NEUTROS PCT: 61 % (ref 43–77)
Neutro Abs: 4.6 10*3/uL (ref 1.7–7.7)
PLATELETS: 276 10*3/uL (ref 150–400)
RBC: 4.43 MIL/uL (ref 3.87–5.11)
RDW: 14.2 % (ref 11.5–15.5)
WBC: 7.6 10*3/uL (ref 4.0–10.5)

## 2014-08-29 LAB — BASIC METABOLIC PANEL
Anion gap: 10 (ref 5–15)
BUN: 12 mg/dL (ref 6–20)
CALCIUM: 9.7 mg/dL (ref 8.9–10.3)
CO2: 25 mmol/L (ref 22–32)
Chloride: 102 mmol/L (ref 101–111)
Creatinine, Ser: 0.69 mg/dL (ref 0.44–1.00)
GFR calc Af Amer: >60 mL/min (ref >60)
GFR calc non Af Amer: >60 mL/min (ref >60)
Glucose, Bld: 113 mg/dL — ABNORMAL HIGH (ref 65–99)
POTASSIUM: 3.6 mmol/L (ref 3.5–5.1)
SODIUM: 137 mmol/L (ref 135–145)

## 2014-08-29 MED ORDER — CEPHALEXIN 500 MG PO CAPS: 500.0000 mg | ORAL_CAPSULE | Freq: Four times a day (QID) | ORAL | Status: DC

## 2014-08-29 MED ORDER — ALBUTEROL SULFATE (2.5 MG/3ML) 0.083% IN NEBU
5.0000 mg | INHALATION_SOLUTION | Freq: Once | RESPIRATORY_TRACT | Status: AC
  Administered 2014-08-29: 5 mg via RESPIRATORY_TRACT
  Filled 2014-08-29: qty 6

## 2014-08-29 NOTE — Discharge Instructions (Signed)

## 2014-08-29 NOTE — ED Notes (Signed)
Pt in c/o L leg swelling and erythema, hx of DVT.

## 2014-08-29 NOTE — ED Provider Notes (Signed)
CSN: [id number]     Arrival date & time 08/29/14  1810 History  This chart was scribed for Leonard Schwartz, MD by Irene Pap, ED Scribe. This patient was seen in room MH06/MH06 and patient care was started at 7:43 PM.   Chief Complaint  Patient presents with  . Leg Swelling   The history is provided by the patient. No language interpreter was used.    HPI Comments: Tricia Perry is a 59 y.o. female with a history of DVT who presents to the Emergency Department complaining of left leg swelling and pain onset one week ago. She reports associated fever and SOB. She states that she has had symptoms like this in the past; she was hospitalized for kidney disease and was placed on blood thinners for her symptoms. She states that she has since stopped taking blood thinners. She denies any injury or any other symptoms.   Past Medical History  Diagnosis Date  . Insulin resistance   . Hypertension   . Hypothyroidism   . 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 DDD (degenerative disc disease)     Dr. [name] evaluated her and recommended pain mgmt MD.  She saw Dr. Selinda Orion for pain mgmt at one time.  Marland Kitchen GERD (gastroesophageal reflux disease)   . Insomnia   . Morbid obesity     BMI 49.  Gastric stapling surgery in distant past.  . Peripheral neuropathy     No old records to confirm pt's report.  Pt refuses to have more NCS b/c test is painful.  . Fibromyalgia     questionable  . Nephrolithiasis     Lithotripsy 2002.  Right percut nephrolith 05/2013.  Marland Kitchen Disequilibrium syndrome     MRI brain normal 2012  . DIZZINESS 04/12/2010    Qualifier: Diagnosis of  By: Anitra Lauth M.D., Brien Few   . Carpal tunnel syndrome on both sides 06/21/2010  . Mixed incontinence urge and stress     and nocturia x 5-6 per night  . Microscopic hematuria 2014    CT abd pelv showed 1.8 cm right renal pelvis stone.  Also had UA c/w UTI so abx given.   . Shoulder pain     HX OF BILATERAL SHOULDER  SURGERIES - PT HAS VERY LIMITED ROM - ESPECIALLY RAISING HER ARMS  . Complication of anesthesia     STATES SHE WAS TOLD SHE WOKE UP DURING HER KNEE REPLACEMENT SURGERY AND HAD TO BE GIVEN MORE MEDICINE - NOT SURE IF SPINAL OR GENERAL ANESTHESIA  . Chronic venous insufficiency     with edema  and extensive varicose dz: Dr. Linus Mako is managing this, planning laser procedures.; A LOT OF LEG CRAMPS AND LEG STIFFNESS  . Asthma     NOT NEEDED INHALER IN OVER A YEAR  . Heart murmur     functional, w/u done; PALPITATIONS IN THE PAST  . Sleep apnea     PT DOES NOT HAVE MASK OR TUBING - JUST SLEEPS WITH HEAD ELEVATED ON 2 PILLOWS   . History of pulmonary embolism 05/2013    Right sided.  Setting: post-op percutaneous nephrolithotomy--xarelto x 6 mo  . Superficial thrombophlebitis of left leg 05/2013    with cellulitis--resolved approp with abx and ice  . Spinal stenosis     with L5 radiculopathy, also pseudospondylolisthesis per Dr. [name]  . Obesity hypoventilation syndrome   . Shortness of breath     CHRONIC: as of 12/2013 pt  is in midst of 3 mo trial off of ACE-I to see if this med is responsible   Past Surgical History  Procedure Laterality Date  . Cholecystectomy  1987  . Joint replacement  2009    Right Knee, [facility]  . Foot surgery  2000    Left tarsel tunnel release  . Hernia repair   1980's    Diaphragmatic + gastric stapling  . Hernia repair      Inguinal  . Extracorporeal shock wave lithotripsy    . Endometrial ablation      menorrhagia  . Bilateral shoulder surgery    . Nephrolithotomy Right 05/31/2013    Procedure: NEPHROLITHOTOMY PERCUTANEOUS;  Surgeon: Irine Seal, MD;  Location: [facility];  Service: Urology;  Laterality: Right;  . Transthoracic echocardiogram  12/16/13    EF 54-62%, grade 2 diastolic dysfxn, PA pressure 37 (mildly high)  . Pft's  09/2013; 12/2013    09/2013 mild restriction.  12/2013 Low vital capacity, possibly due to restriction from pt's body  habitus.   Family History  Problem Relation Age of Onset  . Hypertension Mother   . Cancer Mother     Brain/Lung/ smoker  . Diabetes Mother   . Stroke Father   . Hypertension Father   . ADD / ADHD Daughter     ADHD  . Thyroid disease Brother   . Hypertension Brother   . Varicose Veins Brother   . Peripheral vascular disease Brother   . Heart attack Maternal Grandfather   . Alcohol abuse Paternal Grandfather    History  Substance Use Topics  . Smoking status: Former Smoker -- 1.00 packs/day for 5 years    Types: Cigarettes    Quit date: 03/31/1998  . Smokeless tobacco: Never Used  . Alcohol Use: Yes     Comment: social   OB History    No data available     Review of Systems A complete 10 system review of systems was obtained and all systems are negative except as noted in the HPI and PMH.   Allergies  Codeine; Mold extract; and Morphine  Home Medications   Prior to Admission medications   Medication Sig Start Date End Date Taking? Authorizing Provider  acetaminophen (TYLENOL) 500 MG tablet Take 1,000 mg by mouth every 6 (six) hours as needed for mild pain or headache.     Historical Provider, MD  amLODipine-valsartan (EXFORGE) 10-160 MG per tablet Take 1 tablet by mouth daily. 07/20/14   Tammi Sou, MD  aspirin EC 81 MG tablet Take 81 mg by mouth daily. 12/02/13   Tammi Sou, MD  cephALEXin (KEFLEX) 500 MG capsule Take 1 capsule (500 mg total) by mouth 4 (four) times daily. 08/29/14   Leonard Schwartz, MD  diltiazem (CARDIZEM CD) 180 MG 24 hr capsule Take 1 capsule (180 mg total) by mouth daily. 07/17/14   Tammi Sou, MD  levothyroxine (SYNTHROID, LEVOTHROID) 200 MCG tablet TAKE ONE TABLET BY MOUTH ONE TIME DAILY before breakfast 07/17/14   Tammi Sou, MD  LORazepam (ATIVAN) 1 MG tablet TAKE ONE TABLET BY MOUTH AT BEDTIME AS NEEDED FOR SLEEP  02/08/14   Tammi Sou, MD  metFORMIN (GLUCOPHAGE) 500 MG tablet Take 1 tablet (500 mg total) by mouth 2  (two) times daily with a meal. 07/21/14   Tammi Sou, MD  oxyCODONE (ROXICODONE) 15 MG immediate release tablet Take 15 mg by mouth 4 (four) times daily. FOR BACK PAIN AND ARTHRITIS AND  RT KNEE PAIN    Historical Provider, MD   BP 175/111 mmHg  Pulse 88  Temp(Src) 97.9 F (36.6 C) (Oral)  Resp 18  Ht 5' 4.5" (1.638 m)  Wt 290 lb (131.543 kg)  BMI 49.03 kg/m2  SpO2 100% Physical Exam Physical Exam  Nursing note and vitals reviewed. Constitutional: She is oriented to person, place, and time. She appears well-developed and well-nourished. No distress.  HENT:  Head: Normocephalic and atraumatic.  Eyes: Pupils are equal, round, and reactive to light.  Neck: Normal range of motion.  Cardiovascular: Normal rate and intact distal pulses.   Pulmonary/Chest: No respiratory distress.  scattered mild wheezes Abdominal: Normal appearance. She exhibits no distension.  Musculoskeletal: Normal range of motion.  patient has some redness streaking on the anterior part of the left leg extending to the medial aspect of the left thigh.  Some tenderness to palpation.  Good pulses in both extremities. Neurological: She is alert and oriented to person, place, and time. No cranial nerve deficit.  Skin: Skin is warm and dry. No rash noted.  Psychiatric: She has a normal mood and affect. Her behavior is normal.   ED Course  Procedures (including critical care time) Medications  albuterol (PROVENTIL) (2.5 MG/3ML) 0.083% nebulizer solution 5 mg (not administered)   Venous Doppler done showed no evidence of deep venous thrombosis. DIAGNOSTIC STUDIES: Oxygen Saturation is 100% on room air, normal by my interpretation.    COORDINATION OF CARE: 7:46 PM-Discussed treatment plan which includes labs with pt at bedside and pt agreed to plan.   Labs Review Labs Reviewed  BASIC METABOLIC PANEL - Abnormal; Notable for the following:    Glucose, Bld 113 (*)    All other components within normal limits  CBC  WITH DIFFERENTIAL/PLATELET - Abnormal; Notable for the following:    Eosinophils Relative 6 (*)    All other components within normal limits    MDM   Final diagnoses:  Cellulitis of left lower extremity    I personally performed the services described in this documentation, which was scribed in my presence. The recorded information has been reviewed and considered.   Leonard Schwartz, MD 08/29/14 [phone]

## 2014-09-08 ENCOUNTER — Other Ambulatory Visit: Payer: Self-pay | Admitting: *Deleted

## 2014-09-08 MED ORDER — LORAZEPAM 1 MG PO TABS: 1.0000 mg | ORAL_TABLET | Freq: Every evening | ORAL | Status: DC | PRN

## 2014-09-08 NOTE — Telephone Encounter (Signed)
Fax from [facility] requesting a refill for Lorazepam. LOV 07/20/14, no up coming ov, 02/08/14 #30 w/ 5RF. Please advise. Thanks.

## 2014-11-07 ENCOUNTER — Telehealth: Payer: Self-pay | Admitting: *Deleted

## 2014-11-07 MED ORDER — DILTIAZEM HCL ER COATED BEADS 180 MG PO CP24: 180.0000 mg | ORAL_CAPSULE | Freq: Every day | ORAL | Status: DC

## 2014-11-07 MED ORDER — LEVOTHYROXINE SODIUM 200 MCG PO TABS: ORAL_TABLET | ORAL | Status: AC

## 2014-11-07 NOTE — Telephone Encounter (Signed)
RF request for diltiazem LOV: 07/20/14 Next ov: None Last written: 07/17/14 #30 w/ 3RF Left message for pt to call back, have refilled for #30 w/ 0RF, needs to schedule f/u for HTN with Dr. Anitra Lauth.

## 2014-11-07 NOTE — Telephone Encounter (Signed)
RF request for levothyroxine LOV: 07/20/14 Next ov: None Last written: Unknown

## 2014-11-09 ENCOUNTER — Other Ambulatory Visit: Payer: Self-pay | Admitting: *Deleted

## 2014-11-09 NOTE — Telephone Encounter (Signed)
Pt advised and voiced understanding.  Apt made for 11/15/14 at 2:30pm.

## 2014-11-09 NOTE — Telephone Encounter (Signed)
Pt should still have several RF's left on most recent rx done 08/2014.

## 2014-11-09 NOTE — Telephone Encounter (Signed)
Pt LMOM on 11/09/14 at 12:24pm requesting refill RF request for lorazepam LOV: 07/20/14 Next ov: None Last written: 09/08/14 #30 w/ 5RF.  Please advise. Thanks.

## 2014-11-09 NOTE — Telephone Encounter (Signed)
Left message for pt to call back  °

## 2014-11-14 NOTE — Telephone Encounter (Signed)
Pt advised and voiced understanding.   

## 2014-11-14 NOTE — Telephone Encounter (Signed)
Left message for pt to call back  °

## 2014-11-15 ENCOUNTER — Encounter: Payer: Self-pay | Admitting: Family Medicine

## 2014-11-15 ENCOUNTER — Ambulatory Visit (INDEPENDENT_AMBULATORY_CARE_PROVIDER_SITE_OTHER): Payer: No Typology Code available for payment source | Admitting: Family Medicine

## 2014-11-15 VITALS — BP 150/94 | HR 79 | Temp 98.3°F | Resp 16 | Ht 64.0 in | Wt 307.0 lb

## 2014-11-15 DIAGNOSIS — I10 Essential (primary) hypertension: Secondary | ICD-10-CM | POA: Diagnosis not present

## 2014-11-15 DIAGNOSIS — Z91199 Patient's noncompliance with other medical treatment and regimen due to unspecified reason: Secondary | ICD-10-CM

## 2014-11-15 DIAGNOSIS — Z9119 Patient's noncompliance with other medical treatment and regimen: Secondary | ICD-10-CM

## 2014-11-15 MED ORDER — DILTIAZEM HCL ER COATED BEADS 180 MG PO CP24: 180.0000 mg | ORAL_CAPSULE | Freq: Every day | ORAL | Status: DC

## 2014-11-15 MED ORDER — AMLODIPINE BESYLATE-VALSARTAN 10-160 MG PO TABS: ORAL_TABLET | ORAL | Status: DC

## 2014-11-15 NOTE — Progress Notes (Signed)
OFFICE VISIT  11/15/2014   CC:  Chief Complaint  Patient presents with  . Follow-up     HPI:    Patient is a 59 y.o. Caucasian female who presents for 4 mo f/u HTN.  She missed a 2 week f/u appt after last visit. She says she took the amlodipine-valsartan that I rx'd for her last visit but only for 1 mo. Not monitoring bp with any regularity at home but when she does check it it is high--stage 2 HTN range.  She doesn't seem to understand that this condition requires CHRONIC treatment.   Past Medical History  Diagnosis Date  . Insulin resistance   . Hypertension   . Hypothyroidism   . 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 DDD (degenerative disc disease)     Dr. [name] evaluated her and recommended pain mgmt MD.  She saw Dr. Selinda Orion for pain mgmt at one time.  Marland Kitchen GERD (gastroesophageal reflux disease)   . Insomnia   . Morbid obesity     BMI 49.  Gastric stapling surgery in distant past.  . Peripheral neuropathy     No old records to confirm pt's report.  Pt refuses to have more NCS b/c test is painful.  . Fibromyalgia     questionable  . Nephrolithiasis     Lithotripsy 2002.  Right percut nephrolith 05/2013.  Marland Kitchen Disequilibrium syndrome     MRI brain normal 2012  . DIZZINESS 04/12/2010    Qualifier: Diagnosis of  By: Anitra Lauth M.D., Brien Few   . Carpal tunnel syndrome on both sides 06/21/2010  . Mixed incontinence urge and stress     and nocturia x 5-6 per night  . Microscopic hematuria 2014    CT abd pelv showed 1.8 cm right renal pelvis stone.  Also had UA c/w UTI so abx given.   . Shoulder pain     HX OF BILATERAL SHOULDER SURGERIES - PT HAS VERY LIMITED ROM - ESPECIALLY RAISING HER ARMS  . Complication of anesthesia     STATES SHE WAS TOLD SHE WOKE UP DURING HER KNEE REPLACEMENT SURGERY AND HAD TO BE GIVEN MORE MEDICINE - NOT SURE IF SPINAL OR GENERAL ANESTHESIA  . Chronic venous insufficiency     with edema  and extensive varicose dz: Dr. Linus Mako is managing this, planning laser procedures.; A LOT OF LEG CRAMPS AND LEG STIFFNESS  . Asthma     NOT NEEDED INHALER IN OVER A YEAR  . Heart murmur     functional, w/u done; PALPITATIONS IN THE PAST  . Sleep apnea     PT DOES NOT HAVE MASK OR TUBING - JUST SLEEPS WITH HEAD ELEVATED ON 2 PILLOWS   . History of pulmonary embolism 05/2013    Right sided.  Setting: post-op percutaneous nephrolithotomy--xarelto x 6 mo  . Superficial thrombophlebitis of left leg 05/2013    with cellulitis--resolved approp with abx and ice  . Spinal stenosis     with L5 radiculopathy, also pseudospondylolisthesis per Dr. [name]  . Obesity hypoventilation syndrome   . Shortness of breath     CHRONIC: as of 12/2013 pt is in midst of 3 mo trial off of ACE-I to see if this med is responsible    Past Surgical History  Procedure Laterality Date  . Cholecystectomy  1987  . Joint replacement  2009    Right Knee, [facility]  . Foot surgery  2000    Left  tarsel tunnel release  . Hernia repair   1980's    Diaphragmatic + gastric stapling  . Hernia repair      Inguinal  . Extracorporeal shock wave lithotripsy    . Endometrial ablation      menorrhagia  . Bilateral shoulder surgery    . Nephrolithotomy Right 05/31/2013    Procedure: NEPHROLITHOTOMY PERCUTANEOUS;  Surgeon: Irine Seal, MD;  Location: [facility];  Service: Urology;  Laterality: Right;  . Transthoracic echocardiogram  12/16/13    EF 39-53%, grade 2 diastolic dysfxn, PA pressure 37 (mildly high)  . Pft's  09/2013; 12/2013    09/2013 mild restriction.  12/2013 Low vital capacity, possibly due to restriction from pt's body habitus.    Outpatient Prescriptions Prior to Visit  Medication Sig Dispense Refill  . acetaminophen (TYLENOL) 500 MG tablet Take 1,000 mg by mouth every 6 (six) hours as needed for mild pain or headache.     Marland Kitchen aspirin EC 81 MG tablet Take 81 mg by mouth daily.    Marland Kitchen diltiazem (CARDIZEM CD) 180 MG 24 hr capsule Take 1 capsule  (180 mg total) by mouth daily. 30 capsule 0  . levothyroxine (SYNTHROID, LEVOTHROID) 200 MCG tablet TAKE ONE TABLET BY MOUTH ONE TIME DAILY before breakfast 30 tablet 11  . LORazepam (ATIVAN) 1 MG tablet Take 1 tablet (1 mg total) by mouth at bedtime as needed. 30 tablet 5  . metFORMIN (GLUCOPHAGE) 500 MG tablet Take 1 tablet (500 mg total) by mouth 2 (two) times daily with a meal. 60 tablet 11  . oxyCODONE (ROXICODONE) 15 MG immediate release tablet Take 15 mg by mouth 4 (four) times daily. FOR BACK PAIN AND ARTHRITIS AND RT KNEE PAIN    . amLODipine-valsartan (EXFORGE) 10-160 MG per tablet Take 1 tablet by mouth daily. (Patient not taking: Reported on 11/15/2014) 30 tablet 1  . cephALEXin (KEFLEX) 500 MG capsule Take 1 capsule (500 mg total) by mouth 4 (four) times daily. (Patient not taking: Reported on 11/15/2014) 28 capsule 0   No facility-administered medications prior to visit.    Allergies  Allergen Reactions  . Codeine Itching    Back hurts  . Mold Extract [Trichophyton] Nausea And Vomiting and Other (See Comments)    Sneezing real bad   . Morphine Itching    back hurts    ROS As per HPI  PE: Blood pressure 150/94, pulse 79, temperature 98.3 F (36.8 C), temperature source Oral, resp. rate 16, height 5\' 4"  (1.626 m), weight 307 lb (139.254 kg), SpO2 98 %. Gen: Alert, well appearing.  Patient is oriented to person, place, time, and situation. No further exam today.  LABS:  none  IMPRESSION AND PLAN:  Essential HTN; noncompliant patient. RF'd amlodipine-valsartan 10-160, take 1 qd. Continue Cardizem CD 180 mg qd. Check bp daily and return in 2 wks to review these with me.  An After Visit Summary was printed and given to the patient. She may consider switching to a provider in [facility] b/c the drive out here is pretty far for her.    FOLLOW UP: No Follow-up on file.

## 2014-11-15 NOTE — Progress Notes (Signed)
Pre visit review using our clinic review tool, if applicable. No additional management support is needed unless otherwise documented below in the visit note. 

## 2014-11-22 ENCOUNTER — Telehealth: Payer: Self-pay | Admitting: Family Medicine

## 2014-11-22 NOTE — Telephone Encounter (Signed)
Please advise. Thanks.  

## 2014-11-22 NOTE — Telephone Encounter (Signed)
Pt is requesting a letter from Dr. Anitra Lauth stating she is unable to care for her daughter Tricia Perry. Tricia Perry is having [place] live with another family in another school district, and the school needs this letter stating Tricia Perry cannot take care of Tricia Perry. I advised Tricia Perry that someone would contact her in regards to this.

## 2014-11-23 ENCOUNTER — Encounter: Payer: Self-pay | Admitting: Family Medicine

## 2014-11-24 ENCOUNTER — Encounter: Payer: Self-pay | Admitting: Family Medicine

## 2014-11-29 ENCOUNTER — Encounter: Payer: Self-pay | Admitting: Family Medicine

## 2014-11-29 ENCOUNTER — Ambulatory Visit (INDEPENDENT_AMBULATORY_CARE_PROVIDER_SITE_OTHER): Payer: No Typology Code available for payment source | Admitting: Family Medicine

## 2014-11-29 VITALS — BP 129/83 | HR 82 | Temp 98.2°F | Resp 16 | Ht 64.0 in | Wt 308.0 lb

## 2014-11-29 DIAGNOSIS — I10 Essential (primary) hypertension: Secondary | ICD-10-CM | POA: Diagnosis not present

## 2014-11-29 DIAGNOSIS — Z96651 Presence of right artificial knee joint: Secondary | ICD-10-CM

## 2014-11-29 DIAGNOSIS — G8929 Other chronic pain: Secondary | ICD-10-CM

## 2014-11-29 DIAGNOSIS — M25561 Pain in right knee: Secondary | ICD-10-CM | POA: Diagnosis not present

## 2014-11-29 NOTE — Progress Notes (Signed)
Pre visit review using our clinic review tool, if applicable. No additional management support is needed unless otherwise documented below in the visit note. 

## 2014-11-29 NOTE — Progress Notes (Signed)
OFFICE VISIT  11/29/2014   CC:  Chief Complaint  Patient presents with  . Follow-up    HTN   HPI:    Patient is a 59 y.o. Caucasian female who presents for 2 week f/u uncontrolled HTN. Hx of noncompliance with med, got her back on exforge last visit. bP checks at home since last visit show avg 130s over 82N, occ 053Z systolic.  No hypotension. Has some nausea in mornings, feels it worse since getting back on exforge, lasts about 20 min.  No vomiting. She talks about poor sleep.  She talks about chronic fatigue.  No new complaints.  Started some nasal sprays recently by Dr. Harrington Challenger.  Had TKR on R knee by an othro in [place] in 2009.  Has ongoing pain in R knee and decreased ROM in R knee chronically. She has a local ortho, Dr. [name], who sees her but she says "he won't touch my right knee" b/c he didn't do past surgery on it. She asks for referral to ortho at [facility].   Past Medical History  Diagnosis Date  . Insulin resistance   . Hypertension   . Hypothyroidism   . 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 DDD (degenerative disc disease)     Dr. [name] evaluated her and recommended pain mgmt MD.  She saw Dr. Selinda Orion for pain mgmt at one time.  Marland Kitchen GERD (gastroesophageal reflux disease)   . Insomnia   . Morbid obesity     BMI 49.  Gastric stapling surgery in distant past.  . Peripheral neuropathy     No old records to confirm pt's report.  Pt refuses to have more NCS b/c test is painful.  . Fibromyalgia     questionable  . Nephrolithiasis     Lithotripsy 2002.  Right percut nephrolith 05/2013.  Marland Kitchen Disequilibrium syndrome     MRI brain normal 2012  . DIZZINESS 04/12/2010    Qualifier: Diagnosis of  By: Anitra Lauth M.D., Brien Few   . Carpal tunnel syndrome on both sides 06/21/2010  . Mixed incontinence urge and stress     and nocturia x 5-6 per night  . Microscopic hematuria 2014    CT abd pelv showed 1.8 cm right renal pelvis stone.  Also had UA c/w UTI so  abx given.   . Shoulder pain     HX OF BILATERAL SHOULDER SURGERIES - PT HAS VERY LIMITED ROM - ESPECIALLY RAISING HER ARMS  . Complication of anesthesia     STATES SHE WAS TOLD SHE WOKE UP DURING HER KNEE REPLACEMENT SURGERY AND HAD TO BE GIVEN MORE MEDICINE - NOT SURE IF SPINAL OR GENERAL ANESTHESIA  . Chronic venous insufficiency     with edema  and extensive varicose dz: Dr. Linus Mako is managing this, planning laser procedures.; A LOT OF LEG CRAMPS AND LEG STIFFNESS  . Heart murmur     functional, w/u done; PALPITATIONS IN THE PAST  . Sleep apnea     PT DOES NOT HAVE MASK OR TUBING - JUST SLEEPS WITH HEAD ELEVATED ON 2 PILLOWS   . History of pulmonary embolism 05/2013    Right sided.  Setting: post-op percutaneous nephrolithotomy--xarelto x 6 mo  . Superficial thrombophlebitis of left leg 05/2013    with cellulitis--resolved approp with abx and ice  . Spinal stenosis     with L5 radiculopathy, also pseudospondylolisthesis per Dr. [name]  . Obesity hypoventilation syndrome   . Shortness of  breath     CHRONIC; 3 mo trial off of ACE-I made no difference.  Spirometry x 2 has shown no obstruction.  Allergy w/u NEG.  . Chronic nonallergic rhinitis     allergy eval NEG 10/2014: azelastine and flonase spray rx'd    Past Surgical History  Procedure Laterality Date  . Cholecystectomy  1987  . Joint replacement  2009    Right Knee, [facility]  . Foot surgery  2000    Left tarsel tunnel release  . Hernia repair   1980's    Diaphragmatic + gastric stapling  . Hernia repair      Inguinal  . Extracorporeal shock wave lithotripsy    . Endometrial ablation      menorrhagia  . Bilateral shoulder surgery    . Nephrolithotomy Right 05/31/2013    Procedure: NEPHROLITHOTOMY PERCUTANEOUS;  Surgeon: Irine Seal, MD;  Location: [facility];  Service: Urology;  Laterality: Right;  . Transthoracic echocardiogram  12/16/13    EF 78-29%, grade 2 diastolic dysfxn, PA pressure 37 (mildly high)  . Pft's   09/2013; 12/2013    09/2013 mild restriction.  12/2013 Low vital capacity, possibly due to restriction from pt's body habitus.    Outpatient Prescriptions Prior to Visit  Medication Sig Dispense Refill  . acetaminophen (TYLENOL) 500 MG tablet Take 1,000 mg by mouth every 6 (six) hours as needed for mild pain or headache.     Marland Kitchen amLODipine-valsartan (EXFORGE) 10-160 MG per tablet 1 tab po qd 30 tablet 3  . aspirin EC 81 MG tablet Take 81 mg by mouth daily.    Marland Kitchen diltiazem (CARDIZEM CD) 180 MG 24 hr capsule Take 1 capsule (180 mg total) by mouth daily. 30 capsule 3  . levothyroxine (SYNTHROID, LEVOTHROID) 200 MCG tablet TAKE ONE TABLET BY MOUTH ONE TIME DAILY before breakfast 30 tablet 11  . LORazepam (ATIVAN) 1 MG tablet Take 1 tablet (1 mg total) by mouth at bedtime as needed. 30 tablet 5  . metFORMIN (GLUCOPHAGE) 500 MG tablet Take 1 tablet (500 mg total) by mouth 2 (two) times daily with a meal. 60 tablet 11  . oxyCODONE (ROXICODONE) 15 MG immediate release tablet Take 15 mg by mouth 4 (four) times daily. FOR BACK PAIN AND ARTHRITIS AND RT KNEE PAIN     No facility-administered medications prior to visit.    Allergies  Allergen Reactions  . Codeine Itching    Back hurts  . Mold Extract [Trichophyton] Nausea And Vomiting and Other (See Comments)    Sneezing real bad   . Morphine Itching    back hurts    ROS As per HPI  PE: Blood pressure 129/83, pulse 82, temperature 98.2 F (36.8 C), temperature source Oral, resp. rate 16, height 5\' 4"  (1.626 m), weight 308 lb (139.708 kg), SpO2 99 %. Gen: Alert, well appearing.  Patient is oriented to person, place, time, and situation. CV: RRR, no m/r/g Knees: remarkable for excessive redundant soft tissue/obesity.  No erythema or warmth. R knee extension is fully intact but she can only flex R knee to 90 deg due to pain.    LABS:    Chemistry      Component Value Date/Time   NA 137 08/29/2014 1945   K 3.6 08/29/2014 1945   CL 102  08/29/2014 1945   CO2 25 08/29/2014 1945   BUN 12 08/29/2014 1945   CREATININE 0.69 08/29/2014 1945   CREATININE 0.70 12/19/2013 1343      Component Value  Date/Time   CALCIUM 9.7 08/29/2014 1945   ALKPHOS 89 07/20/2014 1357   AST 17 07/20/2014 1357   ALT 18 07/20/2014 1357   BILITOT 0.5 07/20/2014 1357     Lab Results  Component Value Date   TSH 1.38 07/20/2014   Lab Results  Component Value Date   WBC 7.6 08/29/2014   HGB 13.3 08/29/2014   HCT 40.4 08/29/2014   MCV 91.2 08/29/2014   PLT 276 08/29/2014   Lab Results  Component Value Date   HGBA1C 6.2 05/18/2014    IMPRESSION AND PLAN:  1) HTN, now well controlled. The current medical regimen is effective;  continue present plan and medications.  2) Chronic R>L knee pain, hx of TKA on right: as per pt's request, will refer her to [facility] ortho for further evaluation and management of R knee.  FOLLOW UP: Return in about 6 months (around 05/29/2015) for routine chronic illness f/u.

## 2014-12-13 ENCOUNTER — Telehealth: Payer: Self-pay | Admitting: Family Medicine

## 2014-12-13 MED ORDER — IRBESARTAN 300 MG PO TABS: 300.0000 mg | ORAL_TABLET | Freq: Every day | ORAL | Status: DC

## 2014-12-13 NOTE — Telephone Encounter (Signed)
Patient aware of new medication and instructions.  Pt had no questions at this time.

## 2014-12-13 NOTE — Telephone Encounter (Signed)
OK, tell pt to stop current bp med (amlodipine/valsartan combo pill) and pls eRx irbesartan 300mg , 1 tab po qd, #30, RF x 6.-thx

## 2014-12-13 NOTE — Telephone Encounter (Signed)
Patient feels like the new med for BP that you put her on is making her extremely fatigued and isn't helping her BP much.  Patient states her BP is up and down.  Patient states she can't even get out of bed.  Please advise if there is something else you can have her try?

## 2014-12-13 NOTE — Telephone Encounter (Signed)
LMOM for pt to CB.  Rx has been sent to pharmacy.

## 2014-12-30 DIAGNOSIS — N200 Calculus of kidney: Secondary | ICD-10-CM

## 2014-12-30 HISTORY — DX: Calculus of kidney: N20.0

## 2015-01-27 ENCOUNTER — Encounter: Payer: Self-pay | Admitting: Family Medicine

## 2015-01-28 ENCOUNTER — Encounter: Payer: Self-pay | Admitting: Family Medicine

## 2015-01-30 ENCOUNTER — Telehealth: Payer: Self-pay | Admitting: *Deleted

## 2015-01-30 DIAGNOSIS — E875 Hyperkalemia: Secondary | ICD-10-CM

## 2015-01-30 NOTE — Telephone Encounter (Signed)
Pt LMOM on 01/30/15 at 1:39pm requesting a call back in regards to labs that was being faxed to Dr. Anitra Lauth from another office.   Left message for pt to call back.   Pt called back. I advised her that we have not received labs from her Urologist (Dr. Jeffie Pollock) yet. She is requesting a call back once the labs come in and have been reviewed by Dr. Anitra Lauth.

## 2015-01-31 NOTE — Telephone Encounter (Signed)
Labs reviewed: potassium 5.3 (ULN is 5.2).  Renal panel o/w normal. Make sure pt is not taking any potassium supplements. Ask her to avoid high potassium foods/drinks. Recheck potassium level (lab visit only) one day next week (BMET, dx hyperkalemia)-thx

## 2015-01-31 NOTE — Telephone Encounter (Signed)
Pt advised and voiced understanding. She stated that she will call back to schedule lab visit. Lab order put in EMR as future order.

## 2015-02-07 ENCOUNTER — Other Ambulatory Visit (INDEPENDENT_AMBULATORY_CARE_PROVIDER_SITE_OTHER): Payer: No Typology Code available for payment source

## 2015-02-07 DIAGNOSIS — E875 Hyperkalemia: Secondary | ICD-10-CM

## 2015-02-07 LAB — BASIC METABOLIC PANEL
BUN: 14 mg/dL (ref 6–23)
CO2: 26 mEq/L (ref 19–32)
CREATININE: 0.71 mg/dL (ref 0.40–1.20)
Calcium: 9.8 mg/dL (ref 8.4–10.5)
Chloride: 104 mEq/L (ref 96–112)
GFR: 89.11 mL/min (ref >60.00)
Glucose, Bld: 95 mg/dL (ref 70–99)
Potassium: 4.4 mEq/L (ref 3.5–5.1)
Sodium: 139 mEq/L (ref 135–145)

## 2015-02-19 ENCOUNTER — Ambulatory Visit (INDEPENDENT_AMBULATORY_CARE_PROVIDER_SITE_OTHER): Payer: No Typology Code available for payment source | Admitting: Family Medicine

## 2015-02-19 ENCOUNTER — Encounter: Payer: Self-pay | Admitting: Family Medicine

## 2015-02-19 VITALS — BP 147/82 | HR 67 | Temp 98.2°F | Resp 20 | Wt 316.0 lb

## 2015-02-19 DIAGNOSIS — J0100 Acute maxillary sinusitis, unspecified: Secondary | ICD-10-CM | POA: Diagnosis not present

## 2015-02-19 DIAGNOSIS — J320 Chronic maxillary sinusitis: Secondary | ICD-10-CM | POA: Insufficient documentation

## 2015-02-19 MED ORDER — BENZONATATE 100 MG PO CAPS: 200.0000 mg | ORAL_CAPSULE | Freq: Two times a day (BID) | ORAL | Status: DC

## 2015-02-19 MED ORDER — DOXYCYCLINE HYCLATE 100 MG PO TABS: 100.0000 mg | ORAL_TABLET | Freq: Two times a day (BID) | ORAL | Status: DC

## 2015-02-19 NOTE — Patient Instructions (Signed)
Sinusitis, Adult Sinusitis is redness, soreness, and inflammation of the paranasal sinuses. Paranasal sinuses are air pockets within the bones of your face. They are located beneath your eyes, in the middle of your forehead, and above your eyes. In healthy paranasal sinuses, mucus is able to drain out, and air is able to circulate through them by way of your nose. However, when your paranasal sinuses are inflamed, mucus and air can become trapped. This can allow bacteria and other germs to grow and cause infection. Sinusitis can develop quickly and last only a short time (acute) or continue over a long period (chronic). Sinusitis that lasts for more than 12 weeks is considered chronic. CAUSES Causes of sinusitis include:  Allergies.  Structural abnormalities, such as displacement of the cartilage that separates your nostrils (deviated septum), which can decrease the air flow through your nose and sinuses and affect sinus drainage.  Functional abnormalities, such as when the small hairs (cilia) that line your sinuses and help remove mucus do not work properly or are not present. SIGNS AND SYMPTOMS Symptoms of acute and chronic sinusitis are the same. The primary symptoms are pain and pressure around the affected sinuses. Other symptoms include:  Upper toothache.  Earache.  Headache.  Bad breath.  Decreased sense of smell and taste.  A cough, which worsens when you are lying flat.  Fatigue.  Fever.  Thick drainage from your nose, which often is green and may contain pus (purulent).  Swelling and warmth over the affected sinuses. DIAGNOSIS Your health care provider will perform a physical exam. During your exam, your health care provider may perform any of the following to help determine if you have acute sinusitis or chronic sinusitis:  Look in your nose for signs of abnormal growths in your nostrils (nasal polyps).  Tap over the affected sinus to check for signs of  infection.  View the inside of your sinuses using an imaging device that has a light attached (endoscope). If your health care provider suspects that you have chronic sinusitis, one or more of the following tests may be recommended:  Allergy tests.  Nasal culture. A sample of mucus is taken from your nose, sent to a lab, and screened for bacteria.  Nasal cytology. A sample of mucus is taken from your nose and examined by your health care provider to determine if your sinusitis is related to an allergy. TREATMENT Most cases of acute sinusitis are related to a viral infection and will resolve on their own within 10 days. Sometimes, medicines are prescribed to help relieve symptoms of both acute and chronic sinusitis. These may include pain medicines, decongestants, nasal steroid sprays, or saline sprays. However, for sinusitis related to a bacterial infection, your health care provider will prescribe antibiotic medicines. These are medicines that will help kill the bacteria causing the infection. Rarely, sinusitis is caused by a fungal infection. In these cases, your health care provider will prescribe antifungal medicine. For some cases of chronic sinusitis, surgery is needed. Generally, these are cases in which sinusitis recurs more than 3 times per year, despite other treatments. HOME CARE INSTRUCTIONS  Drink plenty of water. Water helps thin the mucus so your sinuses can drain more easily.  Use a humidifier.  Inhale steam 3-4 times a day (for example, sit in the bathroom with the shower running).  Apply a warm, moist washcloth to your face 3-4 times a day, or as directed by your health care provider.  Use saline nasal sprays to help   moisten and clean your sinuses.  Take medicines only as directed by your health care provider.  If you were prescribed either an antibiotic or antifungal medicine, finish it all even if you start to feel better. SEEK IMMEDIATE MEDICAL CARE IF:  You have  increasing pain or severe headaches.  You have nausea, vomiting, or drowsiness.  You have swelling around your face.  You have vision problems.  You have a stiff neck.  You have difficulty breathing.   This information is not intended to replace advice given to you by your health care provider. Make sure you discuss any questions you have with your health care provider.   Document Released: 03/17/2005 Document Revised: 04/07/2014 Document Reviewed: 04/01/2011 Elsevier Interactive Patient Education 2016 [place], [place], Mucinex, antibiotic, nasal spray

## 2015-02-19 NOTE — Progress Notes (Signed)
Subjective:    Patient ID: Tricia Perry, female    DOB: 10/06/1955, 59 y.o.   MRN: [id number]  HPI  Cough: Patient presents with a dry cough that started a little over a week ago,non-producitve. Cough feels worse at night. Pt endorses Rhinorrhea, congestion, can not sleep because of the cough,  fever and chills intermittently. Eating and drinking well. No Nausea, Vomit , Diarrhea or rash. Husband was sick, similar symptoms the past week.  Of note patient was removed off Acei in September for concerns of Ace-i induced breathing/upper airway issues. This cough is a new onset/frequency with other symptoms.    Past Medical History  Diagnosis Date  . Insulin resistance   . Hypertension   . Hypothyroidism   . 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 DDD (degenerative disc disease)     Dr. [name] evaluated her and recommended pain mgmt MD.  She saw Dr. Selinda Orion for pain mgmt at one time.  Marland Kitchen GERD (gastroesophageal reflux disease)   . Insomnia   . Morbid obesity (HCC)     BMI 49.  Gastric stapling surgery in distant past.  . Peripheral neuropathy ([place])     No old records to confirm pt's report.  Pt refuses to have more NCS b/c test is painful.  . Fibromyalgia     questionable  . Nephrolithiasis     Lithotripsy 2002.  Right percut nephrolith 05/2013.  Marland Kitchen Disequilibrium syndrome     MRI brain normal 2012  . DIZZINESS 04/12/2010    Qualifier: Diagnosis of  By: Anitra Lauth M.D., Brien Few   . Carpal tunnel syndrome on both sides 06/21/2010  . Mixed incontinence urge and stress     and nocturia x 5-6 per night  . Microscopic hematuria 2014    CT abd pelv showed 1.8 cm right renal pelvis stone.  Also had UA c/w UTI so abx given.   . Shoulder pain     HX OF BILATERAL SHOULDER SURGERIES - PT HAS VERY LIMITED ROM - ESPECIALLY RAISING HER ARMS  . Complication of anesthesia     STATES SHE WAS TOLD SHE WOKE UP DURING HER KNEE REPLACEMENT SURGERY AND HAD TO BE GIVEN MORE MEDICINE -  NOT SURE IF SPINAL OR GENERAL ANESTHESIA  . Chronic venous insufficiency     with edema  and extensive varicose dz: Dr. Linus Mako is managing this, planning laser procedures.; A LOT OF LEG CRAMPS AND LEG STIFFNESS  . Heart murmur     functional, w/u done; PALPITATIONS IN THE PAST  . Sleep apnea     PT DOES NOT HAVE MASK OR TUBING - JUST SLEEPS WITH HEAD ELEVATED ON 2 PILLOWS   . History of pulmonary embolism 05/2013    Right sided.  Setting: post-op percutaneous nephrolithotomy--xarelto x 6 mo  . Superficial thrombophlebitis of left leg 05/2013    with cellulitis--resolved approp with abx and ice  . Spinal stenosis     with L5 radiculopathy, also pseudospondylolisthesis per Dr. [name]  . Obesity hypoventilation syndrome ([place])   . Shortness of breath     CHRONIC; 3 mo trial off of ACE-I made no difference.  Spirometry x 2 has shown no obstruction.  Allergy w/u NEG.  . Chronic nonallergic rhinitis     allergy eval NEG 10/2014: azelastine and flonase spray rx'd  . OAB (overactive bladder)     Per Dr. Jeffie Pollock: pt declined pelvic floor PT.  Vesicare trial started 01/25/15.  Marland Kitchen  Kidney stone on right side 12/2014    Dr. Jeffie Pollock considering PCNL, ESWL, and ureteroscopy as of 01/25/15    Allergies  Allergen Reactions  . Codeine Itching    Back hurts  . Mold Extract [Trichophyton] Nausea And Vomiting and Other (See Comments)    Sneezing real bad   . Morphine Itching    back hurts   Social History   Social History  . Marital Status: Married    Spouse Name: Herbie Baltimore  . Number of Children: 3  . Years of Education: N/A   Occupational History  . HOUSEWIFE    Social History Main Topics  . Smoking status: Former Smoker -- 1.00 packs/day for 5 years    Types: Cigarettes    Quit date: 03/31/1998  . Smokeless tobacco: Never Used  . Alcohol Use: Yes     Comment: social  . Drug Use: No  . Sexual Activity: No   Other Topics Concern  . Not on file   Social History Narrative   Married,  currently living in [place].   Occupation: no.  Disabled secondary to chronic pain (osteoarthritis in back and knees).   Exercise: staying busy for 2-3 hours a day but no formal exercise due to chronic pain.   Diet: "normal" diet, trying to increase water.   No Tob/alc/drugs.          Review of Systems Negative, with the exception of above mentioned in HPI     Objective:   Physical Exam BP 147/82 mmHg  Pulse 67  Temp(Src) 98.2 F (36.8 C) (Oral)  Resp 20  Wt 316 lb (143.337 kg)  SpO2 98% Gen: Afebrile. No acute distress. Nontoxic in appearance. Obese caucasian female. HENT: AT. [place]. Bilateral TM visualized and normal in appearance. MMM. Bilateral nares mild erythema and swelling. Throat with erythema, no  exudates. Post nasal drip noted posterior pharynx. Cough and hoarseness present on exam. TTP right maxillary sinus.  Eyes:Pupils Equal Round Reactive to light, Extraocular movements intact,  Conjunctiva without redness, discharge or icterus. Neck/lymp/endocrine: Supple, mid lant cervical lymphadenopathy CV: RRR  Chest: CTAB, no wheeze or crackles  Assessment & Plan:  1. Acute maxillary sinusitis, recurrence not specified - Doxy, rest, hydrate, tessalon perles , mucinex continue nasal spray. - doxycycline (VIBRA-TABS) 100 MG tablet; Take 1 tablet (100 mg total) by mouth 2 (two) times daily.  Dispense: 20 tablet; Refill: 0 - benzonatate (TESSALON) 100 MG capsule; Take 2 capsules (200 mg total) by mouth 2 (two) times daily.  Dispense: 20 capsule; Refill: 0 -- f/U 1 week PRN

## 2015-03-31 IMAGING — US US RENAL
1 series · 14 of 25 positions shown · non-contrast
Comparison: None.

CLINICAL DATA: Evaluate for hydronephrosis.

EXAM:
RENAL/URINARY TRACT ULTRASOUND COMPLETE

[Series 1: us renal · 0.27mm/px · 14 of 39 slices shown]
[im 1/39]
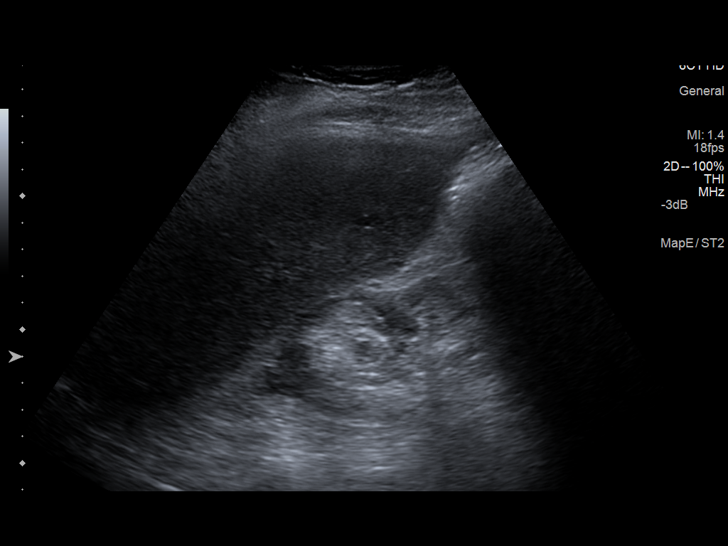
[im 4/39]
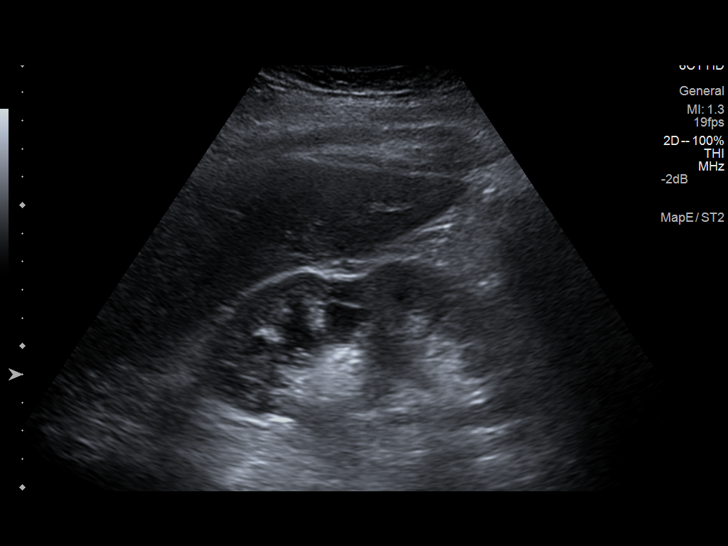
[im 7/39]
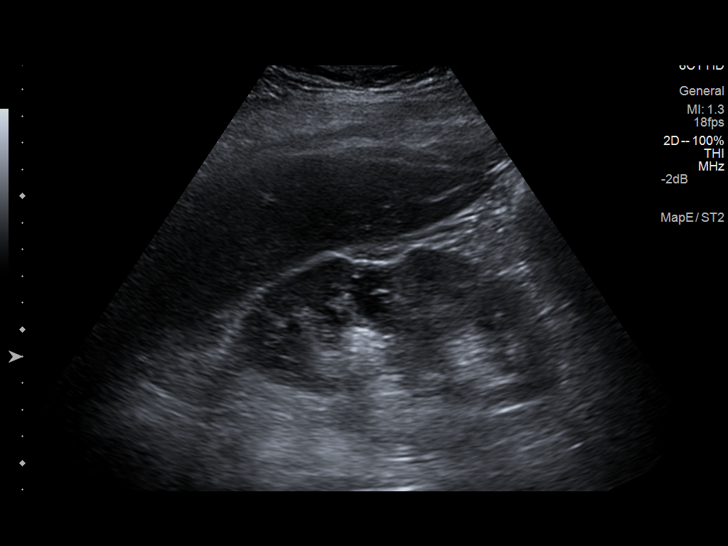
[im 10/39]
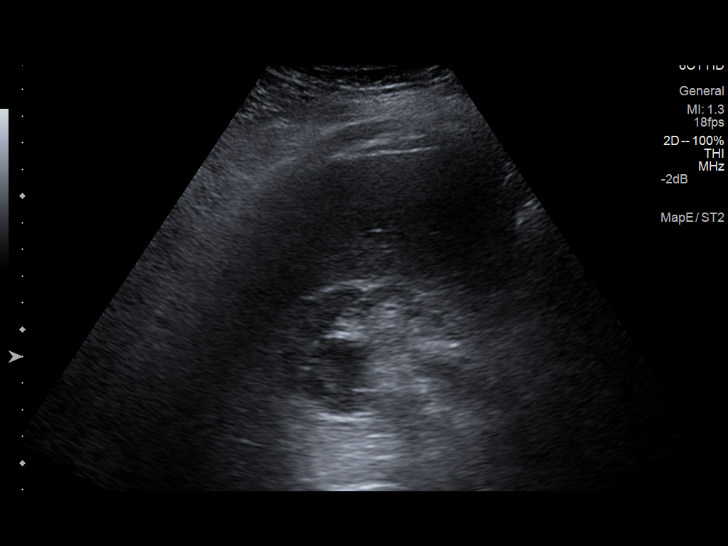
[im 13/39]
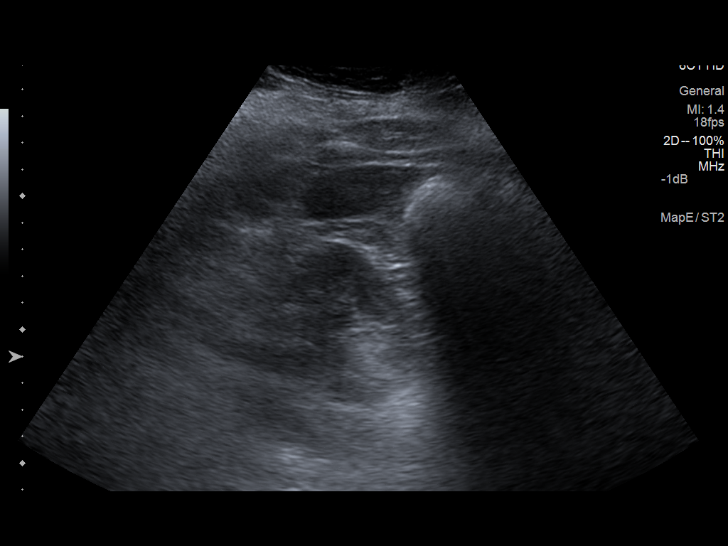
[im 15/39]
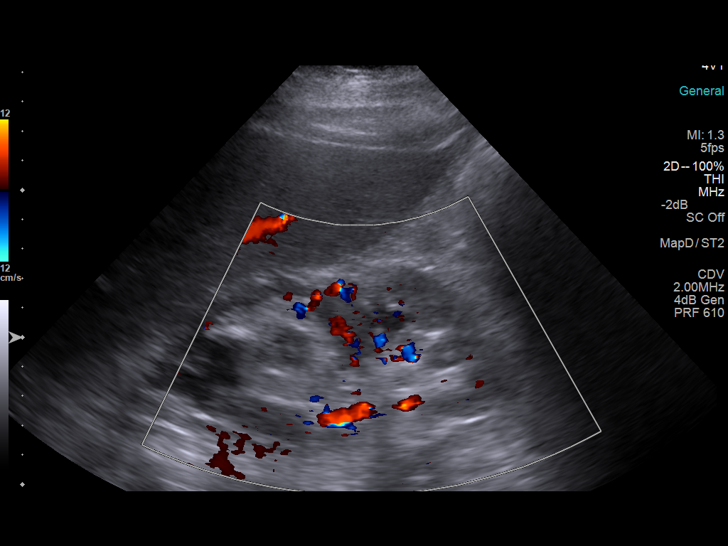
[im 18/39]
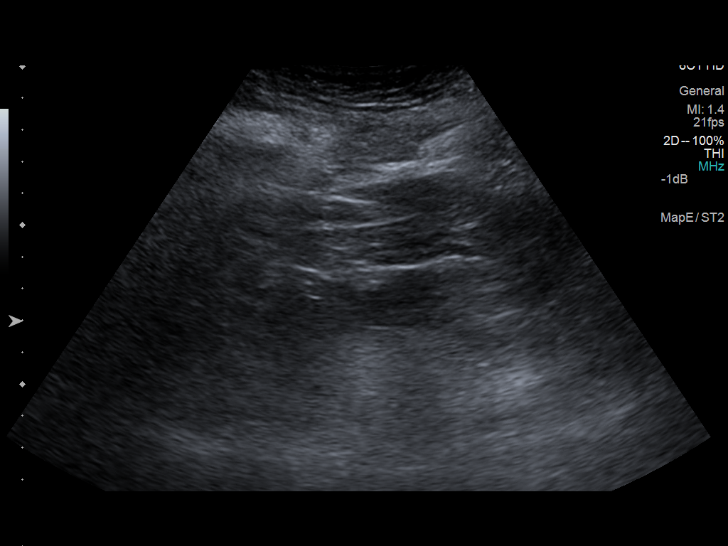
[im 21/39]
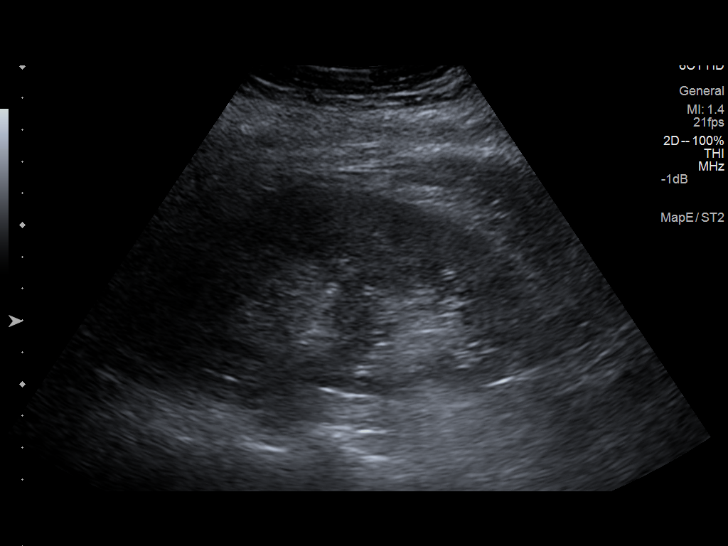
[im 24/39]
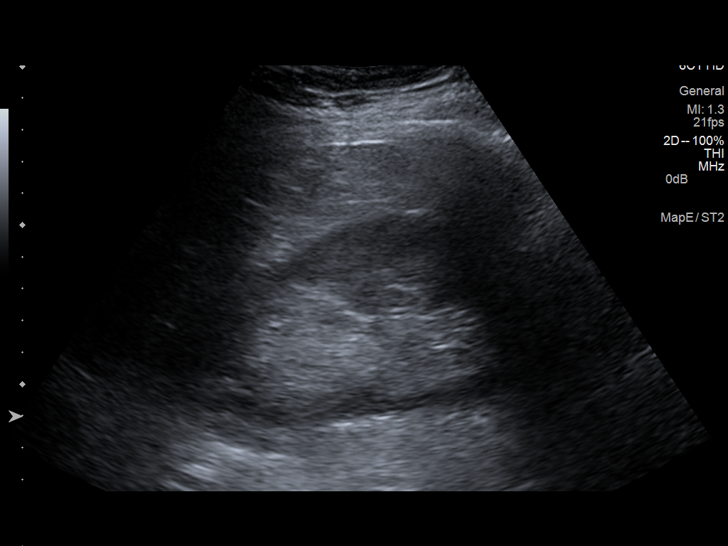
[im 26/39]
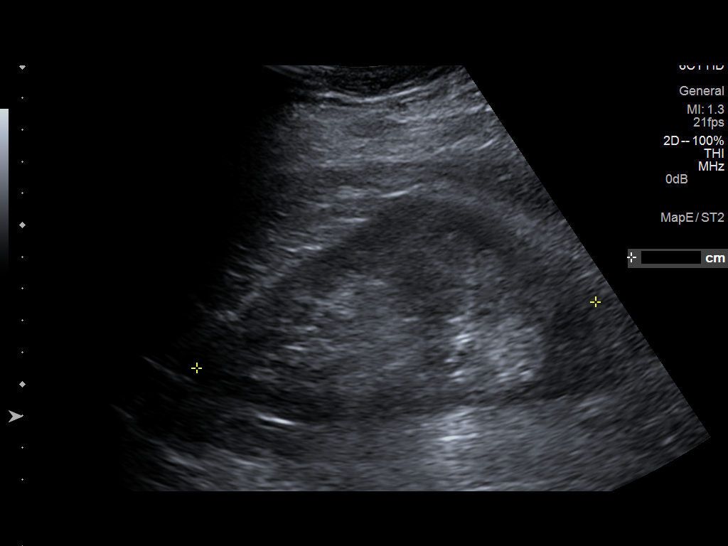
[im 29/39]
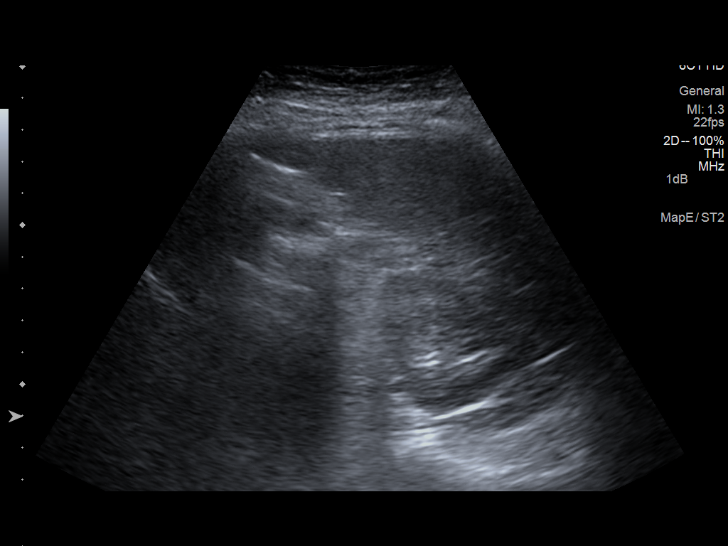
[im 32/39]
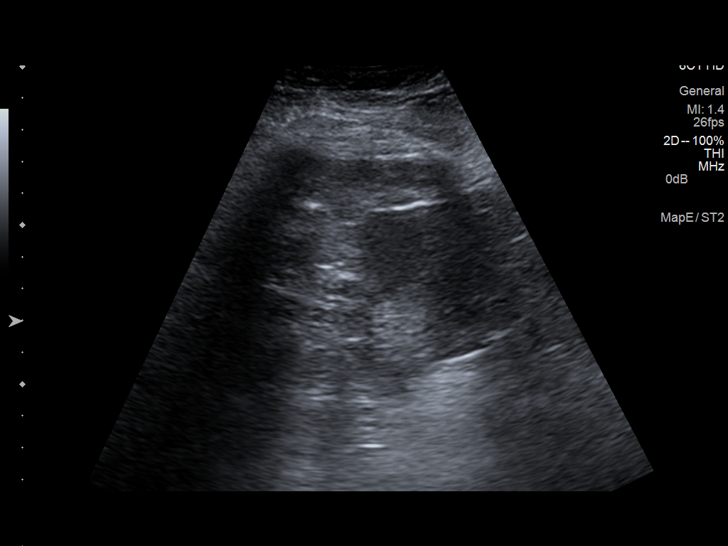
[im 35/39]
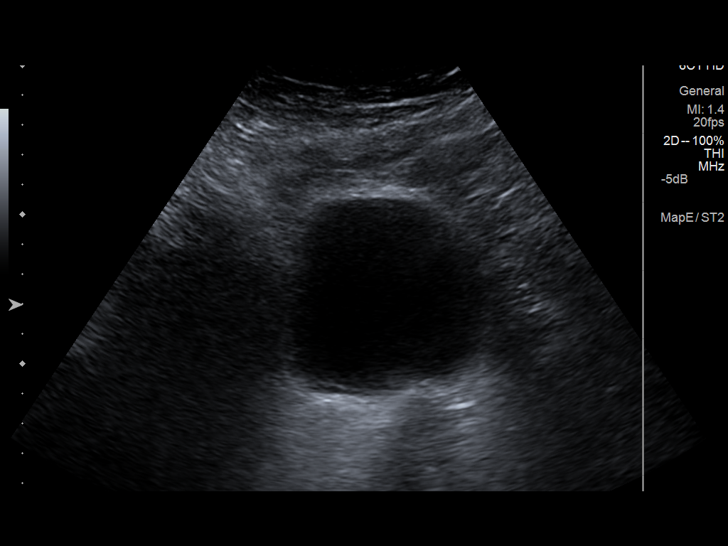
[im 39/39]
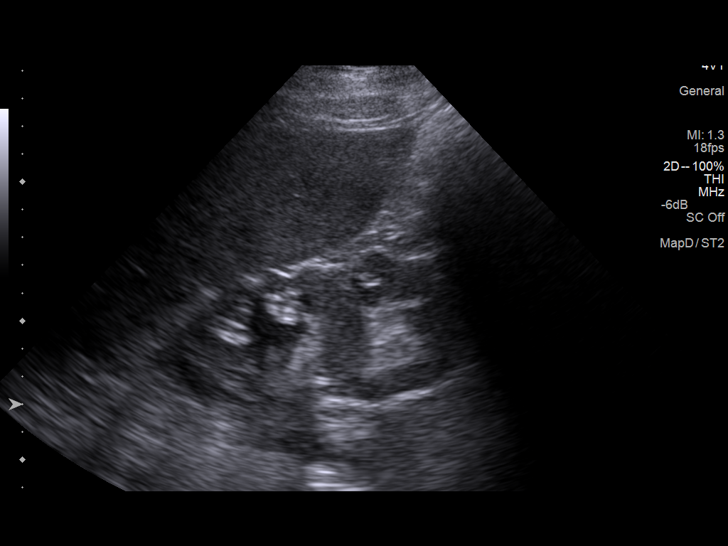

[14 of 25 positions shown; findings below may reference images not displayed]

FINDINGS: Right Kidney:

Length: 13 cm. Echogenicity within normal limits. No mass or
hydronephrosis visualized. Scarring and cortical volume loss from
the upper pole the right kidney is noted. Dilatation of the upper
pole calices is again identified. Echogenic material within the
central pelvis is favored to represent gas. No renal stone noted. No
perinephric fluid collections identified

Left Kidney:

Length: 12.7 cm. Echogenicity within normal limits. No mass or
hydronephrosis visualized.

Bladder:

Appears normal for degree of bladder distention.
IMPRESSION: No perinephric fluid collections identified to suggest hematoma or
urinoma status post nephrolithotomy.

Persistent dilatation of the upper pole caliectasis of the right
kidney and with associated cortical volume loss from the upper pole
likely due to chronic obstruction.

Normal appearing left kidney.

## 2015-04-11 ENCOUNTER — Other Ambulatory Visit: Payer: Self-pay | Admitting: *Deleted

## 2015-04-11 MED ORDER — LORAZEPAM 1 MG PO TABS: 1.0000 mg | ORAL_TABLET | Freq: Every evening | ORAL | Status: DC | PRN

## 2015-04-11 NOTE — Telephone Encounter (Signed)
Rx faxed

## 2015-04-11 NOTE — Telephone Encounter (Signed)
RF request for lorazepam LOV: 11/29/14 Next ov: 05/30/15 Last written: 09/08/14 #30 w/ 5RF  Please advise. Thanks.

## 2015-05-23 LAB — HM DIABETES EYE EXAM

## 2015-05-30 ENCOUNTER — Ambulatory Visit: Payer: No Typology Code available for payment source | Admitting: Family Medicine

## 2015-06-07 ENCOUNTER — Ambulatory Visit (HOSPITAL_COMMUNITY)
Admission: RE | Admit: 2015-06-07 | Discharge: 2015-06-07 | Disposition: A | Discharge status: Home / Self Care | Payer: BLUE CROSS/BLUE SHIELD | Source: Ambulatory Visit | Attending: Cardiology | Admitting: Cardiology

## 2015-06-07 ENCOUNTER — Other Ambulatory Visit (HOSPITAL_COMMUNITY): Payer: Self-pay | Admitting: Orthopedic Surgery

## 2015-06-07 DIAGNOSIS — M25461 Effusion, right knee: Secondary | ICD-10-CM

## 2015-06-07 DIAGNOSIS — M25561 Pain in right knee: Secondary | ICD-10-CM

## 2015-06-07 DIAGNOSIS — I10 Essential (primary) hypertension: Secondary | ICD-10-CM | POA: Insufficient documentation

## 2015-06-18 ENCOUNTER — Encounter: Payer: Self-pay | Admitting: Family Medicine

## 2015-06-25 ENCOUNTER — Telehealth: Payer: Self-pay | Admitting: *Deleted

## 2015-06-25 MED ORDER — IRBESARTAN 300 MG PO TABS: 300.0000 mg | ORAL_TABLET | Freq: Every day | ORAL | Status: DC

## 2015-06-25 NOTE — Telephone Encounter (Signed)
RF request for irbesartan LOV: 11/29/14 Next ov: None Last written: 12/13/14 #30 w/ 6RF  Rx sent for #30 w/ 0Rf. Pt is over due for f/u [facility], needs to schedule office visit for more refills.

## 2015-06-27 NOTE — Telephone Encounter (Signed)
Pt advised and voiced understanding.  She stated that she will call back to schedule apt.

## 2015-06-27 NOTE — Telephone Encounter (Signed)
Left message for pt to call back  °

## 2015-07-05 ENCOUNTER — Ambulatory Visit: Payer: BLUE CROSS/BLUE SHIELD | Admitting: Physical Therapy

## 2015-07-11 ENCOUNTER — Ambulatory Visit: Payer: BLUE CROSS/BLUE SHIELD | Admitting: Family Medicine

## 2015-07-16 ENCOUNTER — Encounter (HOSPITAL_BASED_OUTPATIENT_CLINIC_OR_DEPARTMENT_OTHER): Payer: Self-pay | Admitting: *Deleted

## 2015-07-16 ENCOUNTER — Emergency Department (HOSPITAL_BASED_OUTPATIENT_CLINIC_OR_DEPARTMENT_OTHER)
Admission: EM | Admit: 2015-07-16 | Discharge: 2015-07-16 | Disposition: A | Discharge status: Home / Self Care | Payer: BLUE CROSS/BLUE SHIELD | Attending: Emergency Medicine | Admitting: Emergency Medicine

## 2015-07-16 ENCOUNTER — Emergency Department (HOSPITAL_BASED_OUTPATIENT_CLINIC_OR_DEPARTMENT_OTHER): Payer: BLUE CROSS/BLUE SHIELD

## 2015-07-16 DIAGNOSIS — Z87891 Personal history of nicotine dependence: Secondary | ICD-10-CM | POA: Insufficient documentation

## 2015-07-16 DIAGNOSIS — E662 Morbid (severe) obesity with alveolar hypoventilation: Secondary | ICD-10-CM | POA: Diagnosis not present

## 2015-07-16 DIAGNOSIS — Z79899 Other long term (current) drug therapy: Secondary | ICD-10-CM | POA: Insufficient documentation

## 2015-07-16 DIAGNOSIS — M1990 Unspecified osteoarthritis, unspecified site: Secondary | ICD-10-CM | POA: Diagnosis not present

## 2015-07-16 DIAGNOSIS — E039 Hypothyroidism, unspecified: Secondary | ICD-10-CM | POA: Diagnosis not present

## 2015-07-16 DIAGNOSIS — R011 Cardiac murmur, unspecified: Secondary | ICD-10-CM | POA: Insufficient documentation

## 2015-07-16 DIAGNOSIS — Z7982 Long term (current) use of aspirin: Secondary | ICD-10-CM | POA: Diagnosis not present

## 2015-07-16 DIAGNOSIS — Z8719 Personal history of other diseases of the digestive system: Secondary | ICD-10-CM | POA: Diagnosis not present

## 2015-07-16 DIAGNOSIS — Z86711 Personal history of pulmonary embolism: Secondary | ICD-10-CM | POA: Insufficient documentation

## 2015-07-16 DIAGNOSIS — R0600 Dyspnea, unspecified: Secondary | ICD-10-CM | POA: Insufficient documentation

## 2015-07-16 DIAGNOSIS — E119 Type 2 diabetes mellitus without complications: Secondary | ICD-10-CM | POA: Diagnosis not present

## 2015-07-16 DIAGNOSIS — Z7984 Long term (current) use of oral hypoglycemic drugs: Secondary | ICD-10-CM | POA: Diagnosis not present

## 2015-07-16 DIAGNOSIS — Z87442 Personal history of urinary calculi: Secondary | ICD-10-CM | POA: Insufficient documentation

## 2015-07-16 DIAGNOSIS — Z7951 Long term (current) use of inhaled steroids: Secondary | ICD-10-CM | POA: Insufficient documentation

## 2015-07-16 DIAGNOSIS — Z8669 Personal history of other diseases of the nervous system and sense organs: Secondary | ICD-10-CM | POA: Diagnosis not present

## 2015-07-16 DIAGNOSIS — I10 Essential (primary) hypertension: Secondary | ICD-10-CM | POA: Insufficient documentation

## 2015-07-16 DIAGNOSIS — R0602 Shortness of breath: Secondary | ICD-10-CM | POA: Diagnosis present

## 2015-07-16 HISTORY — DX: Unspecified asthma, uncomplicated: J45.909

## 2015-07-16 LAB — COMPREHENSIVE METABOLIC PANEL
ALT: 26 U/L (ref 14–54)
ANION GAP: 9 (ref 5–15)
AST: 26 U/L (ref 15–41)
Albumin: 4.2 g/dL (ref 3.5–5.0)
Alkaline Phosphatase: 107 U/L (ref 38–126)
BILIRUBIN TOTAL: 0.5 mg/dL (ref 0.3–1.2)
BUN: 15 mg/dL (ref 6–20)
CO2: 28 mmol/L (ref 22–32)
Calcium: 9.5 mg/dL (ref 8.9–10.3)
Chloride: 101 mmol/L (ref 101–111)
Creatinine, Ser: 0.52 mg/dL (ref 0.44–1.00)
GFR calc Af Amer: >60 mL/min (ref >60)
Glucose, Bld: 126 mg/dL — ABNORMAL HIGH (ref 65–99)
Potassium: 4.2 mmol/L (ref 3.5–5.1)
Sodium: 138 mmol/L (ref 135–145)
TOTAL PROTEIN: 7.1 g/dL (ref 6.5–8.1)

## 2015-07-16 LAB — URINALYSIS, ROUTINE W REFLEX MICROSCOPIC
BILIRUBIN URINE: NEGATIVE
GLUCOSE, UA: NEGATIVE mg/dL
Hgb urine dipstick: NEGATIVE
Ketones, ur: NEGATIVE mg/dL
Leukocytes, UA: NEGATIVE
NITRITE: NEGATIVE
PH: 7.5 (ref 5.0–8.0)
Protein, ur: NEGATIVE mg/dL
Specific Gravity, Urine: 1.01 (ref 1.005–1.030)

## 2015-07-16 LAB — BRAIN NATRIURETIC PEPTIDE: B NATRIURETIC PEPTIDE 5: 20.3 pg/mL (ref 0.0–100.0)

## 2015-07-16 LAB — CBC WITH DIFFERENTIAL/PLATELET
BASOS ABS: 0 10*3/uL (ref 0.0–0.1)
BASOS PCT: 1 %
EOS PCT: 5 %
Eosinophils Absolute: 0.3 10*3/uL (ref 0.0–0.7)
HEMATOCRIT: 37.7 % (ref 36.0–46.0)
Hemoglobin: 12.1 g/dL (ref 12.0–15.0)
Lymphocytes Relative: 21 %
Lymphs Abs: 1.4 10*3/uL (ref 0.7–4.0)
MCH: 30.8 pg (ref 26.0–34.0)
MCHC: 32.1 g/dL (ref 30.0–36.0)
MCV: 95.9 fL (ref 78.0–100.0)
Monocytes Absolute: 0.6 10*3/uL (ref 0.1–1.0)
Monocytes Relative: 9 %
NEUTROS ABS: 4.5 10*3/uL (ref 1.7–7.7)
Neutrophils Relative %: 64 %
PLATELETS: 306 10*3/uL (ref 150–400)
RBC: 3.93 MIL/uL (ref 3.87–5.11)
RDW: 13.2 % (ref 11.5–15.5)
WBC: 6.9 10*3/uL (ref 4.0–10.5)

## 2015-07-16 LAB — TROPONIN I: Troponin I: <0.03 ng/mL (ref <0.031)

## 2015-07-16 MED ORDER — IOPAMIDOL (ISOVUE-370) INJECTION 76%
100.0000 mL | Freq: Once | INTRAVENOUS | Status: AC | PRN
Start: 2015-07-16 — End: 2015-07-16
  Administered 2015-07-16: 100 mL via INTRAVENOUS

## 2015-07-16 NOTE — ED Notes (Signed)
Upon ambulation back to room, pt assisted back into bed x 2 max assist. pt instructs her husband "hand me my purse, my legs hurt!" pt instructed not to take any of her medications while here in the ER unless approved by Dr. Stark Jock. Pt and husband verbalize understanding.

## 2015-07-16 NOTE — ED Provider Notes (Signed)
CSN: [id number]     Arrival date & time 07/16/15  [id number] History   First MD Initiated Contact with Patient 07/16/15 [phone]     Chief Complaint  Patient presents with  . Shortness of Breath     (Consider location/radiation/quality/duration/timing/severity/associated sxs/prior Treatment) HPI Comments: Patient is a 60 year old female with history of morbid obesity, hypertension, fibromyalgia, pulmonary embolism, and chronic pain. She presents for evaluation of shortness of breath. She states this has been worsening over the past 3 weeks. It occurs primarily at night when she attempts to lie flat and sleep. She denies any fevers or chills. She denies any cough. She does report some discomfort in her right shoulder, however denies any chest pain. She was recently evaluated by her primary Dr. and underwent Doppler studies of both legs which were negative for DVT.  Patient is a 60 y.o. female presenting with shortness of breath. The history is provided by the patient.  Shortness of Breath Severity:  Moderate Onset quality:  Gradual Duration:  3 weeks Timing:  Intermittent Progression:  Worsening Chronicity:  New Context comment:  Lying flat, attempting to sleep Relieved by:  Nothing Worsened by:  Nothing tried Ineffective treatments:  None tried Associated symptoms: no chest pain, no cough and no fever     Past Medical History  Diagnosis Date  . Insulin resistance   . Hypertension   . Hypothyroidism   . 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 DDD (degenerative disc disease)     Dr. [name] evaluated her and recommended pain mgmt MD.  She saw Dr. Selinda Orion for pain mgmt at one time.  Marland Kitchen GERD (gastroesophageal reflux disease)   . Insomnia   . Morbid obesity (HCC)     BMI 49.  Gastric stapling surgery in distant past.  . Peripheral neuropathy ([place])     No old records to confirm pt's report.  Pt refuses to have more NCS b/c test is painful.  . Fibromyalgia     questionable   . Nephrolithiasis     Lithotripsy 2002.  Right percut nephrolith 05/2013.  Marland Kitchen Disequilibrium syndrome     MRI brain normal 2012  . DIZZINESS 04/12/2010    Qualifier: Diagnosis of  By: Anitra Lauth M.D., Brien Few   . Carpal tunnel syndrome on both sides 06/21/2010  . Mixed incontinence urge and stress     and nocturia x 5-6 per night  . Microscopic hematuria 2014    CT abd pelv showed 1.8 cm right renal pelvis stone.  Also had UA c/w UTI so abx given.   . Shoulder pain     HX OF BILATERAL SHOULDER SURGERIES - PT HAS VERY LIMITED ROM - ESPECIALLY RAISING HER ARMS  . Complication of anesthesia     STATES SHE WAS TOLD SHE WOKE UP DURING HER KNEE REPLACEMENT SURGERY AND HAD TO BE GIVEN MORE MEDICINE - NOT SURE IF SPINAL OR GENERAL ANESTHESIA  . Chronic venous insufficiency     with edema  and extensive varicose dz: Dr. Linus Mako is managing this, planning laser procedures.; A LOT OF LEG CRAMPS AND LEG STIFFNESS  . Heart murmur     functional, w/u done; PALPITATIONS IN THE PAST  . Sleep apnea     PT DOES NOT HAVE MASK OR TUBING - JUST SLEEPS WITH HEAD ELEVATED ON 2 PILLOWS   . History of pulmonary embolism 05/2013    Right sided.  Setting: post-op percutaneous nephrolithotomy--xarelto x 6 mo  .  Superficial thrombophlebitis of left leg 05/2013    with cellulitis--resolved approp with abx and ice  . Spinal stenosis     with L5 radiculopathy, also pseudospondylolisthesis per Dr. [name]  . Obesity hypoventilation syndrome ([place])   . Shortness of breath     CHRONIC; 3 mo trial off of ACE-I made no difference.  Spirometry x 2 has shown no obstruction.  Allergy w/u NEG.  . Chronic nonallergic rhinitis     allergy eval NEG 10/2014: azelastine and flonase spray rx'd  . OAB (overactive bladder)     Per Dr. Jeffie Pollock: pt declined pelvic floor PT.  Vesicare trial started 01/25/15.  . Kidney stone on right side 12/2014    Dr. Jeffie Pollock considering PCNL, ESWL, and ureteroscopy as of 01/25/15   . Diabetes  mellitus without complication [facility])    Past Surgical History  Procedure Laterality Date  . Cholecystectomy  1987  . Joint replacement  2009    Right Knee, [facility]  . Foot surgery  2000    Left tarsel tunnel release  . Hernia repair   1980's    Diaphragmatic + gastric stapling  . Hernia repair      Inguinal  . Extracorporeal shock wave lithotripsy    . Endometrial ablation      menorrhagia  . Bilateral shoulder surgery    . Nephrolithotomy Right 05/31/2013    Procedure: NEPHROLITHOTOMY PERCUTANEOUS;  Surgeon: Irine Seal, MD;  Location: [facility];  Service: Urology;  Laterality: Right;  . Transthoracic echocardiogram  12/16/13    EF [id number], grade 2 diastolic dysfxn, PA pressure 37 (mildly high)  . Pft's  09/2013; 12/2013    09/2013 mild restriction.  12/2013 Low vital capacity, possibly due to restriction from pt's body habitus.  . Gastric restriction surgery  remote past   Family History  Problem Relation Age of Onset  . Hypertension Mother   . Cancer Mother     Brain/Lung/ smoker  . Diabetes Mother   . Stroke Father   . Hypertension Father   . ADD / ADHD Daughter     ADHD  . Thyroid disease Brother   . Hypertension Brother   . Varicose Veins Brother   . Peripheral vascular disease Brother   . Heart attack Maternal Grandfather   . Alcohol abuse Paternal Grandfather    Social History  Substance Use Topics  . Smoking status: Former Smoker -- 1.00 packs/day for 5 years    Types: Cigarettes    Quit date: 03/31/1998  . Smokeless tobacco: Never Used  . Alcohol Use: Yes     Comment: social   OB History    No data available     Review of Systems  Constitutional: Negative for fever.  Respiratory: Positive for shortness of breath. Negative for cough.   Cardiovascular: Negative for chest pain.  All other systems reviewed and are negative.     Allergies  Codeine; Mold extract; and Morphine  Home Medications   Prior to Admission medications   Medication Sig Start  Date End Date Taking? Authorizing Provider  celecoxib (CELEBREX) 100 MG capsule Take 100 mg by mouth 2 (two) times daily.   Yes Historical Provider, MD  acetaminophen (TYLENOL) 500 MG tablet Take 1,000 mg by mouth every 6 (six) hours as needed for mild pain or headache.     Historical Provider, MD  aspirin EC 81 MG tablet Take 81 mg by mouth daily. 12/02/13   Tammi Sou, MD  azelastine (ASTELIN) 0.1 % nasal  spray Place 1-2 sprays into both nostrils 2 (two) times daily as needed. 11/28/14   Historical Provider, MD  benzonatate (TESSALON) 100 MG capsule Take 2 capsules (200 mg total) by mouth 2 (two) times daily. 02/19/15   Renee A Kuneff, DO  diltiazem (CARDIZEM CD) 180 MG 24 hr capsule Take 1 capsule (180 mg total) by mouth daily. Patient not taking: Reported on 02/19/2015 11/15/14   Tammi Sou, MD  doxycycline (VIBRA-TABS) 100 MG tablet Take 1 tablet (100 mg total) by mouth 2 (two) times daily. 02/19/15   Renee A Kuneff, DO  fluticasone (FLONASE) 50 MCG/ACT nasal spray Place 2 sprays into both nostrils daily. 11/28/14   Historical Provider, MD  irbesartan (AVAPRO) 300 MG tablet Take 1 tablet (300 mg total) by mouth daily. 06/25/15   Tammi Sou, MD  levothyroxine (SYNTHROID, LEVOTHROID) 200 MCG tablet TAKE ONE TABLET BY MOUTH ONE TIME DAILY before breakfast 11/07/14   Tammi Sou, MD  LORazepam (ATIVAN) 1 MG tablet Take 1 tablet (1 mg total) by mouth at bedtime as needed. 04/11/15   Tammi Sou, MD  megestrol (MEGACE) 20 MG tablet Take 20 mg by mouth 2 (two) times daily. 02/07/15   Historical Provider, MD  metFORMIN (GLUCOPHAGE) 500 MG tablet Take 1 tablet (500 mg total) by mouth 2 (two) times daily with a meal. 07/21/14   Tammi Sou, MD  oxyCODONE (ROXICODONE) 15 MG immediate release tablet Take 15 mg by mouth 4 (four) times daily. FOR BACK PAIN AND ARTHRITIS AND RT KNEE PAIN    Historical Provider, MD   BP 153/86 mmHg  Pulse 102  Temp(Src) 97.5 F (36.4 C) (Oral)  Resp  24  SpO2 96% Physical Exam  Constitutional: She is oriented to person, place, and time. She appears well-developed and well-nourished. No distress.  HENT:  Head: Normocephalic and atraumatic.  Neck: Normal range of motion. Neck supple.  Cardiovascular: Normal rate and regular rhythm.  Exam reveals no gallop and no friction rub.   No murmur heard. Pulmonary/Chest: Effort normal and breath sounds normal. No respiratory distress. She has no wheezes. She has no rales.  Abdominal: Soft. Bowel sounds are normal. She exhibits no distension. There is no tenderness.  Musculoskeletal: Normal range of motion. She exhibits no edema.  Neurological: She is alert and oriented to person, place, and time.  Skin: Skin is warm and dry. She is not diaphoretic.  Nursing note and vitals reviewed.   ED Course  Procedures (including critical care time) Labs Review Labs Reviewed  URINALYSIS, ROUTINE W REFLEX MICROSCOPIC (NOT AT [facility])  COMPREHENSIVE METABOLIC PANEL  CBC WITH DIFFERENTIAL/PLATELET  BRAIN NATRIURETIC PEPTIDE  TROPONIN I    Imaging Review No results found. I have personally reviewed and evaluated these images and lab results as part of my medical decision-making.   EKG Interpretation   Date/Time:  Monday July 16 2015 07:33:31 EDT Ventricular Rate:  101 PR Interval:  144 QRS Duration: 88 QT Interval:  330 QTC Calculation: 428 R Axis:   7 Text Interpretation:  Sinus tachycardia Abnormal R-wave progression, early  transition Confirmed by Karam Dunson  MD, Ajee Heasley ([id number]) on 07/16/2015 8:08:54 AM  Also confirmed by Stark Jock  MD, Zalman Hull ([id number]), editor Stout CT, Leda Gauze  [phone])  on 07/16/2015 8:30:44 AM      MDM   Final diagnoses:  None    Patient presents with complaints of dyspnea that is worse with lying flat and attempting to sleep. Her workup today reveals no significant  abnormality. Her BNP is normal, troponin is negative, EKG is unchanged, and CT scan reveals no evidence of  pneumothorax, pulmonary embolism, or infiltrate. Her symptoms sound like sleep apnea. She has been told she has had this in the past, however is declined CPAP. The patient is morbidly obese and at risk for this. My instructions for her are to follow-up with her primary Dr. to discuss her situation. I see nothing today that is emergent or requires admission.  Veryl Speak, MD 07/16/15 1017

## 2015-07-16 NOTE — ED Notes (Signed)
MD at bedside. 

## 2015-07-16 NOTE — ED Notes (Addendum)
Pt amb to room 1 with slow, steady gait in nad. Pt reports feeling sob x 3 weeks, worse at night. Pt denies any cp, ekg in progress while pt being triaged. Pt states she has been having to take her oxycodone much more frequently lately, "every 3 hours" due to leg pain. "the celbrex he gave me doesn't work." pt advised to take her medications as prescribed by pain clinic, and not to take more unless approved by her doctor or the pain clinic. Pt states she has been diagnosed with sleep apnea, but has not followed up to obtain her cpap... "I don't want to wear that mask..." pt strongly encouraged to use cpap as prescribed, pt teaching regarding effects on heart of untreated sleep apnea.

## 2015-07-16 NOTE — Discharge Instructions (Signed)
Follow-up with your primary Dr. in the next week for recheck, and return to the ER symptoms significantly worsen or change.   Shortness of Breath Shortness of breath means you have trouble breathing. It could also mean that you have a medical problem. You should get immediate medical care for shortness of breath. CAUSES   Not enough oxygen in the air such as with high altitudes or a smoke-filled room.  Certain lung diseases, infections, or problems.  Heart disease or conditions, such as angina or heart failure.  Low red blood cells (anemia).  Poor physical fitness, which can cause shortness of breath when you exercise.  Chest or back injuries or stiffness.  Being overweight.  Smoking.  Anxiety, which can make you feel like you are not getting enough air. DIAGNOSIS  Serious medical problems can often be found during your physical exam. Tests may also be done to determine why you are having shortness of breath. Tests may include:  Chest X-rays.  Lung function tests.  Blood tests.  An electrocardiogram (ECG).  An ambulatory electrocardiogram. An ambulatory ECG records your heartbeat patterns over a 24-hour period.  Exercise testing.  A transthoracic echocardiogram (TTE). During echocardiography, sound waves are used to evaluate how blood flows through your heart.  A transesophageal echocardiogram (TEE).  Imaging scans. Your health care provider may not be able to find a cause for your shortness of breath after your exam. In this case, it is important to have a follow-up exam with your health care provider as directed.  TREATMENT  Treatment for shortness of breath depends on the cause of your symptoms and can vary greatly. HOME CARE INSTRUCTIONS   Do not smoke. Smoking is a common cause of shortness of breath. If you smoke, ask for help to quit.  Avoid being around chemicals or things that may bother your breathing, such as paint fumes and dust.  Rest as needed.  Slowly resume your usual activities.  If medicines were prescribed, take them as directed for the full length of time directed. This includes oxygen and any inhaled medicines.  Keep all follow-up appointments as directed by your health care provider. SEEK MEDICAL CARE IF:   Your condition does not improve in the time expected.  You have a hard time doing your normal activities even with rest.  You have any new symptoms. SEEK IMMEDIATE MEDICAL CARE IF:   Your shortness of breath gets worse.  You feel light-headed, faint, or develop a cough not controlled with medicines.  You start coughing up blood.  You have pain with breathing.  You have chest pain or pain in your arms, shoulders, or abdomen.  You have a fever.  You are unable to walk up stairs or exercise the way you normally do. MAKE SURE YOU:  Understand these instructions.  Will watch your condition.  Will get help right away if you are not doing well or get worse.   This information is not intended to replace advice given to you by your health care provider. Make sure you discuss any questions you have with your health care provider.   Document Released: 12/10/2000 Document Revised: 03/22/2013 Document Reviewed: 06/02/2011 Elsevier Interactive Patient Education [employer].

## 2015-07-27 ENCOUNTER — Other Ambulatory Visit: Payer: Self-pay | Admitting: *Deleted

## 2015-07-27 MED ORDER — IRBESARTAN 300 MG PO TABS: 300.0000 mg | ORAL_TABLET | Freq: Every day | ORAL | Status: DC

## 2015-07-27 NOTE — Telephone Encounter (Signed)
30d supply with 2 RF's eRx'd. Needs 30 min f/u appt in the next 3 months.-thx

## 2015-07-27 NOTE — Telephone Encounter (Signed)
Pt advised and voiced understanding.  She stated that she will call back to schedule apt for f/u.

## 2015-07-27 NOTE — Telephone Encounter (Signed)
RF request for irbesartan LOV: 11/29/14 Next ov: None Last written: 06/25/14 #30 w/ 0Rf  Pt was advised on 06/27/15 that she needed an ov for more refills. Apt was made for 07/11/15 but was then cancelled. Please advise. Thanks.

## 2015-08-09 ENCOUNTER — Ambulatory Visit: Payer: BLUE CROSS/BLUE SHIELD | Attending: Orthopedic Surgery | Admitting: Physical Therapy

## 2015-08-09 ENCOUNTER — Encounter: Payer: Self-pay | Admitting: Physical Therapy

## 2015-08-09 DIAGNOSIS — R2689 Other abnormalities of gait and mobility: Secondary | ICD-10-CM | POA: Insufficient documentation

## 2015-08-09 DIAGNOSIS — R262 Difficulty in walking, not elsewhere classified: Secondary | ICD-10-CM | POA: Diagnosis present

## 2015-08-09 DIAGNOSIS — M25661 Stiffness of right knee, not elsewhere classified: Secondary | ICD-10-CM | POA: Diagnosis present

## 2015-08-09 DIAGNOSIS — M79604 Pain in right leg: Secondary | ICD-10-CM | POA: Insufficient documentation

## 2015-08-09 NOTE — Therapy (Signed)
[facility] [address]  [place], [place], [address] Phone: [phone]   Fax:  [phone]  Physical Therapy Evaluation  Patient Details  Name: Tricia Perry MRN: [id number] Date of Birth: 10-Sep-1955 Referring Provider: Rod Can PA-C  Encounter Date: 08/09/2015      PT End of Session - 08/09/15 1555    Visit Number 1   Number of Visits 8   Date for PT Re-Evaluation 09/06/15   PT Start Time [id number]   PT Stop Time 1620   PT Time Calculation (min) 42 min      Past Medical History  Diagnosis Date  . Insulin resistance   . Hypertension   . Hypothyroidism   . 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 DDD (degenerative disc disease)     Dr. [name] evaluated her and recommended pain mgmt MD.  She saw Dr. Selinda Orion for pain mgmt at one time.  Marland Kitchen GERD (gastroesophageal reflux disease)   . Insomnia   . Morbid obesity (HCC)     BMI 49.  Gastric stapling surgery in distant past.  . Peripheral neuropathy ([place])     No old records to confirm pt's report.  Pt refuses to have more NCS b/c test is painful.  . Fibromyalgia     questionable  . Nephrolithiasis     Lithotripsy 2002.  Right percut nephrolith 05/2013.  Marland Kitchen Disequilibrium syndrome     MRI brain normal 2012  . DIZZINESS 04/12/2010    Qualifier: Diagnosis of  By: Anitra Lauth M.D., Brien Few   . Carpal tunnel syndrome on both sides 06/21/2010  . Mixed incontinence urge and stress     and nocturia x 5-6 per night  . Microscopic hematuria 2014    CT abd pelv showed 1.8 cm right renal pelvis stone.  Also had UA c/w UTI so abx given.   . Shoulder pain     HX OF BILATERAL SHOULDER SURGERIES - PT HAS VERY LIMITED ROM - ESPECIALLY RAISING HER ARMS  . Complication of anesthesia     STATES SHE WAS TOLD SHE WOKE UP DURING HER KNEE REPLACEMENT SURGERY AND HAD TO BE GIVEN MORE MEDICINE - NOT SURE IF SPINAL OR GENERAL ANESTHESIA  . Chronic venous  insufficiency     with edema  and extensive varicose dz: Dr. Linus Mako is managing this, planning laser procedures.; A LOT OF LEG CRAMPS AND LEG STIFFNESS  . Heart murmur     functional, w/u done; PALPITATIONS IN THE PAST  . Sleep apnea     PT DOES NOT HAVE MASK OR TUBING - JUST SLEEPS WITH HEAD ELEVATED ON 2 PILLOWS   . History of pulmonary embolism 05/2013    Right sided.  Setting: post-op percutaneous nephrolithotomy--xarelto x 6 mo  . Superficial thrombophlebitis of left leg 05/2013    with cellulitis--resolved approp with abx and ice  . Spinal stenosis     with L5 radiculopathy, also pseudospondylolisthesis per Dr. [name]  . Obesity hypoventilation syndrome ([place])   . Shortness of breath     CHRONIC; 3 mo trial off of ACE-I made no difference.  Spirometry x 2 has shown no obstruction.  Allergy w/u NEG.  . Chronic nonallergic rhinitis     allergy eval NEG 10/2014: azelastine and flonase spray rx'd  . OAB (overactive bladder)     Per Dr. Jeffie Pollock: pt declined pelvic floor PT.  Vesicare trial started 01/25/15.  . Kidney  stone on right side 12/2014    Dr. Jeffie Pollock considering PCNL, ESWL, and ureteroscopy as of 01/25/15   . Diabetes mellitus without complication ([place])   . Asthma     Past Surgical History  Procedure Laterality Date  . Cholecystectomy  1987  . Joint replacement  2009    Right Knee, [facility]  . Foot surgery  2000    Left tarsel tunnel release  . Hernia repair   1980's    Diaphragmatic + gastric stapling  . Hernia repair      Inguinal  . Extracorporeal shock wave lithotripsy    . Endometrial ablation      menorrhagia  . Bilateral shoulder surgery    . Nephrolithotomy Right 05/31/2013    Procedure: NEPHROLITHOTOMY PERCUTANEOUS;  Surgeon: Irine Seal, MD;  Location: [facility];  Service: Urology;  Laterality: Right;  . Transthoracic echocardiogram  12/16/13    EF 123456, grade 2 diastolic dysfxn, PA pressure 37 (mildly high)  . Pft's  09/2013; 12/2013    09/2013 mild  restriction.  12/2013 Low vital capacity, possibly due to restriction from pt's body habitus.  . Gastric restriction surgery  remote past    There were no vitals filed for this visit.       Subjective Assessment - 08/09/15 1543    Subjective Pt with c/o R knee pain and limited function of R LE.  She ambulates with use of SPC for past 3 years. She states her pain and level of function have been worsening.   Pertinent History R TKA 2009   Currently in Pain? Yes   Pain Score --  AVG pain 8/10, worst 10/10 which she states happens 2-3x/day   Pain Location Leg   Pain Orientation Right   Pain Radiating Towards c/o pain all over R thigh (front, back, lateral) and states extends into buttock.   Pain Onset More than a month ago   Pain Frequency Intermittent   Aggravating Factors  wt bearing   Pain Relieving Factors being in water, medication, heat            [facility] PT Assessment - 08/09/15 0001    Assessment   Medical Diagnosis R Hamstring Tendinitis   Referring Provider Swinteck, Aaron Edelman PA-C   Balance Screen   Has the patient fallen in the past 6 months No   Has the patient had a decrease in activity level because of a fear of falling?  No   Is the patient reluctant to leave their home because of a fear of falling?  No   Home Environment   Living Environment Private residence   Type of [place] Access Level entry   Home Layout One level   Prior 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; yardwork as able (limited ability)   Observation/Other Assessments   Focus on Therapeutic Outcomes (FOTO)  67% limitation   ROM / Strength   AROM / PROM / Strength AROM        TODAY'S TREATMENT TherEx - NuStep lvl 3, 3' (LE only) Seated HS Stretch (produced burning pain into calf so stopped) Instructed in Seated LAQ and Heel slides for HEP           PT Long Term Goals - 08/09/15 1638    PT LONG TERM GOAL #1   Title pt independent  with HEP as necessary for continued progress by 09/20/15   Status New   PT LONG TERM  GOAL #2   Title pt reports improved ability to stand and walk within her home to allow independent performance of ADLs and chores (to include dressing self) by 09/20/15   Status New   PT LONG TERM GOAL #3   Title pt able to tolerate walking on at least short shopping trips rather than having to use electric cart by 09/20/15   Status New               Plan - 08/09/15 1545    Clinical Impression Statement Ms. Florio sent to [facility] with diagnosis of R Hamstring tendinitis.  She arrives with c/o R LE pain for past several years (had TKA in 2009).  She states pain is "all over" and points to anterior and lateral thigh to knee.  When questioned, she states pain extends from buttock throughout her thigh.  She states she often feels a burning pain in posteiror LE with walking.  She states she has very limited level of function due to R knee pain and limited mobility/strength of R LE.  She states she requires assistance for lower body dressing. She ambulates with use of SPC and states is limited to a few minutes of walking.  She states she has to ride in cart if goes shopping.  Today's assessment finds: seated R knee AROM 10-90 and PROM flexion to 105 (extension AROM difficult to measure due to amount of adipose tissue surrounding knee).  R Knee MMT Ext 4/5 (c/o posterior knee/thigh pain), and Flexion 4+/5 (no pain).  Special Testing includes POS Slump on R LE and this coupled with pt's extensive LE symptoms and PMHx of chronic LBP seems to implicate radicular pain as at least a component of her symptoms. Pt unable to lie supine unless with "5 pillows".  PT treatments will focus on LE strengthening and hip stability exercises as tolerated.   Rehab Potential Fair   Clinical Impairments Affecting Rehab Potential morbidly obese; chronic LE pain; chronic LBP; poor activity tolerance   PT Frequency 2x / week   PT Duration 4 weeks    PT Treatment/Interventions Therapeutic exercise;Gait training;Electrical Stimulation;Balance training;Therapeutic activities;Functional mobility training;Manual techniques;Patient/family education   PT Next Visit Plan NuStep, seated Fitter, other seated exercises as able; may try supine with HOB elevated for some hip stability exercises?   Consulted and Agree with Plan of Care Patient      Patient will benefit from skilled therapeutic intervention in order to improve the following deficits and impairments:  Pain, Decreased strength, Decreased mobility, Abnormal gait, Difficulty walking, Decreased balance, Decreased activity tolerance, Decreased range of motion  Visit Diagnosis: Difficulty in walking, not elsewhere classified - Plan: PT plan of care cert/re-cert  Pain In Right Leg - Plan: PT plan of care cert/re-cert  Stiffness of right knee, not elsewhere classified - Plan: PT plan of care cert/re-cert  Other abnormalities of gait and mobility - Plan: PT plan of care cert/re-cert     Problem List Patient Active Problem List   Diagnosis Date Noted  . Maxillary sinusitis 02/19/2015  . OSA (obstructive sleep apnea) 12/13/2013  . Hypothyroidism 12/04/2013  . Varicose veins of lower extremities with other complications [id number]  . Insomnia 06/13/2013  . Pulmonary embolism ([place]) 05/29/2013  . Shortness of breath 04/20/2013  . Insulin resistance 11/29/2012  . HTN (hypertension) 11/29/2012  . Vitamin B12 deficiency 11/29/2012  . Chronic pain 07/11/2010  . Chronic venous insufficiency 06/21/2010  . B12 DEFICIENCY 05/10/2010  . IRRITABLE BOWEL SYNDROME 05/10/2010  .  MALABSORPTION SYNDROME 05/10/2010  . ESSENTIAL HYPERTENSION 04/12/2010    Nasir Bright PT, OCS 08/09/2015, 4:49 PM  [facility] [address]  [place], [place], [address] Phone: [phone]   Fax:  [phone]  Name: Tricia Perry MRN: [id number] Date  of Birth: 08-20-1955

## 2015-08-15 ENCOUNTER — Ambulatory Visit: Payer: BLUE CROSS/BLUE SHIELD

## 2015-08-15 DIAGNOSIS — R262 Difficulty in walking, not elsewhere classified: Secondary | ICD-10-CM

## 2015-08-15 DIAGNOSIS — M25661 Stiffness of right knee, not elsewhere classified: Secondary | ICD-10-CM

## 2015-08-15 DIAGNOSIS — M79604 Pain in right leg: Secondary | ICD-10-CM

## 2015-08-15 DIAGNOSIS — R2689 Other abnormalities of gait and mobility: Secondary | ICD-10-CM

## 2015-08-15 NOTE — Therapy (Signed)
[facility] [address]  [place], [place], [address] Phone: [phone]   Fax:  [phone]  Physical Therapy Treatment  Patient Details  Name: Tricia Perry MRN: [id number] Date of Birth: [birth year] Referring Provider: Rod Can PA-C  Encounter Date: 08/15/2015      PT End of Session - 08/15/15 1634    Visit Number 2   Number of Visits 8   Date for PT Re-Evaluation 09/06/15   PT Start Time 1628   PT Stop Time 1700   PT Time Calculation (min) 32 min   Activity Tolerance Patient tolerated treatment well   Behavior During Therapy [facility] for tasks assessed/performed      Past Medical History  Diagnosis Date  . Insulin resistance   . Hypertension   . Hypothyroidism   . 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 DDD (degenerative disc disease)     Dr. [name] evaluated her and recommended pain mgmt MD.  She saw Dr. Selinda Orion for pain mgmt at one time.  Marland Kitchen GERD (gastroesophageal reflux disease)   . Insomnia   . Morbid obesity (HCC)     BMI 49.  Gastric stapling surgery in distant past.  . Peripheral neuropathy ([place])     No old records to confirm pt's report.  Pt refuses to have more NCS b/c test is painful.  . Fibromyalgia     questionable  . Nephrolithiasis     Lithotripsy 2002.  Right percut nephrolith 05/2013.  Marland Kitchen Disequilibrium syndrome     MRI brain normal 2012  . DIZZINESS 04/12/2010    Qualifier: Diagnosis of  By: Anitra Lauth M.D., Brien Few   . Carpal tunnel syndrome on both sides 06/21/2010  . Mixed incontinence urge and stress     and nocturia x 5-6 per night  . Microscopic hematuria 2014    CT abd pelv showed 1.8 cm right renal pelvis stone.  Also had UA c/w UTI so abx given.   . Shoulder pain     HX OF BILATERAL SHOULDER SURGERIES - PT HAS VERY LIMITED ROM - ESPECIALLY RAISING HER ARMS  . Complication of anesthesia     STATES SHE WAS TOLD SHE WOKE UP DURING HER KNEE REPLACEMENT  SURGERY AND HAD TO BE GIVEN MORE MEDICINE - NOT SURE IF SPINAL OR GENERAL ANESTHESIA  . Chronic venous insufficiency     with edema  and extensive varicose dz: Dr. Linus Mako is managing this, planning laser procedures.; A LOT OF LEG CRAMPS AND LEG STIFFNESS  . Heart murmur     functional, w/u done; PALPITATIONS IN THE PAST  . Sleep apnea     PT DOES NOT HAVE MASK OR TUBING - JUST SLEEPS WITH HEAD ELEVATED ON 2 PILLOWS   . History of pulmonary embolism 05/2013    Right sided.  Setting: post-op percutaneous nephrolithotomy--xarelto x 6 mo  . Superficial thrombophlebitis of left leg 05/2013    with cellulitis--resolved approp with abx and ice  . Spinal stenosis     with L5 radiculopathy, also pseudospondylolisthesis per Dr. [name]  . Obesity hypoventilation syndrome ([place])   . Shortness of breath     CHRONIC; 3 mo trial off of ACE-I made no difference.  Spirometry x 2 has shown no obstruction.  Allergy w/u NEG.  . Chronic nonallergic rhinitis     allergy eval NEG 10/2014: azelastine and flonase spray rx'd  . OAB (overactive bladder)  Per Dr. Jeffie Pollock: pt declined pelvic floor PT.  Vesicare trial started 01/25/15.  . Kidney stone on right side 12/2014    Dr. Jeffie Pollock considering PCNL, ESWL, and ureteroscopy as of 01/25/15   . Diabetes mellitus without complication ([place])   . Asthma     Past Surgical History  Procedure Laterality Date  . Cholecystectomy  1987  . Joint replacement  2009    Right Knee, [facility]  . Foot surgery  2000    Left tarsel tunnel release  . Hernia repair   1980's    Diaphragmatic + gastric stapling  . Hernia repair      Inguinal  . Extracorporeal shock wave lithotripsy    . Endometrial ablation      menorrhagia  . Bilateral shoulder surgery    . Nephrolithotomy Right 05/31/2013    Procedure: NEPHROLITHOTOMY PERCUTANEOUS;  Surgeon: Irine Seal, MD;  Location: [facility];  Service: Urology;  Laterality: Right;  . Transthoracic echocardiogram  12/16/13    EF  123456, grade 2 diastolic dysfxn, PA pressure 37 (mildly high)  . Pft's  09/2013; 12/2013    09/2013 mild restriction.  12/2013 Low vital capacity, possibly due to restriction from pt's body habitus.  . Gastric restriction surgery  remote past    There were no vitals filed for this visit.      Subjective Assessment - 08/15/15 1631    Subjective Pt. reports 7/10 L knee pain due to flare up of arthritis.  No R knee pain currently.     Currently in Pain? Yes   Pain Score 7    Pain Location Knee   Pain Orientation Left   Pain Descriptors / Indicators Aching   Pain Radiating Towards n/a   Pain Onset More than a month ago   Pain Frequency Intermittent   Aggravating Factors  wt. bearing   Multiple Pain Sites No        TODAY'S TREATMENT:  * pt. arrived late to PT  TherEx: NuStep lvl 7, 3' (LE only) * O2 sats: 93% following NuStep Hooklying LE marching x 10 reps (6 pillows under head/back) Seated HS curl with black TB 2 x 10 reps  Seated B hip abd/ER with big black TB x 10 reps  Seated B single leg hip adduction with big black TB x 10 reps Seated LAQ (no weight) x 10 reps each leg         PT Long Term Goals - 08/15/15 1635    PT LONG TERM GOAL #1   Title pt independent with HEP as necessary for continued progress by 09/20/15   Status On-going   PT LONG TERM GOAL #2   Title pt reports improved ability to stand and walk within her home to allow independent performance of ADLs and chores (to include dressing self) by 09/20/15   Status On-going   PT LONG TERM GOAL #3   Title pt able to tolerate walking on at least short shopping trips rather than having to use electric cart by 09/20/15   Status On-going               Plan - 08/15/15 1801    Clinical Impression Statement Pt. with 3/10 L knee pain initially today pain free in R posterior knee.  Pt. arrived late to PT today and was very limited with therex secondary to shortness of breath and trouble breathing due to asthma;  increased time for rest breaks taken with O2 sats recorded between each activity.  Today's treatment focused on seated hip strengthening activities; Seated H hip abd/ER with black TB sent home with pt. with handout.     PT Treatment/Interventions Therapeutic exercise;Gait training;Electrical Stimulation;Balance training;Therapeutic activities;Functional mobility training;Manual techniques;Patient/family education   PT Next Visit Plan NuStep, seated Fitter, other seated exercises as able; may try supine with HOB elevated for some hip stability exercises?      Patient will benefit from skilled therapeutic intervention in order to improve the following deficits and impairments:  Pain, Decreased strength, Decreased mobility, Abnormal gait, Difficulty walking, Decreased balance, Decreased activity tolerance, Decreased range of motion  Visit Diagnosis: Pain In Right Leg  Stiffness of right knee, not elsewhere classified  Difficulty in walking, not elsewhere classified  Other abnormalities of gait and mobility     Problem List Patient Active Problem List   Diagnosis Date Noted  . Maxillary sinusitis 02/19/2015  . OSA (obstructive sleep apnea) 12/13/2013  . Hypothyroidism 12/04/2013  . Varicose veins of lower extremities with other complications 123XX123  . Insomnia 06/13/2013  . Pulmonary embolism ([place]) 05/29/2013  . Shortness of breath 04/20/2013  . Insulin resistance 11/29/2012  . HTN (hypertension) 11/29/2012  . Vitamin B12 deficiency 11/29/2012  . Chronic pain 07/11/2010  . Chronic venous insufficiency 06/21/2010  . B12 DEFICIENCY 05/10/2010  . IRRITABLE BOWEL SYNDROME 05/10/2010  . MALABSORPTION SYNDROME 05/10/2010  . ESSENTIAL HYPERTENSION 04/12/2010    Bess Harvest, PTA 08/15/2015, 6:10 PM  [facility] [address]  [place], [place], [address] Phone: [phone]   Fax:  [phone]  Name: TAYLER NEEMAN MRN: [id number] Date of Birth: [birth year]

## 2015-08-20 ENCOUNTER — Ambulatory Visit: Payer: BLUE CROSS/BLUE SHIELD | Admitting: Physical Therapy

## 2015-08-20 DIAGNOSIS — M25661 Stiffness of right knee, not elsewhere classified: Secondary | ICD-10-CM

## 2015-08-20 DIAGNOSIS — R262 Difficulty in walking, not elsewhere classified: Secondary | ICD-10-CM

## 2015-08-20 DIAGNOSIS — M79604 Pain in right leg: Secondary | ICD-10-CM

## 2015-08-20 DIAGNOSIS — R2689 Other abnormalities of gait and mobility: Secondary | ICD-10-CM

## 2015-08-20 NOTE — Therapy (Signed)
[facility] [address]  [place], [place], [address] Phone: [phone]   Fax:  [phone]  Physical Therapy Treatment  Patient Details  Name: Tricia Perry MRN: [id number] Date of Birth: 10-Oct-1955 Referring Provider: Rod Can PA-C  Encounter Date: 08/20/2015      PT End of Session - 08/20/15 1456    Visit Number 3   Number of Visits 8   Date for PT Re-Evaluation 09/06/15   PT Start Time 1455   PT Stop Time 1529   PT Time Calculation (min) 34 min      Past Medical History  Diagnosis Date  . Insulin resistance   . Hypertension   . Hypothyroidism   . 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 DDD (degenerative disc disease)     Dr. [name] evaluated her and recommended pain mgmt MD.  She saw Dr. Selinda Orion for pain mgmt at one time.  Marland Kitchen GERD (gastroesophageal reflux disease)   . Insomnia   . Morbid obesity (HCC)     BMI 49.  Gastric stapling surgery in distant past.  . Peripheral neuropathy ([place])     No old records to confirm pt's report.  Pt refuses to have more NCS b/c test is painful.  . Fibromyalgia     questionable  . Nephrolithiasis     Lithotripsy 2002.  Right percut nephrolith 05/2013.  Marland Kitchen Disequilibrium syndrome     MRI brain normal 2012  . DIZZINESS 04/12/2010    Qualifier: Diagnosis of  By: Anitra Lauth M.D., Brien Few   . Carpal tunnel syndrome on both sides 06/21/2010  . Mixed incontinence urge and stress     and nocturia x 5-6 per night  . Microscopic hematuria 2014    CT abd pelv showed 1.8 cm right renal pelvis stone.  Also had UA c/w UTI so abx given.   . Shoulder pain     HX OF BILATERAL SHOULDER SURGERIES - PT HAS VERY LIMITED ROM - ESPECIALLY RAISING HER ARMS  . Complication of anesthesia     STATES SHE WAS TOLD SHE WOKE UP DURING HER KNEE REPLACEMENT SURGERY AND HAD TO BE GIVEN MORE MEDICINE - NOT SURE IF SPINAL OR GENERAL ANESTHESIA  . Chronic venous  insufficiency     with edema  and extensive varicose dz: Dr. Linus Mako is managing this, planning laser procedures.; A LOT OF LEG CRAMPS AND LEG STIFFNESS  . Heart murmur     functional, w/u done; PALPITATIONS IN THE PAST  . Sleep apnea     PT DOES NOT HAVE MASK OR TUBING - JUST SLEEPS WITH HEAD ELEVATED ON 2 PILLOWS   . History of pulmonary embolism 05/2013    Right sided.  Setting: post-op percutaneous nephrolithotomy--xarelto x 6 mo  . Superficial thrombophlebitis of left leg 05/2013    with cellulitis--resolved approp with abx and ice  . Spinal stenosis     with L5 radiculopathy, also pseudospondylolisthesis per Dr. [name]  . Obesity hypoventilation syndrome ([place])   . Shortness of breath     CHRONIC; 3 mo trial off of ACE-I made no difference.  Spirometry x 2 has shown no obstruction.  Allergy w/u NEG.  . Chronic nonallergic rhinitis     allergy eval NEG 10/2014: azelastine and flonase spray rx'd  . OAB (overactive bladder)     Per Dr. Jeffie Pollock: pt declined pelvic floor PT.  Vesicare trial started 01/25/15.  . Kidney  stone on right side 12/2014    Dr. Jeffie Pollock considering PCNL, ESWL, and ureteroscopy as of 01/25/15   . Diabetes mellitus without complication ([place])   . Asthma     Past Surgical History  Procedure Laterality Date  . Cholecystectomy  1987  . Joint replacement  2009    Right Knee, [facility]  . Foot surgery  2000    Left tarsel tunnel release  . Hernia repair   1980's    Diaphragmatic + gastric stapling  . Hernia repair      Inguinal  . Extracorporeal shock wave lithotripsy    . Endometrial ablation      menorrhagia  . Bilateral shoulder surgery    . Nephrolithotomy Right 05/31/2013    Procedure: NEPHROLITHOTOMY PERCUTANEOUS;  Surgeon: Irine Seal, MD;  Location: [facility];  Service: Urology;  Laterality: Right;  . Transthoracic echocardiogram  12/16/13    EF 123456, grade 2 diastolic dysfxn, PA pressure 37 (mildly high)  . Pft's  09/2013; 12/2013    09/2013 mild  restriction.  12/2013 Low vital capacity, possibly due to restriction from pt's body habitus.  . Gastric restriction surgery  remote past    There were no vitals filed for this visit.      Subjective Assessment - 08/20/15 1456    Subjective States feels as though R knee is improving but L knee still quite painful.  States no pain currently but noted pain in B knees earlier this AM.  Is contemplating getting a shot in L knee due to past benefit with this.   Currently in Pain? Yes   Pain Score --  9/10 earlier today, currently 0/10   Pain Location Knee   Pain Orientation Left            TODAY'S TREATMENT TherEx - Seated Fitter Leg Press 2 Blue 20x each, 1 Black + 1 Blue 15x R, 12x L (fewer on L due to c/o "tension back of knee") Seated Knee Flexion with Black TB 10x each (notes burning sensation to each leg with this) Seated HS Stretch 3x10-20" each LAQ 0# 10x each, 2# 10x each Seated Hip Flexion 10x each Seated B Hip ABD Black TB 10x Seated Hip ADD Black TB 10x each Standing Hip Ext 10x each with B Hands on Counter Standing Hip ABD 10x each B Hands on Counter                          PT Long Term Goals - 08/15/15 1635    PT LONG TERM GOAL #1   Title pt independent with HEP as necessary for continued progress by 09/20/15   Status On-going   PT LONG TERM GOAL #2   Title pt reports improved ability to stand and walk within her home to allow independent performance of ADLs and chores (to include dressing self) by 09/20/15   Status On-going   PT LONG TERM GOAL #3   Title pt able to tolerate walking on at least short shopping trips rather than having to use electric cart by 09/20/15   Status On-going               Plan - 08/20/15 1512    Clinical Impression Statement pt c/o "burning pain" or a "pulling sensation" with nearly every exercise.  The area of sensation is same as targetted tissue with each exercise so not sure if what she is experiencing  is just normal workout senstation or actual  symptoms. Either way, her activity tolerance is quite limited and prognosis is not improving from [place] at this point.   PT Next Visit Plan NuStep, seated Fitter, other seated and standing exercises as able; supine with HOB elevated for some hip stability exercises as able   Consulted and Agree with Plan of Care Patient      Patient will benefit from skilled therapeutic intervention in order to improve the following deficits and impairments:  Pain, Decreased strength, Decreased mobility, Abnormal gait, Difficulty walking, Decreased balance, Decreased activity tolerance, Decreased range of motion  Visit Diagnosis: Difficulty in walking, not elsewhere classified  Other abnormalities of gait and mobility  Pain In Right Leg  Stiffness of right knee, not elsewhere classified     Problem List Patient Active Problem List   Diagnosis Date Noted  . Maxillary sinusitis 02/19/2015  . OSA (obstructive sleep apnea) 12/13/2013  . Hypothyroidism 12/04/2013  . Varicose veins of lower extremities with other complications 123XX123  . Insomnia 06/13/2013  . Pulmonary embolism ([place]) 05/29/2013  . Shortness of breath 04/20/2013  . Insulin resistance 11/29/2012  . HTN (hypertension) 11/29/2012  . Vitamin B12 deficiency 11/29/2012  . Chronic pain 07/11/2010  . Chronic venous insufficiency 06/21/2010  . B12 DEFICIENCY 05/10/2010  . IRRITABLE BOWEL SYNDROME 05/10/2010  . MALABSORPTION SYNDROME 05/10/2010  . ESSENTIAL HYPERTENSION 04/12/2010    Aubria Vanecek PT, OCS 08/20/2015, 4:45 PM  [facility] [address]  [place], [place], [address] Phone: [phone]   Fax:  [phone]  Name: Tricia Perry MRN: [id number] Date of Birth: 08-26-1955

## 2015-08-23 ENCOUNTER — Ambulatory Visit: Payer: BLUE CROSS/BLUE SHIELD | Admitting: Physical Therapy

## 2015-08-23 DIAGNOSIS — M79604 Pain in right leg: Secondary | ICD-10-CM

## 2015-08-23 DIAGNOSIS — R262 Difficulty in walking, not elsewhere classified: Secondary | ICD-10-CM | POA: Diagnosis not present

## 2015-08-23 DIAGNOSIS — M25661 Stiffness of right knee, not elsewhere classified: Secondary | ICD-10-CM

## 2015-08-23 DIAGNOSIS — R2689 Other abnormalities of gait and mobility: Secondary | ICD-10-CM

## 2015-08-23 NOTE — Therapy (Signed)
[facility] [address]  [place], [place], [address] Phone: [phone]   Fax:  [phone]  Physical Therapy Treatment  Patient Details  Name: Tricia Perry MRN: [id number] Date of Birth: 11/22/1955 Referring Provider: Rod Can PA-C  Encounter Date: 08/23/2015      PT End of Session - 08/23/15 1718    Visit Number 4   Number of Visits 8   Date for PT Re-Evaluation 09/06/15   PT Start Time [id number]   PT Stop Time 1757   PT Time Calculation (min) 42 min      Past Medical History  Diagnosis Date  . Insulin resistance   . Hypertension   . Hypothyroidism   . 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 DDD (degenerative disc disease)     Dr. [name] evaluated her and recommended pain mgmt MD.  She saw Dr. Selinda Orion for pain mgmt at one time.  Marland Kitchen GERD (gastroesophageal reflux disease)   . Insomnia   . Morbid obesity (HCC)     BMI 49.  Gastric stapling surgery in distant past.  . Peripheral neuropathy ([place])     No old records to confirm pt's report.  Pt refuses to have more NCS b/c test is painful.  . Fibromyalgia     questionable  . Nephrolithiasis     Lithotripsy 2002.  Right percut nephrolith 05/2013.  Marland Kitchen Disequilibrium syndrome     MRI brain normal 2012  . DIZZINESS 04/12/2010    Qualifier: Diagnosis of  By: Anitra Lauth M.D., Brien Few   . Carpal tunnel syndrome on both sides 06/21/2010  . Mixed incontinence urge and stress     and nocturia x 5-6 per night  . Microscopic hematuria 2014    CT abd pelv showed 1.8 cm right renal pelvis stone.  Also had UA c/w UTI so abx given.   . Shoulder pain     HX OF BILATERAL SHOULDER SURGERIES - PT HAS VERY LIMITED ROM - ESPECIALLY RAISING HER ARMS  . Complication of anesthesia     STATES SHE WAS TOLD SHE WOKE UP DURING HER KNEE REPLACEMENT SURGERY AND HAD TO BE GIVEN MORE MEDICINE - NOT SURE IF SPINAL OR GENERAL ANESTHESIA  . Chronic venous  insufficiency     with edema  and extensive varicose dz: Dr. Linus Mako is managing this, planning laser procedures.; A LOT OF LEG CRAMPS AND LEG STIFFNESS  . Heart murmur     functional, w/u done; PALPITATIONS IN THE PAST  . Sleep apnea     PT DOES NOT HAVE MASK OR TUBING - JUST SLEEPS WITH HEAD ELEVATED ON 2 PILLOWS   . History of pulmonary embolism 05/2013    Right sided.  Setting: post-op percutaneous nephrolithotomy--xarelto x 6 mo  . Superficial thrombophlebitis of left leg 05/2013    with cellulitis--resolved approp with abx and ice  . Spinal stenosis     with L5 radiculopathy, also pseudospondylolisthesis per Dr. [name]  . Obesity hypoventilation syndrome ([place])   . Shortness of breath     CHRONIC; 3 mo trial off of ACE-I made no difference.  Spirometry x 2 has shown no obstruction.  Allergy w/u NEG.  . Chronic nonallergic rhinitis     allergy eval NEG 10/2014: azelastine and flonase spray rx'd  . OAB (overactive bladder)     Per Dr. Jeffie Pollock: pt declined pelvic floor PT.  Vesicare trial started 01/25/15.  . Kidney  stone on right side 12/2014    Dr. Jeffie Pollock considering PCNL, ESWL, and ureteroscopy as of 01/25/15   . Diabetes mellitus without complication ([place])   . Asthma     Past Surgical History  Procedure Laterality Date  . Cholecystectomy  1987  . Joint replacement  2009    Right Knee, [facility]  . Foot surgery  2000    Left tarsel tunnel release  . Hernia repair   1980's    Diaphragmatic + gastric stapling  . Hernia repair      Inguinal  . Extracorporeal shock wave lithotripsy    . Endometrial ablation      menorrhagia  . Bilateral shoulder surgery    . Nephrolithotomy Right 05/31/2013    Procedure: NEPHROLITHOTOMY PERCUTANEOUS;  Surgeon: Irine Seal, MD;  Location: [facility];  Service: Urology;  Laterality: Right;  . Transthoracic echocardiogram  12/16/13    EF 123456, grade 2 diastolic dysfxn, PA pressure 37 (mildly high)  . Pft's  09/2013; 12/2013    09/2013 mild  restriction.  12/2013 Low vital capacity, possibly due to restriction from pt's body habitus.  . Gastric restriction surgery  remote past    There were no vitals filed for this visit.      Subjective Assessment - 08/23/15 1718    Subjective States R knee seems to be doing well and chief complaint is L knee pain again today.  Rates L knee pain 6/10 currently.   Currently in Pain? Yes   Pain Score 6    Pain Location Knee   Pain Orientation Left         TODAY'S TREATMENT TherEx - NuStep lvl 4, 3' Seated HS Stretch 3x10-20" each Seated Knee Flexion with Black TB 12x each (notes burning sensation to each leg with this) LAQ 3# 2x12 each Seated Hip Flexion 3# 10x each Seated B Hip ABD Black TB 15x Seated Hip ADD Black TB 15x each Seated Fitter Leg Press 1 Black + 1 Blue 20x each (no c/o pain today with this, was limited to 12x with L due to c/o "tension back of knee") Sit->Stand from elevated table 10x without UE assist (performed well without c/o pain) Standing Hip ABD Yellow TB at ankles 12x each B Hands back of chair 8" step toe-tapping 10x each with SPC and Back of Chair assist             PT Long Term Goals - 08/15/15 1635    PT LONG TERM GOAL #1   Title pt independent with HEP as necessary for continued progress by 09/20/15   Status On-going   PT LONG TERM GOAL #2   Title pt reports improved ability to stand and walk within her home to allow independent performance of ADLs and chores (to include dressing self) by 09/20/15   Status On-going   PT LONG TERM GOAL #3   Title pt able to tolerate walking on at least short shopping trips rather than having to use electric cart by 09/20/15   Status On-going               Plan - 08/23/15 1802    Clinical Impression Statement pt stating R LE pain not present again today but states L knee still bothering her.  She rates L knee pain 6/10 at start of treatment.  She was seen 2 days ago for PT treatment and similar  treatment performed today but she performed much better.  No c/o increased pain with any activities  and was able to add some exercises and increase reps or intensity of nearly every other exercise.  Seems to be progressing quite well.   PT Next Visit Plan NuStep, seated Fitter, other seated and standing exercises as able; supine with HOB elevated for some hip stability exercises as able   Consulted and Agree with Plan of Care Patient      Patient will benefit from skilled therapeutic intervention in order to improve the following deficits and impairments:  Pain, Decreased strength, Decreased mobility, Abnormal gait, Difficulty walking, Decreased balance, Decreased activity tolerance, Decreased range of motion  Visit Diagnosis: Difficulty in walking, not elsewhere classified  Other abnormalities of gait and mobility  Pain In Right Leg  Stiffness of right knee, not elsewhere classified     Problem List Patient Active Problem List   Diagnosis Date Noted  . Maxillary sinusitis 02/19/2015  . OSA (obstructive sleep apnea) 12/13/2013  . Hypothyroidism 12/04/2013  . Varicose veins of lower extremities with other complications 123XX123  . Insomnia 06/13/2013  . Pulmonary embolism ([place]) 05/29/2013  . Shortness of breath 04/20/2013  . Insulin resistance 11/29/2012  . HTN (hypertension) 11/29/2012  . Vitamin B12 deficiency 11/29/2012  . Chronic pain 07/11/2010  . Chronic venous insufficiency 06/21/2010  . B12 DEFICIENCY 05/10/2010  . IRRITABLE BOWEL SYNDROME 05/10/2010  . MALABSORPTION SYNDROME 05/10/2010  . ESSENTIAL HYPERTENSION 04/12/2010    Layten Aiken PT, OCS 08/23/2015, 6:06 PM  [facility] [address]  [place], [place], [address] Phone: [phone]   Fax:  [phone]  Name: Tricia Perry MRN: [id number] Date of Birth: Aug 13, 1955

## 2015-08-29 ENCOUNTER — Ambulatory Visit: Payer: BLUE CROSS/BLUE SHIELD | Admitting: Physical Therapy

## 2015-08-29 DIAGNOSIS — M25661 Stiffness of right knee, not elsewhere classified: Secondary | ICD-10-CM

## 2015-08-29 DIAGNOSIS — R262 Difficulty in walking, not elsewhere classified: Secondary | ICD-10-CM

## 2015-08-29 DIAGNOSIS — R2689 Other abnormalities of gait and mobility: Secondary | ICD-10-CM

## 2015-08-29 DIAGNOSIS — M79604 Pain in right leg: Secondary | ICD-10-CM

## 2015-08-29 NOTE — Patient Instructions (Signed)
ABDUCTION: Sitting - Resistance Band (Active)    Sit with feet flat. Lift both legs slightly and, against black resistance band, draw it out to side. Complete _1__ sets of __10_ repetitions. Perform __1-2_ sessions per day.  Copyright  VHI. All rights reserved.    KNEE: Extension, Long Arc Quads - Sitting    Raise leg until knee is straight. _10__ reps per set, _1-2__ sets per day.  Copyright  VHI. All rights reserved.    Hip (Front)    Begin sitting tall, both feet flat on floor. Inhale, then exhale while lifting knee as high as is comfortable, keeping upper body straight and still. Slowly return to starting position. Repeat __10__ times each leg. Do _1___ sets per session. Do _1-2___ sessions per day.  Copyright  VHI. All rights reserved.   Functional Quadriceps: Sit to Stand    Sit on edge of chair, feet flat on floor. Stand upright, extending knees fully. Repeat _10___ times per set. Do __1__ sets per session. Do _1-2___ sessions per day.  http://orth.exer.us/735   Copyright  VHI. All rights reserved.

## 2015-08-29 NOTE — Therapy (Addendum)
[facility] [address]  [place], [place], [address] Phone: [phone]   Fax:  [phone]  Physical Therapy Treatment  Patient Details  Name: Tricia Perry MRN: [id number] Date of Birth: 1955-11-12 Referring Provider: Rod Can PA-C  Encounter Date: 08/29/2015      PT End of Session - 08/29/15 1502    Visit Number 5   PT Start Time 1430   PT Stop Time 1740   PT Time Calculation (min) 28 min   Activity Tolerance Patient tolerated treatment well   Behavior During Therapy [facility] for tasks assessed/performed      Past Medical History  Diagnosis Date  . Insulin resistance   . Hypertension   . Hypothyroidism   . 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 DDD (degenerative disc disease)     Dr. [name] evaluated her and recommended pain mgmt MD.  She saw Dr. Selinda Orion for pain mgmt at one time.  Marland Kitchen GERD (gastroesophageal reflux disease)   . Insomnia   . Morbid obesity (HCC)     BMI 49.  Gastric stapling surgery in distant past.  . Peripheral neuropathy ([place])     No old records to confirm pt's report.  Pt refuses to have more NCS b/c test is painful.  . Fibromyalgia     questionable  . Nephrolithiasis     Lithotripsy 2002.  Right percut nephrolith 05/2013.  Marland Kitchen Disequilibrium syndrome     MRI brain normal 2012  . DIZZINESS 04/12/2010    Qualifier: Diagnosis of  By: Anitra Lauth M.D., Brien Few   . Carpal tunnel syndrome on both sides 06/21/2010  . Mixed incontinence urge and stress     and nocturia x 5-6 per night  . Microscopic hematuria 2014    CT abd pelv showed 1.8 cm right renal pelvis stone.  Also had UA c/w UTI so abx given.   . Shoulder pain     HX OF BILATERAL SHOULDER SURGERIES - PT HAS VERY LIMITED ROM - ESPECIALLY RAISING HER ARMS  . Complication of anesthesia     STATES SHE WAS TOLD SHE WOKE UP DURING HER KNEE REPLACEMENT SURGERY AND HAD TO BE GIVEN MORE MEDICINE - NOT SURE IF  SPINAL OR GENERAL ANESTHESIA  . Chronic venous insufficiency     with edema  and extensive varicose dz: Dr. Linus Mako is managing this, planning laser procedures.; A LOT OF LEG CRAMPS AND LEG STIFFNESS  . Heart murmur     functional, w/u done; PALPITATIONS IN THE PAST  . Sleep apnea     PT DOES NOT HAVE MASK OR TUBING - JUST SLEEPS WITH HEAD ELEVATED ON 2 PILLOWS   . History of pulmonary embolism 05/2013    Right sided.  Setting: post-op percutaneous nephrolithotomy--xarelto x 6 mo  . Superficial thrombophlebitis of left leg 05/2013    with cellulitis--resolved approp with abx and ice  . Spinal stenosis     with L5 radiculopathy, also pseudospondylolisthesis per Dr. [name]  . Obesity hypoventilation syndrome ([place])   . Shortness of breath     CHRONIC; 3 mo trial off of ACE-I made no difference.  Spirometry x 2 has shown no obstruction.  Allergy w/u NEG.  . Chronic nonallergic rhinitis     allergy eval NEG 10/2014: azelastine and flonase spray rx'd  . OAB (overactive bladder)     Per Dr. Jeffie Pollock: pt declined pelvic floor PT.  Vesicare trial started  01/25/15.  . Kidney stone on right side 12/2014    Dr. Jeffie Pollock considering PCNL, ESWL, and ureteroscopy as of 01/25/15   . Diabetes mellitus without complication ([place])   . Asthma     Past Surgical History  Procedure Laterality Date  . Cholecystectomy  1987  . Joint replacement  2009    Right Knee, [facility]  . Foot surgery  2000    Left tarsel tunnel release  . Hernia repair   1980's    Diaphragmatic + gastric stapling  . Hernia repair      Inguinal  . Extracorporeal shock wave lithotripsy    . Endometrial ablation      menorrhagia  . Bilateral shoulder surgery    . Nephrolithotomy Right 05/31/2013    Procedure: NEPHROLITHOTOMY PERCUTANEOUS;  Surgeon: Irine Seal, MD;  Location: [facility];  Service: Urology;  Laterality: Right;  . Transthoracic echocardiogram  12/16/13    EF 09-40%, grade 2 diastolic dysfxn, PA pressure 37 (mildly high)   . Pft's  09/2013; 12/2013    09/2013 mild restriction.  12/2013 Low vital capacity, possibly due to restriction from pt's body habitus.  . Gastric restriction surgery  remote past    There were no vitals filed for this visit.      Subjective Assessment - 08/29/15 1433    Subjective L knee has been really hurting but R knee is feeling ok. "I think I'm going to have to get a shot."  doing her own dressing and bathing (seated) at home - states it hasn't gotten much easier; standing hasn't gotten better.  Wants today to be the last visit due to inability to afford   Pertinent History R TKA 2009   Currently in Pain? Yes   Pain Score 6    Pain Location Knee   Pain Orientation Left   Pain Descriptors / Indicators Aching   Pain Onset More than a month ago   Pain Frequency Intermittent   Aggravating Factors  weight bearing   Pain Relieving Factors being in water, medication, heat            [facility] PT Assessment - 08/29/15 1520    Observation/Other Assessments   Focus on Therapeutic Outcomes (FOTO)  56% limited                     [facility] Adult PT Treatment/Exercise - 08/29/15 1436    Exercises   Exercises Knee/Hip   Knee/Hip Exercises: Aerobic   Nustep L 5 x 6 min; 4 extremities   Knee/Hip Exercises: Seated   Long Arc Quad Both;15 reps   Heel Slides Both;15 reps   Clamshell with TheraBand --  Black x 15   Marching Both;15 reps   Sit to [employer] 10 reps                     PT Long Term Goals - 08/29/15 1503    PT LONG TERM GOAL #1   Title pt independent with HEP as necessary for continued progress by 09/20/15   Status Achieved   PT LONG TERM GOAL #2   Title pt reports improved ability to stand and walk within her home to allow independent performance of ADLs and chores (to include dressing self) by 09/20/15   Status Not Met   PT LONG TERM GOAL #3   Title pt able to tolerate walking on at least short shopping trips rather than having to use electric cart  by 09/20/15  Baseline doesn't use electric cart; uses shopping cart for UE support   Status Partially Met               Plan - 08/29/15 1502    Clinical Impression Statement Pt requested today be last visit due to finances.  Only one LTG met due to limited visits.  Pt reports she doesn't use electric cart when shopping but rather holds onto cart.     PT Next Visit Plan d/c PT today   Consulted and Agree with Plan of Care Patient      Patient will benefit from skilled therapeutic intervention in order to improve the following deficits and impairments:     Visit Diagnosis: Difficulty in walking, not elsewhere classified  Other abnormalities of gait and mobility  Pain In Right Leg  Stiffness of right knee, not elsewhere classified     Problem List Patient Active Problem List   Diagnosis Date Noted  . Maxillary sinusitis 02/19/2015  . OSA (obstructive sleep apnea) 12/13/2013  . Hypothyroidism 12/04/2013  . Varicose veins of lower extremities with other complications 63/84/6659  . Insomnia 06/13/2013  . Pulmonary embolism ([place]) 05/29/2013  . Shortness of breath 04/20/2013  . Insulin resistance 11/29/2012  . HTN (hypertension) 11/29/2012  . Vitamin B12 deficiency 11/29/2012  . Chronic pain 07/11/2010  . Chronic venous insufficiency 06/21/2010  . B12 DEFICIENCY 05/10/2010  . IRRITABLE BOWEL SYNDROME 05/10/2010  . MALABSORPTION SYNDROME 05/10/2010  . ESSENTIAL HYPERTENSION 04/12/2010   Laureen Abrahams, PT, DPT 08/29/2015 3:22 PM  [facility] [address]  [place], [place], [address] Phone: [phone]   Fax:  [phone]  Name: Tricia Perry MRN: [id number] Date of Birth: 07/19/1955          PHYSICAL THERAPY DISCHARGE SUMMARY  Visits from Start of Care: 5  Current functional level related to goals / functional outcomes: See above   Remaining deficits: See above; no significant  changes made due to limited visits   Education / Equipment: HEP  Plan: Patient agrees to discharge.  Patient goals were partially met. Patient is being discharged due to financial reasons.  ?????    Laureen Abrahams, PT, DPT 08/29/2015 3:22 PM  [facility] [place], [place] [address]  [phone] (office) [phone] (fax)

## 2015-08-31 ENCOUNTER — Other Ambulatory Visit: Payer: Self-pay | Admitting: Gynecology

## 2015-08-31 DIAGNOSIS — N6452 Nipple discharge: Secondary | ICD-10-CM

## 2015-08-31 DIAGNOSIS — N644 Mastodynia: Secondary | ICD-10-CM

## 2015-09-06 ENCOUNTER — Other Ambulatory Visit: Payer: Self-pay | Admitting: Gynecology

## 2015-09-06 ENCOUNTER — Ambulatory Visit
Admission: RE | Admit: 2015-09-06 | Discharge: 2015-09-06 | Disposition: A | Discharge status: Home / Self Care | Payer: BLUE CROSS/BLUE SHIELD | Source: Ambulatory Visit | Attending: Gynecology | Admitting: Gynecology

## 2015-09-06 DIAGNOSIS — N6452 Nipple discharge: Secondary | ICD-10-CM

## 2015-09-06 DIAGNOSIS — N6320 Unspecified lump in the left breast, unspecified quadrant: Secondary | ICD-10-CM

## 2015-09-06 DIAGNOSIS — N644 Mastodynia: Secondary | ICD-10-CM

## 2015-09-14 ENCOUNTER — Telehealth: Payer: Self-pay | Admitting: General Practice

## 2015-09-14 ENCOUNTER — Other Ambulatory Visit: Payer: BLUE CROSS/BLUE SHIELD

## 2015-09-14 NOTE — Telephone Encounter (Signed)
Patient requesting callback regarding paperwork for handicap sticker that was shredded.

## 2015-09-14 NOTE — Telephone Encounter (Signed)
Pts husband came to office today to see Dr. Anitra Lauth. He brought with him a Chiropodist form for his wife (pt). I gave this form to Dr. Anitra Lauth who signed the form. When I pulled up pts chart to document her MRN on the file copy I saw that she transferred to [facility] at the end of April this year. I advised Dr. Anitra Lauth who then stated that form will need to be shredded and pt can be given a new blank form to take to her new PCP. I advised pts husband and he voiced understanding. Form was placed in shred box to be sent to shred. Pt called back requesting a call in regards to this. I called pt and as soon as she answered the phone she was yelling so loud that Dr. Raoul Pitch, Vinnie Level and Dr. Anitra Lauth could hear her through my headset. She was yelling that I had no right to tare up her paperwork and that it took her too long to get this form. I tried to explain to pt that the form had not been shredded but she kept yelling over me stating that "she does not like me and has never liked me since I started working here". She stated that "she was going to report me", I offered to let her speak to our office manager Estill Bamberg which pt refused. She did finally allow me a chance to say that the form had not yet been shredded but has been voided since Dr.McGowen had signed it before either of [place] realized that she had transferred. We did give her husband a blank [place]ing Placard for pt to take to her new PCP but she stated that would not work. I apologized for the trouble and offered to put the form up front for her to p/u (form had been X through and note made stating no longer a [facility] pt) but she stated that since he signed the form she could not have her new PCP sign. She was still yelling about how much trouble this caused for her and then hung up the phone. Curator was notified of this encounter).

## 2015-09-18 ENCOUNTER — Ambulatory Visit
Admission: RE | Admit: 2015-09-18 | Discharge: 2015-09-18 | Disposition: A | Discharge status: Home / Self Care | Payer: BLUE CROSS/BLUE SHIELD | Source: Ambulatory Visit | Attending: Gynecology | Admitting: Gynecology

## 2015-09-18 ENCOUNTER — Other Ambulatory Visit: Payer: Self-pay | Admitting: Gynecology

## 2015-09-18 DIAGNOSIS — N6320 Unspecified lump in the left breast, unspecified quadrant: Secondary | ICD-10-CM

## 2015-10-03 ENCOUNTER — Other Ambulatory Visit: Payer: Self-pay | Admitting: General Surgery

## 2015-10-03 DIAGNOSIS — D242 Benign neoplasm of left breast: Secondary | ICD-10-CM

## 2015-10-04 ENCOUNTER — Other Ambulatory Visit: Payer: Self-pay | Admitting: General Surgery

## 2015-10-04 DIAGNOSIS — D242 Benign neoplasm of left breast: Secondary | ICD-10-CM

## 2015-10-22 ENCOUNTER — Other Ambulatory Visit: Payer: Self-pay | Admitting: General Surgery

## 2015-10-22 DIAGNOSIS — D242 Benign neoplasm of left breast: Secondary | ICD-10-CM

## 2015-11-05 ENCOUNTER — Encounter (HOSPITAL_COMMUNITY): Payer: Self-pay

## 2015-11-05 NOTE — Pre-Procedure Instructions (Signed)
Tricia Perry  11/05/2015      [facility] [place], [place] - [id number] [place] [address] Phone: [phone] Fax: [place], [place] [id number] [place] [address] Phone: [phone] Fax: [phone]  [facility] [place], [place] - [address] [place] [address] Phone: [phone] Fax: [phone]  [facility] [place], [place] - [place] [address] Phone: [phone] Fax: [phone]  [facility] [phone] - [place], [place] - [address] AT [place] [address] [place] [address] Phone: [phone] Fax: [phone]    Your procedure is scheduled on Monday, August 14.  Report to [facility] Admitting at 8:45 A.M.   Call this number if you have problems the morning of surgery:  [phone]   Remember:  Do not eat food or drink liquids after midnight.   Take these medicines the morning of surgery with A SIP OF WATER: acetaminophen (tylenol), levothyroxine (synthroid), oxycodone if needed, Proair if needed (please bring inhaler to hospital day of surgery), flonase if needed  7 days prior to surgery STOP taking any Aspirin, Aleve, Naproxen, Ibuprofen, Motrin, Advil, Goody's, BC's, all herbal medications, fish oil, and all vitamins   WHAT DO I DO ABOUT MY DIABETES MEDICATION?   Marland Kitchen Do not take oral diabetes medicines (pills) the morning of surgery. DO NOT take Metformin (Glucophage) the day of surgery    How to Manage Your Diabetes Before and After Surgery  Why is it important to control my blood sugar before and after surgery? . Improving blood sugar levels before and after surgery helps healing and can limit problems. . A way of improving blood sugar control is eating a  healthy diet by: o  Eating less sugar and carbohydrates o  Increasing activity/exercise o  Talking with your doctor about reaching your blood sugar goals . High blood sugars (greater than 180 mg/dL) can raise your risk of infections and slow your recovery, so you will need to focus on controlling your diabetes during the weeks before surgery. . Make sure that the doctor who takes care of your diabetes knows about your planned surgery including the date and location.  How do I manage my blood sugar before surgery? . Check your blood sugar at least 4 times a day, starting 2 days before surgery, to make sure that the level is not too high or low. o Check your blood sugar the morning of your surgery when you wake up and every 2 hours until you get to the Short Stay unit. . If your blood sugar is less than 70 mg/dL, you will need to treat for low blood sugar: o Do not take insulin. o Treat a low blood sugar (less than 70 mg/dL) with  cup of clear juice (cranberry or apple), 4 glucose tablets, OR glucose gel. o Recheck blood sugar in 15 minutes after treatment (to make sure it is greater than 70 mg/dL). If your blood sugar is not greater than 70 mg/dL on recheck, call [phone] for further instructions. . Report your blood sugar to the short stay nurse when you get to Short Stay.  . If you are admitted to the hospital after surgery: o Your blood  sugar will be checked by the staff and you will probably be given insulin after surgery (instead of oral diabetes medicines) to make sure you have good blood sugar levels. o The goal for blood sugar control after surgery is 80-180 mg/dL.   Do not wear jewelry, make-up or nail polish.  Do not wear lotions, powders, or perfumes.  You may NOT wear deoderant.  Do not shave 48 hours prior to surgery.    Do not bring valuables to the hospital.  [facility] is not responsible for any belongings or valuables.  Contacts, dentures or bridgework may not be worn  into surgery.  Leave your suitcase in the car.  After surgery it may be brought to your room.  For patients admitted to the hospital, discharge time will be determined by your treatment team.  Patients discharged the day of surgery will not be allowed to drive home.    Special instructions:    [name]- Preparing For Surgery  Before surgery, you can play an important role. Because skin is not sterile, your skin needs to be as free of germs as possible. You can reduce the number of germs on your skin by washing with CHG (chlorahexidine gluconate) Soap before surgery.  CHG is an antiseptic cleaner which kills germs and bonds with the skin to continue killing germs even after washing.  Please do not use if you have an allergy to CHG or antibacterial soaps. If your skin becomes reddened/irritated stop using the CHG.  Do not shave (including legs and underarms) for at least 48 hours prior to first CHG shower. It is OK to shave your face.  Please follow these instructions carefully.   1. Shower the NIGHT BEFORE SURGERY and the MORNING OF SURGERY with CHG.   2. If you chose to wash your hair, wash your hair first as usual with your normal shampoo.  3. After you shampoo, rinse your hair and body thoroughly to remove the shampoo.  4. Use CHG as you would any other liquid soap. You can apply CHG directly to the skin and wash gently with a scrungie or a clean washcloth.   5. Apply the CHG Soap to your body ONLY FROM THE NECK DOWN.  Do not use on open wounds or open sores. Avoid contact with your eyes, ears, mouth and genitals (private parts). Wash genitals (private parts) with your normal soap.  6. Wash thoroughly, paying special attention to the area where your surgery will be performed.  7. Thoroughly rinse your body with warm water from the neck down.  8. DO NOT shower/wash with your normal soap after using and rinsing off the CHG Soap.  9. Pat yourself dry with a CLEAN TOWEL.    10. Wear CLEAN PAJAMAS   11. Place CLEAN SHEETS on your bed the night of your first shower and DO NOT SLEEP WITH PETS.  Day of Surgery: Do not apply any deodorants/lotions. Please wear clean clothes to the hospital/surgery center.     Please read over the following fact sheets that you were given.

## 2015-11-06 ENCOUNTER — Encounter (HOSPITAL_COMMUNITY): Payer: Self-pay

## 2015-11-06 ENCOUNTER — Encounter (HOSPITAL_COMMUNITY)
Admission: RE | Admit: 2015-11-06 | Discharge: 2015-11-06 | Disposition: A | Discharge status: Home / Self Care | Payer: BLUE CROSS/BLUE SHIELD | Source: Ambulatory Visit | Attending: General Surgery | Admitting: General Surgery

## 2015-11-06 DIAGNOSIS — E039 Hypothyroidism, unspecified: Secondary | ICD-10-CM | POA: Diagnosis not present

## 2015-11-06 DIAGNOSIS — Z6841 Body Mass Index (BMI) 40.0 and over, adult: Secondary | ICD-10-CM | POA: Insufficient documentation

## 2015-11-06 DIAGNOSIS — D242 Benign neoplasm of left breast: Secondary | ICD-10-CM | POA: Insufficient documentation

## 2015-11-06 DIAGNOSIS — Z79899 Other long term (current) drug therapy: Secondary | ICD-10-CM | POA: Insufficient documentation

## 2015-11-06 DIAGNOSIS — Z7982 Long term (current) use of aspirin: Secondary | ICD-10-CM | POA: Insufficient documentation

## 2015-11-06 DIAGNOSIS — Z87891 Personal history of nicotine dependence: Secondary | ICD-10-CM | POA: Insufficient documentation

## 2015-11-06 DIAGNOSIS — K219 Gastro-esophageal reflux disease without esophagitis: Secondary | ICD-10-CM | POA: Diagnosis not present

## 2015-11-06 DIAGNOSIS — E662 Morbid (severe) obesity with alveolar hypoventilation: Secondary | ICD-10-CM | POA: Insufficient documentation

## 2015-11-06 DIAGNOSIS — I10 Essential (primary) hypertension: Secondary | ICD-10-CM | POA: Insufficient documentation

## 2015-11-06 DIAGNOSIS — Z86711 Personal history of pulmonary embolism: Secondary | ICD-10-CM | POA: Diagnosis not present

## 2015-11-06 DIAGNOSIS — Z01812 Encounter for preprocedural laboratory examination: Secondary | ICD-10-CM | POA: Diagnosis not present

## 2015-11-06 DIAGNOSIS — E119 Type 2 diabetes mellitus without complications: Secondary | ICD-10-CM | POA: Diagnosis not present

## 2015-11-06 DIAGNOSIS — J45909 Unspecified asthma, uncomplicated: Secondary | ICD-10-CM | POA: Diagnosis not present

## 2015-11-06 DIAGNOSIS — Z01818 Encounter for other preprocedural examination: Secondary | ICD-10-CM | POA: Diagnosis not present

## 2015-11-06 DIAGNOSIS — Z7984 Long term (current) use of oral hypoglycemic drugs: Secondary | ICD-10-CM | POA: Insufficient documentation

## 2015-11-06 HISTORY — DX: Nausea with vomiting, unspecified: R11.2

## 2015-11-06 HISTORY — DX: Paresthesia of skin: R20.2

## 2015-11-06 HISTORY — DX: Personal history of other diseases of the respiratory system: Z87.09

## 2015-11-06 HISTORY — DX: Other specified postprocedural states: Z98.890

## 2015-11-06 HISTORY — DX: Personal history of other specified conditions: Z87.898

## 2015-11-06 HISTORY — DX: Paresthesia of skin: R20.0

## 2015-11-06 HISTORY — DX: Nocturia: R35.1

## 2015-11-06 HISTORY — DX: Unspecified urinary incontinence: R32

## 2015-11-06 HISTORY — DX: Dizziness and giddiness: R42

## 2015-11-06 HISTORY — DX: Carpal tunnel syndrome, bilateral upper limbs: G56.03

## 2015-11-06 HISTORY — DX: Urgency of urination: R39.15

## 2015-11-06 LAB — BASIC METABOLIC PANEL
ANION GAP: 10 (ref 5–15)
BUN: 14 mg/dL (ref 6–20)
CALCIUM: 9.8 mg/dL (ref 8.9–10.3)
CO2: 25 mmol/L (ref 22–32)
Chloride: 101 mmol/L (ref 101–111)
Creatinine, Ser: 0.57 mg/dL (ref 0.44–1.00)
GLUCOSE: 102 mg/dL — AB (ref 65–99)
Potassium: 4.6 mmol/L (ref 3.5–5.1)
SODIUM: 136 mmol/L (ref 135–145)

## 2015-11-06 LAB — CBC
HCT: 42.2 % (ref 36.0–46.0)
HEMOGLOBIN: 13.1 g/dL (ref 12.0–15.0)
MCH: 28.8 pg (ref 26.0–34.0)
MCHC: 31 g/dL (ref 30.0–36.0)
MCV: 92.7 fL (ref 78.0–100.0)
Platelets: 289 10*3/uL (ref 150–400)
RBC: 4.55 MIL/uL (ref 3.87–5.11)
RDW: 14 % (ref 11.5–15.5)
WBC: 6.1 10*3/uL (ref 4.0–10.5)

## 2015-11-06 LAB — GLUCOSE, CAPILLARY: GLUCOSE-CAPILLARY: 103 mg/dL — AB (ref 65–99)

## 2015-11-06 NOTE — Progress Notes (Signed)
PCP - Dr. Doug Sou at [place] - denies  EKG - 07/16/15 CXR - denies Echo - 12/16/13 Stress test/Cardiac Cath - denies  Patient denies chest pain and shortness of breath at PAT appointment.

## 2015-11-07 LAB — HEMOGLOBIN A1C
HEMOGLOBIN A1C: 5.9 % — AB (ref 4.8–5.6)
MEAN PLASMA GLUCOSE: 123 mg/dL

## 2015-11-07 NOTE — Progress Notes (Signed)
Anesthesia Chart Review:  Pt is a 60 year old female scheduled for L breast lumpectomy with radioactive seed localization on 11/12/2015 with Autumn Messing III, MD.   Currently receiving care at [facility] (care everywhere). Former patient of Shawnie Dapper, MD.   PMH includes:  HTN, DM, heart murmur, palpitations, OSA, obesity hypoventilation syndrome, asthma, hypothyroidism, PE (2015 s/p nephrolithotomy), post-op N/V, GERD.  Former smoker. BMI 56.   Medications include: ASA, irbesartan, levothyroxine, metformin, albuterol  Preoperative labs reviewed.  HgbA1c 5.9, glucose 102  Chest x-ray 08/04/15 reviewed (care everywhere). No acute abnormalities.    EKG 07/16/15: Sinus tachycardia (101 bpm). Abnormal R-wave progression, early transition  Echo 12/16/13:  - Left ventricle: The cavity size was normal. Wall thickness was  normal. Systolic function was normal. The estimated ejectionfraction was in the range of 60% to 65%. Wall motion was normal; there were no regional wall motion abnormalities. Features areconsistent with a pseudonormal left ventricular filling pattern, with concomitant abnormal relaxation and increased filling pressure (grade 2 diastolic dysfunction). - Left atrium: The atrium was mildly dilated. - Right atrium: The atrium was mildly dilated. - Pulmonary arteries: Systolic pressure was mildly increased. PA peak pressure: 37 mm Hg (S).  If no changes, I anticipate pt can proceed with surgery as scheduled.   Willeen Cass, FNP-BC [facility] Short Stay Surgical Center/Anesthesiology Phone: [phone] 11/07/2015 2:15 PM

## 2015-11-09 ENCOUNTER — Other Ambulatory Visit: Payer: Self-pay | Admitting: General Surgery

## 2015-11-09 ENCOUNTER — Ambulatory Visit
Admission: RE | Admit: 2015-11-09 | Discharge: 2015-11-09 | Disposition: A | Discharge status: Home / Self Care | Payer: BLUE CROSS/BLUE SHIELD | Source: Ambulatory Visit | Attending: General Surgery | Admitting: General Surgery

## 2015-11-09 DIAGNOSIS — D242 Benign neoplasm of left breast: Secondary | ICD-10-CM

## 2015-11-09 MED ORDER — DEXTROSE 5 % IV SOLN
3.0000 g | INTRAVENOUS | Status: AC
  Administered 2015-11-12: 3 g via INTRAVENOUS
  Filled 2015-11-09: qty 3000

## 2015-11-12 ENCOUNTER — Ambulatory Visit
Admission: RE | Admit: 2015-11-12 | Discharge: 2015-11-12 | Disposition: A | Discharge status: Home / Self Care | Payer: BLUE CROSS/BLUE SHIELD | Source: Ambulatory Visit | Attending: General Surgery | Admitting: General Surgery

## 2015-11-12 ENCOUNTER — Ambulatory Visit (HOSPITAL_COMMUNITY): Payer: BLUE CROSS/BLUE SHIELD | Admitting: Certified Registered Nurse Anesthetist

## 2015-11-12 ENCOUNTER — Ambulatory Visit (HOSPITAL_COMMUNITY)
Admission: RE | Admit: 2015-11-12 | Discharge: 2015-11-12 | Disposition: A | Discharge status: Home / Self Care | Payer: BLUE CROSS/BLUE SHIELD | Source: Ambulatory Visit | Attending: General Surgery | Admitting: General Surgery

## 2015-11-12 ENCOUNTER — Encounter (HOSPITAL_COMMUNITY): Payer: Self-pay | Admitting: *Deleted

## 2015-11-12 ENCOUNTER — Ambulatory Visit (HOSPITAL_COMMUNITY): Payer: BLUE CROSS/BLUE SHIELD | Admitting: Emergency Medicine

## 2015-11-12 ENCOUNTER — Encounter (HOSPITAL_COMMUNITY)
Admission: RE | Disposition: A | Discharge status: Home / Self Care | Payer: Self-pay | Source: Ambulatory Visit | Attending: General Surgery

## 2015-11-12 DIAGNOSIS — G4730 Sleep apnea, unspecified: Secondary | ICD-10-CM | POA: Diagnosis not present

## 2015-11-12 DIAGNOSIS — I10 Essential (primary) hypertension: Secondary | ICD-10-CM | POA: Insufficient documentation

## 2015-11-12 DIAGNOSIS — D242 Benign neoplasm of left breast: Secondary | ICD-10-CM

## 2015-11-12 DIAGNOSIS — Z87891 Personal history of nicotine dependence: Secondary | ICD-10-CM | POA: Insufficient documentation

## 2015-11-12 DIAGNOSIS — M797 Fibromyalgia: Secondary | ICD-10-CM | POA: Insufficient documentation

## 2015-11-12 DIAGNOSIS — M1990 Unspecified osteoarthritis, unspecified site: Secondary | ICD-10-CM | POA: Diagnosis not present

## 2015-11-12 DIAGNOSIS — E119 Type 2 diabetes mellitus without complications: Secondary | ICD-10-CM | POA: Diagnosis not present

## 2015-11-12 DIAGNOSIS — Z7984 Long term (current) use of oral hypoglycemic drugs: Secondary | ICD-10-CM | POA: Insufficient documentation

## 2015-11-12 DIAGNOSIS — Z79899 Other long term (current) drug therapy: Secondary | ICD-10-CM | POA: Insufficient documentation

## 2015-11-12 DIAGNOSIS — J45909 Unspecified asthma, uncomplicated: Secondary | ICD-10-CM | POA: Insufficient documentation

## 2015-11-12 DIAGNOSIS — E039 Hypothyroidism, unspecified: Secondary | ICD-10-CM | POA: Insufficient documentation

## 2015-11-12 DIAGNOSIS — Z803 Family history of malignant neoplasm of breast: Secondary | ICD-10-CM | POA: Insufficient documentation

## 2015-11-12 DIAGNOSIS — K219 Gastro-esophageal reflux disease without esophagitis: Secondary | ICD-10-CM | POA: Insufficient documentation

## 2015-11-12 HISTORY — PX: BREAST LUMPECTOMY WITH RADIOACTIVE SEED LOCALIZATION: SHX6424

## 2015-11-12 HISTORY — PX: BREAST EXCISIONAL BIOPSY: SUR124

## 2015-11-12 LAB — GLUCOSE, CAPILLARY
GLUCOSE-CAPILLARY: 116 mg/dL — AB (ref 65–99)
GLUCOSE-CAPILLARY: 109 mg/dL — AB (ref 65–99)

## 2015-11-12 SURGERY — BREAST LUMPECTOMY WITH RADIOACTIVE SEED LOCALIZATION
Anesthesia: General | Site: Breast | Laterality: Left

## 2015-11-12 MED ORDER — BUPIVACAINE-EPINEPHRINE (PF) 0.25% -1:200000 IJ SOLN
INTRAMUSCULAR | Status: AC
  Filled 2015-11-12: qty 30

## 2015-11-12 MED ORDER — LACTATED RINGERS IV SOLN: INTRAVENOUS | Status: DC

## 2015-11-12 MED ORDER — DEXAMETHASONE SODIUM PHOSPHATE 10 MG/ML IJ SOLN
INTRAMUSCULAR | Status: DC | PRN
  Administered 2015-11-12: 8 mg via INTRAVENOUS

## 2015-11-12 MED ORDER — CHLORHEXIDINE GLUCONATE CLOTH 2 % EX PADS: 6.0000 | MEDICATED_PAD | Freq: Once | CUTANEOUS | Status: DC

## 2015-11-12 MED ORDER — FENTANYL CITRATE (PF) 250 MCG/5ML IJ SOLN
INTRAMUSCULAR | Status: AC
  Filled 2015-11-12: qty 5

## 2015-11-12 MED ORDER — PHENYLEPHRINE HCL 10 MG/ML IJ SOLN
INTRAMUSCULAR | Status: DC | PRN
  Administered 2015-11-12 (×2): 40 ug via INTRAVENOUS
  Administered 2015-11-12: 80 ug via INTRAVENOUS

## 2015-11-12 MED ORDER — LACTATED RINGERS IV SOLN
INTRAVENOUS | Status: DC
  Administered 2015-11-12: 50 mL/h via INTRAVENOUS
  Administered 2015-11-12 (×2): via INTRAVENOUS

## 2015-11-12 MED ORDER — MEPERIDINE HCL 25 MG/ML IJ SOLN: 6.2500 mg | INTRAMUSCULAR | Status: DC | PRN

## 2015-11-12 MED ORDER — PROMETHAZINE HCL 25 MG/ML IJ SOLN
6.2500 mg | INTRAMUSCULAR | Status: DC | PRN
Start: 2015-11-12 — End: 2015-11-12

## 2015-11-12 MED ORDER — HYDROMORPHONE HCL 1 MG/ML IJ SOLN
INTRAMUSCULAR | Status: AC
  Filled 2015-11-12: qty 1

## 2015-11-12 MED ORDER — HYDROMORPHONE HCL 1 MG/ML IJ SOLN
0.2500 mg | INTRAMUSCULAR | Status: DC | PRN
  Administered 2015-11-12 (×2): 0.5 mg via INTRAVENOUS

## 2015-11-12 MED ORDER — BUPIVACAINE-EPINEPHRINE 0.25% -1:200000 IJ SOLN
INTRAMUSCULAR | Status: DC | PRN
  Administered 2015-11-12: 19 mL

## 2015-11-12 MED ORDER — MIDAZOLAM HCL 2 MG/2ML IJ SOLN
INTRAMUSCULAR | Status: AC
  Filled 2015-11-12: qty 2

## 2015-11-12 MED ORDER — LIDOCAINE HCL (CARDIAC) 20 MG/ML IV SOLN
INTRAVENOUS | Status: DC | PRN
  Administered 2015-11-12: 50 mg via INTRAVENOUS

## 2015-11-12 MED ORDER — 0.9 % SODIUM CHLORIDE (POUR BTL) OPTIME
TOPICAL | Status: DC | PRN
  Administered 2015-11-12: 1000 mL

## 2015-11-12 MED ORDER — PROPOFOL 10 MG/ML IV BOLUS
INTRAVENOUS | Status: AC
Start: 2015-11-12 — End: 2015-11-12
  Filled 2015-11-12: qty 20

## 2015-11-12 MED ORDER — ONDANSETRON HCL 4 MG/2ML IJ SOLN
INTRAMUSCULAR | Status: DC | PRN
  Administered 2015-11-12: 4 mg via INTRAVENOUS

## 2015-11-12 MED ORDER — PROPOFOL 10 MG/ML IV BOLUS
INTRAVENOUS | Status: DC | PRN
  Administered 2015-11-12: 100 mg via INTRAVENOUS
  Administered 2015-11-12: 200 mg via INTRAVENOUS
  Administered 2015-11-12: 60 mg via INTRAVENOUS
  Administered 2015-11-12: 40 mg via INTRAVENOUS

## 2015-11-12 MED ORDER — PHENYLEPHRINE HCL 10 MG/ML IJ SOLN
INTRAVENOUS | Status: DC | PRN
  Administered 2015-11-12: 30 ug/min via INTRAVENOUS

## 2015-11-12 MED ORDER — FENTANYL CITRATE (PF) 250 MCG/5ML IJ SOLN
INTRAMUSCULAR | Status: DC | PRN
  Administered 2015-11-12: 25 ug via INTRAVENOUS
  Administered 2015-11-12: 50 ug via INTRAVENOUS
  Administered 2015-11-12: 25 ug via INTRAVENOUS

## 2015-11-12 MED ORDER — PROPOFOL 10 MG/ML IV BOLUS
INTRAVENOUS | Status: AC
  Filled 2015-11-12: qty 20

## 2015-11-12 SURGICAL SUPPLY — 39 items
APPLIER CLIP 9.375 MED OPEN (MISCELLANEOUS)
APR CLP MED 9.3 20 MLT OPN (MISCELLANEOUS)
BINDER BREAST LRG (GAUZE/BANDAGES/DRESSINGS) IMPLANT
BINDER BREAST XLRG (GAUZE/BANDAGES/DRESSINGS) IMPLANT
BLADE SURG 15 STRL LF DISP TIS (BLADE) ×1 IMPLANT
BLADE SURG 15 STRL SS (BLADE) ×3
CANISTER SUCTION 2500CC (MISCELLANEOUS) ×3 IMPLANT
CHLORAPREP W/TINT 26ML (MISCELLANEOUS) ×3 IMPLANT
CLIP APPLIE 9.375 MED OPEN (MISCELLANEOUS) IMPLANT
COVER PROBE W GEL 5X96 (DRAPES) ×3 IMPLANT
COVER SURGICAL LIGHT HANDLE (MISCELLANEOUS) ×3 IMPLANT
DEVICE DUBIN SPECIMEN MAMMOGRA (MISCELLANEOUS) ×3 IMPLANT
DRAPE CHEST BREAST 15X10 FENES (DRAPES) ×3 IMPLANT
DRAPE UTILITY XL STRL (DRAPES) ×3 IMPLANT
ELECT COATED BLADE 2.86 ST (ELECTRODE) ×3 IMPLANT
ELECT REM PT RETURN 9FT ADLT (ELECTROSURGICAL) ×3
ELECTRODE REM PT RTRN 9FT ADLT (ELECTROSURGICAL) ×1 IMPLANT
GLOVE BIO SURGEON STRL SZ7.5 (GLOVE) ×6 IMPLANT
GOWN STRL REUS W/ TWL LRG LVL3 (GOWN DISPOSABLE) ×2 IMPLANT
GOWN STRL REUS W/TWL LRG LVL3 (GOWN DISPOSABLE) ×6
KIT BASIN OR (CUSTOM PROCEDURE TRAY) ×3 IMPLANT
KIT MARKER MARGIN INK (KITS) ×3 IMPLANT
LIQUID BAND (GAUZE/BANDAGES/DRESSINGS) ×3 IMPLANT
NDL HYPO 25X1 1.5 SAFETY (NEEDLE) ×1 IMPLANT
NEEDLE HYPO 25X1 1.5 SAFETY (NEEDLE) ×3 IMPLANT
NS IRRIG 1000ML POUR BTL (IV SOLUTION) ×3 IMPLANT
PACK SURGICAL SETUP 50X90 (CUSTOM PROCEDURE TRAY) ×3 IMPLANT
PENCIL BUTTON HOLSTER BLD 10FT (ELECTRODE) ×3 IMPLANT
SPONGE LAP 18X18 X RAY DECT (DISPOSABLE) ×3 IMPLANT
SUT MNCRL AB 4-0 PS2 18 (SUTURE) ×3 IMPLANT
SUT SILK 2 0 SH (SUTURE) IMPLANT
SUT VIC AB 3-0 SH 18 (SUTURE) ×3 IMPLANT
SYR BULB 3OZ (MISCELLANEOUS) ×3 IMPLANT
SYR CONTROL 10ML LL (SYRINGE) ×3 IMPLANT
TOWEL OR 17X24 6PK STRL BLUE (TOWEL DISPOSABLE) ×3 IMPLANT
TOWEL OR 17X26 10 PK STRL BLUE (TOWEL DISPOSABLE) ×3 IMPLANT
TUBE CONNECTING 12'X1/4 (SUCTIONS) ×1
TUBE CONNECTING 12X1/4 (SUCTIONS) ×2 IMPLANT
YANKAUER SUCT BULB TIP NO VENT (SUCTIONS) ×3 IMPLANT

## 2015-11-12 NOTE — Anesthesia Preprocedure Evaluation (Addendum)
Anesthesia Evaluation  Patient identified by MRN, date of birth, ID band Patient awake    Reviewed: Allergy & Precautions, NPO status , Patient's Chart, lab work & pertinent test results  History of Anesthesia Complications (+) PONV and history of anesthetic complications  Airway Mallampati: I  TM Distance: >3 FB Neck ROM: Full    Dental  (+) Teeth Intact, Dental Advisory Given   Pulmonary asthma , sleep apnea and Continuous Positive Airway Pressure Ventilation , former smoker,    breath sounds clear to auscultation       Cardiovascular hypertension, + Peripheral Vascular Disease   Rhythm:Regular Rate:Normal     Neuro/Psych  Neuromuscular disease negative psych ROS   GI/Hepatic Neg liver ROS, GERD  ,  Endo/Other  diabetes, Well Controlled, Type 2, Oral Hypoglycemic AgentsHypothyroidism   Renal/GU Renal disease     Musculoskeletal  (+) Arthritis , Osteoarthritis,  Fibromyalgia -  Abdominal (+) + obese,  Abdomen: soft.    Peds negative pediatric ROS (+)  Hematology negative hematology ROS (+)   Anesthesia Other Findings - IBS  Reproductive/Obstetrics negative OB ROS                            Lab Results  Component Value Date   WBC 6.1 11/06/2015   HGB 13.1 11/06/2015   HCT 42.2 11/06/2015   MCV 92.7 11/06/2015   PLT 289 11/06/2015    06/2015 EKG: sinus tachycardia.   Anesthesia Physical Anesthesia Plan  ASA: III  Anesthesia Plan: General   Post-op Pain Management:    Induction: Intravenous  Airway Management Planned: LMA  Additional Equipment:   Intra-op Plan:   Post-operative Plan: Extubation in OR  Informed Consent: I have reviewed the patients History and Physical, chart, labs and discussed the procedure including the risks, benefits and alternatives for the proposed anesthesia with the patient or authorized representative who has indicated his/her understanding  and acceptance.   Dental advisory given  Plan Discussed with: CRNA  Anesthesia Plan Comments:         Anesthesia Quick Evaluation

## 2015-11-12 NOTE — Interval H&P Note (Signed)
History and Physical Interval Note:  11/12/2015 10:53 AM  Tricia Perry  has presented today for surgery, with the diagnosis of left breast papilloma  The various methods of treatment have been discussed with the patient and family. After consideration of risks, benefits and other options for treatment, the patient has consented to  Procedure(s): LEFT BREAST LUMPECTOMY WITH RADIOACTIVE SEED LOCALIZATION (Left) as a surgical intervention .  The patient's history has been reviewed, patient examined, no change in status, stable for surgery.  I have reviewed the patient's chart and labs.  Questions were answered to the patient's satisfaction.     TOTH III,Aiyannah Fayad S

## 2015-11-12 NOTE — H&P (Signed)
Tricia Perry  Location: [facility] Patient #: [id number] DOB: 02-23-1956 Married / Language: English / Race: White Female   History of Present Illness  The patient is a 60 year old female who presents with a breast mass. We are asked to see the patient in consultation by Dr. Ammie Ferrier to evaluate her for a left breast intraductal papilloma. The patient is a 60 year old white female who recently noticed some discharge from the left nipple. The initial 2-3 times at this occurred the discharge was clear but then became bloody. She was evaluated with mammogram and ultrasound and there was an intraductal mass noted in the subareolar left lower inner quadrant of the breast. This was biopsied and came back as an intraductal papilloma. She does have a family history of breast cancer in 2 paternal aunts. She does not take any hormone replacement.   Other Problems Arthritis Asthma Back Pain Diabetes Mellitus Gastroesophageal Reflux Disease Heart murmur High blood pressure Kidney Stone Pulmonary Embolism / Blood Clot in Legs Sleep Apnea Thyroid Disease  Past Surgical History Foot Surgery Right. Gallbladder Surgery - Open Gastric Bypass Knee Surgery Right. Laparoscopic Inguinal Hernia Surgery Left. Shoulder Surgery Bilateral.  Diagnostic Studies History  Mammogram within last year Pap Smear 1-5 years ago  Allergies  Codeine Sulfate *ANALGESICS - OPIOID* Morphine Sulfate ER *ANALGESICS - OPIOID* Trichophyton *BIOLOGICALS MISC*  Medication History OxyCODONE HCl (15MG  Tablet, Oral) Active. MetFORMIN HCl (500MG  Tablet, Oral) Active. Irbesartan (300MG  Tablet, Oral) Active. Levothyroxine Sodium (100MCG Tablet, Oral) Active. Advair Diskus (100-50MCG/DOSE Aero Pow Br Act, Inhalation) Active. Medications Reconciled  Social History Caffeine use Carbonated beverages, Coffee, Tea. Tobacco use Former smoker.  Family History Alcohol  Abuse Mother. Arthritis Brother, Mother. Cerebrovascular Accident Father, Mother. Diabetes Mellitus Father, Mother. Heart Disease Father. Heart disease in female family member before age 61 Hypertension Brother, Father, Mother. Thyroid problems Brother.  Pregnancy / Birth History  Age of menopause 75-60 Gravida 37 Maternal age 80-25 Para 1 Regular periods    Review of Systems  General Present- Fatigue, Fever and Weight Gain. Not Present- Appetite Loss, Chills, Night Sweats and Weight Loss. Skin Present- New Lesions. Not Present- Change in Wart/Mole, Dryness, Hives, Jaundice, Non-Healing Wounds, Rash and Ulcer. HEENT Present- Wears glasses/contact lenses. Not Present- Earache, Hearing Loss, Hoarseness, Nose Bleed, Oral Ulcers, Ringing in the Ears, Seasonal Allergies, Sinus Pain, Sore Throat, Visual Disturbances and Yellow Eyes. Breast Present- Nipple Discharge. Not Present- Breast Mass, Breast Pain and Skin Changes. Cardiovascular Present- Difficulty Breathing Lying Down, Shortness of Breath and Swelling of Extremities. Not Present- Chest Pain, Leg Cramps, Palpitations and Rapid Heart Rate. Female Genitourinary Present- Frequency and Nocturia. Not Present- Painful Urination, Pelvic Pain and Urgency. Musculoskeletal Present- Back Pain, Joint Pain, Joint Stiffness, Muscle Weakness and Swelling of Extremities. Not Present- Muscle Pain. Neurological Present- Numbness, Trouble walking and Weakness. Not Present- Decreased Memory, Fainting, Headaches, Seizures, Tingling and Tremor. Endocrine Present- New Diabetes. Not Present- Cold Intolerance, Excessive Hunger, Hair Changes, Heat Intolerance and Hot flashes. Hematology Present- Blood Thinners. Not Present- Easy Bruising, Excessive bleeding, Gland problems, HIV and Persistent Infections.  Vitals  Weight: 332 lb Height: 64.5in Body Surface Area: 2.44 m Body Mass Index: 56.11 kg/m  Temp.: 97.35F(Temporal)  Pulse: 96  (Regular)  BP: 138/86 (Sitting, Left Arm, Standard)       Physical Exam General Mental Status-Alert. General Appearance-Consistent with stated age. Hydration-Well hydrated. Voice-Normal. Note: She is morbidly obese.   Head and Neck Head-normocephalic, atraumatic with no lesions or palpable  masses. Trachea-midline. Thyroid Gland Characteristics - normal size and consistency.  Eye Eyeball - Bilateral-Extraocular movements intact. Sclera/Conjunctiva - Bilateral-No scleral icterus.  Chest and Lung Exam Chest and lung exam reveals -quiet, even and easy respiratory effort with no use of accessory muscles and on auscultation, normal breath sounds, no adventitious sounds and normal vocal resonance. Inspection Chest Wall - Normal. Back - normal.  Breast Note: There is no palpable mass in either breast. There is no palpable axillary, supraclavicular, or cervical lymphadenopathy.   Cardiovascular Cardiovascular examination reveals -normal heart sounds, regular rate and rhythm with no murmurs and normal pedal pulses bilaterally.  Abdomen Inspection Inspection of the abdomen reveals - No Hernias. Skin - Scar - no surgical scars. Palpation/Percussion Palpation and Percussion of the abdomen reveal - Soft, Non Tender, No Rebound tenderness, No Rigidity (guarding) and No hepatosplenomegaly. Auscultation Auscultation of the abdomen reveals - Bowel sounds normal.  Neurologic Neurologic evaluation reveals -alert and oriented x 3 with no impairment of recent or remote memory. Mental Status-Normal.  Musculoskeletal Normal Exam - Left-Upper Extremity Strength Normal and Lower Extremity Strength Normal. Normal Exam - Right-Upper Extremity Strength Normal and Lower Extremity Strength Normal.  Lymphatic Head & Neck  General Head & Neck Lymphatics: Bilateral - Description - Normal. Axillary  General Axillary Region: Bilateral - Description - Normal.  Tenderness - Non Tender. Femoral & Inguinal  Generalized Femoral & Inguinal Lymphatics: Bilateral - Description - Normal. Tenderness - Non Tender.    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 Because of the appearance and because of her bloody nipple discharge I would recommend that this area be removed. I have discussed with her in detail the risks and benefits of the operation to remove this area as well as some of the technical aspects and she understands and wishes to proceed. I will plan for a left breast radioactive seed localized lumpectomy.

## 2015-11-12 NOTE — Anesthesia Procedure Notes (Signed)
Procedure Name: LMA Insertion Date/Time: 11/12/2015 11:38 AM Performed by: Merdis Delay Pre-anesthesia Checklist: Patient identified Patient Re-evaluated:Patient Re-evaluated prior to inductionOxygen Delivery Method: Circle system utilized Preoxygenation: Pre-oxygenation with 100% oxygen Intubation Type: IV induction Ventilation: Mask ventilation without difficulty LMA: LMA inserted LMA Size: 4.0 Placement Confirmation: positive ETCO2,  CO2 detector and breath sounds checked- equal and bilateral Tube secured with: Tape Dental Injury: Teeth and Oropharynx as per pre-operative assessment

## 2015-11-12 NOTE — Anesthesia Postprocedure Evaluation (Signed)
Anesthesia Post Note  Patient: Tricia Perry  Procedure(s) Performed: Procedure(s) (LRB): LEFT BREAST LUMPECTOMY WITH RADIOACTIVE SEED LOCALIZATION (Left)  Patient location during evaluation: PACU Anesthesia Type: General Level of consciousness: awake and alert Pain management: pain level controlled Vital Signs Assessment: post-procedure vital signs reviewed and stable Respiratory status: spontaneous breathing, nonlabored ventilation, respiratory function stable and patient connected to nasal cannula oxygen Cardiovascular status: blood pressure returned to baseline and stable Postop Assessment: no signs of nausea or vomiting Anesthetic complications: no    Last Vitals:  Vitals:   11/12/15 1330 11/12/15 1357  BP: (!) 148/84 (!) 149/78  Pulse: 95 98  Resp:    Temp:      Last Pain:  Vitals:   11/12/15 1401  TempSrc:   PainSc: [place]

## 2015-11-12 NOTE — Transfer of Care (Signed)
Immediate Anesthesia Transfer of Care Note  Patient: Tricia Perry  Procedure(s) Performed: Procedure(s): LEFT BREAST LUMPECTOMY WITH RADIOACTIVE SEED LOCALIZATION (Left)  Patient Location: PACU  Anesthesia Type:General  Level of Consciousness: awake, alert  and oriented  Airway & Oxygen Therapy: Patient Spontanous Breathing and Patient connected to face mask oxygen  Post-op Assessment: Report given to RN and Post -op Vital signs reviewed and stable  Post vital signs: Reviewed and stable  Last Vitals:  Vitals:   11/12/15 0923  BP: 133/80  Pulse: 85  Resp: 18  Temp: 36.7 C    Last Pain:  Vitals:   11/12/15 0946  TempSrc:   PainSc: 0-No pain      Patients Stated Pain Goal: 4 ([id number])  Complications: No apparent anesthesia complications

## 2015-11-12 NOTE — Op Note (Signed)
11/12/2015  12:04 PM  PATIENT:  Tricia Perry  60 y.o. female  PRE-OPERATIVE DIAGNOSIS:  left breast papilloma  POST-OPERATIVE DIAGNOSIS:  Left breast papilloma  PROCEDURE:  Procedure(s): LEFT BREAST LUMPECTOMY WITH RADIOACTIVE SEED LOCALIZATION (Left)  SURGEON:  Surgeon(s) and Role:    * Jovita Kussmaul, MD - Primary  PHYSICIAN ASSISTANT:   ASSISTANTS: none   ANESTHESIA:   general  EBL:  Total I/O In: -  Out: 3 [Blood:3]  BLOOD ADMINISTERED:none  DRAINS: none   LOCAL MEDICATIONS USED:  MARCAINE     SPECIMEN:  Source of Specimen:  left breast tissue  DISPOSITION OF SPECIMEN:  PATHOLOGY  COUNTS:  YES  TOURNIQUET:  * No tourniquets in log *  DICTATION: .Dragon Dictation   After informed consent was obtained the patient was brought to the operating room and placed in the supine position on the operating table. After adequate induction of general anesthesia the patient's left breast was prepped with ChloraPrep, allowed to dry, and draped in usual sterile manner. An appropriate timeout was performed. Previously an I-125 seed was placed in the lower inner quadrant of the subareolar area to mark an area of an intraductal papilloma. The neoprobe was set to I-125 in the area of radioactivity was readily identified. A curvilinear incision was then made with a 15 blade knife along the lower inner edge of the areola. The incision was carried to the skin and subcutaneous tissue sharply with the electrocautery. While checking the area of radioactivity frequently with the neoprobe a circular portion of breast tissue was excised sharply around the radioactive seed. The seed was so superficial that we did encounter the seed during the dissection. The seed was removed and site placed in a separate specimen cup. The rest of the area was then removed sharply with electrocautery. Once the specimen was removed it was oriented with the appropriate paintcolors. A specimen radiograph was obtained that  showed the clip to be within the specimen. The specimen was then sent to pathology for further evaluation. Hemostasis was achieved using the Bovie electrocautery. The wound was infiltrated with quarter percent Marcaine with epinephrine. The deep layer the wound was then closed with layers of interrupted 3-0 Vicryl stitches. The skin was then closed with interrupted 4-0 Monocryl subcuticular stitches. Dermabond dressings were applied. The patient tolerated the procedure well. At the end of the case all needle sponge and instrument counts were correct. The patient was then awakened and taken to recovery in stable condition.  PLAN OF CARE: Discharge to home after PACU  PATIENT DISPOSITION:  PACU - hemodynamically stable.   Delay start of Pharmacological VTE agent (>24hrs) due to surgical blood loss or risk of bleeding: not applicable

## 2015-11-13 ENCOUNTER — Encounter (HOSPITAL_COMMUNITY): Payer: Self-pay | Admitting: General Surgery

## 2016-03-25 ENCOUNTER — Emergency Department (HOSPITAL_BASED_OUTPATIENT_CLINIC_OR_DEPARTMENT_OTHER)
Admission: EM | Admit: 2016-03-25 | Discharge: 2016-03-25 | Disposition: A | Discharge status: Home / Self Care | Payer: BLUE CROSS/BLUE SHIELD | Attending: Emergency Medicine | Admitting: Emergency Medicine

## 2016-03-25 ENCOUNTER — Emergency Department (HOSPITAL_BASED_OUTPATIENT_CLINIC_OR_DEPARTMENT_OTHER): Payer: BLUE CROSS/BLUE SHIELD

## 2016-03-25 ENCOUNTER — Encounter (HOSPITAL_BASED_OUTPATIENT_CLINIC_OR_DEPARTMENT_OTHER): Payer: Self-pay | Admitting: *Deleted

## 2016-03-25 DIAGNOSIS — I10 Essential (primary) hypertension: Secondary | ICD-10-CM | POA: Diagnosis not present

## 2016-03-25 DIAGNOSIS — R0602 Shortness of breath: Secondary | ICD-10-CM | POA: Insufficient documentation

## 2016-03-25 DIAGNOSIS — Z79899 Other long term (current) drug therapy: Secondary | ICD-10-CM | POA: Insufficient documentation

## 2016-03-25 DIAGNOSIS — E119 Type 2 diabetes mellitus without complications: Secondary | ICD-10-CM | POA: Diagnosis not present

## 2016-03-25 DIAGNOSIS — Z87891 Personal history of nicotine dependence: Secondary | ICD-10-CM | POA: Insufficient documentation

## 2016-03-25 DIAGNOSIS — J45909 Unspecified asthma, uncomplicated: Secondary | ICD-10-CM | POA: Insufficient documentation

## 2016-03-25 DIAGNOSIS — E039 Hypothyroidism, unspecified: Secondary | ICD-10-CM | POA: Insufficient documentation

## 2016-03-25 DIAGNOSIS — M7989 Other specified soft tissue disorders: Secondary | ICD-10-CM | POA: Insufficient documentation

## 2016-03-25 DIAGNOSIS — Z7984 Long term (current) use of oral hypoglycemic drugs: Secondary | ICD-10-CM | POA: Diagnosis not present

## 2016-03-25 DIAGNOSIS — Z7982 Long term (current) use of aspirin: Secondary | ICD-10-CM | POA: Insufficient documentation

## 2016-03-25 DIAGNOSIS — R109 Unspecified abdominal pain: Secondary | ICD-10-CM | POA: Insufficient documentation

## 2016-03-25 LAB — CBC WITH DIFFERENTIAL/PLATELET
BASOS PCT: 0 %
Basophils Absolute: 0 10*3/uL (ref 0.0–0.1)
EOS ABS: 0.3 10*3/uL (ref 0.0–0.7)
Eosinophils Relative: 4 %
HCT: 38.3 % (ref 36.0–46.0)
HEMOGLOBIN: 12 g/dL (ref 12.0–15.0)
LYMPHS ABS: 1.7 10*3/uL (ref 0.7–4.0)
Lymphocytes Relative: 22 %
MCH: 29.8 pg (ref 26.0–34.0)
MCHC: 31.3 g/dL (ref 30.0–36.0)
MCV: 95 fL (ref 78.0–100.0)
MONO ABS: 0.6 10*3/uL (ref 0.1–1.0)
MONOS PCT: 9 %
NEUTROS PCT: 65 %
Neutro Abs: 5 10*3/uL (ref 1.7–7.7)
Platelets: 269 10*3/uL (ref 150–400)
RBC: 4.03 MIL/uL (ref 3.87–5.11)
RDW: 13.8 % (ref 11.5–15.5)
WBC: 7.6 10*3/uL (ref 4.0–10.5)

## 2016-03-25 LAB — BASIC METABOLIC PANEL
Anion gap: 8 (ref 5–15)
BUN: 15 mg/dL (ref 6–20)
CALCIUM: 9.5 mg/dL (ref 8.9–10.3)
CHLORIDE: 100 mmol/L — AB (ref 101–111)
CO2: 29 mmol/L (ref 22–32)
CREATININE: 0.56 mg/dL (ref 0.44–1.00)
GFR calc non Af Amer: >60 mL/min (ref >60)
Glucose, Bld: 116 mg/dL — ABNORMAL HIGH (ref 65–99)
Potassium: 4.2 mmol/L (ref 3.5–5.1)
SODIUM: 137 mmol/L (ref 135–145)

## 2016-03-25 LAB — BRAIN NATRIURETIC PEPTIDE: B Natriuretic Peptide: 16.5 pg/mL (ref 0.0–100.0)

## 2016-03-25 LAB — TROPONIN I

## 2016-03-25 MED ORDER — FUROSEMIDE 40 MG PO TABS: 40.0000 mg | ORAL_TABLET | Freq: Every day | ORAL | 0 refills | Status: DC

## 2016-03-25 MED ORDER — IOPAMIDOL (ISOVUE-370) INJECTION 76%
100.0000 mL | Freq: Once | INTRAVENOUS | Status: AC | PRN
  Administered 2016-03-25: 100 mL via INTRAVENOUS

## 2016-03-25 NOTE — ED Notes (Signed)
RT called patient back to fast track to assess. Registration stated she came on with SOB. No distress noted. Able to speak in complete sentences. SAT 100% on RA, RR 18. BBS clear, slightly diminished in bases. Husband said she is more SOB at night, and coughs with lying down. Complains on lower extremity swelling. Spoke with triage RN, and placed patient back in waiting room. RT to monitor as needed.

## 2016-03-25 NOTE — ED Notes (Signed)
Patient transported to Ultrasound 

## 2016-03-25 NOTE — ED Provider Notes (Signed)
[facility] Provider Note   CSN: [id number] Arrival date & time: 03/25/16  1616  By signing my name below, I, Tricia Perry, attest that this documentation has been prepared under the direction and in the presence of Leo Grosser, MD. Electronically Signed: Reola Perry, ED Scribe. 03/25/16. 7:49 PM.  History   Chief Complaint Chief Complaint  Patient presents with  . Shortness of Breath   The history is provided by the patient. No language interpreter was used.  Shortness of Breath  This is a recurrent problem. The average episode lasts 1 week. The problem occurs continuously.The current episode started more than 1 week ago. The problem has been gradually worsening. Associated symptoms include abdominal pain and leg swelling (R>L). Pertinent negatives include no fever. It is unknown what precipitated the problem. Treatments tried: Fluid pill. The treatment provided no relief. She has had no prior hospitalizations. She has had no prior ED visits. She has had no prior ICU admissions. Associated medical issues include PE.    HPI Comments: Tricia Perry is a 60 y.o. female with a PMHx of obesity, prior PE, HTN, and DM2, who presents to the Emergency Department complaining of persistent bilateral lower extremity swelling (right worse than left) onset approximately one week ago, worsening over the past several days. She reports associated intermittent shortness of breath, abdominal pain described as "tightness", and lower extremity pain secondary to the onset of her bilateral lower extremity swelling. Pt notes that her Cardiologist placed her on a fluid pill to take intermittently for this issue, which she last took ~2 days ago. She states that she recently stopped taking this secondary to it not helping with her current swelling. Pt notes that since she stopped this pill, her lower extremity swelling has worsened. Pt was additionally recently seen at [facility] by Dr Alphia Moh (Cardiology) where she had an echocardiogram performed which was significant for an EF of 60% with no significant valvular disease otherwise. Pt is not currently on anticoagulant or antiplatelet therapy. She denies fever, or any other associated symptoms.   Past Medical History:  Diagnosis Date  . Arthritis    Knees; right-TKR, left-bone on bone/end stage  . Asthma   . Carpal tunnel syndrome on both sides 06/21/2010  . Carpal tunnel syndrome, bilateral   . Chronic nonallergic rhinitis    allergy eval NEG 10/2014: azelastine and flonase spray rx'd  . Chronic venous insufficiency    with edema  and extensive varicose dz: Dr. Linus Mako is managing this, planning laser procedures.; A LOT OF LEG CRAMPS AND LEG STIFFNESS  . Complication of anesthesia    STATES SHE WAS TOLD SHE WOKE UP DURING HER KNEE REPLACEMENT SURGERY AND HAD TO BE GIVEN MORE MEDICINE - NOT SURE IF SPINAL OR GENERAL ANESTHESIA  . DDD (degenerative disc disease)    Dr. [name] evaluated her and recommended pain mgmt MD.  She saw Dr. Selinda Orion for pain mgmt at one time.  . Diabetes mellitus without complication (HCC)    Type II  . Disequilibrium syndrome    MRI brain normal 2012  . DIZZINESS 04/12/2010   Qualifier: Diagnosis of  By: Anitra Lauth M.D., Brien Few   . Fibromyalgia    questionable  . GERD (gastroesophageal reflux disease)   . Heart murmur    functional, w/u done; PALPITATIONS IN THE PAST  . History of bronchitis   . History of palpitations   . History of pulmonary embolism 05/2013   Right sided.  Setting: post-op percutaneous nephrolithotomy--xarelto x 6 mo  . Hypertension   . Hypothyroidism   . Insomnia   . Insulin resistance   . Kidney stone on right side 12/2014   Dr. Jeffie Pollock considering PCNL, ESWL, and ureteroscopy as of 01/25/15   . Microscopic hematuria 2014   CT abd pelv showed 1.8 cm right renal pelvis stone.  Also had UA c/w UTI so abx given.   . Mixed incontinence urge and stress    and  nocturia x 5-6 per night  . Morbid obesity (HCC)    BMI 49.  Gastric stapling surgery in distant past.  . Nephrolithiasis    Lithotripsy 2002.  Right percut nephrolith 05/2013.  Marland Kitchen Nocturia   . Numbness and tingling    bilateral hands and feet  . OAB (overactive bladder)    Per Dr. Jeffie Pollock: pt declined pelvic floor PT.  Vesicare trial started 01/25/15.  . Obesity hypoventilation syndrome ([place])   . Peripheral neuropathy ([place])    No old records to confirm pt's report.  Pt refuses to have more NCS b/c test is painful.  Marland Kitchen PONV (postoperative nausea and vomiting)   . Shortness of breath    CHRONIC; 3 mo trial off of ACE-I made no difference.  Spirometry x 2 has shown no obstruction.  Allergy w/u NEG.  . Shoulder pain    HX OF BILATERAL SHOULDER SURGERIES - PT HAS VERY LIMITED ROM - ESPECIALLY RAISING HER ARMS  . Sleep apnea    PT DOES NOT HAVE MASK OR TUBING - JUST SLEEPS WITH HEAD ELEVATED ON 2 PILLOWS   . Spinal stenosis    with L5 radiculopathy, also pseudospondylolisthesis per Dr. [name]  . Superficial thrombophlebitis of left leg 05/2013   with cellulitis--resolved approp with abx and ice  . Urinary incontinence   . Urinary urgency   . Vertigo    Patient Active Problem List   Diagnosis Date Noted  . Maxillary sinusitis 02/19/2015  . OSA (obstructive sleep apnea) 12/13/2013  . Hypothyroidism 12/04/2013  . Varicose veins of lower extremities with other complications [id number]  . Insomnia 06/13/2013  . Pulmonary embolism ([place]) 05/29/2013  . Shortness of breath 04/20/2013  . Insulin resistance 11/29/2012  . HTN (hypertension) 11/29/2012  . Vitamin B12 deficiency 11/29/2012  . Chronic pain 07/11/2010  . Chronic venous insufficiency 06/21/2010  . B12 DEFICIENCY 05/10/2010  . IRRITABLE BOWEL SYNDROME 05/10/2010  . MALABSORPTION SYNDROME 05/10/2010  . ESSENTIAL HYPERTENSION 04/12/2010   Past Surgical History:  Procedure Laterality Date  . BILATERAL SHOULDER SURGERY    . BREAST  LUMPECTOMY WITH RADIOACTIVE SEED LOCALIZATION Left 11/12/2015   Procedure: LEFT BREAST LUMPECTOMY WITH RADIOACTIVE SEED LOCALIZATION;  Surgeon: Autumn Messing III, MD;  Location: [place];  Service: General;  Laterality: Left;  . CHOLECYSTECTOMY  1987  . ENDOMETRIAL ABLATION     menorrhagia  . EXTRACORPOREAL SHOCK WAVE LITHOTRIPSY    . FOOT SURGERY  2000   Right tarsel tunnel release  . GASTRIC RESTRICTION SURGERY  remote past  . HERNIA REPAIR   1980's   Diaphragmatic + gastric stapling  . HERNIA REPAIR     Inguinal  . JOINT REPLACEMENT  2009   Right Knee, [facility]  . NEPHROLITHOTOMY Right 05/31/2013   Procedure: NEPHROLITHOTOMY PERCUTANEOUS;  Surgeon: Irine Seal, MD;  Location: [facility];  Service: Urology;  Laterality: Right;  . PFT's  09/2013; 12/2013   09/2013 mild restriction.  12/2013 Low vital capacity, possibly due to restriction from pt's body habitus.  Marland Kitchen  TRANSTHORACIC ECHOCARDIOGRAM  12/16/13   EF 123456, grade 2 diastolic dysfxn, PA pressure 37 (mildly high)   OB History    No data available     Home Medications    Prior to Admission medications   Medication Sig Start Date End Date Taking? Authorizing Provider  acetaminophen (TYLENOL) 500 MG tablet Take 1,000 mg by mouth every 6 (six) hours as needed for mild pain or headache.    Yes Historical Provider, MD  aspirin EC 81 MG tablet Take 81 mg by mouth every 6 (six) hours as needed for mild pain.  12/02/13  Yes Tammi Sou, MD  azelastine (ASTELIN) 0.1 % nasal spray Place 1-2 sprays into both nostrils 2 (two) times daily as needed for allergies. Reported on 08/09/2015 11/28/14  Yes Historical Provider, MD  fluticasone (FLONASE) 50 MCG/ACT nasal spray Place 2 sprays into both nostrils daily. Reported on 08/09/2015 11/28/14  Yes Historical Provider, MD  furosemide (LASIX) 40 MG tablet Take 40 mg by mouth.   Yes Historical Provider, MD  irbesartan (AVAPRO) 300 MG tablet Take 300 mg by mouth daily. 09/28/15  Yes Historical Provider, MD    levothyroxine (SYNTHROID, LEVOTHROID) 200 MCG tablet TAKE ONE TABLET BY MOUTH ONE TIME DAILY before breakfast 11/07/14  Yes Tammi Sou, MD  metFORMIN (GLUCOPHAGE) 500 MG tablet Take 1 tablet (500 mg total) by mouth 2 (two) times daily with a meal. Patient taking differently: Take 500 mg by mouth daily with breakfast.  07/21/14  Yes Tammi Sou, MD  montelukast (SINGULAIR) 10 MG tablet Take 10 mg by mouth at bedtime.   Yes Historical Provider, MD  oxyCODONE (ROXICODONE) 15 MG immediate release tablet Take 15 mg by mouth 4 (four) times daily. FOR BACK PAIN AND ARTHRITIS AND RT KNEE PAIN   Yes Historical Provider, MD  pravastatin (PRAVACHOL) 20 MG tablet Take 20 mg by mouth daily.   Yes Historical Provider, MD   Family History Family History  Problem Relation Age of Onset  . Hypertension Mother   . Cancer Mother     Brain/Lung/ smoker  . Diabetes Mother   . Stroke Father   . Hypertension Father   . ADD / ADHD Daughter     ADHD  . Thyroid disease Brother   . Hypertension Brother   . Varicose Veins Brother   . Peripheral vascular disease Brother   . Heart attack Maternal Grandfather   . Alcohol abuse Paternal Grandfather    Social History Social History  Substance Use Topics  . Smoking status: Former Smoker    Packs/day: 0.00    Years: 0.00    Quit date: 03/31/1998  . Smokeless tobacco: Never Used  . Alcohol use Yes     Comment: social   Allergies   Gabapentin; Mold extract [trichophyton]; Morphine; and Codeine  Review of Systems Review of Systems  Constitutional: Negative for fever.  Respiratory: Positive for shortness of breath.   Cardiovascular: Positive for leg swelling (R>L).  Gastrointestinal: Positive for abdominal pain.  Musculoskeletal: Positive for myalgias.  All other systems reviewed and are negative.  Physical Exam Updated Vital Signs BP 139/74   Pulse 92   Resp 15   Ht 5\' 4"  (1.626 m)   Wt 300 lb (136.1 kg)   SpO2 100%   BMI 51.49 kg/m    Physical Exam  Constitutional: She is oriented to person, place, and time. She appears well-developed and well-nourished. No distress.  HENT:  Head: Normocephalic.  Nose: Nose normal.  Eyes: Conjunctivae are normal.  Neck: Neck supple. No tracheal deviation present.  Cardiovascular: Normal rate, regular rhythm and normal heart sounds.   No murmur heard. Normal S1 and S2.   Pulmonary/Chest: Effort normal and breath sounds normal. No respiratory distress. She has no wheezes. She has no rales.  Abdominal: Soft. She exhibits no distension.  Musculoskeletal: She exhibits edema.  Mild RLE erythema is noted. 3+ bilateral lower extremity pitting edema.   Neurological: She is alert and oriented to person, place, and time.  Skin: Skin is warm and dry.  Psychiatric: She has a normal mood and affect.   ED Treatments / Results  DIAGNOSTIC STUDIES: Oxygen Saturation is 100% on RA, normal by my interpretation.   COORDINATION OF CARE: 7:49 PM-Discussed next steps with pt. Pt verbalized understanding and is agreeable with the plan.   Labs (all labs ordered are listed, but only abnormal results are displayed) Labs Reviewed  BASIC METABOLIC PANEL - Abnormal; Notable for the following:       Result Value   Chloride 100 (*)    Glucose, Bld 116 (*)    All other components within normal limits  CBC WITH DIFFERENTIAL/PLATELET  TROPONIN I  BRAIN NATRIURETIC PEPTIDE   EKG  EKG Interpretation  Date/Time:  Tuesday March 25 2016 16:51:43 EST Ventricular Rate:  99 PR Interval:    QRS Duration: 91 QT Interval:  328 QTC Calculation: 421 R Axis:   -5 Text Interpretation:  Sinus rhythm Abnormal R-wave progression, early transition No significant change since last tracing Confirmed by Teddie Mehta MD, Donnie Panik [phone]) on 03/25/2016 4:59:05 PM      Radiology Ct Angio Chest Pe W And/or Wo Contrast  Result Date: 03/25/2016 CLINICAL DATA:  Acute onset of shortness of breath. Initial encounter. EXAM:  CT ANGIOGRAPHY CHEST WITH CONTRAST TECHNIQUE: Multidetector CT imaging of the chest was performed using the standard protocol during bolus administration of intravenous contrast. Multiplanar CT image reconstructions and MIPs were obtained to evaluate the vascular anatomy. CONTRAST:  100 mL of Isovue 370 IV contrast COMPARISON:  Chest radiograph performed 02/11/2016, and CTA of the chest performed 07/16/2015 FINDINGS: Cardiovascular:  There is no evidence of pulmonary embolus. Scattered coronary artery calcifications are seen. The heart remains normal in size. The thoracic aorta is unremarkable. The great vessels are within normal limits. Mediastinum/Nodes: No mediastinal lymphadenopathy is seen. No pericardial effusion is identified. The thyroid gland is diminutive and not well assessed. No axillary lymphadenopathy is seen. Lungs/Pleura: Minimal bilateral atelectasis is noted. No pleural effusion or pneumothorax is seen. No masses are identified. Upper Abdomen: The patient is status post cholecystectomy, with clips noted along the gallbladder fossa. The visualized portions of the liver and spleen are unremarkable. The patient is status post reversal of gastric bypass surgery. The visualized portions of the pancreas and adrenal glands are within normal limits. Musculoskeletal: No acute osseous abnormalities are identified. The visualized musculature is unremarkable in appearance. Review of the MIP images confirms the above findings. IMPRESSION: 1. No evidence of pulmonary embolus. 2. Minimal bilateral atelectasis noted.  Lungs otherwise clear. 3. Scattered coronary artery calcifications seen. Electronically Signed   By: Garald Balding M.D.   On: 03/25/2016 20:07   US Venous Img Lower Bilateral  Result Date: 03/25/2016 CLINICAL DATA:  Bilateral lower extremity swelling for 1 week. History of PE. EXAM: BILATERAL LOWER EXTREMITY VENOUS DOPPLER ULTRASOUND TECHNIQUE: Gray-scale sonography with graded compression, as  well as color Doppler and duplex ultrasound were performed to evaluate the lower extremity  deep venous systems from the level of the common femoral vein and including the common femoral, femoral, profunda femoral, popliteal and calf veins including the posterior tibial, peroneal and gastrocnemius veins when visible. The superficial great saphenous vein was also interrogated. Spectral Doppler was utilized to evaluate flow at rest and with distal augmentation maneuvers in the common femoral, femoral and popliteal veins. COMPARISON:  08/29/2014 FINDINGS: Limited study due to patient body habitus. RIGHT LOWER EXTREMITY Common Femoral Vein: No evidence of thrombus. Normal compressibility, respiratory phasicity and response to augmentation. Saphenofemoral Junction: No evidence of thrombus. Profunda Femoral Vein: No evidence of thrombus. Femoral Vein: No evidence of thrombus. Popliteal Vein: No evidence of thrombus. Calf Veins (excluding the peroneal vein which was not visualized): No evidence of thrombus. Normal compressibility and flow on color Doppler imaging. Superficial Great Saphenous Vein: No evidence of thrombus. LEFT LOWER EXTREMITY Common Femoral Vein: No evidence of thrombus. Saphenofemoral Junction: No evidence of thrombus. Profunda Femoral Vein: No evidence of thrombus. Femoral Vein: No evidence of thrombus. Popliteal Vein: No evidence of thrombus. Calf Veins (excluding the peroneal vein which was not visualized): No evidence of thrombus. Superficial Great Saphenous Vein: No evidence of thrombus. IMPRESSION: No evidence of deep venous thrombosis in either lower extremity. Electronically Signed   By: Monte Fantasia M.D.   On: 03/25/2016 19:46   Procedures Procedures  Medications Ordered in ED Medications  iopamidol (ISOVUE-370) 76 % injection 100 mL (100 mLs Intravenous Contrast Given 03/25/16 1920)   Initial Impression / Assessment and Plan / ED Course  I have reviewed the triage vital signs and the  nursing notes.  Pertinent labs & imaging results that were available during my care of the patient were reviewed by me and considered in my medical decision making (see chart for details).  Clinical Course    60 y.o. female presents with leg swelling and shortness of breath. Has h/o prior provoked PE. No DVT or PE on today's studies. No hypoxemia, no BNP elevation to suggest acute diastolic heart failure. Plan for trial of daily lasix therapy to help with peripheral edema. Plan to follow up with PCP as needed and return precautions discussed for worsening or new concerning symptoms.   Final Clinical Impressions(s) / ED Diagnoses   Final diagnoses:  SOB (shortness of breath)  Leg swelling   New Prescriptions Discharge Medication List as of 03/25/2016  8:22 PM     I personally performed the services described in this documentation, which was scribed in my presence. The recorded information has been reviewed and is accurate.       Leo Grosser, MD 03/26/16 [phone]

## 2016-03-25 NOTE — ED Triage Notes (Signed)
C/o sob and swelling in her feet x 2 days. C/o foot pain. C/o abd feeling tight.

## 2016-03-25 NOTE — ED Notes (Signed)
Assisted w/ [facility]

## 2016-07-27 ENCOUNTER — Emergency Department (HOSPITAL_BASED_OUTPATIENT_CLINIC_OR_DEPARTMENT_OTHER)
Admission: EM | Admit: 2016-07-27 | Discharge: 2016-07-27 | Disposition: A | Discharge status: Home / Self Care | Payer: BLUE CROSS/BLUE SHIELD | Attending: Emergency Medicine | Admitting: Emergency Medicine

## 2016-07-27 ENCOUNTER — Encounter (HOSPITAL_BASED_OUTPATIENT_CLINIC_OR_DEPARTMENT_OTHER): Payer: Self-pay | Admitting: *Deleted

## 2016-07-27 ENCOUNTER — Emergency Department (HOSPITAL_BASED_OUTPATIENT_CLINIC_OR_DEPARTMENT_OTHER): Payer: BLUE CROSS/BLUE SHIELD

## 2016-07-27 DIAGNOSIS — E119 Type 2 diabetes mellitus without complications: Secondary | ICD-10-CM | POA: Diagnosis not present

## 2016-07-27 DIAGNOSIS — E039 Hypothyroidism, unspecified: Secondary | ICD-10-CM | POA: Diagnosis not present

## 2016-07-27 DIAGNOSIS — Z87891 Personal history of nicotine dependence: Secondary | ICD-10-CM | POA: Diagnosis not present

## 2016-07-27 DIAGNOSIS — R1030 Lower abdominal pain, unspecified: Secondary | ICD-10-CM | POA: Diagnosis present

## 2016-07-27 DIAGNOSIS — Z7984 Long term (current) use of oral hypoglycemic drugs: Secondary | ICD-10-CM | POA: Insufficient documentation

## 2016-07-27 DIAGNOSIS — J45909 Unspecified asthma, uncomplicated: Secondary | ICD-10-CM | POA: Insufficient documentation

## 2016-07-27 DIAGNOSIS — Z79899 Other long term (current) drug therapy: Secondary | ICD-10-CM | POA: Insufficient documentation

## 2016-07-27 DIAGNOSIS — K5901 Slow transit constipation: Secondary | ICD-10-CM | POA: Insufficient documentation

## 2016-07-27 DIAGNOSIS — Z7982 Long term (current) use of aspirin: Secondary | ICD-10-CM | POA: Insufficient documentation

## 2016-07-27 DIAGNOSIS — R1084 Generalized abdominal pain: Secondary | ICD-10-CM

## 2016-07-27 DIAGNOSIS — I10 Essential (primary) hypertension: Secondary | ICD-10-CM | POA: Insufficient documentation

## 2016-07-27 LAB — CBC WITH DIFFERENTIAL/PLATELET
BASOS ABS: 0 10*3/uL (ref 0.0–0.1)
BASOS PCT: 0 %
EOS ABS: 0.2 10*3/uL (ref 0.0–0.7)
Eosinophils Relative: 3 %
HEMATOCRIT: 38.9 % (ref 36.0–46.0)
HEMOGLOBIN: 12.4 g/dL (ref 12.0–15.0)
Lymphocytes Relative: 26 %
Lymphs Abs: 1.9 10*3/uL (ref 0.7–4.0)
MCH: 30 pg (ref 26.0–34.0)
MCHC: 31.9 g/dL (ref 30.0–36.0)
MCV: 94.2 fL (ref 78.0–100.0)
Monocytes Absolute: 0.8 10*3/uL (ref 0.1–1.0)
Monocytes Relative: 11 %
NEUTROS PCT: 60 %
Neutro Abs: 4.4 10*3/uL (ref 1.7–7.7)
Platelets: 292 10*3/uL (ref 150–400)
RBC: 4.13 MIL/uL (ref 3.87–5.11)
RDW: 14 % (ref 11.5–15.5)
WBC: 7.3 10*3/uL (ref 4.0–10.5)

## 2016-07-27 LAB — COMPREHENSIVE METABOLIC PANEL
ALBUMIN: 4.1 g/dL (ref 3.5–5.0)
ALK PHOS: 103 U/L (ref 38–126)
ALT: 19 U/L (ref 14–54)
ANION GAP: 6 (ref 5–15)
AST: 22 U/L (ref 15–41)
BILIRUBIN TOTAL: 0.4 mg/dL (ref 0.3–1.2)
BUN: 20 mg/dL (ref 6–20)
CO2: 29 mmol/L (ref 22–32)
Calcium: 9.4 mg/dL (ref 8.9–10.3)
Chloride: 102 mmol/L (ref 101–111)
Creatinine, Ser: 0.65 mg/dL (ref 0.44–1.00)
GFR calc Af Amer: >60 mL/min (ref >60)
GFR calc non Af Amer: >60 mL/min (ref >60)
GLUCOSE: 114 mg/dL — AB (ref 65–99)
POTASSIUM: 4.2 mmol/L (ref 3.5–5.1)
SODIUM: 137 mmol/L (ref 135–145)
TOTAL PROTEIN: 7.3 g/dL (ref 6.5–8.1)

## 2016-07-27 LAB — URINALYSIS, ROUTINE W REFLEX MICROSCOPIC
BILIRUBIN URINE: NEGATIVE
GLUCOSE, UA: NEGATIVE mg/dL
HGB URINE DIPSTICK: NEGATIVE
Ketones, ur: NEGATIVE mg/dL
Leukocytes, UA: NEGATIVE
Nitrite: NEGATIVE
PH: 5.5 (ref 5.0–8.0)
Protein, ur: NEGATIVE mg/dL
SPECIFIC GRAVITY, URINE: 1.024 (ref 1.005–1.030)

## 2016-07-27 LAB — LIPASE, BLOOD: LIPASE: 17 U/L (ref 11–51)

## 2016-07-27 MED ORDER — DOCUSATE SODIUM 100 MG PO CAPS: 100.0000 mg | ORAL_CAPSULE | Freq: Two times a day (BID) | ORAL | 0 refills | Status: DC

## 2016-07-27 MED ORDER — ONDANSETRON HCL 4 MG/2ML IJ SOLN
4.0000 mg | Freq: Once | INTRAMUSCULAR | Status: AC
  Administered 2016-07-27: 4 mg via INTRAVENOUS
  Filled 2016-07-27: qty 2

## 2016-07-27 MED ORDER — DICYCLOMINE HCL 20 MG PO TABS: 20.0000 mg | ORAL_TABLET | Freq: Three times a day (TID) | ORAL | 0 refills | Status: DC

## 2016-07-27 MED ORDER — PROMETHAZINE HCL 25 MG PO TABS: 25.0000 mg | ORAL_TABLET | Freq: Four times a day (QID) | ORAL | 0 refills | Status: DC | PRN

## 2016-07-27 MED ORDER — LACTULOSE 10 GM/15ML PO SOLN: 10.0000 g | Freq: Two times a day (BID) | ORAL | 0 refills | Status: DC | PRN

## 2016-07-27 MED ORDER — MAGNESIUM CITRATE PO SOLN: 1.0000 | Freq: Once | ORAL | 0 refills | Status: AC

## 2016-07-27 NOTE — ED Triage Notes (Addendum)
c/o lower general pain that comes and goes. States pain radiates into her back.  States this started a couple weeks ago. Denies any urinary symptoms. c/onausea. States she feels like she had a fever. c/o not feeling well for the past several months. Denies any diarrhea. States hx of kidney stones. Also, c/o constipation this week.

## 2016-07-27 NOTE — ED Provider Notes (Signed)
[facility] Provider Note   CSN: [id number] Arrival date & time: 07/27/16  0129     History   Chief Complaint Chief Complaint  Patient presents with  . Abdominal Pain    HPI Tricia Perry is a 61 y.o. female.  Patient presents to the emergency department with complaints of abdominal pain. Patient reports that she has been having intermittent episodes of sharp pain across her lower abdomen has been occurring for several weeks. She has had nausea associated with the symptoms. She has a history of kidney stones, but the symptoms are different. She has not noticed any hematuria, urinary frequency or dysuria. No diarrhea. Her doctor has prescribed Zofran for her because she has been having chronic nausea issue for the last 6 months. She feels like this has constipated her. She has scheduled follow-up with gastroenterology in 3 weeks.      Past Medical History:  Diagnosis Date  . Arthritis    Knees; right-TKR, left-bone on bone/end stage  . Asthma   . Carpal tunnel syndrome on both sides 06/21/2010  . Carpal tunnel syndrome, bilateral   . Chronic nonallergic rhinitis    allergy eval NEG 10/2014: azelastine and flonase spray rx'd  . Chronic venous insufficiency    with edema  and extensive varicose dz: Dr. Linus Mako is managing this, planning laser procedures.; A LOT OF LEG CRAMPS AND LEG STIFFNESS  . Complication of anesthesia    STATES SHE WAS TOLD SHE WOKE UP DURING HER KNEE REPLACEMENT SURGERY AND HAD TO BE GIVEN MORE MEDICINE - NOT SURE IF SPINAL OR GENERAL ANESTHESIA  . DDD (degenerative disc disease)    Dr. [name] evaluated her and recommended pain mgmt MD.  She saw Dr. Selinda Orion for pain mgmt at one time.  . Diabetes mellitus without complication (HCC)    Type II  . Disequilibrium syndrome    MRI brain normal 2012  . DIZZINESS 04/12/2010   Qualifier: Diagnosis of  By: Anitra Lauth M.D., Brien Few   . Fibromyalgia    questionable  . GERD (gastroesophageal  reflux disease)   . Heart murmur    functional, w/u done; PALPITATIONS IN THE PAST  . History of bronchitis   . History of palpitations   . History of pulmonary embolism 05/2013   Right sided.  Setting: post-op percutaneous nephrolithotomy--xarelto x 6 mo  . Hypertension   . Hypothyroidism   . Insomnia   . Insulin resistance   . Kidney stone on right side 12/2014   Dr. Jeffie Pollock considering PCNL, ESWL, and ureteroscopy as of 01/25/15   . Microscopic hematuria 2014   CT abd pelv showed 1.8 cm right renal pelvis stone.  Also had UA c/w UTI so abx given.   . Mixed incontinence urge and stress    and nocturia x 5-6 per night  . Morbid obesity (HCC)    BMI 49.  Gastric stapling surgery in distant past.  . Nephrolithiasis    Lithotripsy 2002.  Right percut nephrolith 05/2013.  Marland Kitchen Nocturia   . Numbness and tingling    bilateral hands and feet  . OAB (overactive bladder)    Per Dr. Jeffie Pollock: pt declined pelvic floor PT.  Vesicare trial started 01/25/15.  . Obesity hypoventilation syndrome ([place])   . Peripheral neuropathy    No old records to confirm pt's report.  Pt refuses to have more NCS b/c test is painful.  Marland Kitchen PONV (postoperative nausea and vomiting)   . Shortness of breath  CHRONIC; 3 mo trial off of ACE-I made no difference.  Spirometry x 2 has shown no obstruction.  Allergy w/u NEG.  . Shoulder pain    HX OF BILATERAL SHOULDER SURGERIES - PT HAS VERY LIMITED ROM - ESPECIALLY RAISING HER ARMS  . Sleep apnea    PT DOES NOT HAVE MASK OR TUBING - JUST SLEEPS WITH HEAD ELEVATED ON 2 PILLOWS   . Spinal stenosis    with L5 radiculopathy, also pseudospondylolisthesis per Dr. [name]  . Superficial thrombophlebitis of left leg 05/2013   with cellulitis--resolved approp with abx and ice  . Urinary incontinence   . Urinary urgency   . Vertigo     Patient Active Problem List   Diagnosis Date Noted  . Maxillary sinusitis 02/19/2015  . OSA (obstructive sleep apnea) 12/13/2013  . Hypothyroidism  12/04/2013  . Varicose veins of lower extremities with other complications 86/76/1950  . Insomnia 06/13/2013  . Pulmonary embolism ([place]) 05/29/2013  . Shortness of breath 04/20/2013  . Insulin resistance 11/29/2012  . HTN (hypertension) 11/29/2012  . Vitamin B12 deficiency 11/29/2012  . Chronic pain 07/11/2010  . Chronic venous insufficiency 06/21/2010  . B12 DEFICIENCY 05/10/2010  . IRRITABLE BOWEL SYNDROME 05/10/2010  . MALABSORPTION SYNDROME 05/10/2010  . ESSENTIAL HYPERTENSION 04/12/2010    Past Surgical History:  Procedure Laterality Date  . BILATERAL SHOULDER SURGERY    . BREAST LUMPECTOMY WITH RADIOACTIVE SEED LOCALIZATION Left 11/12/2015   Procedure: LEFT BREAST LUMPECTOMY WITH RADIOACTIVE SEED LOCALIZATION;  Surgeon: Autumn Messing III, MD;  Location: [facility];  Service: General;  Laterality: Left;  . CHOLECYSTECTOMY  1987  . ENDOMETRIAL ABLATION     menorrhagia  . EXTRACORPOREAL SHOCK WAVE LITHOTRIPSY    . FOOT SURGERY  2000   Right tarsel tunnel release  . GASTRIC RESTRICTION SURGERY  remote past  . HERNIA REPAIR   1980's   Diaphragmatic + gastric stapling  . HERNIA REPAIR     Inguinal  . JOINT REPLACEMENT  2009   Right Knee, [facility]  . NEPHROLITHOTOMY Right 05/31/2013   Procedure: NEPHROLITHOTOMY PERCUTANEOUS;  Surgeon: Irine Seal, MD;  Location: [facility];  Service: Urology;  Laterality: Right;  . PFT's  09/2013; 12/2013   09/2013 mild restriction.  12/2013 Low vital capacity, possibly due to restriction from pt's body habitus.  . TRANSTHORACIC ECHOCARDIOGRAM  12/16/13   EF 93-26%, grade 2 diastolic dysfxn, PA pressure 37 (mildly high)    OB History    No data available       Home Medications    Prior to Admission medications   Medication Sig Start Date End Date Taking? Authorizing Provider  AMLODIPINE BESYLATE PO Take 10 mg by mouth.   Yes Historical Provider, MD  amlodipine-atorvastatin (CADUET) 10-10 MG tablet Take 1 tablet by mouth daily.   Yes Historical  Provider, MD  irbesartan (AVAPRO) 300 MG tablet Take 300 mg by mouth daily.   Yes Historical Provider, MD  ondansetron (ZOFRAN) 4 MG tablet Take 4 mg by mouth every 8 (eight) hours as needed for nausea or vomiting.   Yes Historical Provider, MD  acetaminophen (TYLENOL) 500 MG tablet Take 1,000 mg by mouth every 6 (six) hours as needed for mild pain or headache.     Historical Provider, MD  aspirin EC 81 MG tablet Take 81 mg by mouth every 6 (six) hours as needed for mild pain.  12/02/13   Tammi Sou, MD  azelastine (ASTELIN) 0.1 % nasal spray Place 1-2 sprays into  both nostrils 2 (two) times daily as needed for allergies. Reported on 08/09/2015 11/28/14   Historical Provider, MD  dicyclomine (BENTYL) 20 MG tablet Take 1 tablet (20 mg total) by mouth 3 (three) times daily before meals. 07/27/16   Orpah Greek, MD  docusate sodium (COLACE) 100 MG capsule Take 1 capsule (100 mg total) by mouth every 12 (twelve) hours. 07/27/16   Orpah Greek, MD  fluticasone (FLONASE) 50 MCG/ACT nasal spray Place 2 sprays into both nostrils daily. Reported on 08/09/2015 11/28/14   Historical Provider, MD  furosemide (LASIX) 40 MG tablet Take 1 tablet (40 mg total) by mouth daily. 03/25/16 03/30/16  Leo Grosser, MD  irbesartan (AVAPRO) 300 MG tablet Take 300 mg by mouth daily. 09/28/15   Historical Provider, MD  lactulose (CHRONULAC) 10 GM/15ML solution Take 15 mLs (10 g total) by mouth 2 (two) times daily as needed for mild constipation or moderate constipation. 07/27/16   Orpah Greek, MD  levothyroxine (SYNTHROID, LEVOTHROID) 200 MCG tablet TAKE ONE TABLET BY MOUTH ONE TIME DAILY before breakfast 11/07/14   Tammi Sou, MD  magnesium citrate SOLN Take 296 mLs (1 Bottle total) by mouth once. 07/27/16 07/27/16  Orpah Greek, MD  metFORMIN (GLUCOPHAGE) 500 MG tablet Take 1 tablet (500 mg total) by mouth 2 (two) times daily with a meal. Patient taking differently: Take 500 mg by mouth  daily with breakfast.  07/21/14   Tammi Sou, MD  montelukast (SINGULAIR) 10 MG tablet Take 10 mg by mouth at bedtime.    Historical Provider, MD  oxyCODONE (ROXICODONE) 15 MG immediate release tablet Take 15 mg by mouth 4 (four) times daily. FOR BACK PAIN AND ARTHRITIS AND RT KNEE PAIN    Historical Provider, MD  pravastatin (PRAVACHOL) 20 MG tablet Take 20 mg by mouth daily.    Historical Provider, MD    Family History Family History  Problem Relation Age of Onset  . Hypertension Mother   . Cancer Mother     Brain/Lung/ smoker  . Diabetes Mother   . Stroke Father   . Hypertension Father   . ADD / ADHD Daughter     ADHD  . Thyroid disease Brother   . Hypertension Brother   . Varicose Veins Brother   . Peripheral vascular disease Brother   . Heart attack Maternal Grandfather   . Alcohol abuse Paternal Grandfather     Social History Social History  Substance Use Topics  . Smoking status: Former Smoker    Packs/day: 0.00    Years: 0.00    Quit date: 03/31/1998  . Smokeless tobacco: Never Used  . Alcohol use Yes     Comment: social     Allergies   Gabapentin; Mold extract [trichophyton]; Morphine; and Codeine   Review of Systems Review of Systems  Gastrointestinal: Positive for abdominal pain, constipation and nausea.  All other systems reviewed and are negative.    Physical Exam Updated Vital Signs BP 129/66 (BP Location: Left Arm)   Pulse 90   Temp 98 F (36.7 C) (Oral)   Resp 20   Ht 5' 4.5" (1.638 m)   Wt 280 lb (127 kg)   SpO2 96%   BMI 47.32 kg/m   Physical Exam  Constitutional: She is oriented to person, place, and time. She appears well-developed and well-nourished. No distress.  HENT:  Head: Normocephalic and atraumatic.  Right Ear: Hearing normal.  Left Ear: Hearing normal.  Nose: Nose normal.  Mouth/Throat: Oropharynx  is clear and moist and mucous membranes are normal.  Eyes: Conjunctivae and EOM are normal. Pupils are equal, round, and  reactive to light.  Neck: Normal range of motion. Neck supple.  Cardiovascular: Regular rhythm, S1 normal and S2 normal.  Exam reveals no gallop and no friction rub.   No murmur heard. Pulmonary/Chest: Effort normal and breath sounds normal. No respiratory distress. She exhibits no tenderness.  Abdominal: Soft. Normal appearance and bowel sounds are normal. There is no hepatosplenomegaly. There is no tenderness. There is no rebound, no guarding, no tenderness at McBurney's point and negative Murphy's sign. No hernia.  Musculoskeletal: Normal range of motion.  Neurological: She is alert and oriented to person, place, and time. She has normal strength. No cranial nerve deficit or sensory deficit. Coordination normal. GCS eye subscore is 4. GCS verbal subscore is 5. GCS motor subscore is 6.  Skin: Skin is warm, dry and intact. No rash noted. No cyanosis.  Psychiatric: She has a normal mood and affect. Her speech is normal and behavior is normal. Thought content normal.  Nursing note and vitals reviewed.    ED Treatments / Results  Labs (all labs ordered are listed, but only abnormal results are displayed) Labs Reviewed  COMPREHENSIVE METABOLIC PANEL - Abnormal; Notable for the following:       Result Value   Glucose, Bld 114 (*)    All other components within normal limits  URINALYSIS, ROUTINE W REFLEX MICROSCOPIC  CBC WITH DIFFERENTIAL/PLATELET  LIPASE, BLOOD    EKG  EKG Interpretation None       Radiology Dg Abd Acute W/chest  Result Date: 07/27/2016 CLINICAL DATA:  Sharp intermittent abdominal and RIGHT flank pain for 10 days, several episodes today. History of kidney stones, morbid obesity, diabetes. EXAM: DG ABDOMEN ACUTE W/ 1V CHEST COMPARISON:  Chest radiograph May 02, 2016 FINDINGS: The cardiac silhouette is mildly enlarged and unchanged. Mediastinal silhouette is nonsuspicious. No pleural effusions or focal consolidation. Prominent epicardial fat pad. No pneumothorax.  Large body habitus. Osseous structures are nonsuspicious. Large amount of retained large bowel stool. Bowel gas pattern is nondilated and nonobstructive. Surgical clips in the included right abdomen compatible with cholecystectomy. Surgical clips at GE junction. Coarse calcifications projecting in lower pole of RIGHT kidney. Large body habitus. Osseous structures are nonsuspicious, likely severe L5-S1 facet arthropathy. IMPRESSION: Stable cardiomegaly, no acute pulmonary process. Large volume retained large bowel stool without bowel obstruction. Suspected RIGHT lower pole nephrolithiasis. Electronically Signed   By: Elon Alas M.D.   On: 07/27/2016 03:14    Procedures Procedures (including critical care time)  Medications Ordered in ED Medications  ondansetron (ZOFRAN) injection 4 mg (not administered)     Initial Impression / Assessment and Plan / ED Course  I have reviewed the triage vital signs and the nursing notes.  Pertinent labs & imaging results that were available during my care of the patient were reviewed by me and considered in my medical decision making (see chart for details).     Patient presents with complaints of abdominal pain which has been intermittent for several weeks. Symptoms are not consistent with previous pain she has had with kidney stones.  Abdominal exam is benign. No concern for acute surgical process. Lab work is entirely normal. This includes blood work and urinalysis. X-ray is consistent with constipation, no evidence of obstruction.  Patient's pain is likely secondary to her constipation. She will require regimen for constipation. She might be experiencing some irritable bowel type cramping  pain as well. Will provide Bentyl. She already has follow-up with gastroenterology. No other acute or urgent interventions necessary at this time.  Final Clinical Impressions(s) / ED Diagnoses   Final diagnoses:  Generalized abdominal pain  Slow transit  constipation    New Prescriptions New Prescriptions   DICYCLOMINE (BENTYL) 20 MG TABLET    Take 1 tablet (20 mg total) by mouth 3 (three) times daily before meals.   DOCUSATE SODIUM (COLACE) 100 MG CAPSULE    Take 1 capsule (100 mg total) by mouth every 12 (twelve) hours.   LACTULOSE (CHRONULAC) 10 GM/15ML SOLUTION    Take 15 mLs (10 g total) by mouth 2 (two) times daily as needed for mild constipation or moderate constipation.   MAGNESIUM CITRATE SOLN    Take 296 mLs (1 Bottle total) by mouth once.     Orpah Greek, MD 07/27/16 [phone]

## 2016-07-27 NOTE — ED Notes (Signed)
ED Provider at bedside. 

## 2016-07-27 NOTE — ED Notes (Signed)
Pt describes intermittent sharp abd pain and right flank x 1-2 weeks. Several episodes today. Radiates to back at times. Denies UTI S/S.  Hx kidney stones "lodged in kidney" +nausea x 6 months.

## 2016-07-27 NOTE — ED Notes (Signed)
Returned from xray

## 2017-03-07 ENCOUNTER — Emergency Department (HOSPITAL_BASED_OUTPATIENT_CLINIC_OR_DEPARTMENT_OTHER): Payer: BLUE CROSS/BLUE SHIELD

## 2017-03-07 ENCOUNTER — Other Ambulatory Visit: Payer: Self-pay

## 2017-03-07 ENCOUNTER — Encounter (HOSPITAL_BASED_OUTPATIENT_CLINIC_OR_DEPARTMENT_OTHER): Payer: Self-pay | Admitting: Emergency Medicine

## 2017-03-07 ENCOUNTER — Emergency Department (HOSPITAL_BASED_OUTPATIENT_CLINIC_OR_DEPARTMENT_OTHER)
Admission: EM | Admit: 2017-03-07 | Discharge: 2017-03-08 | Disposition: A | Discharge status: Home / Self Care | Payer: BLUE CROSS/BLUE SHIELD | Attending: Emergency Medicine | Admitting: Emergency Medicine

## 2017-03-07 DIAGNOSIS — R05 Cough: Secondary | ICD-10-CM | POA: Insufficient documentation

## 2017-03-07 DIAGNOSIS — E039 Hypothyroidism, unspecified: Secondary | ICD-10-CM | POA: Diagnosis not present

## 2017-03-07 DIAGNOSIS — M546 Pain in thoracic spine: Secondary | ICD-10-CM | POA: Diagnosis present

## 2017-03-07 DIAGNOSIS — R5383 Other fatigue: Secondary | ICD-10-CM | POA: Insufficient documentation

## 2017-03-07 DIAGNOSIS — Z87891 Personal history of nicotine dependence: Secondary | ICD-10-CM | POA: Insufficient documentation

## 2017-03-07 DIAGNOSIS — E1140 Type 2 diabetes mellitus with diabetic neuropathy, unspecified: Secondary | ICD-10-CM | POA: Diagnosis not present

## 2017-03-07 DIAGNOSIS — Z96651 Presence of right artificial knee joint: Secondary | ICD-10-CM | POA: Insufficient documentation

## 2017-03-07 DIAGNOSIS — Z7982 Long term (current) use of aspirin: Secondary | ICD-10-CM | POA: Insufficient documentation

## 2017-03-07 DIAGNOSIS — J45909 Unspecified asthma, uncomplicated: Secondary | ICD-10-CM | POA: Diagnosis not present

## 2017-03-07 DIAGNOSIS — Z7902 Long term (current) use of antithrombotics/antiplatelets: Secondary | ICD-10-CM | POA: Insufficient documentation

## 2017-03-07 DIAGNOSIS — Z79899 Other long term (current) drug therapy: Secondary | ICD-10-CM | POA: Insufficient documentation

## 2017-03-07 DIAGNOSIS — Z7984 Long term (current) use of oral hypoglycemic drugs: Secondary | ICD-10-CM | POA: Diagnosis not present

## 2017-03-07 LAB — BASIC METABOLIC PANEL
ANION GAP: 9 (ref 5–15)
BUN: 13 mg/dL (ref 6–20)
CHLORIDE: 98 mmol/L — AB (ref 101–111)
CO2: 29 mmol/L (ref 22–32)
CREATININE: 0.53 mg/dL (ref 0.44–1.00)
Calcium: 9.6 mg/dL (ref 8.9–10.3)
GFR calc non Af Amer: >60 mL/min (ref >60)
Glucose, Bld: 99 mg/dL (ref 65–99)
POTASSIUM: 3.9 mmol/L (ref 3.5–5.1)
SODIUM: 136 mmol/L (ref 135–145)

## 2017-03-07 LAB — CBC WITH DIFFERENTIAL/PLATELET
Basophils Absolute: 0 10*3/uL (ref 0.0–0.1)
Basophils Relative: 1 %
EOS ABS: 0.3 10*3/uL (ref 0.0–0.7)
Eosinophils Relative: 4 %
HCT: 43.2 % (ref 36.0–46.0)
HEMOGLOBIN: 13.7 g/dL (ref 12.0–15.0)
LYMPHS ABS: 1.9 10*3/uL (ref 0.7–4.0)
Lymphocytes Relative: 26 %
MCH: 29.6 pg (ref 26.0–34.0)
MCHC: 31.7 g/dL (ref 30.0–36.0)
MCV: 93.3 fL (ref 78.0–100.0)
Monocytes Absolute: 0.6 10*3/uL (ref 0.1–1.0)
Monocytes Relative: 9 %
NEUTROS ABS: 4.2 10*3/uL (ref 1.7–7.7)
NEUTROS PCT: 60 %
Platelets: 301 10*3/uL (ref 150–400)
RBC: 4.63 MIL/uL (ref 3.87–5.11)
RDW: 14.5 % (ref 11.5–15.5)
WBC: 7 10*3/uL (ref 4.0–10.5)

## 2017-03-07 LAB — D-DIMER, QUANTITATIVE: D-Dimer, Quant: 1.34 ug/mL-FEU — ABNORMAL HIGH (ref 0.00–0.50)

## 2017-03-07 LAB — TROPONIN I

## 2017-03-07 MED ORDER — IOPAMIDOL (ISOVUE-370) INJECTION 76%
100.0000 mL | Freq: Once | INTRAVENOUS | Status: AC | PRN
  Administered 2017-03-07: 100 mL via INTRAVENOUS

## 2017-03-07 NOTE — ED Provider Notes (Signed)
[facility] EMERGENCY DEPARTMENT Provider Note   CSN: [id number] Arrival date & time: 03/07/17  1629     History   Chief Complaint Chief Complaint  Patient presents with  . Back Pain    HPI Tricia Perry is a 61 y.o. female.  The history is provided by the patient. No language interpreter was used.  Back Pain      Tricia Perry is a 61 y.o. female who presents to the Emergency Department complaining of pain.  She reports 2 weeks of right-sided thoracic back pain.  Pain is between her shoulder blade and lower rib margin.  Pain is between sharp and dull in nature.  It is constant but worse with movement and deep breaths.  Symptoms have gradually worsened over the last 2 weeks.  She also reports just feeling fatigued and unwell.  She has minimal cough.  She saw her PCP a few days ago and have labs performed at that time.  No reports of fevers but she feels hot at times.  She does have a history of DVT but is not currently anticoagulated.  She has chronic lower extremity edema that is at her baseline.  Past Medical History:  Diagnosis Date  . Arthritis    Knees; right-TKR, left-bone on bone/end stage  . Asthma   . Carpal tunnel syndrome on both sides 06/21/2010  . Carpal tunnel syndrome, bilateral   . Chronic nonallergic rhinitis    allergy eval NEG 10/2014: azelastine and flonase spray rx'd  . Chronic venous insufficiency    with edema  and extensive varicose dz: Dr. Linus Mako is managing this, planning laser procedures.; A LOT OF LEG CRAMPS AND LEG STIFFNESS  . Complication of anesthesia    STATES SHE WAS TOLD SHE WOKE UP DURING HER KNEE REPLACEMENT SURGERY AND HAD TO BE GIVEN MORE MEDICINE - NOT SURE IF SPINAL OR GENERAL ANESTHESIA  . DDD (degenerative disc disease)    Dr. [name] evaluated her and recommended pain mgmt MD.  She saw Dr. Selinda Orion for pain mgmt at one time.  . Diabetes mellitus without complication (HCC)    Type II  . Disequilibrium syndrome    MRI  brain normal 2012  . DIZZINESS 04/12/2010   Qualifier: Diagnosis of  By: Anitra Lauth M.D., Brien Few   . Fibromyalgia    questionable  . GERD (gastroesophageal reflux disease)   . Heart murmur    functional, w/u done; PALPITATIONS IN THE PAST  . History of bronchitis   . History of palpitations   . History of pulmonary embolism 05/2013   Right sided.  Setting: post-op percutaneous nephrolithotomy--xarelto x 6 mo  . Hypertension   . Hypothyroidism   . Insomnia   . Insulin resistance   . Kidney stone on right side 12/2014   Dr. Jeffie Pollock considering PCNL, ESWL, and ureteroscopy as of 01/25/15   . Microscopic hematuria 2014   CT abd pelv showed 1.8 cm right renal pelvis stone.  Also had UA c/w UTI so abx given.   . Mixed incontinence urge and stress    and nocturia x 5-6 per night  . Morbid obesity (HCC)    BMI 49.  Gastric stapling surgery in distant past.  . Nephrolithiasis    Lithotripsy 2002.  Right percut nephrolith 05/2013.  Marland Kitchen Nocturia   . Numbness and tingling    bilateral hands and feet  . OAB (overactive bladder)    Per Dr. Jeffie Pollock: pt declined pelvic floor PT.  Vesicare  trial started 01/25/15.  . Obesity hypoventilation syndrome ([place])   . Peripheral neuropathy    No old records to confirm pt's report.  Pt refuses to have more NCS b/c test is painful.  Marland Kitchen PONV (postoperative nausea and vomiting)   . Shortness of breath    CHRONIC; 3 mo trial off of ACE-I made no difference.  Spirometry x 2 has shown no obstruction.  Allergy w/u NEG.  . Shoulder pain    HX OF BILATERAL SHOULDER SURGERIES - PT HAS VERY LIMITED ROM - ESPECIALLY RAISING HER ARMS  . Sleep apnea    PT DOES NOT HAVE MASK OR TUBING - JUST SLEEPS WITH HEAD ELEVATED ON 2 PILLOWS   . Spinal stenosis    with L5 radiculopathy, also pseudospondylolisthesis per Dr. [name]  . Superficial thrombophlebitis of left leg 05/2013   with cellulitis--resolved approp with abx and ice  . Urinary incontinence   . Urinary urgency   .  Vertigo     Patient Active Problem List   Diagnosis Date Noted  . Maxillary sinusitis 02/19/2015  . OSA (obstructive sleep apnea) 12/13/2013  . Hypothyroidism 12/04/2013  . Varicose veins of lower extremities with other complications 96/75/9163  . Insomnia 06/13/2013  . Pulmonary embolism ([place]) 05/29/2013  . Shortness of breath 04/20/2013  . Insulin resistance 11/29/2012  . HTN (hypertension) 11/29/2012  . Vitamin B12 deficiency 11/29/2012  . Chronic pain 07/11/2010  . Chronic venous insufficiency 06/21/2010  . B12 DEFICIENCY 05/10/2010  . IRRITABLE BOWEL SYNDROME 05/10/2010  . MALABSORPTION SYNDROME 05/10/2010  . ESSENTIAL HYPERTENSION 04/12/2010    Past Surgical History:  Procedure Laterality Date  . BILATERAL SHOULDER SURGERY    . BREAST LUMPECTOMY WITH RADIOACTIVE SEED LOCALIZATION Left 11/12/2015   Procedure: LEFT BREAST LUMPECTOMY WITH RADIOACTIVE SEED LOCALIZATION;  Surgeon: Autumn Messing III, MD;  Location: [facility];  Service: General;  Laterality: Left;  . CHOLECYSTECTOMY  1987  . ENDOMETRIAL ABLATION     menorrhagia  . EXTRACORPOREAL SHOCK WAVE LITHOTRIPSY    . FOOT SURGERY  2000   Right tarsel tunnel release  . GASTRIC RESTRICTION SURGERY  remote past  . HERNIA REPAIR   1980's   Diaphragmatic + gastric stapling  . HERNIA REPAIR     Inguinal  . JOINT REPLACEMENT  2009   Right Knee, [facility]  . NEPHROLITHOTOMY Right 05/31/2013   Procedure: NEPHROLITHOTOMY PERCUTANEOUS;  Surgeon: Irine Seal, MD;  Location: [facility];  Service: Urology;  Laterality: Right;  . PFT's  09/2013; 12/2013   09/2013 mild restriction.  12/2013 Low vital capacity, possibly due to restriction from pt's body habitus.  . TRANSTHORACIC ECHOCARDIOGRAM  12/16/13   EF 84-66%, grade 2 diastolic dysfxn, PA pressure 37 (mildly high)    OB History    No data available       Home Medications    Prior to Admission medications   Medication Sig Start Date End Date Taking? Authorizing Provider    acetaminophen (TYLENOL) 500 MG tablet Take 1,000 mg by mouth every 6 (six) hours as needed for mild pain or headache.    Yes [provider]  AMLODIPINE BESYLATE PO Take 10 mg by mouth.   Yes [provider]  aspirin EC 81 MG tablet Take 81 mg by mouth every 6 (six) hours as needed for mild pain.  12/02/13  Yes McGowen, Adrian Blackwater, MD  azelastine (ASTELIN) 0.1 % nasal spray Place 1-2 sprays into both nostrils 2 (two) times daily as needed for allergies. Reported  on 08/09/2015 11/28/14  Yes [provider]  fluticasone (FLONASE) 50 MCG/ACT nasal spray Place 2 sprays into both nostrils daily. Reported on 08/09/2015 11/28/14  Yes [provider]  levothyroxine (SYNTHROID, LEVOTHROID) 200 MCG tablet TAKE ONE TABLET BY MOUTH ONE TIME DAILY before breakfast 11/07/14  Yes McGowen, Adrian Blackwater, MD  losartan (COZAAR) 50 MG tablet Take 50 mg by mouth daily.   Yes [provider]  metFORMIN (GLUCOPHAGE) 500 MG tablet Take 1 tablet (500 mg total) by mouth 2 (two) times daily with a meal. Patient taking differently: Take 500 mg by mouth daily with breakfast.  07/21/14  Yes McGowen, Adrian Blackwater, MD  montelukast (SINGULAIR) 10 MG tablet Take 10 mg by mouth at bedtime.   Yes [provider]  oxyCODONE (ROXICODONE) 15 MG immediate release tablet Take 15 mg by mouth 4 (four) times daily. FOR BACK PAIN AND ARTHRITIS AND RT KNEE PAIN   Yes [provider]  amlodipine-atorvastatin (CADUET) 10-10 MG tablet Take 1 tablet by mouth daily.    [provider]  dicyclomine (BENTYL) 20 MG tablet Take 1 tablet (20 mg total) by mouth 3 (three) times daily before meals. 07/27/16   Orpah Greek, MD  docusate sodium (COLACE) 100 MG capsule Take 1 capsule (100 mg total) by mouth every 12 (twelve) hours. 07/27/16   Orpah Greek, MD  furosemide (LASIX) 40 MG tablet Take 1 tablet (40 mg total) by mouth daily. 03/25/16 03/30/16  Leo Grosser, MD  irbesartan  (AVAPRO) 300 MG tablet Take 300 mg by mouth daily. 09/28/15   [provider]  irbesartan (AVAPRO) 300 MG tablet Take 300 mg by mouth daily.    [provider]  lactulose (CHRONULAC) 10 GM/15ML solution Take 15 mLs (10 g total) by mouth 2 (two) times daily as needed for mild constipation or moderate constipation. 07/27/16   Orpah Greek, MD  ondansetron (ZOFRAN) 4 MG tablet Take 4 mg by mouth every 8 (eight) hours as needed for nausea or vomiting.    [provider]  pravastatin (PRAVACHOL) 20 MG tablet Take 20 mg by mouth daily.    [provider]  promethazine (PHENERGAN) 25 MG tablet Take 1 tablet (25 mg total) by mouth every 6 (six) hours as needed for nausea or vomiting. 07/27/16   Pollina, Gwenyth Allegra, MD    Family History Family History  Problem Relation Age of Onset  . Hypertension Mother   . Cancer Mother        Brain/Lung/ smoker  . Diabetes Mother   . Stroke Father   . Hypertension Father   . ADD / ADHD Daughter        ADHD  . Thyroid disease Brother   . Hypertension Brother   . Varicose Veins Brother   . Peripheral vascular disease Brother   . Heart attack Maternal Grandfather   . Alcohol abuse Paternal Grandfather     Social History Social History   Tobacco Use  . Smoking status: Former Smoker    Packs/day: 0.00    Years: 0.00    Pack years: 0.00    Last attempt to quit: 03/31/1998    Years since quitting: 18.9  . Smokeless tobacco: Never Used  Substance Use Topics  . Alcohol use: Yes    Comment: social  . Drug use: No     Allergies   Gabapentin; Mold extract [trichophyton]; Morphine; and Codeine   Review of Systems Review of Systems  Musculoskeletal: Positive for back  pain.  All other systems reviewed and are negative.    Physical Exam Updated Vital Signs BP (!) 150/71 (BP Location: Right Arm) Comment: Simultaneous filing. User may not have seen previous data. Comment (BP Location): Right forearm   Pulse 95 Comment: Simultaneous filing. User may not have seen previous data.  Temp 98.1 F (36.7 C) (Oral)   Resp 20   Ht 5' 4.5" (1.638 m)   Wt (!) 137 kg (302 lb)   SpO2 95% Comment: Simultaneous filing. User may not have seen previous data.  BMI 51.04 kg/m   Physical Exam  Constitutional: She is oriented to person, place, and time. She appears well-developed and well-nourished.  HENT:  Head: Normocephalic and atraumatic.  Cardiovascular: Normal rate and regular rhythm.  No murmur heard. Pulmonary/Chest: Effort normal and breath sounds normal. No respiratory distress.  There is mild tenderness to palpation over the right posterior thorax without any local erythema or rash.  Abdominal: Soft. There is no tenderness. There is no rebound and no guarding.  Musculoskeletal: She exhibits no tenderness.  Nonpitting edema to bilateral lower extremities  Neurological: She is alert and oriented to person, place, and time.  Skin: Skin is warm and dry.  Psychiatric: She has a normal mood and affect. Her behavior is normal.  Nursing note and vitals reviewed.    ED Treatments / Results  Labs (all labs ordered are listed, but only abnormal results are displayed) Labs Reviewed  BASIC METABOLIC PANEL - Abnormal; Notable for the following components:      Result Value   Chloride 98 (*)    All other components within normal limits  D-DIMER, QUANTITATIVE (NOT AT [facility]) - Abnormal; Notable for the following components:   D-Dimer, Quant 1.34 (*)    All other components within normal limits  CBC WITH DIFFERENTIAL/PLATELET  TROPONIN I    EKG  EKG Interpretation  Date/Time:  Saturday March 07 2017 19:56:49 EST Ventricular Rate:  89 PR Interval:  144 QRS Duration: 78 QT Interval:  336 QTC Calculation: 408 R Axis:   -14 Text Interpretation:  Normal sinus rhythm Cannot rule out Anterior infarct , age undetermined Abnormal ECG Confirmed by Quintella Reichert [phone]) on 03/07/2017 8:11:12 PM        Radiology Dg Chest 2 View  Result Date: 03/07/2017 CLINICAL DATA:  Shortness of breath for several weeks EXAM: CHEST  2 VIEW COMPARISON:  07/27/2016 FINDINGS: Cardiac shadow is enlarged. The lungs are well aerated bilaterally. No focal infiltrate or sizable effusion is seen. Degenerative changes of the thoracic spine are noted. Changes of prior cholecystectomy are seen. IMPRESSION: No active cardiopulmonary disease. Electronically Signed   By: Inez Catalina M.D.   On: 03/07/2017 20:05   Ct Angio Chest Pe W/cm &/or Wo Cm  Result Date: 03/07/2017 CLINICAL DATA:  History of pulmonary emboli. Right thoracic back pain worse with inspiration. Shortness of breath. EXAM: CT ANGIOGRAPHY CHEST WITH CONTRAST TECHNIQUE: Multidetector CT imaging of the chest was performed using the standard protocol during bolus administration of intravenous contrast. Multiplanar CT image reconstructions and MIPs were obtained to evaluate the vascular anatomy. CONTRAST:  162mL ISOVUE-370 IOPAMIDOL (ISOVUE-370) INJECTION 76% COMPARISON:  Chest x-ray March 07, 2017. Chest CT March 25, 2016. FINDINGS: Cardiovascular: The heart is stable with cardiomegaly. No coronary artery calcifications identified. The thoracic aorta is nonaneurysmal with no dissection. Evaluation of the pulmonary arteries is limited due to respiratory motion. The images are also quite noisy due to the patient's body habitus. Evaluation is  significantly limited beyond the segmental branch level. Taking these limitations into account, no definitive central emboli identified. Mediastinum/Nodes: The thyroid and esophagus are normal. No effusion. No adenopathy. Lungs/Pleura: Central airways are normal. No pneumothorax. Significant respiratory motion. No nodule, mass, or infiltrate. Upper Abdomen: No acute abnormality. Musculoskeletal: Degenerative changes are seen throughout the thoracic spine. Review of the MIP images confirms the above findings. IMPRESSION: 1.  Evaluation for pulmonary emboli is limited beyond the segmental branch level. Within this limitation, no emboli identified. 2. No other acute abnormalities. Electronically Signed   By: Dorise Bullion III M.D   On: 03/07/2017 22:46    Procedures Procedures (including critical care time)  Medications Ordered in ED Medications  iopamidol (ISOVUE-370) 76 % injection 100 mL (100 mLs Intravenous Contrast Given 03/07/17 2159)     Initial Impression / Assessment and Plan / ED Course  I have reviewed the triage vital signs and the nursing notes.  Pertinent labs & imaging results that were available during my care of the patient were reviewed by me and considered in my medical decision making (see chart for details).     Patient with history of PE here for evaluation of 2 weeks of progressive right-sided thoracic back pain is worse with movement and breathing.  She is well-appearing on examination distress.  There is no evidence of pneumonia.  Presentation is not consistent with CHF, ACS.  D-dimer is elevated and CTA PE study obtained.  CT is negative for acute significant pulmonary embolism.  Discussed with patient home care for musculoskeletal back pain.  Discussed range of motion and activity as well as Lidoderm patches.  Discussed outpatient follow-up and return precautions.  Final Clinical Impressions(s) / ED Diagnoses   Final diagnoses:  Acute right-sided thoracic back pain    ED Discharge Orders    None       Quintella Reichert, MD 03/08/17 [phone]

## 2017-03-07 NOTE — ED Triage Notes (Addendum)
R thoracic back pain, worse with inspiration x 2 weeks. Pt saw PCP for same earlier this week.

## 2017-03-07 NOTE — Discharge Instructions (Signed)
You can apply a lidoderm patch to your back, available over the counter, for back pain.

## 2017-03-07 NOTE — ED Notes (Signed)
Pt states her PCP "pushed on her back", but didn't give her anything. Hx UTI the week before that and was given an antibiotic. Pt states she was [facility] and had pain to the area at her right shoulder blade when she moves or stoops that catches like a knife. Takes Oxycodone 15 mg and Tylenol for same. Also c/o dry cough.

## 2017-03-07 NOTE — ED Notes (Signed)
Unable to obtain blood from IV site. Straight stick x 2 attempts with success.

## 2017-03-07 NOTE — ED Notes (Signed)
Pt to CT

## 2017-03-07 NOTE — ED Notes (Signed)
Returned from xray

## 2017-03-08 NOTE — ED Notes (Signed)
Pt given d/c instructions as per chart. Verbalizes understanding. No questions. 

## 2017-08-19 ENCOUNTER — Other Ambulatory Visit: Payer: Self-pay

## 2017-08-19 ENCOUNTER — Encounter (HOSPITAL_BASED_OUTPATIENT_CLINIC_OR_DEPARTMENT_OTHER): Payer: Self-pay

## 2017-08-19 ENCOUNTER — Emergency Department (HOSPITAL_BASED_OUTPATIENT_CLINIC_OR_DEPARTMENT_OTHER)
Admission: EM | Admit: 2017-08-19 | Discharge: 2017-08-19 | Disposition: A | Discharge status: Home / Self Care | Payer: BLUE CROSS/BLUE SHIELD | Attending: Emergency Medicine | Admitting: Emergency Medicine

## 2017-08-19 DIAGNOSIS — M25511 Pain in right shoulder: Secondary | ICD-10-CM | POA: Diagnosis not present

## 2017-08-19 DIAGNOSIS — Z79899 Other long term (current) drug therapy: Secondary | ICD-10-CM | POA: Insufficient documentation

## 2017-08-19 DIAGNOSIS — I10 Essential (primary) hypertension: Secondary | ICD-10-CM | POA: Diagnosis not present

## 2017-08-19 DIAGNOSIS — Z87891 Personal history of nicotine dependence: Secondary | ICD-10-CM | POA: Diagnosis not present

## 2017-08-19 DIAGNOSIS — Z7984 Long term (current) use of oral hypoglycemic drugs: Secondary | ICD-10-CM | POA: Insufficient documentation

## 2017-08-19 DIAGNOSIS — E119 Type 2 diabetes mellitus without complications: Secondary | ICD-10-CM | POA: Diagnosis not present

## 2017-08-19 DIAGNOSIS — Z7982 Long term (current) use of aspirin: Secondary | ICD-10-CM | POA: Insufficient documentation

## 2017-08-19 DIAGNOSIS — Z96651 Presence of right artificial knee joint: Secondary | ICD-10-CM | POA: Insufficient documentation

## 2017-08-19 DIAGNOSIS — E1140 Type 2 diabetes mellitus with diabetic neuropathy, unspecified: Secondary | ICD-10-CM | POA: Diagnosis not present

## 2017-08-19 DIAGNOSIS — E039 Hypothyroidism, unspecified: Secondary | ICD-10-CM | POA: Diagnosis not present

## 2017-08-19 MED ORDER — KETOROLAC TROMETHAMINE 30 MG/ML IJ SOLN
30.0000 mg | Freq: Once | INTRAMUSCULAR | Status: AC
  Administered 2017-08-19: 30 mg via INTRAMUSCULAR
  Filled 2017-08-19: qty 1

## 2017-08-19 MED ORDER — METHOCARBAMOL 500 MG PO TABS: 500.0000 mg | ORAL_TABLET | Freq: Two times a day (BID) | ORAL | 0 refills | Status: DC

## 2017-08-19 MED FILL — METHOCARBAMOL 500 MG TABLET: 500 | 10 days supply | Qty: 20 | Fill #0

## 2017-08-19 NOTE — ED Triage Notes (Signed)
C/o pain to right UE x "few weeks"-denies injury-pt with slow steady gait with own cane-NAD

## 2017-08-19 NOTE — ED Notes (Signed)
Patient had a rotator cuff surgery 1990's and the numbness and sharp pain has been going on for 6 months.  She had cortisone shot 2 weeks ago. Has difficulty raising her right arm started last night.

## 2017-08-19 NOTE — ED Provider Notes (Signed)
[facility] Provider Note   CSN: [id number] Arrival date & time: 08/19/17  1300     History   Chief Complaint Chief Complaint  Patient presents with  . Arm Pain    HPI T[facility] is a 62 y.o. female.  The history is provided by the patient and medical records. No language interpreter was used.  Arm Pain   Tricia [facility] is a 62 y.o. female who presents to the [facility] complaining of persistent right shoulder pain off and on for the last 6 months.  About 2 weeks ago, she saw her orthopedic doctor at [place] where she had a cortisone shot to the shoulder.  She also had x-rays done at that time.  She reports no improvement with her cortisone shot.  She takes oxycodone daily for her chronic arthritis pain.  She has not tried any other medications prior to arrival for her symptoms.  She has intermittent numbness in her hand which occurs mostly at night.  She sleeps on her right side and will wake up in the middle of the night feeling as if her hand is asleep.  She will get up and move the arm around which will improve this.  This is been ongoing for several weeks as well.  She informed her orthopedic doctor about this to, but states that he did not give her a great answer as to why the numbness was occurring.  She is having difficulty moving the right arm above her head, but denies any difficulty with grip strength or moving the elbow.   Past Medical History:  Diagnosis Date  . Arthritis    Knees; right-TKR, left-bone on bone/end stage  . Asthma   . Carpal tunnel syndrome on both sides 06/21/2010  . Carpal tunnel syndrome, bilateral   . Chronic nonallergic rhinitis    allergy eval NEG 10/2014: azelastine and flonase spray rx'd  . Chronic venous insufficiency    with edema  and extensive varicose dz: Dr. Linus Mako is managing this, planning laser procedures.; A LOT OF LEG CRAMPS AND LEG STIFFNESS  . Complication of  anesthesia    STATES SHE WAS TOLD SHE WOKE UP DURING HER KNEE REPLACEMENT SURGERY AND HAD TO BE GIVEN MORE MEDICINE - NOT SURE IF SPINAL OR GENERAL ANESTHESIA  . DDD (degenerative disc disease)    Dr. [name] evaluated her and recommended pain mgmt MD.  She saw Dr. Selinda Orion for pain mgmt at one time.  . Diabetes mellitus without complication (HCC)    Type II  . Disequilibrium syndrome    MRI brain normal 2012  . DIZZINESS 04/12/2010   Qualifier: Diagnosis of  By: Anitra Lauth M.D., Brien Few   . Fibromyalgia    questionable  . GERD (gastroesophageal reflux disease)   . Heart murmur    functional, w/u done; PALPITATIONS IN THE PAST  . History of bronchitis   . History of palpitations   . History of pulmonary embolism 05/2013   Right sided.  Setting: post-op percutaneous nephrolithotomy--xarelto x 6 mo  . Hypertension   . Hypothyroidism   . Insomnia   . Insulin resistance   . Kidney stone on right side 12/2014   Dr. Jeffie Pollock considering PCNL, ESWL, and ureteroscopy as of 01/25/15   . Microscopic hematuria 2014   CT abd pelv showed 1.8 cm right renal pelvis stone.  Also had UA c/w UTI so abx given.   . Mixed incontinence urge and stress  and nocturia x 5-6 per night  . Morbid obesity (HCC)    BMI 49.  Gastric stapling surgery in distant past.  . Nephrolithiasis    Lithotripsy 2002.  Right percut nephrolith 05/2013.  Marland Kitchen Nocturia   . Numbness and tingling    bilateral hands and feet  . OAB (overactive bladder)    Per Dr. Jeffie Pollock: pt declined pelvic floor PT.  Vesicare trial started 01/25/15.  . Obesity hypoventilation syndrome ([place])   . Peripheral neuropathy    No old records to confirm pt's report.  Pt refuses to have more NCS b/c test is painful.  Marland Kitchen PONV (postoperative nausea and vomiting)   . Shortness of breath    CHRONIC; 3 mo trial off of ACE-I made no difference.  Spirometry x 2 has shown no obstruction.  Allergy w/u NEG.  . Shoulder pain    HX OF BILATERAL SHOULDER SURGERIES - PT  HAS VERY LIMITED ROM - ESPECIALLY RAISING HER ARMS  . Sleep apnea    PT DOES NOT HAVE MASK OR TUBING - JUST SLEEPS WITH HEAD ELEVATED ON 2 PILLOWS   . Spinal stenosis    with L5 radiculopathy, also pseudospondylolisthesis per Dr. [name]  . Superficial thrombophlebitis of left leg 05/2013   with cellulitis--resolved approp with abx and ice  . Urinary incontinence   . Urinary urgency   . Vertigo     Patient Active Problem List   Diagnosis Date Noted  . Maxillary sinusitis 02/19/2015  . OSA (obstructive sleep apnea) 12/13/2013  . Hypothyroidism 12/04/2013  . Varicose veins of lower extremities with other complications 70/62/3762  . Insomnia 06/13/2013  . Pulmonary embolism ([place]) 05/29/2013  . Shortness of breath 04/20/2013  . Insulin resistance 11/29/2012  . HTN (hypertension) 11/29/2012  . Vitamin B12 deficiency 11/29/2012  . Chronic pain 07/11/2010  . Chronic venous insufficiency 06/21/2010  . B12 DEFICIENCY 05/10/2010  . IRRITABLE BOWEL SYNDROME 05/10/2010  . MALABSORPTION SYNDROME 05/10/2010  . ESSENTIAL HYPERTENSION 04/12/2010    Past Surgical History:  Procedure Laterality Date  . BILATERAL SHOULDER SURGERY    . BREAST LUMPECTOMY WITH RADIOACTIVE SEED LOCALIZATION Left 11/12/2015   Procedure: LEFT BREAST LUMPECTOMY WITH RADIOACTIVE SEED LOCALIZATION;  Surgeon: Autumn Messing III, MD;  Location: [facility];  Service: General;  Laterality: Left;  . CHOLECYSTECTOMY  1987  . ENDOMETRIAL ABLATION     menorrhagia  . EXTRACORPOREAL SHOCK WAVE LITHOTRIPSY    . FOOT SURGERY  2000   Right tarsel tunnel release  . GASTRIC RESTRICTION SURGERY  remote past  . HERNIA REPAIR   1980's   Diaphragmatic + gastric stapling  . HERNIA REPAIR     Inguinal  . JOINT REPLACEMENT  2009   Right K[facility], [facility]  . NEPHROLITHOTOMY Right 05/31/2013   Procedure: NEPHROLITHOTOMY PERCUTANEOUS;  Surgeon: Irine Seal, MD;  Location: [facility];  Service: Urology;  Laterality: Right;  . PFT's  09/2013; 12/2013    09/2013 mild restriction.  12/2013 Low vital capacity, possibly due to restriction from pt's body habitus.  . TRANSTHORACIC ECHOCARDIOGRAM  12/16/13   EF 83-15%, grade 2 diastolic dysfxn, PA pressure 37 (mildly high)     OB History   None      Home Medications    Prior to Admission medications   Medication Sig Start Date End Date Taking? Authorizing Provider  acetaminophen (TYLENOL) 500 MG tablet Take 1,000 mg by mouth every 6 (six) hours as needed for mild pain or headache.    Yes [provider]  AMLODIPINE BESYLATE PO Take 10 mg by mouth.   Yes [provider]  levothyroxine (SYNTHROID, LEVOTHROID) 200 MCG tablet TAKE ONE TABLET BY MOUTH ONE TIME DAILY before breakfast 11/07/14  Yes McGowen, Adrian Blackwater, MD  losartan (COZAAR) 50 MG tablet Take 50 mg by mouth daily.   Yes [provider]  metFORMIN (GLUCOPHAGE) 500 MG tablet Take 1 tablet (500 mg total) by mouth 2 (two) times daily with a meal. Patient taking differently: Take 500 mg by mouth daily with breakfast.  07/21/14  Yes McGowen, Adrian Blackwater, MD  montelukast (SINGULAIR) 10 MG tablet Take 10 mg by mouth at bedtime.   Yes [provider]  oxyCODONE (ROXICODONE) 15 MG immediate release tablet Take 15 mg by mouth 4 (four) times daily. FOR BACK PAIN AND ARTHRITIS AND RT KNEE PAIN   Yes [provider]  amlodipine-atorvastatin (CADUET) 10-10 MG tablet Take 1 tablet by mouth daily.    [provider]  aspirin EC 81 MG tablet Take 81 mg by mouth every 6 (six) hours as needed for mild pain.  12/02/13   McGowen, Adrian Blackwater, MD  azelastine (ASTELIN) 0.1 % nasal spray Place 1-2 sprays into both nostrils 2 (two) times daily as needed for allergies. Reported on 08/09/2015 11/28/14   [provider]  dicyclomine (BENTYL) 20 MG tablet Take 1 tablet (20 mg total) by mouth 3 (three) times daily before meals. 07/27/16   Orpah Greek, MD  docusate sodium (COLACE) 100 MG capsule Take 1 capsule  (100 mg total) by mouth every 12 (twelve) hours. 07/27/16   Orpah Greek, MD  fluticasone (FLONASE) 50 MCG/ACT nasal spray Place 2 sprays into both nostrils daily. Reported on 08/09/2015 11/28/14   [provider]  furosemide (LASIX) 40 MG tablet Take 1 tablet (40 mg total) by mouth daily. 03/25/16 03/30/16  Leo Grosser, MD  irbesartan (AVAPRO) 300 MG tablet Take 300 mg by mouth daily. 09/28/15   [provider]  irbesartan (AVAPRO) 300 MG tablet Take 300 mg by mouth daily.    [provider]  lactulose (CHRONULAC) 10 GM/15ML solution Take 15 mLs (10 g total) by mouth 2 (two) times daily as needed for mild constipation or moderate constipation. 07/27/16   Orpah Greek, MD  methocarbamol (ROBAXIN) 500 MG tablet Take 1 tablet (500 mg total) by mouth 2 (two) times daily. 08/19/17   Ward, Ozella Almond, PA-C  ondansetron (ZOFRAN) 4 MG tablet Take 4 mg by mouth every 8 (eight) hours as needed for nausea or vomiting.    [provider]  pravastatin (PRAVACHOL) 20 MG tablet Take 20 mg by mouth daily.    [provider]  promethazine (PHENERGAN) 25 MG tablet Take 1 tablet (25 mg total) by mouth every 6 (six) hours as needed for nausea or vomiting. 07/27/16   Pollina, Gwenyth Allegra, MD    Family History Family History  Problem Relation Age of Onset  . Hypertension Mother   . Cancer Mother        Brain/Lung/ smoker  . Diabetes Mother   . Stroke Father   . Hypertension Father   . ADD / ADHD Daughter        ADHD  . Thyroid disease Brother   . Hypertension Brother   . Varicose Veins Brother   . Peripheral vascular disease Brother   . Heart attack Maternal Grandfather   . Alcohol abuse Paternal Grandfather     Social History Social History  Tobacco Use  . Smoking status: Former Smoker    Packs/day: 0.00    Years: 0.00    Pack years: 0.00    Last attempt to quit: 03/31/1998    Years since quitting: 19.4  . Smokeless tobacco: Never  Used  Substance Use Topics  . Alcohol use: Yes    Comment: social  . Drug use: No     Allergies   Gabapentin; Mold extract [trichophyton]; Morphine; and Codeine   Review of Systems Review of Systems  Musculoskeletal: Positive for arthralgias and myalgias.  Neurological: Positive for numbness. Negative for weakness.     Physical Exam Updated Vital Signs BP (!) 151/92 (BP Location: Left Arm)   Pulse 89   Temp 98 F (36.7 C) (Oral)   Resp 18   Ht 5\' 4"  (1.626 m)   Wt (!) 150.1 kg (331 lb)   SpO2 96%   BMI 56.82 kg/m   Physical Exam  Constitutional: She appears well-developed and well-nourished. No distress.  HENT:  Head: Normocephalic and atraumatic.  Neck: Neck supple.  No midline tenderness.  Cardiovascular: Normal rate, regular rhythm and normal heart sounds.  No murmur heard. Pulmonary/Chest: Effort normal and breath sounds normal. No respiratory distress. She has no wheezes. She has no rales.  Musculoskeletal:  Tenderness to palpation of AC joint and posterior aspects of the right shoulder.  Full range of motion of wrist and elbow, however decreased range of motion at the shoulder. No swelling, erythema or ecchymosis present. No step-off, crepitus, or deformity appreciated. 2+ radial pulse, sensation intact and all compartments soft.  Neurological: She is alert.  Skin: Skin is warm and dry.  Nursing note and vitals reviewed.    ED Treatments / Results  Labs (all labs ordered are listed, but only abnormal results are displayed) Labs Reviewed - No data to display  EKG None  Radiology No results found.  Procedures Procedures (including critical care time)  Medications Ordered in ED Medications  ketorolac (TORADOL) 30 MG/ML injection 30 mg (has no administration in time range)     Initial Impression / Assessment and Plan / ED Course  I have reviewed the triage vital signs and the nursing notes.  Pertinent labs & imaging results that were available  during my care of the patient were reviewed by me and considered in my medical decision making (see chart for details).    T[facility] is a 62 y.o. female who presents to ED for right shoulder pain ongoing for several months now.  She saw her orthopedic doctor 2 weeks ago where she received x-rays and cortisone shot.  This has not been helping.  Neurovascularly intact on exam.  Offered repeat x-ray, however doubt this would be of much benefit given she just had this done 2 weeks ago.  Patient agrees and will defer on imaging today. Evaluation does not show pathology that would require ongoing emergent intervention or inpatient treatment.  Ultimately, needs to follow-up with her orthopedic doctor for further evaluation of her ongoing right shoulder pain which she is managing.  Toradol shot in sling provided in the emergency [facility] for comfort.  We will add Robaxin and for couple of days to see if this helps as well.  All questions answered.  Final Clinical Impressions(s) / ED Diagnoses   Final diagnoses:  Right shoulder pain, unspecified chronicity    ED Discharge Orders        Ordered    methocarbamol (ROBAXIN) 500 MG tablet  2 times  daily     08/19/17 1411       Ward, Ozella Almond, PA-C 08/19/17 1419    Virgel Manifold, MD 08/19/17 [id number]

## 2017-08-19 NOTE — Discharge Instructions (Signed)
It was my pleasure taking care of you today!  Ice shoulder throughout the day (instructions below).  Continue your home pain regimen. In addition to this, I have given you a prescription for Robaxin (a muscle relaxer) which you can take twice daily as needed.  Wear shoulder sling for no more than 3 days, then begin performing gentle range of motion exercises.   Call your orthopedic doctor today to schedule a follow up appointment.   Return to the ER for new or worsening symptoms, any additional concerns.  COLD THERAPY DIRECTIONS:  Ice or gel packs can be used to reduce both pain and swelling. Ice is the most helpful within the first 24 to 48 hours after an injury or flareup from overusing a muscle or joint.  Ice is effective, has very few side effects, and is safe for most people to use.   If you expose your skin to cold temperatures for too long or without the proper protection, you can damage your skin or nerves. Watch for signs of skin damage due to cold.   HOME CARE INSTRUCTIONS  Follow these tips to use ice and cold packs safely.  Place a dry or damp towel between the ice and skin. A damp towel will cool the skin more quickly, so you may need to shorten the time that the ice is used.  For a more rapid response, add gentle compression to the ice.  Ice for no more than 10 to 20 minutes at a time. The bonier the area you are icing, the less time it will take to get the benefits of ice.  Check your skin after 5 minutes to make sure there are no signs of a poor response to cold or skin damage.  Rest 20 minutes or more in between uses.  Once your skin is numb, you can end your treatment. You can test numbness by very lightly touching your skin. The touch should be so light that you do not see the skin dimple from the pressure of your fingertip. When using ice, most people will feel these normal sensations in this order: cold, burning, aching, and numbness.

## 2017-08-26 ENCOUNTER — Other Ambulatory Visit: Payer: Self-pay | Admitting: Specialist

## 2017-08-26 DIAGNOSIS — M67911 Unspecified disorder of synovium and tendon, right shoulder: Secondary | ICD-10-CM

## 2017-09-04 ENCOUNTER — Inpatient Hospital Stay
Admission: RE | Admit: 2017-09-04 | Discharge: 2017-09-04 | Disposition: A | Discharge status: Home / Self Care | Payer: BLUE CROSS/BLUE SHIELD | Source: Ambulatory Visit | Attending: Specialist | Admitting: Specialist

## 2018-01-20 IMAGING — CT CT ANGIO CHEST
2 of 8 series · 18 of 36 positions shown · IV contrast (isovue)
Comparison: Chest radiograph performed 02/11/2016, and CTA of the
chest performed 07/16/2015

CLINICAL DATA: Acute onset of shortness of breath. Initial
encounter.

EXAM:
CT ANGIOGRAPHY CHEST WITH CONTRAST
TECHNIQUE: Multidetector CT imaging of the chest was performed using the
standard protocol during bolus administration of intravenous
contrast. Multiplanar CT image reconstructions and MIPs were
obtained to evaluate the vascular anatomy.
CONTRAST:  100 mL of Isovue 370 IV contrast

[Series 6: pe coronal mpr · coronal · 0.58mm/px · 1 of 110 slices shown]
[im 55/110  mediastinal]
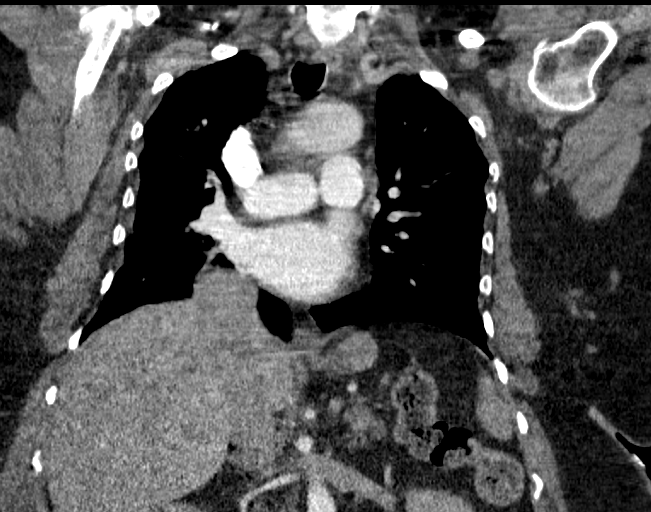

[Series 10: pe thins · axial · 0.80mm/px · z∈[-6,+254]mm · 17 of 387 slices shown]
[im 20/387  lung]
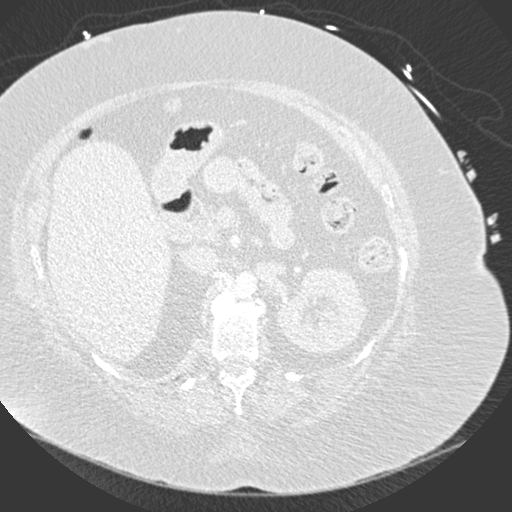
[im 39/387  mediastinal]
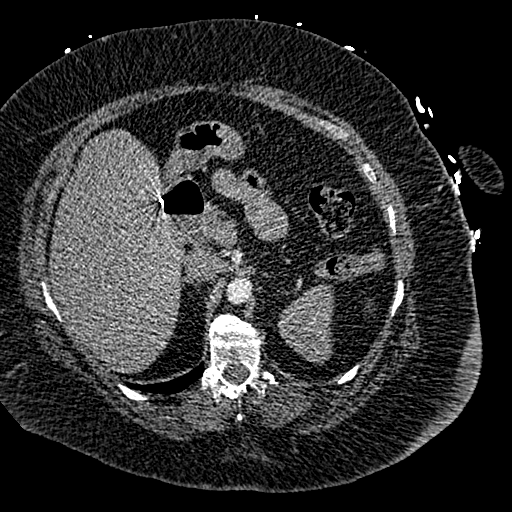
[im 58/387  lung]
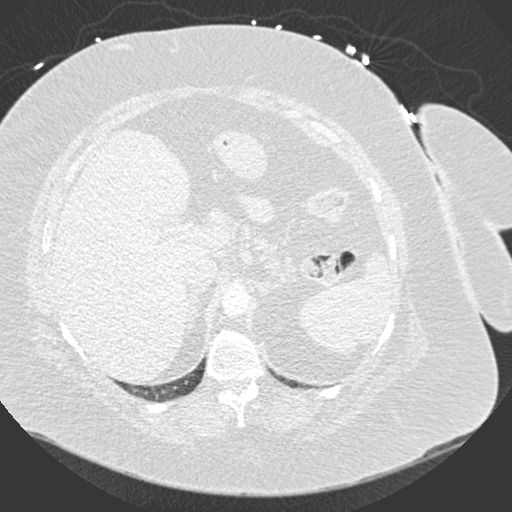
[im 78/387  mediastinal]
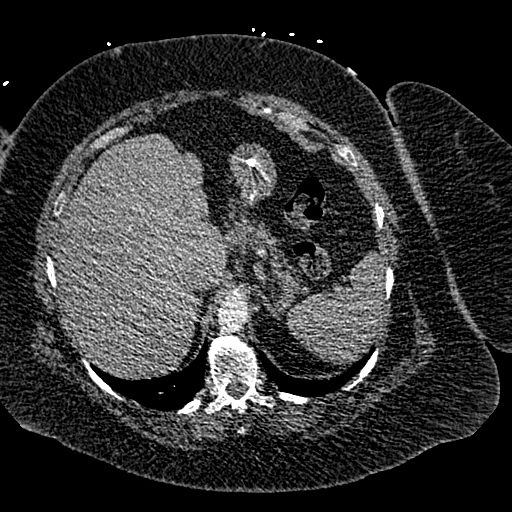
[im 116/387  lung]
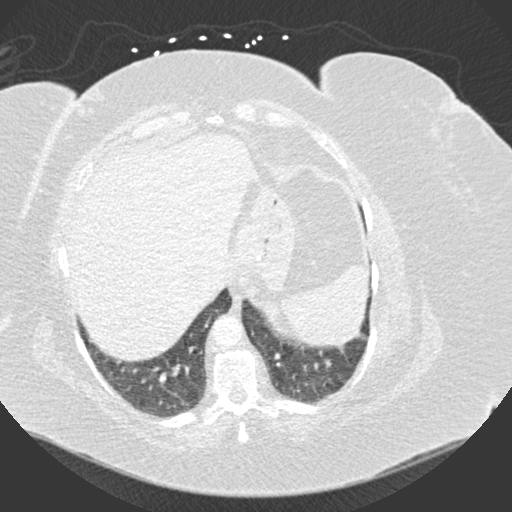
[im 136/387  mediastinal]
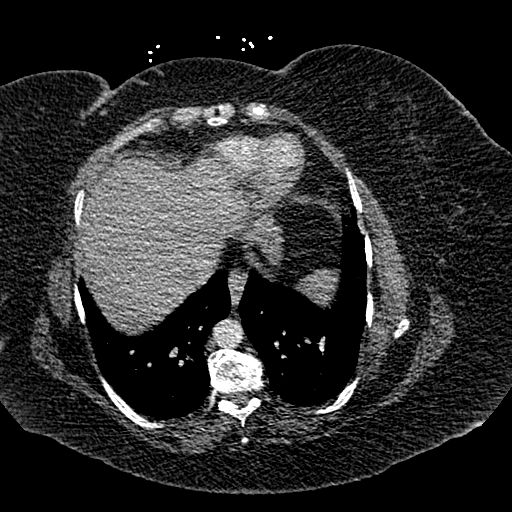
[im 155/387  lung]
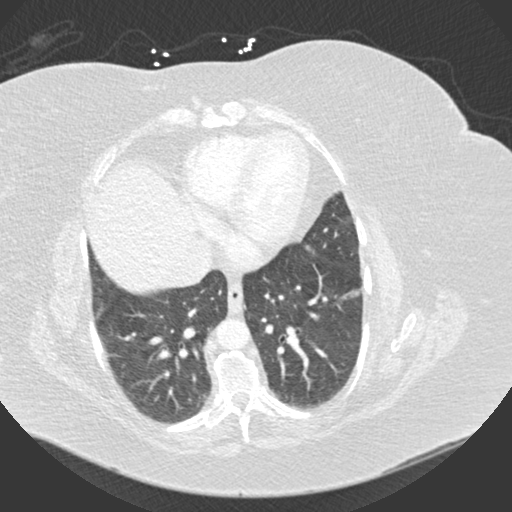
[im 174/387  mediastinal]
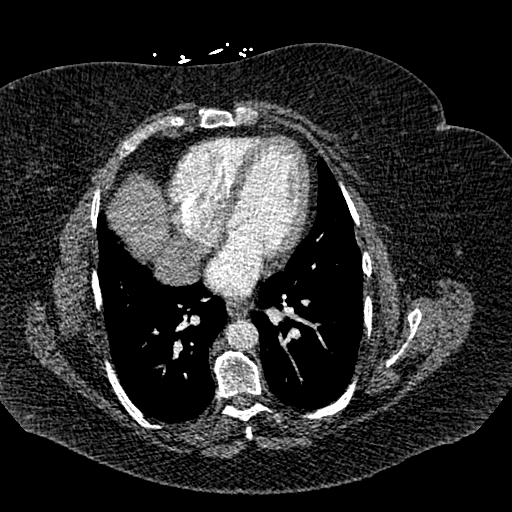
[im 194/387  lung]
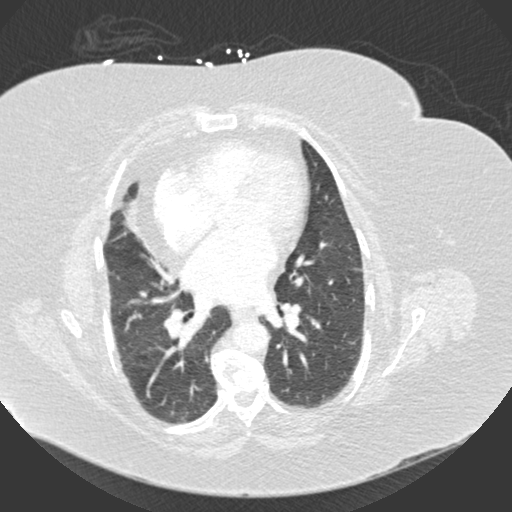
[im 213/387  mediastinal]
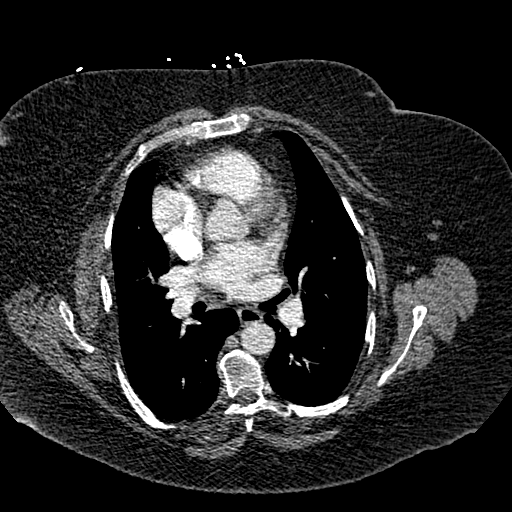
[im 232/387  lung]
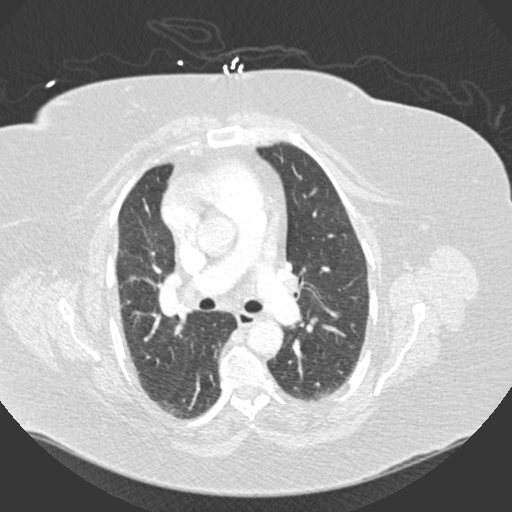
[im 251/387  mediastinal]
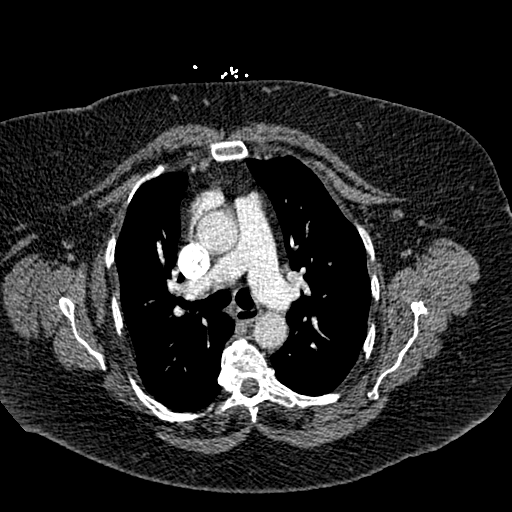
[im 271/387  lung]
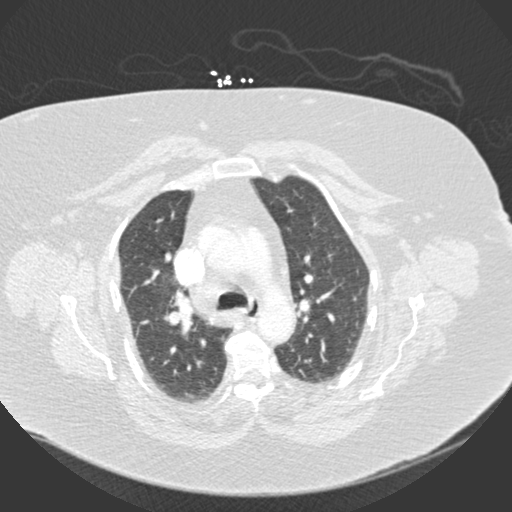
[im 309/387  mediastinal]
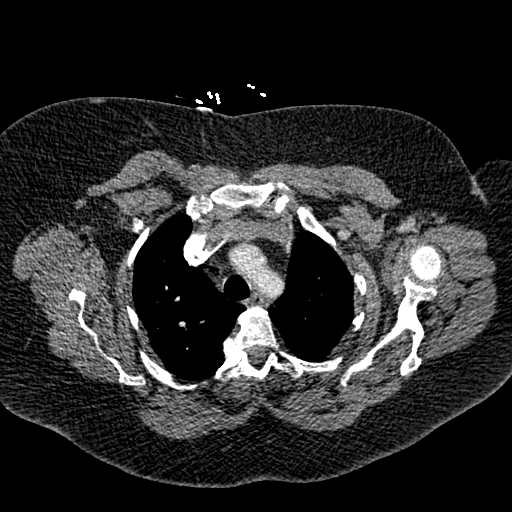
[im 329/387  lung]
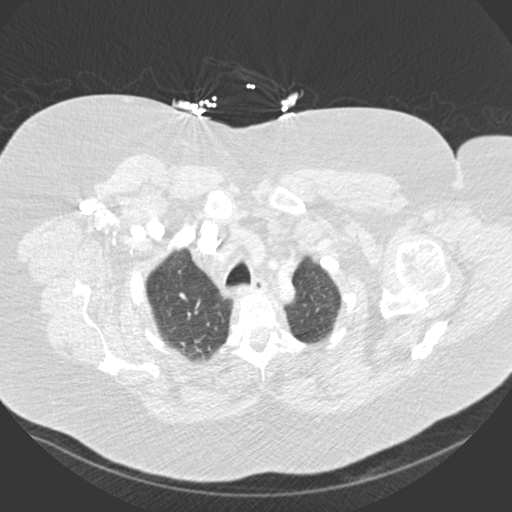
[im 348/387  mediastinal]
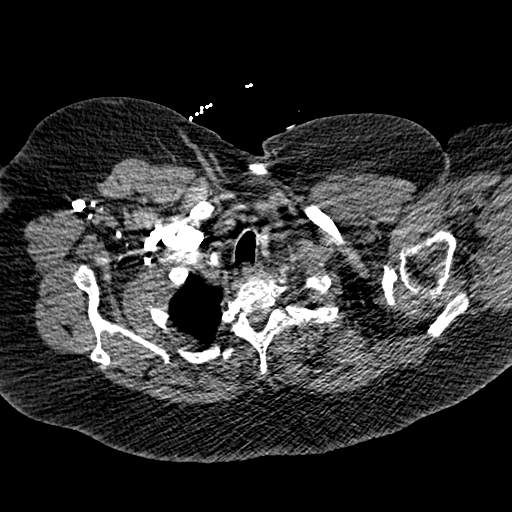
[im 367/387  lung]
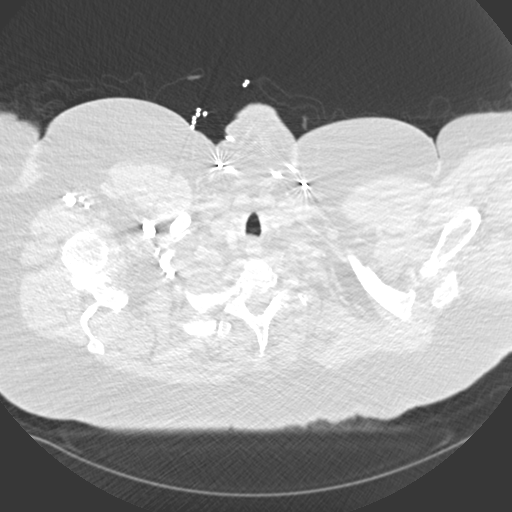

[18 of 36 positions shown; findings below may reference images not displayed]

FINDINGS: Cardiovascular:  There is no evidence of pulmonary embolus.

Scattered coronary artery calcifications are seen. The heart remains
normal in size. The thoracic aorta is unremarkable. The great
vessels are within normal limits.

Mediastinum/Nodes: No mediastinal lymphadenopathy is seen. No
pericardial effusion is identified. The thyroid gland is diminutive
and not well assessed. No axillary lymphadenopathy is seen.

Lungs/Pleura: Minimal bilateral atelectasis is noted. No pleural
effusion or pneumothorax is seen. No masses are identified.

Upper Abdomen: The patient is status post cholecystectomy, with
clips noted along the gallbladder fossa. The visualized portions of
the liver and spleen are unremarkable. The patient is status post
reversal of gastric bypass surgery. The visualized portions of the
pancreas and adrenal glands are within normal limits.

Musculoskeletal: No acute osseous abnormalities are identified. The
visualized musculature is unremarkable in appearance.

Review of the MIP images confirms the above findings.
IMPRESSION: 1. No evidence of pulmonary embolus.
2. Minimal bilateral atelectasis noted.  Lungs otherwise clear.
3. Scattered coronary artery calcifications seen.

## 2018-03-29 ENCOUNTER — Other Ambulatory Visit: Payer: Self-pay | Admitting: Gynecology

## 2018-03-29 ENCOUNTER — Other Ambulatory Visit: Payer: Self-pay | Admitting: Internal Medicine

## 2018-03-29 DIAGNOSIS — Z1231 Encounter for screening mammogram for malignant neoplasm of breast: Secondary | ICD-10-CM

## 2018-04-15 ENCOUNTER — Ambulatory Visit: Payer: BLUE CROSS/BLUE SHIELD

## 2018-05-12 ENCOUNTER — Ambulatory Visit: Payer: BLUE CROSS/BLUE SHIELD

## 2018-06-02 ENCOUNTER — Ambulatory Visit
Admission: RE | Admit: 2018-06-02 | Discharge: 2018-06-02 | Disposition: A | Discharge status: Home / Self Care | Payer: BLUE CROSS/BLUE SHIELD | Source: Ambulatory Visit | Attending: Internal Medicine | Admitting: Internal Medicine

## 2018-06-02 DIAGNOSIS — Z1231 Encounter for screening mammogram for malignant neoplasm of breast: Secondary | ICD-10-CM

## 2018-06-04 ENCOUNTER — Other Ambulatory Visit: Payer: Self-pay | Admitting: Internal Medicine

## 2018-06-04 DIAGNOSIS — R928 Other abnormal and inconclusive findings on diagnostic imaging of breast: Secondary | ICD-10-CM

## 2018-06-09 ENCOUNTER — Ambulatory Visit
Admission: RE | Admit: 2018-06-09 | Discharge: 2018-06-09 | Disposition: A | Discharge status: Home / Self Care | Payer: BLUE CROSS/BLUE SHIELD | Source: Ambulatory Visit | Attending: Internal Medicine | Admitting: Internal Medicine

## 2018-06-09 ENCOUNTER — Other Ambulatory Visit: Payer: Self-pay

## 2018-06-09 DIAGNOSIS — R928 Other abnormal and inconclusive findings on diagnostic imaging of breast: Secondary | ICD-10-CM

## 2018-07-23 ENCOUNTER — Other Ambulatory Visit: Payer: Self-pay | Admitting: Specialist

## 2018-07-23 DIAGNOSIS — M545 Low back pain, unspecified: Secondary | ICD-10-CM

## 2019-03-16 ENCOUNTER — Emergency Department (HOSPITAL_BASED_OUTPATIENT_CLINIC_OR_DEPARTMENT_OTHER): Payer: BLUE CROSS/BLUE SHIELD

## 2019-03-16 ENCOUNTER — Emergency Department (HOSPITAL_BASED_OUTPATIENT_CLINIC_OR_DEPARTMENT_OTHER)
Admission: EM | Admit: 2019-03-16 | Discharge: 2019-03-16 | Disposition: A | Discharge status: Home / Self Care | Payer: BLUE CROSS/BLUE SHIELD | Attending: Emergency Medicine | Admitting: Emergency Medicine

## 2019-03-16 ENCOUNTER — Other Ambulatory Visit: Payer: Self-pay

## 2019-03-16 ENCOUNTER — Encounter (HOSPITAL_BASED_OUTPATIENT_CLINIC_OR_DEPARTMENT_OTHER): Payer: Self-pay | Admitting: *Deleted

## 2019-03-16 DIAGNOSIS — Z79899 Other long term (current) drug therapy: Secondary | ICD-10-CM | POA: Diagnosis not present

## 2019-03-16 DIAGNOSIS — Z7984 Long term (current) use of oral hypoglycemic drugs: Secondary | ICD-10-CM | POA: Insufficient documentation

## 2019-03-16 DIAGNOSIS — M546 Pain in thoracic spine: Secondary | ICD-10-CM | POA: Diagnosis present

## 2019-03-16 DIAGNOSIS — Z87891 Personal history of nicotine dependence: Secondary | ICD-10-CM | POA: Insufficient documentation

## 2019-03-16 DIAGNOSIS — Z96651 Presence of right artificial knee joint: Secondary | ICD-10-CM | POA: Insufficient documentation

## 2019-03-16 DIAGNOSIS — I10 Essential (primary) hypertension: Secondary | ICD-10-CM | POA: Insufficient documentation

## 2019-03-16 DIAGNOSIS — E039 Hypothyroidism, unspecified: Secondary | ICD-10-CM | POA: Diagnosis not present

## 2019-03-16 DIAGNOSIS — E1140 Type 2 diabetes mellitus with diabetic neuropathy, unspecified: Secondary | ICD-10-CM | POA: Insufficient documentation

## 2019-03-16 DIAGNOSIS — J45909 Unspecified asthma, uncomplicated: Secondary | ICD-10-CM | POA: Insufficient documentation

## 2019-03-16 DIAGNOSIS — Z7982 Long term (current) use of aspirin: Secondary | ICD-10-CM | POA: Insufficient documentation

## 2019-03-16 DIAGNOSIS — R0789 Other chest pain: Secondary | ICD-10-CM | POA: Insufficient documentation

## 2019-03-16 LAB — CBC WITH DIFFERENTIAL/PLATELET
Abs Immature Granulocytes: 0.03 10*3/uL (ref 0.00–0.07)
Basophils Absolute: 0.1 10*3/uL (ref 0.0–0.1)
Basophils Relative: 1 %
Eosinophils Absolute: 0.2 10*3/uL (ref 0.0–0.5)
Eosinophils Relative: 2 %
HCT: 45.5 % (ref 36.0–46.0)
Hemoglobin: 14.1 g/dL (ref 12.0–15.0)
Immature Granulocytes: 0 %
Lymphocytes Relative: 22 %
Lymphs Abs: 2 10*3/uL (ref 0.7–4.0)
MCH: 29.7 pg (ref 26.0–34.0)
MCHC: 31 g/dL (ref 30.0–36.0)
MCV: 96 fL (ref 80.0–100.0)
Monocytes Absolute: 0.7 10*3/uL (ref 0.1–1.0)
Monocytes Relative: 7 %
Neutro Abs: 6.2 10*3/uL (ref 1.7–7.7)
Neutrophils Relative %: 68 %
Platelets: 324 10*3/uL (ref 150–400)
RBC: 4.74 MIL/uL (ref 3.87–5.11)
RDW: 14 % (ref 11.5–15.5)
WBC: 9.1 10*3/uL (ref 4.0–10.5)
nRBC: 0 % (ref 0.0–0.2)

## 2019-03-16 LAB — COMPREHENSIVE METABOLIC PANEL
ALT: 24 U/L (ref 0–44)
AST: 25 U/L (ref 15–41)
Albumin: 4.6 g/dL (ref 3.5–5.0)
Alkaline Phosphatase: 107 U/L (ref 38–126)
Anion gap: 9 (ref 5–15)
BUN: 20 mg/dL (ref 8–23)
CO2: 31 mmol/L (ref 22–32)
Calcium: 9.8 mg/dL (ref 8.9–10.3)
Chloride: 97 mmol/L — ABNORMAL LOW (ref 98–111)
Creatinine, Ser: 0.7 mg/dL (ref 0.44–1.00)
GFR calc Af Amer: >60 mL/min (ref >60)
GFR calc non Af Amer: >60 mL/min (ref >60)
Glucose, Bld: 135 mg/dL — ABNORMAL HIGH (ref 70–99)
Potassium: 4.1 mmol/L (ref 3.5–5.1)
Sodium: 137 mmol/L (ref 135–145)
Total Bilirubin: 0.6 mg/dL (ref 0.3–1.2)
Total Protein: 8 g/dL (ref 6.5–8.1)

## 2019-03-16 LAB — TROPONIN I (HIGH SENSITIVITY): Troponin I (High Sensitivity): 3 ng/L (ref <18)

## 2019-03-16 MED ORDER — KETOROLAC TROMETHAMINE 30 MG/ML IJ SOLN
30.0000 mg | Freq: Once | INTRAMUSCULAR | Status: AC
  Administered 2019-03-16: 30 mg via INTRAVENOUS
  Filled 2019-03-16: qty 1

## 2019-03-16 MED ORDER — ONDANSETRON HCL 4 MG/2ML IJ SOLN
4.0000 mg | Freq: Once | INTRAMUSCULAR | Status: AC
  Administered 2019-03-16: 4 mg via INTRAVENOUS
  Filled 2019-03-16: qty 2

## 2019-03-16 MED ORDER — IOHEXOL 350 MG/ML SOLN
100.0000 mL | Freq: Once | INTRAVENOUS | Status: AC | PRN
  Administered 2019-03-16: 100 mL via INTRAVENOUS

## 2019-03-16 MED ORDER — HYDROMORPHONE HCL 1 MG/ML IJ SOLN
2.0000 mg | Freq: Once | INTRAMUSCULAR | Status: AC
  Administered 2019-03-16: 2 mg via INTRAMUSCULAR
  Filled 2019-03-16: qty 2

## 2019-03-16 MED ORDER — SODIUM CHLORIDE 0.9 % IV BOLUS
1000.0000 mL | Freq: Once | INTRAVENOUS | Status: AC
  Administered 2019-03-16: 1000 mL via INTRAVENOUS

## 2019-03-16 MED ORDER — CYCLOBENZAPRINE HCL 10 MG PO TABS: 10.0000 mg | ORAL_TABLET | Freq: Three times a day (TID) | ORAL | 0 refills | Status: DC | PRN

## 2019-03-16 MED ORDER — HYDROMORPHONE HCL 1 MG/ML IJ SOLN
2.0000 mg | Freq: Once | INTRAMUSCULAR | Status: DC
  Filled 2019-03-16: qty 2

## 2019-03-16 NOTE — ED Provider Notes (Signed)
[facility] EMERGENCY DEPARTMENT Provider Note   CSN: [id number] Arrival date & time: 03/16/19  2025     History Chief Complaint  Patient presents with  . Back Pain    Tniyah TARRIE ZALUSKI is a 63 y.o. female.  Patient is a 63 year old female with past medical history of asthma, arthritis, chronic venous insufficiency, type 2 diabetes, morbid obesity, GERD, fibromyalgia.  She presents today for evaluation of severe pain in her right back and flank that started a weeks ago, but worsened suddenly this evening.  She describes a burning pain that is worse when she tries to move or lay flat.  She denies any difficulty breathing, fevers, or chills.  The history is provided by the patient.  Back Pain Location:  Thoracic spine Pain severity:  Severe Onset quality:  Sudden Duration:  2 hours Timing:  Constant Progression:  Worsening Chronicity:  New Relieved by:  Nothing Worsened by:  Lying down and palpation Ineffective treatments:  None tried      Past Medical History:  Diagnosis Date  . Arthritis    Knees; right-TKR, left-bone on bone/end stage  . Asthma   . Carpal tunnel syndrome on both sides 06/21/2010  . Carpal tunnel syndrome, bilateral   . Chronic nonallergic rhinitis    allergy eval NEG 10/2014: azelastine and flonase spray rx'd  . Chronic venous insufficiency    with edema  and extensive varicose dz: Dr. Linus Mako is managing this, planning laser procedures.; A LOT OF LEG CRAMPS AND LEG STIFFNESS  . Complication of anesthesia    STATES SHE WAS TOLD SHE WOKE UP DURING HER KNEE REPLACEMENT SURGERY AND HAD TO BE GIVEN MORE MEDICINE - NOT SURE IF SPINAL OR GENERAL ANESTHESIA  . DDD (degenerative disc disease)    Dr. [name] evaluated her and recommended pain mgmt MD.  She saw Dr. Selinda Orion for pain mgmt at one time.  . Diabetes mellitus without complication (HCC)    Type II  . Disequilibrium syndrome    MRI brain normal 2012  . DIZZINESS 04/12/2010   Qualifier:  Diagnosis of  By: Anitra Lauth M.D., Brien Few   . Fibromyalgia    questionable  . GERD (gastroesophageal reflux disease)   . Heart murmur    functional, w/u done; PALPITATIONS IN THE PAST  . History of bronchitis   . History of palpitations   . History of pulmonary embolism 05/2013   Right sided.  Setting: post-op percutaneous nephrolithotomy--xarelto x 6 mo  . Hypertension   . Hypothyroidism   . Insomnia   . Insulin resistance   . Kidney stone on right side 12/2014   Dr. Jeffie Pollock considering PCNL, ESWL, and ureteroscopy as of 01/25/15   . Microscopic hematuria 2014   CT abd pelv showed 1.8 cm right renal pelvis stone.  Also had UA c/w UTI so abx given.   . Mixed incontinence urge and stress    and nocturia x 5-6 per night  . Morbid obesity (HCC)    BMI 49.  Gastric stapling surgery in distant past.  . Nephrolithiasis    Lithotripsy 2002.  Right percut nephrolith 05/2013.  Marland Kitchen Nocturia   . Numbness and tingling    bilateral hands and feet  . OAB (overactive bladder)    Per Dr. Jeffie Pollock: pt declined pelvic floor PT.  Vesicare trial started 01/25/15.  . Obesity hypoventilation syndrome ([place])   . Peripheral neuropathy    No old records to confirm pt's report.  Pt refuses to have more  NCS b/c test is painful.  Marland Kitchen PONV (postoperative nausea and vomiting)   . Shortness of breath    CHRONIC; 3 mo trial off of ACE-I made no difference.  Spirometry x 2 has shown no obstruction.  Allergy w/u NEG.  . Shoulder pain    HX OF BILATERAL SHOULDER SURGERIES - PT HAS VERY LIMITED ROM - ESPECIALLY RAISING HER ARMS  . Sleep apnea    PT DOES NOT HAVE MASK OR TUBING - JUST SLEEPS WITH HEAD ELEVATED ON 2 PILLOWS   . Spinal stenosis    with L5 radiculopathy, also pseudospondylolisthesis per Dr. [name]  . Superficial thrombophlebitis of left leg 05/2013   with cellulitis--resolved approp with abx and ice  . Urinary incontinence   . Urinary urgency   . Vertigo     Patient Active Problem List   Diagnosis Date  Noted  . Maxillary sinusitis 02/19/2015  . OSA (obstructive sleep apnea) 12/13/2013  . Hypothyroidism 12/04/2013  . Varicose veins of lower extremities with other complications [id number]  . Insomnia 06/13/2013  . Pulmonary embolism ([place]) 05/29/2013  . Shortness of breath 04/20/2013  . Insulin resistance 11/29/2012  . HTN (hypertension) 11/29/2012  . Vitamin B12 deficiency 11/29/2012  . Chronic pain 07/11/2010  . Chronic venous insufficiency 06/21/2010  . B12 DEFICIENCY 05/10/2010  . IRRITABLE BOWEL SYNDROME 05/10/2010  . MALABSORPTION SYNDROME 05/10/2010  . ESSENTIAL HYPERTENSION 04/12/2010    Past Surgical History:  Procedure Laterality Date  . BILATERAL SHOULDER SURGERY    . BREAST EXCISIONAL BIOPSY Left 11/12/2015  . BREAST LUMPECTOMY WITH RADIOACTIVE SEED LOCALIZATION Left 11/12/2015   Procedure: LEFT BREAST LUMPECTOMY WITH RADIOACTIVE SEED LOCALIZATION;  Surgeon: Autumn Messing III, MD;  Location: [facility];  Service: General;  Laterality: Left;  . CHOLECYSTECTOMY  1987  . ENDOMETRIAL ABLATION     menorrhagia  . EXTRACORPOREAL SHOCK WAVE LITHOTRIPSY    . FOOT SURGERY  2000   Right tarsel tunnel release  . GASTRIC RESTRICTION SURGERY  remote past  . HERNIA REPAIR   1980's   Diaphragmatic + gastric stapling  . HERNIA REPAIR     Inguinal  . JOINT REPLACEMENT  2009   Right Knee, [facility]  . NEPHROLITHOTOMY Right 05/31/2013   Procedure: NEPHROLITHOTOMY PERCUTANEOUS;  Surgeon: Irine Seal, MD;  Location: [facility];  Service: Urology;  Laterality: Right;  . PFT's  09/2013; 12/2013   09/2013 mild restriction.  12/2013 Low vital capacity, possibly due to restriction from pt's body habitus.  . TRANSTHORACIC ECHOCARDIOGRAM  12/16/13   EF [id number], grade 2 diastolic dysfxn, PA pressure 37 (mildly high)     OB History   No obstetric history on file.     Family History  Problem Relation Age of Onset  . Hypertension Mother   . Cancer Mother        Brain/Lung/ smoker  . Diabetes Mother     . Stroke Father   . Hypertension Father   . ADD / ADHD Daughter        ADHD  . Thyroid disease Brother   . Hypertension Brother   . Varicose Veins Brother   . Peripheral vascular disease Brother   . Heart attack Maternal Grandfather   . Alcohol abuse Paternal Grandfather   . Breast cancer Maternal Grandmother   . Breast cancer Paternal Aunt   . Breast cancer Paternal Aunt     Social History   Tobacco Use  . Smoking status: Former Smoker    Packs/day: 0.00  Years: 0.00    Pack years: 0.00    Quit date: 03/31/1998    Years since quitting: 20.9  . Smokeless tobacco: Never Used  Substance Use Topics  . Alcohol use: Yes    Comment: social  . Drug use: No    Home Medications Prior to Admission medications   Medication Sig Start Date End Date Taking? Authorizing Provider  acetaminophen (TYLENOL) 500 MG tablet Take 1,000 mg by mouth every 6 (six) hours as needed for mild pain or headache.     [provider]  AMLODIPINE BESYLATE PO Take 10 mg by mouth.    [provider]  amlodipine-atorvastatin (CADUET) 10-10 MG tablet Take 1 tablet by mouth daily.    [provider]  aspirin EC 81 MG tablet Take 81 mg by mouth every 6 (six) hours as needed for mild pain.  12/02/13   McGowen, Adrian Blackwater, MD  azelastine (ASTELIN) 0.1 % nasal spray Place 1-2 sprays into both nostrils 2 (two) times daily as needed for allergies. Reported on 08/09/2015 11/28/14   [provider]  dicyclomine (BENTYL) 20 MG tablet Take 1 tablet (20 mg total) by mouth 3 (three) times daily before meals. 07/27/16   Orpah Greek, MD  docusate sodium (COLACE) 100 MG capsule Take 1 capsule (100 mg total) by mouth every 12 (twelve) hours. 07/27/16   Orpah Greek, MD  fluticasone (FLONASE) 50 MCG/ACT nasal spray Place 2 sprays into both nostrils daily. Reported on 08/09/2015 11/28/14   [provider]  furosemide (LASIX) 40 MG tablet Take 1 tablet (40 mg total) by mouth  daily. 03/25/16 03/30/16  Leo Grosser, MD  irbesartan (AVAPRO) 300 MG tablet Take 300 mg by mouth daily. 09/28/15   [provider]  irbesartan (AVAPRO) 300 MG tablet Take 300 mg by mouth daily.    [provider]  lactulose (CHRONULAC) 10 GM/15ML solution Take 15 mLs (10 g total) by mouth 2 (two) times daily as needed for mild constipation or moderate constipation. 07/27/16   Orpah Greek, MD  levothyroxine (SYNTHROID, LEVOTHROID) 200 MCG tablet TAKE ONE TABLET BY MOUTH ONE TIME DAILY before breakfast 11/07/14   McGowen, Adrian Blackwater, MD  losartan (COZAAR) 50 MG tablet Take 50 mg by mouth daily.    [provider]  metFORMIN (GLUCOPHAGE) 500 MG tablet Take 1 tablet (500 mg total) by mouth 2 (two) times daily with a meal. Patient taking differently: Take 500 mg by mouth daily with breakfast.  07/21/14   McGowen, Adrian Blackwater, MD  methocarbamol (ROBAXIN) 500 MG tablet Take 1 tablet (500 mg total) by mouth 2 (two) times daily. 08/19/17   Ward, Ozella Almond, PA-C  montelukast (SINGULAIR) 10 MG tablet Take 10 mg by mouth at bedtime.    [provider]  ondansetron (ZOFRAN) 4 MG tablet Take 4 mg by mouth every 8 (eight) hours as needed for nausea or vomiting.    [provider]  oxyCODONE (ROXICODONE) 15 MG immediate release tablet Take 15 mg by mouth 4 (four) times daily. FOR BACK PAIN AND ARTHRITIS AND RT KNEE PAIN    [provider]  pravastatin (PRAVACHOL) 20 MG tablet Take 20 mg by mouth daily.    [provider]  promethazine (PHENERGAN) 25 MG tablet Take 1 tablet (25 mg total) by mouth every 6 (six) hours as needed for nausea or vomiting. 07/27/16   Pollina, Gwenyth Allegra, MD    Allergies    Gabapentin, Mold extract [trichophyton], Morphine, and  Codeine  Review of Systems   Review of Systems  Musculoskeletal: Positive for back pain.  All other systems reviewed and are negative.   Physical Exam Updated Vital Signs BP (!) 158/100    Pulse (!) 111   Resp 18   Ht 5\' 4"  (1.626 m)   Wt (!) 147.4 kg   SpO2 100%   BMI 55.79 kg/m   Physical Exam Vitals and nursing note reviewed.  Constitutional:      General: She is not in acute distress.    Appearance: She is well-developed. She is not diaphoretic.  HENT:     Head: Normocephalic and atraumatic.  Cardiovascular:     Rate and Rhythm: Normal rate and regular rhythm.     Heart sounds: No murmur. No friction rub. No gallop.   Pulmonary:     Effort: Pulmonary effort is normal. No respiratory distress.     Breath sounds: Normal breath sounds. No wheezing.  Abdominal:     General: Bowel sounds are normal. There is no distension.     Palpations: Abdomen is soft.     Tenderness: There is no abdominal tenderness.  Musculoskeletal:        General: Normal range of motion.     Cervical back: Normal range of motion and neck supple.     Right lower leg: Edema present.     Left lower leg: Edema present.     Comments: There is tenderness to palpation in the right flank and right lower posterior rib area.  There is no crepitus or palpable abnormality.  There is no rash.  There is signs of venous stasis in both lower extremities.  Skin:    General: Skin is warm and dry.  Neurological:     Mental Status: She is alert and oriented to person, place, and time.     ED Results / Procedures / Treatments   Labs (all labs ordered are listed, but only abnormal results are displayed) Labs Reviewed  COMPREHENSIVE METABOLIC PANEL  CBC WITH DIFFERENTIAL/PLATELET  TROPONIN I (HIGH SENSITIVITY)    EKG EKG Interpretation  Date/Time:  Wednesday March 16 2019 20:57:25 EST Ventricular Rate:  115 PR Interval:    QRS Duration: 90 QT Interval:  305 QTC Calculation: 422 R Axis:   -31 Text Interpretation: Sinus tachycardia Abnormal R-wave progression, early transition Left ventricular hypertrophy Baseline wander in lead(s) II aVF Confirmed by Veryl Speak [phone]) on 03/16/2019  9:05:17 PM   Radiology No results found.  Procedures Procedures (including critical care time)  Medications Ordered in ED Medications  sodium chloride 0.9 % bolus 1,000 mL (has no administration in time range)  ondansetron (ZOFRAN) injection 4 mg (has no administration in time range)  HYDROmorphone (DILAUDID) injection 2 mg (has no administration in time range)  ketorolac (TORADOL) 30 MG/ML injection 30 mg (has no administration in time range)    ED Course  I have reviewed the triage vital signs and the nursing notes.  Pertinent labs & imaging results that were available during my care of the patient were reviewed by me and considered in my medical decision making (see chart for details).  Patient presenting here with complaints of pain in her right posterior thoracic region that is been sore all week, became much worse this evening.  She describes a burning/tearing pain in this area that is worse when she moves.  I suspect her pain is musculoskeletal as nothing in her work-up has proven to be abnormal.  At this point, I  feel as though patient is appropriate for discharge.  She is to continue the Percocet she takes at home.  I will add a muscle relaxer which she can take as well.  She is to follow-up with primary doctor if not improving.  MDM Rules/Calculators/A&P  Final Clinical Impression(s) / ED Diagnoses Final diagnoses:  None    Rx / DC Orders ED Discharge Orders    None       Veryl Speak, MD 03/16/19 2240

## 2019-03-16 NOTE — Discharge Instructions (Addendum)
Begin taking Flexeril as prescribed as needed for pain.  Continue your oxycodone as previously prescribed as needed for pain.  Follow-up with your primary doctor if not improving in the next few days.

## 2019-03-16 NOTE — ED Triage Notes (Signed)
Pt c/o right upper back pain x 1 week, described as burning pain . Pt helped out of car by staff, pt drove self

## 2019-09-27 ENCOUNTER — Other Ambulatory Visit: Payer: Self-pay | Admitting: Orthopedic Surgery

## 2019-09-27 DIAGNOSIS — M542 Cervicalgia: Secondary | ICD-10-CM

## 2019-10-31 ENCOUNTER — Other Ambulatory Visit: Payer: Self-pay

## 2019-10-31 ENCOUNTER — Ambulatory Visit
Admission: RE | Admit: 2019-10-31 | Discharge: 2019-10-31 | Disposition: A | Discharge status: Home / Self Care | Payer: Medicare Other | Source: Ambulatory Visit | Attending: Orthopedic Surgery | Admitting: Orthopedic Surgery

## 2019-10-31 DIAGNOSIS — M542 Cervicalgia: Secondary | ICD-10-CM

## 2019-12-06 ENCOUNTER — Telehealth (HOSPITAL_COMMUNITY): Payer: Self-pay

## 2019-12-06 NOTE — Telephone Encounter (Signed)
Manuela Schwartz from [facility] called to schedule cervical nerve root block on this patient. Called over to the reading room to ask one of the spine doctors if this could be done. Per our radiologist, none of our docs do cervical nerve blocks. Called Manuela Schwartz back to inform her of this, no answer, left vm. AW

## 2020-04-21 ENCOUNTER — Encounter (HOSPITAL_BASED_OUTPATIENT_CLINIC_OR_DEPARTMENT_OTHER): Payer: Self-pay | Admitting: Emergency Medicine

## 2020-04-21 ENCOUNTER — Emergency Department (HOSPITAL_BASED_OUTPATIENT_CLINIC_OR_DEPARTMENT_OTHER): Payer: Medicare Other

## 2020-04-21 ENCOUNTER — Other Ambulatory Visit: Payer: Self-pay

## 2020-04-21 ENCOUNTER — Emergency Department (HOSPITAL_BASED_OUTPATIENT_CLINIC_OR_DEPARTMENT_OTHER)
Admission: EM | Admit: 2020-04-21 | Discharge: 2020-04-21 | Disposition: A | Discharge status: Home / Self Care | Payer: Medicare Other | Attending: Emergency Medicine | Admitting: Emergency Medicine

## 2020-04-21 DIAGNOSIS — R0602 Shortness of breath: Secondary | ICD-10-CM | POA: Diagnosis present

## 2020-04-21 DIAGNOSIS — Z7982 Long term (current) use of aspirin: Secondary | ICD-10-CM | POA: Diagnosis not present

## 2020-04-21 DIAGNOSIS — I10 Essential (primary) hypertension: Secondary | ICD-10-CM | POA: Insufficient documentation

## 2020-04-21 DIAGNOSIS — Z87891 Personal history of nicotine dependence: Secondary | ICD-10-CM | POA: Diagnosis not present

## 2020-04-21 DIAGNOSIS — Z20822 Contact with and (suspected) exposure to covid-19: Secondary | ICD-10-CM | POA: Insufficient documentation

## 2020-04-21 DIAGNOSIS — G4733 Obstructive sleep apnea (adult) (pediatric): Secondary | ICD-10-CM | POA: Insufficient documentation

## 2020-04-21 DIAGNOSIS — Z96651 Presence of right artificial knee joint: Secondary | ICD-10-CM | POA: Diagnosis not present

## 2020-04-21 DIAGNOSIS — J9621 Acute and chronic respiratory failure with hypoxia: Secondary | ICD-10-CM | POA: Diagnosis not present

## 2020-04-21 DIAGNOSIS — E039 Hypothyroidism, unspecified: Secondary | ICD-10-CM | POA: Diagnosis not present

## 2020-04-21 DIAGNOSIS — J45909 Unspecified asthma, uncomplicated: Secondary | ICD-10-CM | POA: Insufficient documentation

## 2020-04-21 DIAGNOSIS — E119 Type 2 diabetes mellitus without complications: Secondary | ICD-10-CM | POA: Diagnosis not present

## 2020-04-21 DIAGNOSIS — Z79899 Other long term (current) drug therapy: Secondary | ICD-10-CM | POA: Diagnosis not present

## 2020-04-21 DIAGNOSIS — Z7984 Long term (current) use of oral hypoglycemic drugs: Secondary | ICD-10-CM | POA: Insufficient documentation

## 2020-04-21 DIAGNOSIS — R0902 Hypoxemia: Secondary | ICD-10-CM | POA: Diagnosis present

## 2020-04-21 LAB — D-DIMER, QUANTITATIVE: D-Dimer, Quant: 0.61 ug/mL-FEU — ABNORMAL HIGH (ref 0.00–0.50)

## 2020-04-21 LAB — COMPREHENSIVE METABOLIC PANEL
ALT: 22 U/L (ref 0–44)
AST: 23 U/L (ref 15–41)
Albumin: 4 g/dL (ref 3.5–5.0)
Alkaline Phosphatase: 103 U/L (ref 38–126)
Anion gap: 11 (ref 5–15)
BUN: 9 mg/dL (ref 8–23)
CO2: 31 mmol/L (ref 22–32)
Calcium: 9.5 mg/dL (ref 8.9–10.3)
Chloride: 95 mmol/L — ABNORMAL LOW (ref 98–111)
Creatinine, Ser: 0.5 mg/dL (ref 0.44–1.00)
GFR, Estimated: >60 mL/min (ref >60)
Glucose, Bld: 124 mg/dL — ABNORMAL HIGH (ref 70–99)
Potassium: 4.4 mmol/L (ref 3.5–5.1)
Sodium: 137 mmol/L (ref 135–145)
Total Bilirubin: 0.4 mg/dL (ref 0.3–1.2)
Total Protein: 7.6 g/dL (ref 6.5–8.1)

## 2020-04-21 LAB — CBC WITH DIFFERENTIAL/PLATELET
Abs Immature Granulocytes: 0.04 10*3/uL (ref 0.00–0.07)
Basophils Absolute: 0 10*3/uL (ref 0.0–0.1)
Basophils Relative: 0 %
Eosinophils Absolute: 0.2 10*3/uL (ref 0.0–0.5)
Eosinophils Relative: 2 %
HCT: 45.9 % (ref 36.0–46.0)
Hemoglobin: 13.8 g/dL (ref 12.0–15.0)
Immature Granulocytes: 1 %
Lymphocytes Relative: 18 %
Lymphs Abs: 1.3 10*3/uL (ref 0.7–4.0)
MCH: 29.9 pg (ref 26.0–34.0)
MCHC: 30.1 g/dL (ref 30.0–36.0)
MCV: 99.6 fL (ref 80.0–100.0)
Monocytes Absolute: 0.6 10*3/uL (ref 0.1–1.0)
Monocytes Relative: 8 %
Neutro Abs: 4.9 10*3/uL (ref 1.7–7.7)
Neutrophils Relative %: 71 %
Platelets: 299 10*3/uL (ref 150–400)
RBC: 4.61 MIL/uL (ref 3.87–5.11)
RDW: 14.8 % (ref 11.5–15.5)
WBC: 6.9 10*3/uL (ref 4.0–10.5)
nRBC: 0 % (ref 0.0–0.2)

## 2020-04-21 LAB — SARS CORONAVIRUS 2 BY RT PCR (HOSPITAL ORDER, PERFORMED IN ~~LOC~~ HOSPITAL LAB): SARS Coronavirus 2: NEGATIVE

## 2020-04-21 LAB — TROPONIN I (HIGH SENSITIVITY): Troponin I (High Sensitivity): 4 ng/L (ref <18)

## 2020-04-21 LAB — LACTIC ACID, PLASMA: Lactic Acid, Venous: 1.3 mmol/L (ref 0.5–1.9)

## 2020-04-21 LAB — BRAIN NATRIURETIC PEPTIDE: B Natriuretic Peptide: 31.9 pg/mL (ref 0.0–100.0)

## 2020-04-21 MED ORDER — IPRATROPIUM-ALBUTEROL 0.5-2.5 (3) MG/3ML IN SOLN
3.0000 mL | Freq: Once | RESPIRATORY_TRACT | Status: AC
  Administered 2020-04-21: 3 mL via RESPIRATORY_TRACT
  Filled 2020-04-21: qty 3

## 2020-04-21 MED ORDER — PREDNISONE 10 MG PO TABS: 40.0000 mg | ORAL_TABLET | Freq: Every day | ORAL | 0 refills | Status: AC

## 2020-04-21 MED ORDER — METHYLPREDNISOLONE SODIUM SUCC 125 MG IJ SOLR
125.0000 mg | Freq: Once | INTRAMUSCULAR | Status: AC
  Administered 2020-04-21: 125 mg via INTRAVENOUS
  Filled 2020-04-21: qty 2

## 2020-04-21 NOTE — ED Notes (Signed)
RA O2 sats 78-83% at rest; pt required 4L O2 while ambulating to maintain sats between 90-93%

## 2020-04-21 NOTE — Plan of Care (Signed)
65 year old female with OSA, COPD, possible obesity hypoventilation and pulmonary hypertension was seen at [facility] for ongoing hypoxia.  Patient started becoming hypoxic 3 to 4 weeks ago per notes under care everywhere.  Her PCP was following her, ordered a CTA and PE was ruled out and there was some discussion about starting her on home oxygen.  Patient subsequently felt worse so it was in [place] ED a couple of days ago where they wanted to admit her.  Patient however left AGAINST MEDICAL ADVICE because she thought she would do okay at home.  Patient presented to [facility] with ongoing DOE and SOB at rest.  Patient was discussed with pulmonary who are recommending admission for further work-up of likely pulmonary hypertension and possible obesity hypoventilation syndrome.

## 2020-04-21 NOTE — ED Notes (Signed)
Spoke with RN about the requirements for home 02.  RA sat just sitting was 78%. Placed patient on 2L initially for walk. SAT 84%, 3L SAT 89%, 4L SAT 90-93%. Patient unable to walk far due to being SOB and limited mobility. Explained my concerns to patient and she stated she understood, but wants to go home anyway. RN present with me for walk.

## 2020-04-21 NOTE — ED Notes (Signed)
spO2 93% on 4LPM via [place] when moving from lowered stretcher to [facility]

## 2020-04-21 NOTE — ED Triage Notes (Signed)
Pt arrives pov with driver, unable to ambulate with c/o shob/ Pt 78% on RA. Placed on 4L [place], o2 sats improved to 99%

## 2020-04-21 NOTE — ED Provider Notes (Signed)
[place] EMERGENCY DEPARTMENT Provider Note   CSN: [id number] Arrival date & time: 04/21/20  1114     History Chief Complaint  Patient presents with  . Shortness of Breath    Tricia Perry is a 65 y.o. female.  HPI     65 year old female with a history of hypertension, diabetes, morbid obesity, recent evaluation at [facility] due to hypoxia who left AMA presents with concern for shortness of breath.  At [facility], she had saturations in the 70s on room air, cardiomegaly on x-ray without other specific abnormalities, negative COVID testing, negative CT PE study from earlier in the day.  The plan was to admit her to the hospital for further care, however she left AGAINST MEDICAL ADVICE.  PE study from January 20 showed no evidence of large central pulmonary embolus, however limited evaluations of the lower lobe branches due to persistent respiratory motion artifact and lungs did not show signs of abnormalities, no pericardial effusion.   3 weeks ago had Booster 3 weeks ago O2 levels in the 90s  A few days ago breathing began getting worse, oxygen levels going up and down over the last few days, down to the 80s Saw   In the past has had some oxygen issues, ashtma, bronchitis, weight, symptoms getting worse.  PCP had been working on getting O2 but has not The last few days has been getting significantly worse, the way she would feel when bending over, feeling weak, severe fatigue Wakes up at night short of breath Some coughing dry cough Feels like she has been wheezing   Past Medical History:  Diagnosis Date  . Arthritis    Knees; right-TKR, left-bone on bone/end stage  . Asthma   . Carpal tunnel syndrome on both sides 06/21/2010  . Carpal tunnel syndrome, bilateral   . Chronic nonallergic rhinitis    allergy eval NEG 10/2014: azelastine and flonase spray rx'd  . Chronic venous insufficiency    with edema  and extensive varicose dz: Dr. Linus Mako is managing this,  planning laser procedures.; A LOT OF LEG CRAMPS AND LEG STIFFNESS  . Complication of anesthesia    STATES SHE WAS TOLD SHE WOKE UP DURING HER KNEE REPLACEMENT SURGERY AND HAD TO BE GIVEN MORE MEDICINE - NOT SURE IF SPINAL OR GENERAL ANESTHESIA  . DDD (degenerative disc disease)    Dr. [name] evaluated her and recommended pain mgmt MD.  She saw Dr. Selinda Orion for pain mgmt at one time.  . Diabetes mellitus without complication (HCC)    Type II  . Disequilibrium syndrome    MRI brain normal 2012  . DIZZINESS 04/12/2010   Qualifier: Diagnosis of  By: Anitra Lauth M.D., Brien Few   . Fibromyalgia    questionable  . GERD (gastroesophageal reflux disease)   . Heart murmur    functional, w/u done; PALPITATIONS IN THE PAST  . History of bronchitis   . History of palpitations   . History of pulmonary embolism 05/2013   Right sided.  Setting: post-op percutaneous nephrolithotomy--xarelto x 6 mo  . Hypertension   . Hypothyroidism   . Insomnia   . Insulin resistance   . Kidney stone on right side 12/2014   Dr. Jeffie Pollock considering PCNL, ESWL, and ureteroscopy as of 01/25/15   . Microscopic hematuria 2014   CT abd pelv showed 1.8 cm right renal pelvis stone.  Also had UA c/w UTI so abx given.   . Mixed incontinence urge and stress    and  nocturia x 5-6 per night  . Morbid obesity (HCC)    BMI 49.  Gastric stapling surgery in distant past.  . Nephrolithiasis    Lithotripsy 2002.  Right percut nephrolith 05/2013.  Marland Kitchen Nocturia   . Numbness and tingling    bilateral hands and feet  . OAB (overactive bladder)    Per Dr. Jeffie Pollock: pt declined pelvic floor PT.  Vesicare trial started 01/25/15.  . Obesity hypoventilation syndrome ([place])   . Peripheral neuropathy    No old records to confirm pt's report.  Pt refuses to have more NCS b/c test is painful.  Marland Kitchen PONV (postoperative nausea and vomiting)   . Shortness of breath    CHRONIC; 3 mo trial off of ACE-I made no difference.  Spirometry x 2 has shown no  obstruction.  Allergy w/u NEG.  . Shoulder pain    HX OF BILATERAL SHOULDER SURGERIES - PT HAS VERY LIMITED ROM - ESPECIALLY RAISING HER ARMS  . Sleep apnea    PT DOES NOT HAVE MASK OR TUBING - JUST SLEEPS WITH HEAD ELEVATED ON 2 PILLOWS   . Spinal stenosis    with L5 radiculopathy, also pseudospondylolisthesis per Dr. [name]  . Superficial thrombophlebitis of left leg 05/2013   with cellulitis--resolved approp with abx and ice  . Urinary incontinence   . Urinary urgency   . Vertigo     Patient Active Problem List   Diagnosis Date Noted  . Hypoxia 04/21/2020  . Maxillary sinusitis 02/19/2015  . OSA (obstructive sleep apnea) 12/13/2013  . Hypothyroidism 12/04/2013  . Varicose veins of lower extremities with other complications 62/69/4854  . Insomnia 06/13/2013  . Pulmonary embolism ([place]) 05/29/2013  . Shortness of breath 04/20/2013  . Insulin resistance 11/29/2012  . HTN (hypertension) 11/29/2012  . Vitamin B12 deficiency 11/29/2012  . Chronic pain 07/11/2010  . Chronic venous insufficiency 06/21/2010  . B12 DEFICIENCY 05/10/2010  . IRRITABLE BOWEL SYNDROME 05/10/2010  . MALABSORPTION SYNDROME 05/10/2010  . ESSENTIAL HYPERTENSION 04/12/2010    Past Surgical History:  Procedure Laterality Date  . BILATERAL SHOULDER SURGERY    . BREAST EXCISIONAL BIOPSY Left 11/12/2015  . BREAST LUMPECTOMY WITH RADIOACTIVE SEED LOCALIZATION Left 11/12/2015   Procedure: LEFT BREAST LUMPECTOMY WITH RADIOACTIVE SEED LOCALIZATION;  Surgeon: Autumn Messing III, MD;  Location: [facility];  Service: General;  Laterality: Left;  . CHOLECYSTECTOMY  1987  . ENDOMETRIAL ABLATION     menorrhagia  . EXTRACORPOREAL SHOCK WAVE LITHOTRIPSY    . FOOT SURGERY  2000   Right tarsel tunnel release  . GASTRIC RESTRICTION SURGERY  remote past  . HERNIA REPAIR   1980's   Diaphragmatic + gastric stapling  . HERNIA REPAIR     Inguinal  . JOINT REPLACEMENT  2009   Right Knee, [facility]  . NEPHROLITHOTOMY Right 05/31/2013    Procedure: NEPHROLITHOTOMY PERCUTANEOUS;  Surgeon: Irine Seal, MD;  Location: [facility];  Service: Urology;  Laterality: Right;  . PFT's  09/2013; 12/2013   09/2013 mild restriction.  12/2013 Low vital capacity, possibly due to restriction from pt's body habitus.  . TRANSTHORACIC ECHOCARDIOGRAM  12/16/13   EF 62-70%, grade 2 diastolic dysfxn, PA pressure 37 (mildly high)     OB History   No obstetric history on file.     Family History  Problem Relation Age of Onset  . Hypertension Mother   . Cancer Mother        Brain/Lung/ smoker  . Diabetes Mother   . Stroke Father   .  Hypertension Father   . ADD / ADHD Daughter        ADHD  . Thyroid disease Brother   . Hypertension Brother   . Varicose Veins Brother   . Peripheral vascular disease Brother   . Heart attack Maternal Grandfather   . Alcohol abuse Paternal Grandfather   . Breast cancer Maternal Grandmother   . Breast cancer Paternal Aunt   . Breast cancer Paternal Aunt     Social History   Tobacco Use  . Smoking status: Former Smoker    Packs/day: 0.00    Years: 0.00    Pack years: 0.00    Quit date: 03/31/1998    Years since quitting: 22.0  . Smokeless tobacco: Never Used  Substance Use Topics  . Alcohol use: Yes    Comment: social  . Drug use: No    Home Medications Prior to Admission medications   Medication Sig Start Date End Date Taking? Authorizing Provider  predniSONE (DELTASONE) 10 MG tablet Take 4 tablets (40 mg total) by mouth daily for 4 days. 04/21/20 04/25/20 Yes Gareth Morgan, MD  acetaminophen (TYLENOL) 500 MG tablet Take 1,000 mg by mouth every 6 (six) hours as needed for mild pain or headache.     [provider]  AMLODIPINE BESYLATE PO Take 10 mg by mouth.    [provider]  amlodipine-atorvastatin (CADUET) 10-10 MG tablet Take 1 tablet by mouth daily.    [provider]  aspirin EC 81 MG tablet Take 81 mg by mouth every 6 (six) hours as needed for mild pain.  12/02/13    McGowen, Adrian Blackwater, MD  azelastine (ASTELIN) 0.1 % nasal spray Place 1-2 sprays into both nostrils 2 (two) times daily as needed for allergies. Reported on 08/09/2015 11/28/14   [provider]  cyclobenzaprine (FLEXERIL) 10 MG tablet Take 1 tablet (10 mg total) by mouth 3 (three) times daily as needed for muscle spasms. 03/16/19   Veryl Speak, MD  dicyclomine (BENTYL) 20 MG tablet Take 1 tablet (20 mg total) by mouth 3 (three) times daily before meals. 07/27/16   Orpah Greek, MD  docusate sodium (COLACE) 100 MG capsule Take 1 capsule (100 mg total) by mouth every 12 (twelve) hours. 07/27/16   Orpah Greek, MD  fluticasone (FLONASE) 50 MCG/ACT nasal spray Place 2 sprays into both nostrils daily. Reported on 08/09/2015 11/28/14   [provider]  furosemide (LASIX) 40 MG tablet Take 1 tablet (40 mg total) by mouth daily. 03/25/16 03/30/16  Leo Grosser, MD  irbesartan (AVAPRO) 300 MG tablet Take 300 mg by mouth daily. 09/28/15   [provider]  irbesartan (AVAPRO) 300 MG tablet Take 300 mg by mouth daily.    [provider]  lactulose (CHRONULAC) 10 GM/15ML solution Take 15 mLs (10 g total) by mouth 2 (two) times daily as needed for mild constipation or moderate constipation. 07/27/16   Orpah Greek, MD  levothyroxine (SYNTHROID, LEVOTHROID) 200 MCG tablet TAKE ONE TABLET BY MOUTH ONE TIME DAILY before breakfast 11/07/14   McGowen, Adrian Blackwater, MD  losartan (COZAAR) 50 MG tablet Take 50 mg by mouth daily.    [provider]  metFORMIN (GLUCOPHAGE) 500 MG tablet Take 1 tablet (500 mg total) by mouth 2 (two) times daily with a meal. Patient taking differently: Take 500 mg by mouth daily with breakfast.  07/21/14   McGowen, Adrian Blackwater, MD  methocarbamol (ROBAXIN) 500 MG tablet Take 1 tablet (500 mg total) by  mouth 2 (two) times daily. 08/19/17   Ward, Ozella Almond, PA-C  montelukast (SINGULAIR) 10 MG tablet Take 10 mg by mouth at bedtime.     [provider]  ondansetron (ZOFRAN) 4 MG tablet Take 4 mg by mouth every 8 (eight) hours as needed for nausea or vomiting.    [provider]  oxyCODONE (ROXICODONE) 15 MG immediate release tablet Take 15 mg by mouth 4 (four) times daily. FOR BACK PAIN AND ARTHRITIS AND RT KNEE PAIN    [provider]  pravastatin (PRAVACHOL) 20 MG tablet Take 20 mg by mouth daily.    [provider]  promethazine (PHENERGAN) 25 MG tablet Take 1 tablet (25 mg total) by mouth every 6 (six) hours as needed for nausea or vomiting. 07/27/16   Pollina, Gwenyth Allegra, MD    Allergies    Gabapentin, Mold extract [trichophyton], Morphine, and Codeine  Review of Systems   Review of Systems  Constitutional: Negative for fever.  HENT: Negative for sore throat.   Eyes: Negative for visual disturbance.  Respiratory: Positive for cough, shortness of breath and wheezing.   Cardiovascular: Negative for chest pain.  Gastrointestinal: Negative for abdominal pain, diarrhea, nausea (not now but will have it coming and going) and vomiting.  Genitourinary: Negative for difficulty urinating and dysuria (on abx now for UTI).  Musculoskeletal: Negative for back pain and neck pain.  Skin: Negative for rash.  Neurological: Negative for syncope and headaches.    Physical Exam Updated Vital Signs BP (!) 154/79 (BP Location: Right Arm)   Pulse (!) 104   Temp 97.7 F (36.5 C) (Axillary)   Resp (!) 22   Ht 5\' 4"  (1.626 m)   Wt (!) 154.2 kg   SpO2 97%   BMI 58.36 kg/m   Physical Exam Vitals and nursing note reviewed.  Constitutional:      General: She is not in acute distress.    Appearance: She is well-developed and well-nourished. She is not diaphoretic.  HENT:     Head: Normocephalic and atraumatic.  Eyes:     Extraocular Movements: EOM normal.     Conjunctiva/sclera: Conjunctivae normal.  Cardiovascular:     Rate and Rhythm: Normal rate and regular rhythm.     Pulses: Intact  distal pulses.     Heart sounds: Normal heart sounds. No murmur heard. No friction rub. No gallop.   Pulmonary:     Effort: Pulmonary effort is normal. No respiratory distress.     Breath sounds: Normal breath sounds. No wheezing or rales.  Abdominal:     General: There is no distension.     Palpations: Abdomen is soft.     Tenderness: There is no abdominal tenderness. There is no guarding.  Musculoskeletal:        General: No tenderness or edema.     Cervical back: Normal range of motion.  Skin:    General: Skin is warm and dry.     Findings: No erythema or rash.  Neurological:     Mental Status: She is alert and oriented to person, place, and time.     ED Results / Procedures / Treatments   Labs (all labs ordered are listed, but only abnormal results are displayed) Labs Reviewed  COMPREHENSIVE METABOLIC PANEL - Abnormal; Notable for the following components:      Result Value   Chloride 95 (*)    Glucose, Bld 124 (*)    All other components within normal limits  D-DIMER, QUANTITATIVE (  NOT AT [facility]) - Abnormal; Notable for the following components:   D-Dimer, Quant 0.61 (*)    All other components within normal limits  SARS CORONAVIRUS 2 BY RT PCR (HOSPITAL ORDER, [place] [facility])  CBC WITH DIFFERENTIAL/PLATELET  BRAIN NATRIURETIC PEPTIDE  LACTIC ACID, PLASMA  TROPONIN I (HIGH SENSITIVITY)    EKG EKG Interpretation  Date/Time:  Saturday April 21 2020 11:34:34 EST Ventricular Rate:  94 PR Interval:    QRS Duration: 86 QT Interval:  321 QTC Calculation: 402 R Axis:   -15 Text Interpretation: Sinus rhythm Atrial premature complex Borderline left axis deviation Low voltage, precordial leads Abnormal R-wave progression, early transition No significant change since last tracing Confirmed by Gareth Morgan [phone]) on 04/21/2020 1:52:31 PM   Radiology DG Chest Portable 1 View  Result Date: 04/21/2020 CLINICAL DATA:  Shortness of breath EXAM:  PORTABLE CHEST 1 VIEW COMPARISON:  [facility] CTA chest dated 04/19/2020 FINDINGS: Lungs are essentially clear.  No pleural effusion or pneumothorax. The heart is top-normal in size. Prominent right epicardial fat, better evaluated on prior CT. Visualized osseous structures are within normal limits. Old right posterior 4th rib fracture deformity is better visualized on CT. IMPRESSION: No evidence of acute cardiopulmonary disease. Electronically Signed   By: Julian Hy M.D.   On: 04/21/2020 13:03    Procedures Procedures (including critical care time)  Medications Ordered in ED Medications  methylPREDNISolone sodium succinate (SOLU-MEDROL) 125 mg/2 mL injection 125 mg (125 mg Intravenous Given 04/21/20 1355)  ipratropium-albuterol (DUONEB) 0.5-2.5 (3) MG/3ML nebulizer solution 3 mL (3 mLs Nebulization Given 04/21/20 1329)    ED Course  I have reviewed the triage vital signs and the nursing notes.  Pertinent labs & imaging results that were available during my care of the patient were reviewed by me and considered in my medical decision making (see chart for details).    MDM Rules/Calculators/A&P                          65 year old female with a history of hypertension, diabetes, morbid obesity, recent evaluation at [facility] due to hypoxia who left AMA presents with concern for shortness of breath.  Reviewed prior records in detail, including her recent near admission at [facility] where she left [place].  Today, chest x-ray shows no evidence of pulmonary edema, pneumonia, or pneumothorax.  EKG does not show acute changes.  Labs showed no evidence of anemia or significant electrolyte abnormalities.  D-dimer age-adjusted is negative, and given negative PE study 2 days ago despite limitations I have low suspicion for pulmonary embolus as etiology of her symptoms.  Repeated COVID test which was also done at [facility] which shows no evidence of COVID-19.  She does not have  significant wheezing or exam concerning for asthma exacerbation, however given increased cough for the last couple of days and patient reported wheezing at home ordered steroids and duonebs.  I do not suspect this is the etiology of her hypoxia.  We have a higher suspicion for worsening chronic hypoxia due to OHS, OSA, likely pulmonary hypertension.   Discussed in detail with patient my recommendation for her to come into the hospital given her severe hypoxia and symptoms, and that she will likely need to be discharged with an oxygen prescription after hospitalization.  Discussed with pulmonology, and hospitalist.  She then stated that she could not stay in the room, needed "fresh air", and despite knowing the  risks of hypoxemia including disability and death, would like to leave the emergency department [place].  She has a capacity to make this decision, and understands the risks.  Place an order for transitions of care to see if were able to start patient on oxygen at home.  Final Clinical Impression(s) / ED Diagnoses Final diagnoses:  Hypoxia  Acute on chronic respiratory failure with hypoxia (HCC)  Obstructive sleep apnea syndrome    Rx / DC Orders ED Discharge Orders         Ordered    predniSONE (DELTASONE) 10 MG tablet  Daily        04/21/20 1610           Gareth Morgan, MD 04/21/20 1646

## 2020-04-21 NOTE — ED Notes (Signed)
Initial SAT 78% on RA. Patient SOB at baseline, but much more with exertion. Placed on 4LNC, SAT now 97%. RT to monitor.

## 2020-04-24 ENCOUNTER — Encounter: Payer: Self-pay | Admitting: Pulmonary Disease

## 2020-04-24 ENCOUNTER — Other Ambulatory Visit: Payer: Self-pay

## 2020-04-24 ENCOUNTER — Ambulatory Visit: Payer: Medicare Other | Admitting: Pulmonary Disease

## 2020-04-24 VITALS — BP 140/88 | HR 120 | Temp 97.4°F | Ht 65.5 in | Wt 380.2 lb

## 2020-04-24 DIAGNOSIS — J9601 Acute respiratory failure with hypoxia: Secondary | ICD-10-CM | POA: Insufficient documentation

## 2020-04-24 NOTE — Progress Notes (Signed)
Subjective:   PATIENT ID: Tricia Perry GENDER: female DOB: March 25, 1956, MRN: [id number]   HPI  Chief Complaint  Patient presents with  . Consult    Has had several ER visits but no beds in the hospital for her to stay.  She stated that once she took her booster shot her oxygen levels dropped to the 70's-80's and she has not been able to get her oxygen levels up.      Reason for Visit: New consult for hypoxemia  65 year old never smoker with OSA who presents for hypoxemia.  She reports the she began having gradually worsening shortness of breath after receiving the covid booster shot in 03/27/20. Dyspnea associated with non-productive cough and rare wheezing. Denies fevers or chills. Denies sick contacts. She monitored her oxygen levels and noticed it dropping last week to as low as the 70s. She initially presented to to [facility] ED on 1/20 and after testing negative for covid left AMA. Her dyspnea worsened so she presented to [facility], CTA was negative for PE and once again left AMA due to concern for getting covid.  ED notes from 04/19/20 and 04/21/20 were reviewed. She was found with hypoxemia requiring 3-4L O2 at both [place] and advised for admission however she stated she would want to go home at both occasions. No home oxygen was arranged due to leaving against medical advice.   Social History: Remote smoking >45 years ago for 2 years at 65 years old.  I have personally reviewed patient's past medical/family/social history, allergies, current medications.  Past Medical History:  Diagnosis Date  . Arthritis    Knees; right-TKR, left-bone on bone/end stage  . Asthma   . Carpal tunnel syndrome on both sides 06/21/2010  . Carpal tunnel syndrome, bilateral   . Chronic nonallergic rhinitis    allergy eval NEG 10/2014: azelastine and flonase spray rx'd  . Chronic venous insufficiency    with edema  and extensive varicose dz: Dr. Linus Mako  is managing this, planning laser procedures.; A LOT OF LEG CRAMPS AND LEG STIFFNESS  . Complication of anesthesia    STATES SHE WAS TOLD SHE WOKE UP DURING HER KNEE REPLACEMENT SURGERY AND HAD TO BE GIVEN MORE MEDICINE - NOT SURE IF SPINAL OR GENERAL ANESTHESIA  . DDD (degenerative disc disease)    Dr. [name] evaluated her and recommended pain mgmt MD.  She saw Dr. Selinda Orion for pain mgmt at one time.  . Diabetes mellitus without complication (HCC)    Type II  . Disequilibrium syndrome    MRI brain normal 2012  . DIZZINESS 04/12/2010   Qualifier: Diagnosis of  By: Anitra Lauth M.D., Brien Few   . Fibromyalgia    questionable  . GERD (gastroesophageal reflux disease)   . Heart murmur    functional, w/u done; PALPITATIONS IN THE PAST  . History of bronchitis   . History of palpitations   . History of pulmonary embolism 05/2013   Right sided.  Setting: post-op percutaneous nephrolithotomy--xarelto x 6 mo  . Hypertension   . Hypothyroidism   . Insomnia   . Insulin resistance   . Kidney stone on right side 12/2014   Dr. Jeffie Pollock considering PCNL, ESWL, and ureteroscopy as of 01/25/15   . Microscopic hematuria 2014   CT abd pelv showed 1.8 cm right renal pelvis stone.  Also had UA c/w UTI so abx given.   . Mixed incontinence urge and stress  and nocturia x 5-6 per night  . Morbid obesity (HCC)    BMI 49.  Gastric stapling surgery in distant past.  . Nephrolithiasis    Lithotripsy 2002.  Right percut nephrolith 05/2013.  Marland Kitchen Nocturia   . Numbness and tingling    bilateral hands and feet  . OAB (overactive bladder)    Per Dr. Jeffie Pollock: pt declined pelvic floor PT.  Vesicare trial started 01/25/15.  . Obesity hypoventilation syndrome ([place])   . Peripheral neuropathy    No old records to confirm pt's report.  Pt refuses to have more NCS b/c test is painful.  Marland Kitchen PONV (postoperative nausea and vomiting)   . Shortness of breath    CHRONIC; 3 mo trial off of ACE-I made no difference.  Spirometry x 2 has  shown no obstruction.  Allergy w/u NEG.  . Shoulder pain    HX OF BILATERAL SHOULDER SURGERIES - PT HAS VERY LIMITED ROM - ESPECIALLY RAISING HER ARMS  . Sleep apnea    PT DOES NOT HAVE MASK OR TUBING - JUST SLEEPS WITH HEAD ELEVATED ON 2 PILLOWS   . Spinal stenosis    with L5 radiculopathy, also pseudospondylolisthesis per Dr. [name]  . Superficial thrombophlebitis of left leg 05/2013   with cellulitis--resolved approp with abx and ice  . Urinary incontinence   . Urinary urgency   . Vertigo      Family History  Problem Relation Age of Onset  . Hypertension Mother   . Cancer Mother        Brain/Lung/ smoker  . Diabetes Mother   . Stroke Father   . Hypertension Father   . ADD / ADHD Daughter        ADHD  . Thyroid disease Brother   . Hypertension Brother   . Varicose Veins Brother   . Peripheral vascular disease Brother   . Heart attack Maternal Grandfather   . Alcohol abuse Paternal Grandfather   . Breast cancer Maternal Grandmother   . Breast cancer Paternal Aunt   . Breast cancer Paternal Aunt      Social History   Occupational History  . Occupation: HOUSEWIFE    Employer: UNEMPLOYED  Tobacco Use  . Smoking status: Former Smoker    Packs/day: 0.00    Years: 0.00    Pack years: 0.00    Quit date: 03/31/1998    Years since quitting: 22.0  . Smokeless tobacco: Never Used  Substance and Sexual Activity  . Alcohol use: Yes    Comment: social  . Drug use: No  . Sexual activity: Not on file    Allergies  Allergen Reactions  . Gabapentin Swelling    Legs   . Mold Extract [Trichophyton] Nausea And Vomiting and Other (See Comments)    Sneezing real bad   . Morphine Itching and Other (See Comments)    back hurts  . Codeine Itching and Other (See Comments)    Back hurts     Outpatient Medications Prior to Visit  Medication Sig Dispense Refill  . AMLODIPINE BESYLATE PO Take 10 mg by mouth.    . Cholecalciferol 1.25 MG (50000 UT) capsule cholecalciferol (vitamin  D3) 1,250 mcg (50,000 unit) capsule  TAKE 1 CAPSULE BY MOUTH WEEKLY    . levothyroxine (SYNTHROID, LEVOTHROID) 200 MCG tablet TAKE ONE TABLET BY MOUTH ONE TIME DAILY before breakfast 30 tablet 11  . losartan (COZAAR) 50 MG tablet Take 50 mg by mouth daily.    . metFORMIN (GLUCOPHAGE) 500 MG  tablet Take 1 tablet (500 mg total) by mouth 2 (two) times daily with a meal. (Patient taking differently: Take 500 mg by mouth daily with breakfast.) 60 tablet 11  . montelukast (SINGULAIR) 10 MG tablet Take 10 mg by mouth at bedtime.    . predniSONE (DELTASONE) 10 MG tablet Take 4 tablets (40 mg total) by mouth daily for 4 days. 15 tablet 0  . acetaminophen (TYLENOL) 500 MG tablet Take 1,000 mg by mouth every 6 (six) hours as needed for mild pain or headache.  (Patient not taking: Reported on 04/24/2020)    . amlodipine-atorvastatin (CADUET) 10-10 MG tablet Take 1 tablet by mouth daily. (Patient not taking: Reported on 04/24/2020)    . aspirin EC 81 MG tablet Take 81 mg by mouth every 6 (six) hours as needed for mild pain.  (Patient not taking: Reported on 04/24/2020)    . azelastine (ASTELIN) 0.1 % nasal spray Place 1-2 sprays into both nostrils 2 (two) times daily as needed for allergies. Reported on 08/09/2015 (Patient not taking: Reported on 04/24/2020)    . cyclobenzaprine (FLEXERIL) 10 MG tablet Take 1 tablet (10 mg total) by mouth 3 (three) times daily as needed for muscle spasms. (Patient not taking: Reported on 04/24/2020) 15 tablet 0  . dicyclomine (BENTYL) 20 MG tablet Take 1 tablet (20 mg total) by mouth 3 (three) times daily before meals. (Patient not taking: Reported on 04/24/2020) 90 tablet 0  . docusate sodium (COLACE) 100 MG capsule Take 1 capsule (100 mg total) by mouth every 12 (twelve) hours. (Patient not taking: Reported on 04/24/2020) 60 capsule 0  . fluticasone (FLONASE) 50 MCG/ACT nasal spray Place 2 sprays into both nostrils daily. Reported on 08/09/2015 (Patient not taking: Reported on 04/24/2020)     . furosemide (LASIX) 40 MG tablet Take 1 tablet (40 mg total) by mouth daily. 5 tablet 0  . irbesartan (AVAPRO) 300 MG tablet Take 300 mg by mouth daily. (Patient not taking: Reported on 04/24/2020)  2  . irbesartan (AVAPRO) 300 MG tablet Take 300 mg by mouth daily. (Patient not taking: Reported on 04/24/2020)    . lactulose (CHRONULAC) 10 GM/15ML solution Take 15 mLs (10 g total) by mouth 2 (two) times daily as needed for mild constipation or moderate constipation. (Patient not taking: Reported on 04/24/2020) 240 mL 0  . methocarbamol (ROBAXIN) 500 MG tablet Take 1 tablet (500 mg total) by mouth 2 (two) times daily. (Patient not taking: Reported on 04/24/2020) 20 tablet 0  . nitrofurantoin, macrocrystal-monohydrate, (MACROBID) 100 MG capsule Take by mouth. (Patient not taking: Reported on 04/24/2020)    . ondansetron (ZOFRAN) 4 MG tablet Take 4 mg by mouth every 8 (eight) hours as needed for nausea or vomiting. (Patient not taking: Reported on 04/24/2020)    . oxyCODONE (ROXICODONE) 15 MG immediate release tablet Take 15 mg by mouth 4 (four) times daily. FOR BACK PAIN AND ARTHRITIS AND RT KNEE PAIN (Patient not taking: Reported on 04/24/2020)    . pravastatin (PRAVACHOL) 20 MG tablet Take 20 mg by mouth daily. (Patient not taking: Reported on 04/24/2020)    . promethazine (PHENERGAN) 25 MG tablet Take 1 tablet (25 mg total) by mouth every 6 (six) hours as needed for nausea or vomiting. (Patient not taking: Reported on 04/24/2020) 30 tablet 0   No facility-administered medications prior to visit.    Review of Systems  Constitutional: Negative for chills, diaphoresis, fever, malaise/fatigue and weight loss.  HENT: Negative for congestion, ear pain and  sore throat.   Respiratory: Positive for cough, shortness of breath and wheezing. Negative for hemoptysis and sputum production.   Cardiovascular: Negative for chest pain, palpitations and leg swelling.  Gastrointestinal: Negative for abdominal pain,  heartburn and nausea.  Genitourinary: Negative for frequency.  Musculoskeletal: Negative for joint pain and myalgias.  Skin: Negative for itching and rash.  Neurological: Negative for dizziness, weakness and headaches.  Endo/Heme/Allergies: Does not bruise/bleed easily.  Psychiatric/Behavioral: Negative for depression. The patient is not nervous/anxious.      Objective:   Vitals:   04/24/20 1602 04/24/20 1604  BP: 140/88   Pulse: (!) 120   Temp: (!) 97.4 F (36.3 C)   TempSrc: Tympanic   SpO2: (!) 83% 92%  Weight: (!) 380 lb 4 oz (172.5 kg)   Height: 5' 5.5" (1.664 m)    SpO2: 92 % O2 Device: Nasal cannula O2 Flow Rate (L/min): 2 L/min O2 Type: Continuous O2  Physical Exam: General: Morbidly obese, chronically ill-appearing, no acute distress HENT: [place], AT Eyes: EOMI, no scleral icterus Respiratory: Diminished breath soundsbilaterally Cardiovascular: RRR, -M/R/G, no JVD GI: BS+, soft, nontender Extremities: Lymphedema with lower extremity edema Neuro: AAO x4, CNII-XII grossly intact Psych: Normal mood, normal affect  Data Reviewed:  Imaging: CTA 03/16/19 Low lung volumes. Large right epicardial fat CXR 04/21/20 Prominent right epicardial fat. No infiltrate.  PFT: FVC 2.08 (63%) FEV1 1.56 (61%) Ratio 75   Interpretation: Reduced FEV1 and FVC which can suggest restrictive defect. No obstructive defect on spirometry.  Labs: CBC    Component Value Date/Time   WBC 6.9 04/21/2020 1204   RBC 4.61 04/21/2020 1204   HGB 13.8 04/21/2020 1204   HCT 45.9 04/21/2020 1204   PLT 299 04/21/2020 1204   MCV 99.6 04/21/2020 1204   MCH 29.9 04/21/2020 1204   MCHC 30.1 04/21/2020 1204   RDW 14.8 04/21/2020 1204   LYMPHSABS 1.3 04/21/2020 1204   MONOABS 0.6 04/21/2020 1204   EOSABS 0.2 04/21/2020 1204   BASOSABS 0.0 04/21/2020 1204   BMET    Component Value Date/Time   NA 137 04/21/2020 1204   K 4.4 04/21/2020 1204   CL 95 (L) 04/21/2020 1204   CO2 31 04/21/2020 1204    GLUCOSE 124 (H) 04/21/2020 1204   BUN 9 04/21/2020 1204   CREATININE 0.50 04/21/2020 1204   CREATININE 0.70 12/19/2013 1343   CALCIUM 9.5 04/21/2020 1204   GFRNONAA >60 04/21/2020 1204   GFRNONAA >60 06/21/2010 0000   GFRAA >60 03/16/2019 2104   GFRAA >60 06/21/2010 0000   Imaging, labs and test noted above have been reviewed independently by me.    Assessment & Plan:   Discussion: Acute hypoxemic respiratory failure  66 year old female with OSA who presented to Pulmonary clinic as a new self-referred consult. She previously saw Dr. Melvyn Novas in 2015 for 3 visits. She was found to be hypoxemic to 83% on RA at rest. Patient has had hypoxemia for the last week and presented to the ED x 2 with each visit recommending admission and patient leaving AMA without oxygen. On review of prior office records, O2 saturations noted to be 95-99%. This oxygen requirement is new and needs further work-up in the hospital. I offered to call ambulance for safe transportation with oxygen however she declined and preferred to drive herself. She expressed understanding of risks of leaving without oxygen. Two CMAs present for conversation.    Health Maintenance Immunization History  Administered Date(s) Administered  . Influenza Split 03/31/2008,  12/30/2010  . Influenza Whole 05/10/2010  . Influenza,inj,Quad PF,6+ Mos 05/18/2014  . Influenza,inj,Quad PF,6-35 Mos 01/14/2016, 12/03/2016  . Pneumococcal Polysaccharide-23 10/28/2012  . Td 03/31/2001  . Tdap 04/01/2003, 10/28/2012   CT Lung Screen - not indicated  No orders of the defined types were placed in this encounter. No orders of the defined types were placed in this encounter.   No follow-ups on file.  I have spent a total time of 65-minutes on the day of the appointment reviewing prior documentation, coordinating care and discussing medical diagnosis and plan with the patient/family. Imaging, labs and tests included in this note have been reviewed  and interpreted independently by me.  [place], MD [facility] 04/24/2020 5:34 PM  Office Number [phone]

## 2020-04-30 ENCOUNTER — Observation Stay (HOSPITAL_COMMUNITY): Payer: Medicare Other

## 2020-04-30 ENCOUNTER — Other Ambulatory Visit: Payer: Self-pay

## 2020-04-30 ENCOUNTER — Emergency Department (HOSPITAL_COMMUNITY): Payer: Medicare Other

## 2020-04-30 ENCOUNTER — Encounter (HOSPITAL_COMMUNITY): Payer: Self-pay | Admitting: Internal Medicine

## 2020-04-30 ENCOUNTER — Inpatient Hospital Stay (HOSPITAL_COMMUNITY)
Admission: EM | Admit: 2020-04-30 | Discharge: 2020-05-03 | DRG: 189 | Disposition: A | Discharge status: Home Health | Payer: Medicare Other | Attending: Internal Medicine | Admitting: Internal Medicine

## 2020-04-30 DIAGNOSIS — Z87442 Personal history of urinary calculi: Secondary | ICD-10-CM

## 2020-04-30 DIAGNOSIS — Z87891 Personal history of nicotine dependence: Secondary | ICD-10-CM | POA: Diagnosis not present

## 2020-04-30 DIAGNOSIS — J9621 Acute and chronic respiratory failure with hypoxia: Secondary | ICD-10-CM | POA: Diagnosis present

## 2020-04-30 DIAGNOSIS — Z86711 Personal history of pulmonary embolism: Secondary | ICD-10-CM

## 2020-04-30 DIAGNOSIS — M1990 Unspecified osteoarthritis, unspecified site: Secondary | ICD-10-CM | POA: Diagnosis present

## 2020-04-30 DIAGNOSIS — I872 Venous insufficiency (chronic) (peripheral): Secondary | ICD-10-CM | POA: Diagnosis present

## 2020-04-30 DIAGNOSIS — Z20822 Contact with and (suspected) exposure to covid-19: Secondary | ICD-10-CM | POA: Diagnosis present

## 2020-04-30 DIAGNOSIS — Z833 Family history of diabetes mellitus: Secondary | ICD-10-CM | POA: Diagnosis not present

## 2020-04-30 DIAGNOSIS — N3281 Overactive bladder: Secondary | ICD-10-CM | POA: Diagnosis present

## 2020-04-30 DIAGNOSIS — G4733 Obstructive sleep apnea (adult) (pediatric): Secondary | ICD-10-CM | POA: Diagnosis present

## 2020-04-30 DIAGNOSIS — J9622 Acute and chronic respiratory failure with hypercapnia: Principal | ICD-10-CM | POA: Diagnosis present

## 2020-04-30 DIAGNOSIS — I10 Essential (primary) hypertension: Secondary | ICD-10-CM | POA: Diagnosis not present

## 2020-04-30 DIAGNOSIS — N3946 Mixed incontinence: Secondary | ICD-10-CM | POA: Diagnosis present

## 2020-04-30 DIAGNOSIS — Z96651 Presence of right artificial knee joint: Secondary | ICD-10-CM | POA: Diagnosis present

## 2020-04-30 DIAGNOSIS — I5033 Acute on chronic diastolic (congestive) heart failure: Secondary | ICD-10-CM | POA: Diagnosis present

## 2020-04-30 DIAGNOSIS — R011 Cardiac murmur, unspecified: Secondary | ICD-10-CM | POA: Diagnosis present

## 2020-04-30 DIAGNOSIS — Z6841 Body Mass Index (BMI) 40.0 and over, adult: Secondary | ICD-10-CM

## 2020-04-30 DIAGNOSIS — E662 Morbid (severe) obesity with alveolar hypoventilation: Secondary | ICD-10-CM | POA: Diagnosis present

## 2020-04-30 DIAGNOSIS — I110 Hypertensive heart disease with heart failure: Secondary | ICD-10-CM | POA: Diagnosis present

## 2020-04-30 DIAGNOSIS — M797 Fibromyalgia: Secondary | ICD-10-CM | POA: Diagnosis present

## 2020-04-30 DIAGNOSIS — Z7984 Long term (current) use of oral hypoglycemic drugs: Secondary | ICD-10-CM

## 2020-04-30 DIAGNOSIS — E039 Hypothyroidism, unspecified: Secondary | ICD-10-CM | POA: Diagnosis present

## 2020-04-30 DIAGNOSIS — J9620 Acute and chronic respiratory failure, unspecified whether with hypoxia or hypercapnia: Secondary | ICD-10-CM | POA: Diagnosis present

## 2020-04-30 DIAGNOSIS — I517 Cardiomegaly: Secondary | ICD-10-CM | POA: Diagnosis not present

## 2020-04-30 DIAGNOSIS — K219 Gastro-esophageal reflux disease without esophagitis: Secondary | ICD-10-CM | POA: Diagnosis present

## 2020-04-30 DIAGNOSIS — Z885 Allergy status to narcotic agent status: Secondary | ICD-10-CM

## 2020-04-30 DIAGNOSIS — Z9119 Patient's noncompliance with other medical treatment and regimen: Secondary | ICD-10-CM | POA: Diagnosis not present

## 2020-04-30 DIAGNOSIS — R0602 Shortness of breath: Secondary | ICD-10-CM | POA: Diagnosis present

## 2020-04-30 DIAGNOSIS — Z79899 Other long term (current) drug therapy: Secondary | ICD-10-CM

## 2020-04-30 DIAGNOSIS — J189 Pneumonia, unspecified organism: Secondary | ICD-10-CM

## 2020-04-30 DIAGNOSIS — Z9049 Acquired absence of other specified parts of digestive tract: Secondary | ICD-10-CM

## 2020-04-30 DIAGNOSIS — E669 Obesity, unspecified: Secondary | ICD-10-CM | POA: Diagnosis not present

## 2020-04-30 DIAGNOSIS — K5289 Other specified noninfective gastroenteritis and colitis: Secondary | ICD-10-CM | POA: Diagnosis present

## 2020-04-30 DIAGNOSIS — Z8249 Family history of ischemic heart disease and other diseases of the circulatory system: Secondary | ICD-10-CM | POA: Diagnosis not present

## 2020-04-30 DIAGNOSIS — Z9109 Other allergy status, other than to drugs and biological substances: Secondary | ICD-10-CM

## 2020-04-30 DIAGNOSIS — G4730 Sleep apnea, unspecified: Secondary | ICD-10-CM | POA: Diagnosis not present

## 2020-04-30 DIAGNOSIS — E119 Type 2 diabetes mellitus without complications: Secondary | ICD-10-CM | POA: Diagnosis not present

## 2020-04-30 DIAGNOSIS — E1142 Type 2 diabetes mellitus with diabetic polyneuropathy: Secondary | ICD-10-CM | POA: Diagnosis present

## 2020-04-30 DIAGNOSIS — J310 Chronic rhinitis: Secondary | ICD-10-CM | POA: Diagnosis present

## 2020-04-30 DIAGNOSIS — Z79890 Hormone replacement therapy (postmenopausal): Secondary | ICD-10-CM

## 2020-04-30 DIAGNOSIS — J45909 Unspecified asthma, uncomplicated: Secondary | ICD-10-CM | POA: Diagnosis present

## 2020-04-30 DIAGNOSIS — G4700 Insomnia, unspecified: Secondary | ICD-10-CM | POA: Diagnosis present

## 2020-04-30 DIAGNOSIS — I890 Lymphedema, not elsewhere classified: Secondary | ICD-10-CM | POA: Diagnosis present

## 2020-04-30 DIAGNOSIS — Z8349 Family history of other endocrine, nutritional and metabolic diseases: Secondary | ICD-10-CM

## 2020-04-30 LAB — BLOOD GAS, VENOUS
Acid-Base Excess: 10.8 mmol/L — ABNORMAL HIGH (ref 0.0–2.0)
Bicarbonate: 37.8 mmol/L — ABNORMAL HIGH (ref 20.0–28.0)
Drawn by: 1470
FIO2: 21
O2 Saturation: 81 %
Patient temperature: 37
pCO2, Ven: 85.4 mmHg (ref 44.0–60.0)
pH, Ven: 7.269 (ref 7.250–7.430)
pO2, Ven: 51.5 mmHg — ABNORMAL HIGH (ref 32.0–45.0)

## 2020-04-30 LAB — CBC WITH DIFFERENTIAL/PLATELET
Abs Immature Granulocytes: 0.03 10*3/uL (ref 0.00–0.07)
Basophils Absolute: 0.1 10*3/uL (ref 0.0–0.1)
Basophils Relative: 1 %
Eosinophils Absolute: 0.3 10*3/uL (ref 0.0–0.5)
Eosinophils Relative: 4 %
HCT: 44.8 % (ref 36.0–46.0)
Hemoglobin: 13.6 g/dL (ref 12.0–15.0)
Immature Granulocytes: 0 %
Lymphocytes Relative: 19 %
Lymphs Abs: 1.3 10*3/uL (ref 0.7–4.0)
MCH: 30.8 pg (ref 26.0–34.0)
MCHC: 30.4 g/dL (ref 30.0–36.0)
MCV: 101.6 fL — ABNORMAL HIGH (ref 80.0–100.0)
Monocytes Absolute: 0.6 10*3/uL (ref 0.1–1.0)
Monocytes Relative: 9 %
Neutro Abs: 4.5 10*3/uL (ref 1.7–7.7)
Neutrophils Relative %: 67 %
Platelets: 283 10*3/uL (ref 150–400)
RBC: 4.41 MIL/uL (ref 3.87–5.11)
RDW: 15.1 % (ref 11.5–15.5)
WBC: 6.8 10*3/uL (ref 4.0–10.5)
nRBC: 0 % (ref 0.0–0.2)

## 2020-04-30 LAB — ECHOCARDIOGRAM COMPLETE
Area-P 1/2: 2.73 cm2
S' Lateral: 2.6 cm

## 2020-04-30 LAB — HIV ANTIBODY (ROUTINE TESTING W REFLEX): HIV Screen 4th Generation wRfx: NONREACTIVE

## 2020-04-30 LAB — I-STAT VENOUS BLOOD GAS, ED
Acid-Base Excess: 11 mmol/L — ABNORMAL HIGH (ref 0.0–2.0)
Bicarbonate: 37.8 mmol/L — ABNORMAL HIGH (ref 20.0–28.0)
Calcium, Ion: 1 mmol/L — ABNORMAL LOW (ref 1.15–1.40)
HCT: 47 % — ABNORMAL HIGH (ref 36.0–46.0)
Hemoglobin: 16 g/dL — ABNORMAL HIGH (ref 12.0–15.0)
O2 Saturation: 70 %
Potassium: 4.5 mmol/L (ref 3.5–5.1)
Sodium: 137 mmol/L (ref 135–145)
TCO2: 40 mmol/L — ABNORMAL HIGH (ref 22–32)
pCO2, Ven: 58.2 mmHg (ref 44.0–60.0)
pH, Ven: 7.421 (ref 7.250–7.430)
pO2, Ven: 37 mmHg (ref 32.0–45.0)

## 2020-04-30 LAB — COMPREHENSIVE METABOLIC PANEL
ALT: 24 U/L (ref 0–44)
AST: 20 U/L (ref 15–41)
Albumin: 3.5 g/dL (ref 3.5–5.0)
Alkaline Phosphatase: 83 U/L (ref 38–126)
Anion gap: 10 (ref 5–15)
BUN: 7 mg/dL — ABNORMAL LOW (ref 8–23)
CO2: 31 mmol/L (ref 22–32)
Calcium: 8.9 mg/dL (ref 8.9–10.3)
Chloride: 97 mmol/L — ABNORMAL LOW (ref 98–111)
Creatinine, Ser: 0.52 mg/dL (ref 0.44–1.00)
GFR, Estimated: >60 mL/min (ref >60)
Glucose, Bld: 127 mg/dL — ABNORMAL HIGH (ref 70–99)
Potassium: 4.2 mmol/L (ref 3.5–5.1)
Sodium: 138 mmol/L (ref 135–145)
Total Bilirubin: 0.5 mg/dL (ref 0.3–1.2)
Total Protein: 6.7 g/dL (ref 6.5–8.1)

## 2020-04-30 LAB — GLUCOSE, CAPILLARY
Glucose-Capillary: 120 mg/dL — ABNORMAL HIGH (ref 70–99)
Glucose-Capillary: 131 mg/dL — ABNORMAL HIGH (ref 70–99)

## 2020-04-30 LAB — SARS CORONAVIRUS 2 BY RT PCR (HOSPITAL ORDER, PERFORMED IN ~~LOC~~ HOSPITAL LAB): SARS Coronavirus 2: NEGATIVE

## 2020-04-30 LAB — STREP PNEUMONIAE URINARY ANTIGEN: Strep Pneumo Urinary Antigen: NEGATIVE

## 2020-04-30 LAB — BRAIN NATRIURETIC PEPTIDE: B Natriuretic Peptide: 24.1 pg/mL (ref 0.0–100.0)

## 2020-04-30 MED ORDER — LEVOTHYROXINE SODIUM 100 MCG PO TABS
200.0000 ug | ORAL_TABLET | Freq: Every day | ORAL | Status: DC
  Administered 2020-05-01 – 2020-05-03 (×3): 200 ug via ORAL
  Filled 2020-04-30 (×3): qty 2

## 2020-04-30 MED ORDER — PERFLUTREN LIPID MICROSPHERE
1.0000 mL | INTRAVENOUS | Status: AC | PRN
  Administered 2020-04-30: 2.5 mL via INTRAVENOUS
  Filled 2020-04-30: qty 10

## 2020-04-30 MED ORDER — INSULIN ASPART 100 UNIT/ML ~~LOC~~ SOLN
0.0000 [IU] | Freq: Three times a day (TID) | SUBCUTANEOUS | Status: DC
  Administered 2020-05-02: 3 [IU] via SUBCUTANEOUS

## 2020-04-30 MED ORDER — LOSARTAN POTASSIUM 50 MG PO TABS
100.0000 mg | ORAL_TABLET | Freq: Every day | ORAL | Status: DC
  Administered 2020-05-01 – 2020-05-03 (×3): 100 mg via ORAL
  Filled 2020-04-30 (×3): qty 2

## 2020-04-30 MED ORDER — MONTELUKAST SODIUM 10 MG PO TABS
10.0000 mg | ORAL_TABLET | Freq: Every morning | ORAL | Status: DC
  Administered 2020-05-01 – 2020-05-03 (×3): 10 mg via ORAL
  Filled 2020-04-30 (×3): qty 1

## 2020-04-30 MED ORDER — AMLODIPINE BESYLATE 5 MG PO TABS: 5.0000 mg | ORAL_TABLET | Freq: Every day | ORAL | Status: DC

## 2020-04-30 MED ORDER — AMLODIPINE BESYLATE 5 MG PO TABS
5.0000 mg | ORAL_TABLET | Freq: Every day | ORAL | Status: DC
  Administered 2020-05-01 – 2020-05-03 (×3): 5 mg via ORAL
  Filled 2020-04-30 (×3): qty 1

## 2020-04-30 MED ORDER — ENOXAPARIN SODIUM 40 MG/0.4ML ~~LOC~~ SOLN
40.0000 mg | SUBCUTANEOUS | Status: DC
  Administered 2020-05-01 – 2020-05-02 (×2): 40 mg via SUBCUTANEOUS
  Filled 2020-04-30 (×2): qty 0.4

## 2020-04-30 MED ORDER — ACETAMINOPHEN 325 MG PO TABS
650.0000 mg | ORAL_TABLET | Freq: Four times a day (QID) | ORAL | Status: DC | PRN
  Administered 2020-05-02: 650 mg via ORAL
  Filled 2020-04-30: qty 2

## 2020-04-30 MED ORDER — SODIUM CHLORIDE 0.9 % IV SOLN
1.0000 g | INTRAVENOUS | Status: DC
  Administered 2020-04-30: 1 g via INTRAVENOUS
  Filled 2020-04-30: qty 1
  Filled 2020-04-30 (×2): qty 10

## 2020-04-30 MED ORDER — LOSARTAN POTASSIUM 50 MG PO TABS: 100.0000 mg | ORAL_TABLET | Freq: Every day | ORAL | Status: DC

## 2020-04-30 MED ORDER — ACETAMINOPHEN 650 MG RE SUPP: 650.0000 mg | Freq: Four times a day (QID) | RECTAL | Status: DC | PRN

## 2020-04-30 MED ORDER — ALBUTEROL SULFATE (2.5 MG/3ML) 0.083% IN NEBU: 2.5000 mg | INHALATION_SOLUTION | RESPIRATORY_TRACT | Status: DC | PRN

## 2020-04-30 NOTE — ED Triage Notes (Signed)
Pt BIBA from home with SOB. Pt recently dc'd from hospital 2 days ago for acute respiratory failure with unknown cause. Pt wears a CPAP at night, but has increased SOB with exertion. Pt was 89% on room air upon EMS arrival.

## 2020-04-30 NOTE — ED Notes (Signed)
Pt stated that the room is hot. Pt informed that there is no thermostat in room. Pt given a personal fan.

## 2020-04-30 NOTE — Progress Notes (Signed)
  Echocardiogram 2D Echocardiogram has been performed.  Tricia Perry 04/30/2020, 2:23 PM

## 2020-04-30 NOTE — H&P (Signed)
Date: 04/30/2020               Patient Name:  Tricia Perry MRN: [id number]  DOB: [birth year] Age / Sex: 65 y.o., female   PCP: [name], [place]         Medical Service: Internal Medicine Teaching Service         Attending Physician: Dr. Lucious Groves, DO    First Contact: Dr. Johnney Ou Pager: [phone]  Second Contact: Dr. Marva Panda Pager: [phone]       After Hours (After 5p/  First Contact Pager: [phone]  weekends / holidays): Second Contact Pager: [phone]   Chief Complaint: Shortness of breath and hypoxia  History of Present Illness: 65 year old female with past medical history of DM type II, HTN, history of PE, obesity, OSA not compliant to CPAP, asthma, fibromyalgia, GERD, who presented to ED for shortness of breath and low oxygen level at home. She has felt more short of breath for past couple of weeks, some orthopnea. She has been seen in ED multiple times over last month for similar reason. She was also admitted in [facility] MICU 04/24/2020 for hypoxic and, hypercapnic respiratory failure and being altered. Required ICU admission and BiPAP there (she was BiPAP nave before). How ever, she left next day [place]. Since then, she has continued to feel short of breath mostly when she exert her self. She states that she checked her O2 sat at home and it has been around 70-80s in past couple of weeks. She says she las hx of rt knee surgery and lymphedema since then but also reports bilateral leg swelling some times. It comes and goes. No chest pain. No fever or chills. No palpitation. She has chronic intermittent diarrhea. No N/V. She is aware that she has OSA but she has not used C-PAP for a long time. She says she was not comfortable in [facility] and was not told what is wrong and she left AMA. She is willing to stay here and being admitted here though.  She is vaccinated and  boosted against COVID-19.   On EMS arrival, she found to  be hypoxic at 89%, that improved to 95% on 3 L nasal cannula. IMTS was consulted for admission.  CBC Latest Ref Rng & Units 04/30/2020 04/30/2020 04/21/2020  WBC 4.0 - 10.5 K/uL - 6.8 6.9  Hemoglobin 12.0 - 15.0 g/dL 16.0(H) 13.6 13.8  Hematocrit 36.0 - 46.0 % 47.0(H) 44.8 45.9  Platelets 150 - 400 K/uL - 283 299   CMP Latest Ref Rng & Units 04/30/2020 04/30/2020 04/21/2020  Glucose 70 - 99 mg/dL - 127(H) 124(H)  BUN 8 - 23 mg/dL - 7(L) 9  Creatinine 0.44 - 1.00 mg/dL - 0.52 0.50  Sodium 135 - 145 mmol/L 137 138 137  Potassium 3.5 - 5.1 mmol/L 4.5 4.2 4.4  Chloride 98 - 111 mmol/L - 97(L) 95(L)  CO2 22 - 32 mmol/L - 31 31  Calcium 8.9 - 10.3 mg/dL - 8.9 9.5  Total Protein 6.5 - 8.1 g/dL - 6.7 7.6  Total Bilirubin 0.3 - 1.2 mg/dL - 0.5 0.4  Alkaline Phos 38 - 126 U/L - 83 103  AST 15 - 41 U/L - 20 23  ALT 0 - 44 U/L - 24 22   Meds:  Home meds: Amlodipine, albuterol, aspirin, vitamin D2, Flonase, Synthroid, losartan, Metformin, montelukast  Meds:  Current Meds  Medication Sig  . amLODipine (NORVASC) 5  MG tablet Take 5 mg by mouth daily.  . cholecalciferol (VITAMIN D3) 25 MCG (1000 UNIT) tablet Take 1,000 Units by mouth daily.  . Ferrous Sulfate (IRON PO) Take 1 tablet by mouth daily.  Marland Kitchen levothyroxine (SYNTHROID, LEVOTHROID) 200 MCG tablet TAKE ONE TABLET BY MOUTH ONE TIME DAILY before breakfast  . losartan (COZAAR) 100 MG tablet Take 100 mg by mouth daily.  . metFORMIN (GLUCOPHAGE) 500 MG tablet Take 1 tablet (500 mg total) by mouth 2 (two) times daily with a meal. (Patient taking differently: Take 500 mg by mouth daily with breakfast.)  . montelukast (SINGULAIR) 10 MG tablet Take 10 mg by mouth in the morning.  Marland Kitchen VITAMIN A PO Take 2 capsules by mouth daily.  . vitamin C (ASCORBIC ACID) 500 MG tablet Take 500 mg by mouth daily.  . Vitamin D, Ergocalciferol, (DRISDOL) 1.25 MG (50000 UNIT) CAPS capsule Take 50,000 Units by mouth once a week. Take on Thursdays  . Vitamin E 100 units TABS  Take 1 tablet by mouth daily.     Allergies: Allergies as of 04/30/2020 - Review Complete 04/30/2020  Allergen Reaction Noted  . Gabapentin Swelling 08/02/2015  . Mold extract [trichophyton] Nausea And Vomiting and Other (See Comments) 05/31/2013  . Morphine Itching and Other (See Comments) 04/12/2010  . Codeine Itching and Other (See Comments) 04/12/2010   Past Medical History:  Diagnosis Date  . Arthritis    Knees; right-TKR, left-bone on bone/end stage  . Asthma   . Carpal tunnel syndrome on both sides 06/21/2010  . Carpal tunnel syndrome, bilateral   . Chronic nonallergic rhinitis    allergy eval NEG 10/2014: azelastine and flonase spray rx'd  . Chronic venous insufficiency    with edema  and extensive varicose dz: Dr. Linus Mako is managing this, planning laser procedures.; A LOT OF LEG CRAMPS AND LEG STIFFNESS  . Complication of anesthesia    STATES SHE WAS TOLD SHE WOKE UP DURING HER KNEE REPLACEMENT SURGERY AND HAD TO BE GIVEN MORE MEDICINE - NOT SURE IF SPINAL OR GENERAL ANESTHESIA  . DDD (degenerative disc disease)    Dr. [name] evaluated her and recommended pain mgmt MD.  She saw Dr. Selinda Orion for pain mgmt at one time.  . Diabetes mellitus without complication (HCC)    Type II  . Disequilibrium syndrome    MRI brain normal 2012  . DIZZINESS 04/12/2010   Qualifier: Diagnosis of  By: Anitra Lauth M.D., Brien Few   . Fibromyalgia    questionable  . GERD (gastroesophageal reflux disease)   . Heart murmur    functional, w/u done; PALPITATIONS IN THE PAST  . History of bronchitis   . History of palpitations   . History of pulmonary embolism 05/2013   Right sided.  Setting: post-op percutaneous nephrolithotomy--xarelto x 6 mo  . Hypertension   . Hypothyroidism   . Insomnia   . Insulin resistance   . Kidney stone on right side 12/2014   Dr. Jeffie Pollock considering PCNL, ESWL, and ureteroscopy as of 01/25/15   . Microscopic hematuria 2014   CT abd pelv showed 1.8 cm right  renal pelvis stone.  Also had UA c/w UTI so abx given.   . Mixed incontinence urge and stress    and nocturia x 5-6 per night  . Morbid obesity (HCC)    BMI 49.  Gastric stapling surgery in distant past.  . Nephrolithiasis    Lithotripsy 2002.  Right percut nephrolith 05/2013.  Marland Kitchen Nocturia   .  Numbness and tingling    bilateral hands and feet  . OAB (overactive bladder)    Per Dr. Jeffie Pollock: pt declined pelvic floor PT.  Vesicare trial started 01/25/15.  . Obesity hypoventilation syndrome ([place])   . Peripheral neuropathy    No old records to confirm pt's report.  Pt refuses to have more NCS b/c test is painful.  Marland Kitchen PONV (postoperative nausea and vomiting)   . Shortness of breath    CHRONIC; 3 mo trial off of ACE-I made no difference.  Spirometry x 2 has shown no obstruction.  Allergy w/u NEG.  . Shoulder pain    HX OF BILATERAL SHOULDER SURGERIES - PT HAS VERY LIMITED ROM - ESPECIALLY RAISING HER ARMS  . Sleep apnea    PT DOES NOT HAVE MASK OR TUBING - JUST SLEEPS WITH HEAD ELEVATED ON 2 PILLOWS   . Spinal stenosis    with L5 radiculopathy, also pseudospondylolisthesis per Dr. [name]  . Superficial thrombophlebitis of left leg 05/2013   with cellulitis--resolved approp with abx and ice  . Urinary incontinence   . Urinary urgency   . Vertigo     Family History:  Family History  Problem Relation Age of Onset  . Hypertension Mother   . Cancer Mother        Brain/Lung/ smoker  . Diabetes Mother   . Stroke Father   . Hypertension Father   . ADD / ADHD Daughter        ADHD  . Thyroid disease Brother   . Hypertension Brother   . Varicose Veins Brother   . Peripheral vascular disease Brother   . Heart attack Maternal Grandfather   . Alcohol abuse Paternal Grandfather   . Breast cancer Maternal Grandmother   . Breast cancer Paternal Aunt   . Breast cancer Paternal Aunt      Social History: Denies smoking. No illicit drug use. No alcohol use  Review of Systems: A complete ROS was  negative except as per HPI.   Physical Exam: Blood pressure (!) 141/87, pulse 92, temperature 97.8 F (36.6 C), temperature source Oral, resp. rate (!) 24, SpO2 99 %. Physical Exam Constitutional:      General: She is not in acute distress.    Appearance: She is obese. She is not ill-appearing or diaphoretic.  Neck:     Vascular: No JVD.  Cardiovascular:     Rate and Rhythm: Normal rate and regular rhythm.     Heart sounds: Murmur heard.    Pulmonary:     Effort: Pulmonary effort is normal. Tachypnea present. No respiratory distress.     Breath sounds: Decreased breath sounds present. No wheezing or rales.  Abdominal:     Palpations: Abdomen is soft.     Tenderness: There is no abdominal tenderness.  Musculoskeletal:     Right lower leg: Edema present.     Left lower leg: Edema present.     Comments: Non pitting edema of rt leg. 1-2+ pitting edema of left leg  Skin:    General: Skin is warm and dry.  Neurological:     Mental Status: She is alert and oriented to person, place, and time.  Psychiatric:        Mood and Affect: Mood normal.        Behavior: Behavior normal.     EKG: personally reviewed my interpretation is sinus tachycardia. P wave pattern suggestive of LA +/- RA enlargement   CXR: personally reviewed my interpretation is personally reviewed  my interpretation is rotated. cardiomegaly, vascular congestion vs mediastinal widening? PA CXR is pending.  Assessment & Plan by Problem: Active Problems:   OSA (obstructive sleep apnea)   Acute on chronic respiratory failure (HCC)   Morbid obesity (HCC)  Acute hypoxic respiratory failure: Presumed obesity Hypoventilation Syndrome  Untreated OSA   Multiple ED visit in different facilities. Left AMA few times. Also was admitted [facility] in MICU and was placed on BiPAP, 04/24/2020 but left AMA the next day.Per chart review, she has had new hypoxia recently. (Also chronic hypercapnea) Her recurrent  respiratory failure thought to be secondary to obesity hypoventilation syndrome and untreated OSA.  Per chart review from December and January, not compliant medical advices.  Not on Oxygen at home. She states she has a pulse Oxymetry at home and her O2 level has been 70-80% sometimes in past few weeks. Has an old C-PAP machine but has not used that for a while  -Now saturating 100% on 3 li. Still mildly tachypneic but not in respiratory distress. -Covid negative -BNP normal at 24 -Ordering stat VBG (Not done yet) -Recent CTA in [facility] did not show evidence of PE and no pneumonia -AP CXR with rotation. Ordering PA and lateral CXR -Will need C-PAP for home -Echo (no echo in chart from recent hospitalization) -C-PAP at night -PRN BiPAP (Stat VBG pending from time of admission. Asked RN to draw) -Given recent new hypoxia, consulting pulm for more evaluation. Appreciate recommendations. ADDENDUM: Rt side opacity and probable rt side cardiomegaly in PA CXR. Recent CT A in [place] forser did not report pulm infiltration. Ordering Echo  DM 2: -SSI -HbA1c  Diet: HHCM IV fluid: None VTE ppx: Lovenox Code status: Full  Dispo: Admit patient to Observation with expected length of stay less than 2 midnights.  SignedDewayne Hatch, MD 04/30/2020, 2:57 PM  Pager: [phone] After 5pm on weekdays and 1pm on weekends: On Call pager: [phone]

## 2020-04-30 NOTE — Consult Note (Signed)
NAME:  Tricia Perry, MRN:  [id number], DOB:  01-Jan-1956, LOS: 0 ADMISSION DATE:  04/30/2020, CONSULTATION DATE:  04/30/2020 REFERRING MD:  Dr. Myrtie Hawk, CHIEF COMPLAINT:  SOB  Brief History:  65 year old female with morbid obesity, untreated OSA, and suspected OHS presenting with recurrent hypoxia, requiring O2 3L.  Pulmonary consulted for further recommendations.   History of Present Illness:   65 year old female with prior history of former smoker, asthma, DMT2, morbid obesity (BMI 62.31kg/m2), GERD, OSA- untreated, HTN, hypothyroidism, DDD, fibromyalgia, chronic venous insufficiency, arthritis, and PE 2015.   She has had multiple recent evaluations.  Seen in ER at [facility] 1/20 and [facility] 1/22 for shortness of breath found to be hypoxic requiring 3-4L of O2, recommended admission, but left AMA both times. Seen in our office on 1/25 by Dr. Loanne Drilling, previously seen by Dr. Melvyn Novas years ago, with room air sats 83% with recommendations for EMS transport but declined.  She was then admitted to [facility] 1/26 with hypercapnic hypoxic respiratory failure felt secondary to untreated OSA and likely OHS.  She was altered and hypercarbic in the 90's treated with BiPAP with improvement, however left AMA the next day.  CTA negative for PE or PNA at that time.  Echo was negative.   She returns again with ongoing shortness of breath.  Denies fever, has mild dry cough, denies any chest pain, leg swelling/ pain.  She is not on home oxygen, has an old CPAP at home but doesn't seem to work.  Her mobility is limited due to exertional shortness of breath and uses a walker at home.  Found to be 89% on room air with EMS. CXR today shows patchy opacity in right lower lobe, atelectasis vs pneumonia with possible right pleural effusion and mild pulmonary vascular congestion. WBC 6.8, BNP 24, afebrile and hemodynamically stable in ER.  Currently on 3L [place].  Pulmonary consulted for further chronic hypoxic respiratory failure, OSA,  supsected OHS recommendations.   Past Medical History:  Former smoker, Asthma, DMT2, morbid obesity (BMI 62.31kg/m2), GERD, OSA- untreated, HTN, hypothyroidism, DDD, fibromyalgia, chronic venous insufficiency, arthritis, PE 2015 COVID vaccinated w/booster  Significant Hospital Events:  1/31 Admit IMTS  Consults:  Pulmonary   Procedures:   Significant Diagnostic Tests:   1/26 CTA PE  1. Image quality degraded by motion. Negative for pulmonary emboli  2. Lungs are clear without infiltrate or effusion.  3. Right renal atrophy with nonobstructing calculi.   1/31 TTE >>  Micro Data:  1/31 SARS  >>neg  Antimicrobials:   ceftx 1/31 >>  Interim History / Subjective:    Objective   Blood pressure (!) 141/87, pulse 92, temperature 97.8 F (36.6 C), temperature source Oral, resp. rate (!) 24, SpO2 99 %.       No intake or output data in the 24 hours ending 04/30/20 1346 There were no vitals filed for this visit.  Examination: Gen. Pleasant, obese, in no distress, normal affect ENT - no pallor,icterus, no post nasal drip, class 2-3 airway Neck: No JVD, no thyromegaly, no carotid bruits Lungs: no use of accessory muscles, no dullness to percussion, decreased without rales or rhonchi  Cardiovascular: Rhythm regular, heart sounds  normal, no murmurs or gallops, 1+ peripheral edema Abdomen: soft and non-tender, no hepatosplenomegaly, BS normal. Musculoskeletal: No deformities, no cyanosis or clubbing Neuro:  alert, non focal, no tremors   Resolved Hospital Problem list     Assessment & Plan:   Acute Hypoxic respiratory failure  OSA , noncompliant with CPAP Suspected OHS Morbid Obesity - BMI 62  - echo >>previous in 2015 noted for normal EF, G2DD, and elevated PAP 37 mmHg; most recent TTE at [facility] LVEF -Chest x-ray independently reviewed which shows right lower lobe infiltrate consistent with CAP, curious that this was not present on CT done few days ago -Her ABG  of 7.31/75 on 1/21 current VBG demonstrates that she has baseline hypercarbia in the low 50 range, her serum bicarbonate of 31 also indicates chronic compensated hypercarbia .  This is consistent with a diagnosis of obesity hypoventilation syndrome.  Previous PFTs in 2015  have not shown any evidence of airway obstruction -She only has mild pedal edema and does not appear to be significantly fluid overloaded, BNP can be misleading in the superobese patient  Recommend -IV ceftriaxone 1 g every 24, check urine Legionella and strep antigen for completion, would complete 7 days of antibiotics even though no leukocytosis -I impressed upon her the need to use her CPAP every night religiously, she has 3 pillow orthopnea and does have a full facemask, her hospital admission may provide opportunity to find her a better fitting full facemask.  Can use nocturnal BiPAP during hospital stay -Gentle diuresis as renal function would permit -Reassess need for oxygen on discharge   Remainder per IMTS  Code Status: Full  Labs   CBC: Recent Labs  Lab 04/30/20 0652 04/30/20 1302  WBC 6.8  --   NEUTROABS 4.5  --   HGB 13.6 16.0*  HCT 44.8 47.0*  MCV 101.6*  --   PLT 283  --     Basic Metabolic Panel: Recent Labs  Lab 04/30/20 0652 04/30/20 1302  NA 138 137  K 4.2 4.5  CL 97*  --   CO2 31  --   GLUCOSE 127*  --   BUN 7*  --   CREATININE 0.52  --   CALCIUM 8.9  --    GFR: Estimated Creatinine Clearance: 115 mL/min (by C-G formula based on SCr of 0.52 mg/dL). Recent Labs  Lab 04/30/20 0652  WBC 6.8    Liver Function Tests: Recent Labs  Lab 04/30/20 0652  AST 20  ALT 24  ALKPHOS 83  BILITOT 0.5  PROT 6.7  ALBUMIN 3.5   No results for input(s): LIPASE, AMYLASE in the last 168 hours. No results for input(s): AMMONIA in the last 168 hours.  ABG    Component Value Date/Time   HCO3 37.8 (H) 04/30/2020 1302   TCO2 40 (H) 04/30/2020 1302   O2SAT 70.0 04/30/2020 1302      Coagulation Profile: No results for input(s): INR, PROTIME in the last 168 hours.  Cardiac Enzymes: No results for input(s): CKTOTAL, CKMB, CKMBINDEX, TROPONINI in the last 168 hours.  HbA1C: Hgb A1c MFr Bld  Date/Time Value Ref Range Status  11/06/2015 02:52 PM 5.9 (H) 4.8 - 5.6 % Final    Comment:    (NOTE)         Pre-diabetes: 5.7 - 6.4         Diabetes: >6.4         Glycemic control for adults with diabetes: <7.0   05/18/2014 02:48 PM 6.2 4.6 - 6.5 % Final    Comment:    Glycemic Control Guidelines for People with Diabetes:Non Diabetic:  <6%Goal of Therapy: <7%Additional Action Suggested:  >8%     CBG: No results for input(s): GLUCAP in the last 168 hours.  Review of Systems:  Shortness of breath No fevers, cough, chest pain 3 pillow orthopnea, no PND or chest pain or palpitations Reports chronic pedal edema No nausea vomiting or diarrhea  Past Medical History:  She,  has a past medical history of Arthritis, Asthma, Carpal tunnel syndrome on both sides (06/21/2010), Carpal tunnel syndrome, bilateral, Chronic nonallergic rhinitis, Chronic venous insufficiency, Complication of anesthesia, DDD (degenerative disc disease), Diabetes mellitus without complication ([place]), Disequilibrium syndrome, DIZZINESS (04/12/2010), Fibromyalgia, GERD (gastroesophageal reflux disease), Heart murmur, History of bronchitis, History of palpitations, History of pulmonary embolism (05/2013), Hypertension, Hypothyroidism, Insomnia, Insulin resistance, Kidney stone on right side (12/2014), Microscopic hematuria (2014), Mixed incontinence urge and stress, Morbid obesity ([place]), Nephrolithiasis, Nocturia, Numbness and tingling, OAB (overactive bladder), Obesity hypoventilation syndrome ([place]), Peripheral neuropathy, PONV (postoperative nausea and vomiting), Shortness of breath, Shoulder pain, Sleep apnea, Spinal stenosis, Superficial thrombophlebitis of left leg (05/2013), Urinary incontinence, Urinary  urgency, and Vertigo.   Surgical History:   Past Surgical History:  Procedure Laterality Date  . BILATERAL SHOULDER SURGERY    . BREAST EXCISIONAL BIOPSY Left 11/12/2015  . BREAST LUMPECTOMY WITH RADIOACTIVE SEED LOCALIZATION Left 11/12/2015   Procedure: LEFT BREAST LUMPECTOMY WITH RADIOACTIVE SEED LOCALIZATION;  Surgeon: Autumn Messing III, MD;  Location: [facility];  Service: General;  Laterality: Left;  . CHOLECYSTECTOMY  1987  . ENDOMETRIAL ABLATION     menorrhagia  . EXTRACORPOREAL SHOCK WAVE LITHOTRIPSY    . FOOT SURGERY  2000   Right tarsel tunnel release  . GASTRIC RESTRICTION SURGERY  remote past  . HERNIA REPAIR   1980's   Diaphragmatic + gastric stapling  . HERNIA REPAIR     Inguinal  . JOINT REPLACEMENT  2009   Right Knee, [facility]  . NEPHROLITHOTOMY Right 05/31/2013   Procedure: NEPHROLITHOTOMY PERCUTANEOUS;  Surgeon: Irine Seal, MD;  Location: [facility];  Service: Urology;  Laterality: Right;  . PFT's  09/2013; 12/2013   09/2013 mild restriction.  12/2013 Low vital capacity, possibly due to restriction from pt's body habitus.  . TRANSTHORACIC ECHOCARDIOGRAM  12/16/13   EF 31-54%, grade 2 diastolic dysfxn, PA pressure 37 (mildly high)     Social History:   reports that she quit smoking about 22 years ago. She smoked 0.00 packs per day for 0.00 years. She has never used smokeless tobacco. She reports current alcohol use. She reports that she does not use drugs.   Family History:  Her family history includes ADD / ADHD in her daughter; Alcohol abuse in her paternal grandfather; Breast cancer in her maternal grandmother, paternal aunt, and paternal aunt; Cancer in her mother; Diabetes in her mother; Heart attack in her maternal grandfather; Hypertension in her brother, father, and mother; Peripheral vascular disease in her brother; Stroke in her father; Thyroid disease in her brother; Varicose Veins in her brother.   Allergies Allergies  Allergen Reactions  . Gabapentin Swelling     Legs   . Mold Extract [Trichophyton] Nausea And Vomiting and Other (See Comments)    Sneezing real bad   . Morphine Itching and Other (See Comments)    back hurts  . Codeine Itching and Other (See Comments)    Back hurts     Home Medications  Prior to Admission medications   Medication Sig Start Date End Date Taking? Authorizing Provider  amLODipine (NORVASC) 5 MG tablet Take 5 mg by mouth daily. 02/14/20  Yes [provider]  cholecalciferol (VITAMIN D3) 25 MCG (1000 UNIT) tablet Take 1,000 Units by mouth daily.  Yes [provider]  Ferrous Sulfate (IRON PO) Take 1 tablet by mouth daily.   Yes [provider]  levothyroxine (SYNTHROID, LEVOTHROID) 200 MCG tablet TAKE ONE TABLET BY MOUTH ONE TIME DAILY before breakfast 11/07/14  Yes McGowen, Adrian Blackwater, MD  losartan (COZAAR) 100 MG tablet Take 100 mg by mouth daily. 02/14/20  Yes [provider]  metFORMIN (GLUCOPHAGE) 500 MG tablet Take 1 tablet (500 mg total) by mouth 2 (two) times daily with a meal. Patient taking differently: Take 500 mg by mouth daily with breakfast. 07/21/14  Yes McGowen, Adrian Blackwater, MD  montelukast (SINGULAIR) 10 MG tablet Take 10 mg by mouth in the morning.   Yes [provider]  VITAMIN A PO Take 2 capsules by mouth daily.   Yes [provider]  vitamin C (ASCORBIC ACID) 500 MG tablet Take 500 mg by mouth daily.   Yes [provider]  Vitamin D, Ergocalciferol, (DRISDOL) 1.25 MG (50000 UNIT) CAPS capsule Take 50,000 Units by mouth once a week. Take on Thursdays 04/12/20  Yes [provider]  Vitamin E 100 units TABS Take 1 tablet by mouth daily.   Yes [provider]  azelastine (ASTELIN) 0.1 % nasal spray Place 1-2 sprays into both nostrils 2 (two) times daily as needed for allergies. Reported on 08/09/2015 Patient not taking: No sig reported 11/28/14   [provider]  cyclobenzaprine (FLEXERIL) 10 MG tablet Take 1 tablet (10 mg  total) by mouth 3 (three) times daily as needed for muscle spasms. Patient not taking: No sig reported 03/16/19   Veryl Speak, MD  dicyclomine (BENTYL) 20 MG tablet Take 1 tablet (20 mg total) by mouth 3 (three) times daily before meals. Patient not taking: No sig reported 07/27/16   Orpah Greek, MD  docusate sodium (COLACE) 100 MG capsule Take 1 capsule (100 mg total) by mouth every 12 (twelve) hours. Patient not taking: No sig reported 07/27/16   Orpah Greek, MD  furosemide (LASIX) 40 MG tablet Take 1 tablet (40 mg total) by mouth daily. 03/25/16 03/30/16  Leo Grosser, MD  lactulose (CHRONULAC) 10 GM/15ML solution Take 15 mLs (10 g total) by mouth 2 (two) times daily as needed for mild constipation or moderate constipation. Patient not taking: No sig reported 07/27/16   Orpah Greek, MD  methocarbamol (ROBAXIN) 500 MG tablet Take 1 tablet (500 mg total) by mouth 2 (two) times daily. Patient not taking: No sig reported 08/19/17   Ward, Ozella Almond, PA-C  promethazine (PHENERGAN) 25 MG tablet Take 1 tablet (25 mg total) by mouth every 6 (six) hours as needed for nausea or vomiting. Patient not taking: No sig reported 07/27/16   Orpah Greek, MD     Kara Mead MD. FCCP. [facility] See Amion for pager  If no response to pager , please call [phone] until 7 pm After 7:00 pm call Elink  [phone]   04/30/2020

## 2020-04-30 NOTE — ED Provider Notes (Signed)
I provided a substantive portion of the care of this patient.  I personally performed the entirety of the history for this encounter.  EKG Interpretation  Date/Time:  Monday April 30 2020 06:01:57 EST Ventricular Rate:  102 PR Interval:    QRS Duration: 77 QT Interval:  311 QTC Calculation: 405 R Axis:   -12 Text Interpretation: Sinus tachycardia Low voltage, precordial leads Abnormal R-wave progression, early transition No significant change since last tracing Confirmed by Ripley Fraise [phone]) on 04/30/2020 6:03:22 AM  Patient has had recurrent shortness of breath with recent discharge from [facility].  Patient when she has had mild dry cough.  Intermittent in nature.  No fevers.  Mobility significantly limited and uses walker for ambulation.  Patient is not on home oxygen.  Has CPAP but reports it does not seem to be working.  Alert and nontoxic.  Tachypnea mild increased work of breath at rest.  Heart regular.  No gross rub murmur gallop.  Fine crackles at lung bases.  No significant peripheral edema of ankles and feet.  Patient presents with hypoxia, tachypnea and persistent shortness of breath.  Agree with plan and management.   Charlesetta Shanks, MD 04/30/20 [phone]

## 2020-04-30 NOTE — Hospital Course (Addendum)
This morning sitting up by bed. Reports good urine output overnight. Moved rooms last night due room being too hot now feeling better. Patient is eager to make life changes to move around more and regain ability to garden again. Discussed plan to arrange and portable O2 and home PT to help with mobility.     2/3 Feels machine is helping with her breathing but having difficulty getting used to it. Discussed plant to go home on 2L and trelegy at home. Breathing feels better with diuresis. Has a PCP encouraged to follow up next week. No acute complaints.

## 2020-04-30 NOTE — ED Provider Notes (Signed)
[facility] EMERGENCY DEPARTMENT Provider Note   CSN: [id number] Arrival date & time: 04/30/20  0557     History Chief Complaint  Patient presents with  . Shortness of Breath    Tricia Perry is a 65 y.o. female.  HPI 65 year old female with a history of DM type II, fibromyalgia, GERD, obesity, asthma presents to the ER with complaints of shortness of breath.  Patient has been seen in multiple ED's for this complaint over the last month, was admitted to [facility] MICU per chart review with respiratory failure of unknown cause.  She was noted to be hypercapnic and altered, was placed on BiPAP in the ICU.  She left AGAINST MEDICAL ADVICE on 1/26 citing "she was uncomfortable and wanted to be home with her dog".  She has continued to feel short of breath, was not sent home on any supplemental oxygen.  She states that she has been compliant with her CPAP at night as she was told this when she left the hospital, but still feels like her breathing has not improved and feels a little worse than when she left the hospital.  Denies any fevers or chills.  She is vaccinated and boosted for COVID-19.  She states that she was told that her lungs were "normal".  She has not followed up with a PCP or any pulmonologist.  She was not home with supplemental O2.  Noted to be hypoxic at 89% with EMS.  She was placed on 3 L nasal cannula, sats at 95% here in the ED.  Per chart review, she did have a CTA of the chest done which did not show any PE or pulmonary vascular congestion.  Unclear if she had an echocardiogram done as records are limited.  She reports that she is now ready to be admitted if needed to get her "breathing sorted out".  She denies any chest pain, no noticeable leg swelling, fevers, productive cough, or infectious symptoms.    Past Medical History:  Diagnosis Date  . Arthritis    Knees; right-TKR, left-bone on bone/end stage  . Asthma   . Carpal tunnel syndrome on  both sides 06/21/2010  . Carpal tunnel syndrome, bilateral   . Chronic nonallergic rhinitis    allergy eval NEG 10/2014: azelastine and flonase spray rx'd  . Chronic venous insufficiency    with edema  and extensive varicose dz: Dr. Linus Mako is managing this, planning laser procedures.; A LOT OF LEG CRAMPS AND LEG STIFFNESS  . Complication of anesthesia    STATES SHE WAS TOLD SHE WOKE UP DURING HER KNEE REPLACEMENT SURGERY AND HAD TO BE GIVEN MORE MEDICINE - NOT SURE IF SPINAL OR GENERAL ANESTHESIA  . DDD (degenerative disc disease)    Dr. [name] evaluated her and recommended pain mgmt MD.  She saw Dr. Selinda Orion for pain mgmt at one time.  . Diabetes mellitus without complication (HCC)    Type II  . Disequilibrium syndrome    MRI brain normal 2012  . DIZZINESS 04/12/2010   Qualifier: Diagnosis of  By: Anitra Lauth M.D., Brien Few   . Fibromyalgia    questionable  . GERD (gastroesophageal reflux disease)   . Heart murmur    functional, w/u done; PALPITATIONS IN THE PAST  . History of bronchitis   . History of palpitations   . History of pulmonary embolism 05/2013   Right sided.  Setting: post-op percutaneous nephrolithotomy--xarelto x 6 mo  . Hypertension   . Hypothyroidism   .  Insomnia   . Insulin resistance   . Kidney stone on right side 12/2014   Dr. Jeffie Pollock considering PCNL, ESWL, and ureteroscopy as of 01/25/15   . Microscopic hematuria 2014   CT abd pelv showed 1.8 cm right renal pelvis stone.  Also had UA c/w UTI so abx given.   . Mixed incontinence urge and stress    and nocturia x 5-6 per night  . Morbid obesity (HCC)    BMI 49.  Gastric stapling surgery in distant past.  . Nephrolithiasis    Lithotripsy 2002.  Right percut nephrolith 05/2013.  Marland Kitchen Nocturia   . Numbness and tingling    bilateral hands and feet  . OAB (overactive bladder)    Per Dr. Jeffie Pollock: pt declined pelvic floor PT.  Vesicare trial started 01/25/15.  . Obesity hypoventilation syndrome ([place])   .  Peripheral neuropathy    No old records to confirm pt's report.  Pt refuses to have more NCS b/c test is painful.  Marland Kitchen PONV (postoperative nausea and vomiting)   . Shortness of breath    CHRONIC; 3 mo trial off of ACE-I made no difference.  Spirometry x 2 has shown no obstruction.  Allergy w/u NEG.  . Shoulder pain    HX OF BILATERAL SHOULDER SURGERIES - PT HAS VERY LIMITED ROM - ESPECIALLY RAISING HER ARMS  . Sleep apnea    PT DOES NOT HAVE MASK OR TUBING - JUST SLEEPS WITH HEAD ELEVATED ON 2 PILLOWS   . Spinal stenosis    with L5 radiculopathy, also pseudospondylolisthesis per Dr. [name]  . Superficial thrombophlebitis of left leg 05/2013   with cellulitis--resolved approp with abx and ice  . Urinary incontinence   . Urinary urgency   . Vertigo     Patient Active Problem List   Diagnosis Date Noted  . Acute hypoxemic respiratory failure ([place]) 04/24/2020  . Hypoxia 04/21/2020  . Maxillary sinusitis 02/19/2015  . OSA (obstructive sleep apnea) 12/13/2013  . Hypothyroidism 12/04/2013  . Varicose veins of lower extremities with other complications 98/92/1194  . Insomnia 06/13/2013  . Pulmonary embolism ([place]) 05/29/2013  . Shortness of breath 04/20/2013  . Insulin resistance 11/29/2012  . HTN (hypertension) 11/29/2012  . Vitamin B12 deficiency 11/29/2012  . Chronic pain 07/11/2010  . Chronic venous insufficiency 06/21/2010  . B12 DEFICIENCY 05/10/2010  . IRRITABLE BOWEL SYNDROME 05/10/2010  . MALABSORPTION SYNDROME 05/10/2010  . ESSENTIAL HYPERTENSION 04/12/2010    Past Surgical History:  Procedure Laterality Date  . BILATERAL SHOULDER SURGERY    . BREAST EXCISIONAL BIOPSY Left 11/12/2015  . BREAST LUMPECTOMY WITH RADIOACTIVE SEED LOCALIZATION Left 11/12/2015   Procedure: LEFT BREAST LUMPECTOMY WITH RADIOACTIVE SEED LOCALIZATION;  Surgeon: Autumn Messing III, MD;  Location: [facility];  Service: General;  Laterality: Left;  . CHOLECYSTECTOMY  1987  . ENDOMETRIAL ABLATION     menorrhagia   . EXTRACORPOREAL SHOCK WAVE LITHOTRIPSY    . FOOT SURGERY  2000   Right tarsel tunnel release  . GASTRIC RESTRICTION SURGERY  remote past  . HERNIA REPAIR   1980's   Diaphragmatic + gastric stapling  . HERNIA REPAIR     Inguinal  . JOINT REPLACEMENT  2009   Right Knee, [facility]  . NEPHROLITHOTOMY Right 05/31/2013   Procedure: NEPHROLITHOTOMY PERCUTANEOUS;  Surgeon: Irine Seal, MD;  Location: [facility];  Service: Urology;  Laterality: Right;  . PFT's  09/2013; 12/2013   09/2013 mild restriction.  12/2013 Low vital capacity, possibly due to restriction from  pt's body habitus.  . TRANSTHORACIC ECHOCARDIOGRAM  12/16/13   EF 123456, grade 2 diastolic dysfxn, PA pressure 37 (mildly high)     OB History   No obstetric history on file.     Family History  Problem Relation Age of Onset  . Hypertension Mother   . Cancer Mother        Brain/Lung/ smoker  . Diabetes Mother   . Stroke Father   . Hypertension Father   . ADD / ADHD Daughter        ADHD  . Thyroid disease Brother   . Hypertension Brother   . Varicose Veins Brother   . Peripheral vascular disease Brother   . Heart attack Maternal Grandfather   . Alcohol abuse Paternal Grandfather   . Breast cancer Maternal Grandmother   . Breast cancer Paternal Aunt   . Breast cancer Paternal Aunt     Social History   Tobacco Use  . Smoking status: Former Smoker    Packs/day: 0.00    Years: 0.00    Pack years: 0.00    Quit date: 03/31/1998    Years since quitting: 22.0  . Smokeless tobacco: Never Used  Substance Use Topics  . Alcohol use: Yes    Comment: social  . Drug use: No    Home Medications Prior to Admission medications   Medication Sig Start Date End Date Taking? Authorizing Provider  acetaminophen (TYLENOL) 500 MG tablet Take 1,000 mg by mouth every 6 (six) hours as needed for mild pain or headache.  Patient not taking: Reported on 04/24/2020    [provider]  AMLODIPINE BESYLATE PO Take 10 mg by mouth.     [provider]  amlodipine-atorvastatin (CADUET) 10-10 MG tablet Take 1 tablet by mouth daily. Patient not taking: Reported on 04/24/2020    [provider]  aspirin EC 81 MG tablet Take 81 mg by mouth every 6 (six) hours as needed for mild pain.  Patient not taking: Reported on 04/24/2020 12/02/13   Tammi Sou, MD  azelastine (ASTELIN) 0.1 % nasal spray Place 1-2 sprays into both nostrils 2 (two) times daily as needed for allergies. Reported on 08/09/2015 Patient not taking: Reported on 04/24/2020 11/28/14   [provider]  Cholecalciferol 1.25 MG (50000 UT) capsule cholecalciferol (vitamin D3) 1,250 mcg (50,000 unit) capsule  TAKE 1 CAPSULE BY MOUTH WEEKLY    [provider]  cyclobenzaprine (FLEXERIL) 10 MG tablet Take 1 tablet (10 mg total) by mouth 3 (three) times daily as needed for muscle spasms. Patient not taking: Reported on 04/24/2020 03/16/19   Veryl Speak, MD  dicyclomine (BENTYL) 20 MG tablet Take 1 tablet (20 mg total) by mouth 3 (three) times daily before meals. Patient not taking: Reported on 04/24/2020 07/27/16   Orpah Greek, MD  docusate sodium (COLACE) 100 MG capsule Take 1 capsule (100 mg total) by mouth every 12 (twelve) hours. Patient not taking: Reported on 04/24/2020 07/27/16   Orpah Greek, MD  fluticasone [facility]) 50 MCG/ACT nasal spray Place 2 sprays into both nostrils daily. Reported on 08/09/2015 Patient not taking: Reported on 04/24/2020 11/28/14   [provider]  furosemide (LASIX) 40 MG tablet Take 1 tablet (40 mg total) by mouth daily. 03/25/16 03/30/16  Leo Grosser, MD  irbesartan (AVAPRO) 300 MG tablet Take 300 mg by mouth daily. Patient not taking: Reported on 04/24/2020 09/28/15   [provider]  irbesartan (AVAPRO) 300 MG tablet Take 300 mg by mouth  daily. Patient not taking: Reported on 04/24/2020    [provider]  lactulose (CHRONULAC) 10 GM/15ML solution Take 15 mLs  (10 g total) by mouth 2 (two) times daily as needed for mild constipation or moderate constipation. Patient not taking: Reported on 04/24/2020 07/27/16   Orpah Greek, MD  levothyroxine (SYNTHROID, LEVOTHROID) 200 MCG tablet TAKE ONE TABLET BY MOUTH ONE TIME DAILY before breakfast 11/07/14   McGowen, Adrian Blackwater, MD  losartan (COZAAR) 50 MG tablet Take 50 mg by mouth daily.    [provider]  metFORMIN (GLUCOPHAGE) 500 MG tablet Take 1 tablet (500 mg total) by mouth 2 (two) times daily with a meal. Patient taking differently: Take 500 mg by mouth daily with breakfast. 07/21/14   McGowen, Adrian Blackwater, MD  methocarbamol (ROBAXIN) 500 MG tablet Take 1 tablet (500 mg total) by mouth 2 (two) times daily. Patient not taking: Reported on 04/24/2020 08/19/17   Ward, [place] Cerise Pilcher, PA-C  montelukast (SINGULAIR) 10 MG tablet Take 10 mg by mouth at bedtime.    [provider]  ondansetron (ZOFRAN) 4 MG tablet Take 4 mg by mouth every 8 (eight) hours as needed for nausea or vomiting. Patient not taking: Reported on 04/24/2020    [provider]  oxyCODONE (ROXICODONE) 15 MG immediate release tablet Take 15 mg by mouth 4 (four) times daily. FOR BACK PAIN AND ARTHRITIS AND RT KNEE PAIN Patient not taking: Reported on 04/24/2020    [provider]  pravastatin (PRAVACHOL) 20 MG tablet Take 20 mg by mouth daily. Patient not taking: Reported on 04/24/2020    [provider]  promethazine (PHENERGAN) 25 MG tablet Take 1 tablet (25 mg total) by mouth every 6 (six) hours as needed for nausea or vomiting. Patient not taking: Reported on 04/24/2020 07/27/16   Orpah Greek, MD    Allergies    Gabapentin, Mold extract [trichophyton], Morphine, and Codeine  Review of Systems   Review of Systems  Constitutional: Negative for chills and fever.  HENT: Negative for ear pain and sore throat.   Eyes: Negative for pain and visual disturbance.  Respiratory: Positive for  shortness of breath. Negative for cough.   Cardiovascular: Negative for chest pain and palpitations.  Gastrointestinal: Negative for abdominal pain and vomiting.  Genitourinary: Negative for dysuria and hematuria.  Musculoskeletal: Negative for arthralgias and back pain.  Skin: Negative for color change and rash.  Neurological: Negative for seizures and syncope.  All other systems reviewed and are negative.   Physical Exam Updated Vital Signs BP (!) 141/87   Pulse 92   Temp 97.8 F (36.6 C) (Oral)   Resp (!) 24   SpO2 99%   Physical Exam Constitutional:      Appearance: Normal appearance. She is obese. She is ill-appearing (Chronically ill-appearing).  HENT:     Head: Normocephalic and atraumatic.  Eyes:     Extraocular Movements: Extraocular movements intact.     Conjunctiva/sclera: Conjunctivae normal.     Pupils: Pupils are equal, round, and reactive to light.  Cardiovascular:     Rate and Rhythm: Normal rate and regular rhythm.     Pulses: Normal pulses.     Heart sounds: Normal heart sounds.  Pulmonary:     Effort: Pulmonary effort is normal.     Breath sounds: Decreased breath sounds present.     Comments: Tachypneic, speaking in short sentences.  No audible wheezes.  Mild crackles at the lung bases. Chest:  Chest wall: No tenderness.  Abdominal:     General: Abdomen is flat. Bowel sounds are normal. There is no distension.     Palpations: Abdomen is soft. There is no mass.     Tenderness: There is no abdominal tenderness. There is no right CVA tenderness, left CVA tenderness, guarding or rebound.  Musculoskeletal:        General: Normal range of motion.     Cervical back: Normal range of motion. No tenderness.  Skin:    General: Skin is warm and dry.  Neurological:     General: No focal deficit present.     Mental Status: She is alert and oriented to person, place, and time.  Psychiatric:        Mood and Affect: Mood normal.        Behavior: Behavior  normal.     ED Results / Procedures / Treatments   Labs (all labs ordered are listed, but only abnormal results are displayed) Labs Reviewed  CBC WITH DIFFERENTIAL/PLATELET - Abnormal; Notable for the following components:      Result Value   MCV 101.6 (*)    All other components within normal limits  COMPREHENSIVE METABOLIC PANEL - Abnormal; Notable for the following components:   Chloride 97 (*)    Glucose, Bld 127 (*)    BUN 7 (*)    All other components within normal limits  SARS CORONAVIRUS 2 BY RT PCR (HOSPITAL ORDER, [facility])  BRAIN NATRIURETIC PEPTIDE  BLOOD GAS, VENOUS    EKG EKG Interpretation  Date/Time:  Monday April 30 2020 06:01:57 EST Ventricular Rate:  102 PR Interval:    QRS Duration: 77 QT Interval:  311 QTC Calculation: 405 R Axis:   -12 Text Interpretation: Sinus tachycardia Low voltage, precordial leads Abnormal R-wave progression, early transition No significant change since last tracing Confirmed by Ripley Fraise [phone]) on 04/30/2020 6:03:22 AM   Radiology DG Chest Portable 1 View  Result Date: 04/30/2020 CLINICAL DATA:  Shortness of breath EXAM: PORTABLE CHEST 1 VIEW COMPARISON:  04/21/2020 FINDINGS: Cardiomegaly and vascular pedicle widening primarily from mediastinal fat and rotation based on recent CT. Hazy appearance of the bilateral chest with suspected pleural effusion on the right at least. No pneumothorax. IMPRESSION: Cardiomegaly and vascular congestion. Asymmetric density at the right base could be from pleural fluid or airspace disease. Electronically Signed   By: Monte Fantasia M.D.   On: 04/30/2020 07:03    Procedures Procedures   Medications Ordered in ED Medications - No data to display  ED Course  I have reviewed the triage vital signs and the nursing notes.  Pertinent labs & imaging results that were available during my care of the patient were reviewed by me and considered in my medical  decision making (see chart for details).    MDM Rules/Calculators/A&P                         65 year old female with ongoing shortness of breath.  Hypoxic on room air via EMS at 89%.  She does not have any home supplemental O2.  She is tachypneic on arrival to the ED, speaking in short sentences.  She has some fine crackles in the low lung bases, with decreased breath sounds.  Labs ordered, reviewed and interpreted by me, CBC without leukocytosis, normal hemoglobin.  CMP without any significant electrode abnormalities, normal renal and liver function tests.  Normal BNP.  Chest x-ray  with cardiomegaly and vascular congestion.  No evidence of infection.  She just had a CTA done on 1/26, do not suspect PE at this time.  Patient is satting at 95% on room air on 3 L of oxygen, however I do have concerns as the patient left [facility] without completing her hospital work-up at [facility].  She was originally admitted to the MICU for respiratory failure, hypercapnia, altered mental status.  With no appropriate pulmonology follow-up, no supplemental O2, I have concerns over the patient's safety returning home.  Will consult the hospitalist team for admission, further work-up.  She may require an echocardiogram.  Spoke with the hospitalist team who will admit the patient for further evaluation and treatment.  This was a shared visit with my supervising physician Dr. Johnney Killian who independently saw and evaluated the patient & provided guidance in evaluation/management/disposition ,in agreement with care   Final Clinical Impression(s) / ED Diagnoses Final diagnoses:  Acute on chronic respiratory failure [facility])    Rx / DC Orders ED Discharge Orders    None       Lyndel Safe 04/30/20 1026    Charlesetta Shanks, MD 04/30/20 1154

## 2020-05-01 DIAGNOSIS — G4730 Sleep apnea, unspecified: Secondary | ICD-10-CM

## 2020-05-01 DIAGNOSIS — J9622 Acute and chronic respiratory failure with hypercapnia: Secondary | ICD-10-CM | POA: Diagnosis not present

## 2020-05-01 DIAGNOSIS — J9621 Acute and chronic respiratory failure with hypoxia: Secondary | ICD-10-CM | POA: Diagnosis not present

## 2020-05-01 DIAGNOSIS — E669 Obesity, unspecified: Secondary | ICD-10-CM

## 2020-05-01 LAB — COMPREHENSIVE METABOLIC PANEL
ALT: 21 U/L (ref 0–44)
ALT: 23 U/L (ref 0–44)
AST: 50 U/L — ABNORMAL HIGH (ref 15–41)
AST: 22 U/L (ref 15–41)
Albumin: 3.4 g/dL — ABNORMAL LOW (ref 3.5–5.0)
Albumin: 3.8 g/dL (ref 3.5–5.0)
Alkaline Phosphatase: 76 U/L (ref 38–126)
Alkaline Phosphatase: 91 U/L (ref 38–126)
Anion gap: 10 (ref 5–15)
Anion gap: 13 (ref 5–15)
BUN: 8 mg/dL (ref 8–23)
BUN: 7 mg/dL — ABNORMAL LOW (ref 8–23)
CO2: 35 mmol/L — ABNORMAL HIGH (ref 22–32)
CO2: 33 mmol/L — ABNORMAL HIGH (ref 22–32)
Calcium: 9 mg/dL (ref 8.9–10.3)
Calcium: 9.3 mg/dL (ref 8.9–10.3)
Chloride: 93 mmol/L — ABNORMAL LOW (ref 98–111)
Chloride: 92 mmol/L — ABNORMAL LOW (ref 98–111)
Creatinine, Ser: 0.51 mg/dL (ref 0.44–1.00)
Creatinine, Ser: 0.55 mg/dL (ref 0.44–1.00)
GFR, Estimated: >60 mL/min (ref >60)
GFR, Estimated: >60 mL/min (ref >60)
Glucose, Bld: 114 mg/dL — ABNORMAL HIGH (ref 70–99)
Glucose, Bld: 117 mg/dL — ABNORMAL HIGH (ref 70–99)
Potassium: 5.8 mmol/L — ABNORMAL HIGH (ref 3.5–5.1)
Potassium: 4.5 mmol/L (ref 3.5–5.1)
Sodium: 138 mmol/L (ref 135–145)
Sodium: 138 mmol/L (ref 135–145)
Total Bilirubin: 1.7 mg/dL — ABNORMAL HIGH (ref 0.3–1.2)
Total Bilirubin: 1 mg/dL (ref 0.3–1.2)
Total Protein: 6.3 g/dL — ABNORMAL LOW (ref 6.5–8.1)
Total Protein: 7.2 g/dL (ref 6.5–8.1)

## 2020-05-01 LAB — BLOOD GAS, ARTERIAL
Acid-Base Excess: 10.8 mmol/L — ABNORMAL HIGH (ref 0.0–2.0)
Bicarbonate: 37.9 mmol/L — ABNORMAL HIGH (ref 20.0–28.0)
Drawn by: 33176
FIO2: 32
O2 Saturation: 93.5 %
Patient temperature: 37
pCO2 arterial: 86.9 mmHg (ref 32.0–48.0)
pH, Arterial: 7.263 — ABNORMAL LOW (ref 7.350–7.450)
pO2, Arterial: 74.9 mmHg — ABNORMAL LOW (ref 83.0–108.0)

## 2020-05-01 LAB — CBC
HCT: 47.6 % — ABNORMAL HIGH (ref 36.0–46.0)
Hemoglobin: 13.7 g/dL (ref 12.0–15.0)
MCH: 30 pg (ref 26.0–34.0)
MCHC: 28.8 g/dL — ABNORMAL LOW (ref 30.0–36.0)
MCV: 104.4 fL — ABNORMAL HIGH (ref 80.0–100.0)
Platelets: 301 10*3/uL (ref 150–400)
RBC: 4.56 MIL/uL (ref 3.87–5.11)
RDW: 15.1 % (ref 11.5–15.5)
WBC: 9 10*3/uL (ref 4.0–10.5)
nRBC: 0 % (ref 0.0–0.2)

## 2020-05-01 LAB — HEMOGLOBIN A1C
Hgb A1c MFr Bld: 6.3 % — ABNORMAL HIGH (ref 4.8–5.6)
Mean Plasma Glucose: 134.11 mg/dL

## 2020-05-01 LAB — GLUCOSE, CAPILLARY
Glucose-Capillary: 108 mg/dL — ABNORMAL HIGH (ref 70–99)
Glucose-Capillary: 93 mg/dL (ref 70–99)
Glucose-Capillary: 124 mg/dL — ABNORMAL HIGH (ref 70–99)
Glucose-Capillary: 109 mg/dL — ABNORMAL HIGH (ref 70–99)
Glucose-Capillary: 95 mg/dL (ref 70–99)

## 2020-05-01 LAB — MRSA PCR SCREENING: MRSA by PCR: NEGATIVE

## 2020-05-01 MED ORDER — FUROSEMIDE 10 MG/ML IJ SOLN
20.0000 mg | Freq: Once | INTRAMUSCULAR | Status: AC
  Administered 2020-05-01: 20 mg via INTRAVENOUS
  Filled 2020-05-01: qty 2

## 2020-05-01 MED ORDER — SODIUM ZIRCONIUM CYCLOSILICATE 10 G PO PACK
10.0000 g | PACK | Freq: Once | ORAL | Status: AC
  Administered 2020-05-01: 10 g via ORAL
  Filled 2020-05-01: qty 1

## 2020-05-01 MED ORDER — LOPERAMIDE HCL 2 MG PO CAPS
2.0000 mg | ORAL_CAPSULE | ORAL | Status: DC | PRN
  Administered 2020-05-01: 2 mg via ORAL
  Filled 2020-05-01: qty 1

## 2020-05-01 MED ORDER — FUROSEMIDE 20 MG PO TABS
20.0000 mg | ORAL_TABLET | Freq: Every day | ORAL | Status: AC
  Administered 2020-05-01 – 2020-05-02 (×2): 20 mg via ORAL
  Filled 2020-05-01 (×2): qty 1

## 2020-05-01 NOTE — Evaluation (Signed)
Occupational Therapy Evaluation Patient Details Name: Tricia Perry MRN: [id number] DOB: 05-26-1955 Today's Date: 05/01/2020    History of Present Illness Pt is 65 year old female with past medical history of DM type II, HTN, history of PE, obesity, OSA not compliant to CPAP, asthma, fibromyalgia, GERD, who presented to ED for shortness of breath and low oxygen level at home. Found to have acute on chronic respiratory failure and hypoxia.   Clinical Impression   This 65 yo female admitted with above presents to acute OT with PLOF of being Mod I with ambulation short distances with rollator and able to so her UB ADLs but needing A for LB ADLs, some A with back peri care, A out of shower to shower seat, and not on O2. Currently pt's O2 sats drop to 83% on RA with activity and take up to 2 minutes to recover once placed on 4liters of O2. Due to drop in sats and increased work of breathing with activity she is now also limited in her basic self care a little more. She will benefit from acute OT with follow up HHOT and 24 hour S/prn A.    Follow Up Recommendations  Home health OT;Supervision/Assistance - 24 hour    Equipment Recommendations  None recommended by OT       Precautions / Restrictions Precautions Precautions: Fall;Other (comment) Precaution Comments: watch O2 Restrictions Weight Bearing Restrictions: No      Mobility Bed Mobility               General bed mobility comments: in chair    Transfers Overall transfer level: Needs assistance Equipment used: 4-wheeled walker Transfers: Sit to/from Stand;Stand Pivot Transfers Sit to Stand: Min guard;+2 safety/equipment Stand pivot transfers: Min guard;+2 safety/equipment       General transfer comment: Performed sit to stand x 3; Transferred from chair to [facility] and back to chair; min guard for safety    Balance Overall balance assessment: Needs assistance Sitting-balance support: No upper extremity supported Sitting  balance-Leahy Scale: Good     Standing balance support: Bilateral upper extremity supported;No upper extremity supported Standing balance-Leahy Scale: Fair Standing balance comment: Utilized rollator for ambulation but was able to stand pivot without AD                           ADL either performed or assessed with clinical judgement   ADL Overall ADL's : Needs assistance/impaired Eating/Feeding: Independent;Sitting   Grooming: Set up;Sitting   Upper Body Bathing: Minimal assistance Upper Body Bathing Details (indicate cue type and reason): Min guard A sit<>stand Lower Body Bathing: Maximal assistance Lower Body Bathing Details (indicate cue type and reason): Min guard A sit<>stand Upper Body Dressing : Set up;Sitting   Lower Body Dressing: Maximal assistance Lower Body Dressing Details (indicate cue type and reason): Min guard A sit<>stand Toilet Transfer: Min guard;Stand-pivot;BSC   Toileting- Clothing Manipulation and Hygiene: Min guard;Sit to/from stand Toileting - Clothing Manipulation Details (indicate cue type and reason): front peri                          Pertinent Vitals/Pain Pain Assessment: No/denies pain     Hand Dominance Right   Extremity/Trunk Assessment Upper Extremity Assessment Upper Extremity Assessment: Defer to OT evaluation   Lower Extremity Assessment Lower Extremity Assessment: Generalized weakness   Cervical / Trunk Assessment Cervical / Trunk Assessment: Normal   Communication Communication Communication: No  difficulties   Cognition Arousal/Alertness: Awake/alert Behavior During Therapy: WFL for tasks assessed/performed Overall Cognitive Status: Within Functional Limits for tasks assessed                                     General Comments  Pt was on 3.5 L O2 rest with sats 95%.  Tried RA rest and sats down to 90%.  Ambulated RA sats down to 83%, turned 2L O2 and sats up to 85%.  Returned to rest  increased to 3 then 4L requiring 2 mins to return to 90%.  Once rested able to decrease back to 3 L with sats95%.  Pt with DOE of 4/4 with activity.            Home Living Family/patient expects to be discharged to:: Private residence Living Arrangements: Spouse/significant other Available Help at Discharge: Family;Available 24 hours/day Type of Home: House Home Access: Ramped entrance     Home Layout: One level         Bathroom Toilet: Handicapped height (with a bidet)     Home Equipment: Walker - 4 wheels;Shower seat          Prior Functioning/Environment Level of Independence: Needs assistance  Gait / Transfers Assistance Needed: Used rollator ; limited household ambulator ADL's / Homemaking Assistance Needed: Pt did upper body, spouse assisted with lower body            OT Problem List: Decreased activity tolerance;Impaired balance (sitting and/or standing);Cardiopulmonary status limiting activity      OT Treatment/Interventions: Self-care/ADL training;Patient/family education;Energy conservation    OT Goals(Current goals can be found in the care plan section) Acute Rehab OT Goals Patient Stated Goal: return home OT Goal Formulation: With patient Time For Goal Achievement: 05/15/20 Potential to Achieve Goals: Good  OT Frequency: Min 2X/week           Co-evaluation PT/OT/SLP Co-Evaluation/Treatment: Yes Reason for Co-Treatment: Complexity of the patient's impairments (multi-system involvement);For patient/therapist safety (morbid obsesity and respiratory compromise) PT goals addressed during session: Mobility/safety with mobility;Proper use of DME OT goals addressed during session: Strengthening/ROM;ADL's and self-care      AM-PAC OT "6 Clicks" Daily Activity     Outcome Measure Help from another person eating meals?: None Help from another person taking care of personal grooming?: A Little Help from another person toileting, which includes using  toliet, bedpan, or urinal?: A Little Help from another person bathing (including washing, rinsing, drying)?: A Lot Help from another person to put on and taking off regular upper body clothing?: A Little Help from another person to put on and taking off regular lower body clothing?: A Lot 6 Click Score: 17   End of Session Equipment Utilized During Treatment: Gait belt;Oxygen (RA-4 liters; rollator) Nurse Communication:  (pt requesting something for loose stools)  Activity Tolerance: Patient limited by fatigue Patient left: in chair;with call bell/phone within reach  OT Visit Diagnosis: Unsteadiness on feet (R26.81);Other abnormalities of gait and mobility (R26.89);Muscle weakness (generalized) (M62.81)                Time: 4627-0350 OT Time Calculation (min): 33 min Charges:  OT General Charges $OT Visit: 1 Visit OT Evaluation $OT Eval Moderate Complexity: [place], OTR/L Acute [employer] Pager [phone] Office [phone]     Almon Register 05/01/2020, 1:20 PM

## 2020-05-01 NOTE — Plan of Care (Signed)
  Problem: Education: Goal: Knowledge of General Education information will improve Description: Including pain rating scale, medication(s)/side effects and non-pharmacologic comfort measures Outcome: Progressing   Problem: Clinical Measurements: Goal: Ability to maintain clinical measurements within normal limits will improve Outcome: Progressing   Problem: Clinical Measurements: Goal: Diagnostic test results will improve Outcome: Progressing   Problem: Clinical Measurements: Goal: Respiratory complications will improve Outcome: Progressing   Problem: Nutrition: Goal: Adequate nutrition will be maintained Outcome: Progressing   Problem: Coping: Goal: Level of anxiety will decrease Outcome: Progressing   Problem: Pain Managment: Goal: General experience of comfort will improve Outcome: Progressing   Problem: Skin Integrity: Goal: Risk for impaired skin integrity will decrease Outcome: Progressing

## 2020-05-01 NOTE — Progress Notes (Signed)
Spoke to MD about critical VVG results.  MD placed orders for pt to be moved to a progressive unit in order to place pt on Bipap.

## 2020-05-01 NOTE — Evaluation (Signed)
Physical Therapy Evaluation Patient Details Name: Tricia Perry MRN: [id number] DOB: [birth year] Today's Date: 05/01/2020   History of Present Illness  Pt is 65 year old female with past medical history of DM type II, HTN, history of PE, obesity, OSA not compliant to CPAP, asthma, fibromyalgia, GERD, who presented to ED for shortness of breath and low oxygen level at home. Found to have acute on chronic respiratory failure and hypoxia.  Clinical Impression  Pt admitted with above diagnosis. Pt from home and is limited household ambulator.  Lives with spouse who can assist as needed.  Today pt was able to transfer and ambulate with rollator with min guard.  She did fatigue easily with increased work of breathing with activity.  Pt did require O2 to maintain sats with ambulation (see general comments and O2 note). Pt currently with functional limitations due to the deficits listed below (see PT Problem List). Pt will benefit from skilled PT to increase their independence and safety with mobility to allow discharge to the venue listed below.       Follow Up Recommendations Home health PT;Supervision for mobility/OOB    Equipment Recommendations  None recommended by PT    Recommendations for Other Services       Precautions / Restrictions Precautions Precautions: Fall;Other (comment) Precaution Comments: watch O2 Restrictions Weight Bearing Restrictions: No      Mobility  Bed Mobility               General bed mobility comments: in chair    Transfers Overall transfer level: Needs assistance Equipment used: 4-wheeled walker Transfers: Sit to/from Stand;Stand Pivot Transfers Sit to Stand: Min guard;+2 safety/equipment Stand pivot transfers: Min guard;+2 safety/equipment       General transfer comment: Performed sit to stand x 3; Transferred from chair to [facility] and back to chair; min guard for safety  Ambulation/Gait Ambulation/Gait assistance: Min guard Gait Distance (Feet):  55 Feet Assistive device: 4-wheeled walker Gait Pattern/deviations: Step-to pattern;Wide base of support Gait velocity: decreased   General Gait Details: Ambulated in hall with rollator but fatigued easily with increased work of breathing.  See general comments below.  Stairs            Wheelchair Mobility    Modified Rankin (Stroke Patients Only)       Balance Overall balance assessment: Needs assistance Sitting-balance support: No upper extremity supported Sitting balance-Leahy Scale: Good     Standing balance support: Bilateral upper extremity supported;No upper extremity supported Standing balance-Leahy Scale: Fair Standing balance comment: Utilized rollator for ambulation but was able to stand pivot without AD                             Pertinent Vitals/Pain Pain Assessment: No/denies pain    Home Living Family/patient expects to be discharged to:: Private residence Living Arrangements: Spouse/significant other Available Help at Discharge: Family;Available 24 hours/day Type of Home: House Home Access: Ramped entrance     Home Layout: One level Home Equipment: Walker - 4 wheels;Shower seat      Prior Function Level of Independence: Needs assistance   Gait / Transfers Assistance Needed: Used rollator ; limited household ambulator  ADL's / Homemaking Assistance Needed: Pt did upper body, spouse assisted with lower body        Hand Dominance        Extremity/Trunk Assessment   Upper Extremity Assessment Upper Extremity Assessment: Defer to OT evaluation    Lower  Extremity Assessment Lower Extremity Assessment: Generalized weakness    Cervical / Trunk Assessment Cervical / Trunk Assessment: Normal  Communication   Communication: No difficulties  Cognition Arousal/Alertness: Awake/alert Behavior During Therapy: WFL for tasks assessed/performed Overall Cognitive Status: Within Functional Limits for tasks assessed                                         General Comments General comments (skin integrity, edema, etc.): Pt was on 3.5 L O2 rest with sats 95%.  Tried RA rest and sats down to 90%.  Ambulated RA sats down to 83%, turned 2L O2 and sats up to 85%.  Returned to rest increased to 3 then 4L requiring 2 mins to return to 90%.  Once rested able to decrease back to 3 L with sats95%.  Pt with DOE of 4/4 with activity.    Exercises     Assessment/Plan    PT Assessment Patient needs continued PT services  PT Problem List Decreased strength;Decreased mobility;Decreased activity tolerance;Cardiopulmonary status limiting activity;Decreased balance;Decreased knowledge of use of DME       PT Treatment Interventions DME instruction;Therapeutic activities;Gait training;Therapeutic exercise;Patient/family education;Balance training;Functional mobility training    PT Goals (Current goals can be found in the Care Plan section)  Acute Rehab PT Goals Patient Stated Goal: return home PT Goal Formulation: With patient Time For Goal Achievement: 05/15/20 Potential to Achieve Goals: Good    Frequency Min 3X/week   Barriers to discharge        Co-evaluation PT/OT/SLP Co-Evaluation/Treatment: Yes Reason for Co-Treatment: Complexity of the patient's impairments (multi-system involvement);For patient/therapist safety (morbid obsesity and respiratory compromise) PT goals addressed during session: Mobility/safety with mobility;Proper use of DME OT goals addressed during session: Strengthening/ROM;ADL's and self-care       AM-PAC PT "6 Clicks" Mobility  Outcome Measure Help needed turning from your back to your side while in a flat bed without using bedrails?: A Little Help needed moving from lying on your back to sitting on the side of a flat bed without using bedrails?: A Little Help needed moving to and from a bed to a chair (including a wheelchair)?: A Little Help needed standing up from a chair using  your arms (e.g., wheelchair or bedside chair)?: A Little Help needed to walk in hospital room?: A Little Help needed climbing 3-5 steps with a railing? : A Lot 6 Click Score: 17    End of Session Equipment Utilized During Treatment: Gait belt;Oxygen Activity Tolerance: Patient tolerated treatment well Patient left: in chair;with call bell/phone within reach Nurse Communication: Mobility status PT Visit Diagnosis: Other abnormalities of gait and mobility (R26.89)    Time: 8546-2703 PT Time Calculation (min) (ACUTE ONLY): 30 min   Charges:   PT Evaluation $PT Eval Moderate Complexity: 1 Melina Schools, PT Acute Rehab Services Pager [phone] [facility] [phone]    Karlton Lemon 05/01/2020, 1:15 PM

## 2020-05-01 NOTE — Progress Notes (Signed)
Subjective:   Hospital day: 1  Overnight events: No OV  Patient is seen at bedside.  She appears in no acute respiratory distress.  Tolerated Bipap overnight. Feels breathing better this morning. Has breathing machine at home but has difficulty tolerating and not using it much. Patient is not on oxygen at home. Encourage her to use mask at home for her OSA, Discussed plan for potential discharge home tomorrow if O2 levels continue to improve.   Objective:   Vital signs in last 24 hours: Vitals:   04/30/20 2339 05/01/20 0040 05/01/20 0217 05/01/20 0318  BP: 135/67 138/66  135/82  Pulse: 93 100 99 82  Resp: 20 20 (!) 39 19  Temp: 98.4 F (36.9 C) 98.1 F (36.7 C)  98 F (36.7 C)  TempSrc: Oral Oral  Axillary  SpO2: 100% 98% 98% 98%  Weight:      Height:       CBC Latest Ref Rng & Units 05/01/2020 04/30/2020 04/30/2020  WBC 4.0 - 10.5 K/uL 9.0 - 6.8  Hemoglobin 12.0 - 15.0 g/dL 13.7 16.0(H) 13.6  Hematocrit 36.0 - 46.0 % 47.6(H) 47.0(H) 44.8  Platelets 150 - 400 K/uL 301 - 283   CMP Latest Ref Rng & Units 05/01/2020 04/30/2020 04/30/2020  Glucose 70 - 99 mg/dL 114(H) - 127(H)  BUN 8 - 23 mg/dL 8 - 7(L)  Creatinine 0.44 - 1.00 mg/dL 0.51 - 0.52  Sodium 135 - 145 mmol/L 138 137 138  Potassium 3.5 - 5.1 mmol/L 5.8(H) 4.5 4.2  Chloride 98 - 111 mmol/L 93(L) - 97(L)  CO2 22 - 32 mmol/L 35(H) - 31  Calcium 8.9 - 10.3 mg/dL 9.0 - 8.9  Total Protein 6.5 - 8.1 g/dL 6.3(L) - 6.7  Total Bilirubin 0.3 - 1.2 mg/dL 1.7(H) - 0.5  Alkaline Phos 38 - 126 U/L 76 - 83  AST 15 - 41 U/L 50(H) - 20  ALT 0 - 44 U/L 21 - 24     Physical Exam  Physical Exam Constitutional:      Appearance: She is obese.  HENT:     Head: Normocephalic.  Eyes:     General:        Right eye: No discharge.        Left eye: No discharge.  Cardiovascular:     Rate and Rhythm: Normal rate and regular rhythm.     Comments: Positive JVD Pulmonary:     Effort: No respiratory distress.  Abdominal:     General:  Bowel sounds are normal.  Musculoskeletal:     Cervical back: Normal range of motion.     Right lower leg: Edema (Trace) present.     Left lower leg: Edema (Trace) present.  Skin:    General: Skin is warm.  Neurological:     Mental Status: She is alert.  Psychiatric:        Mood and Affect: Mood normal.     Assessment/Plan: TIFFANNI SCARFO is a 66 y.o. female with hx of asthma, type 2 diabetes, fibromyalgia, PE 2015 presenting with acute respiratory failure with hypoxia and hypercarbia secondary to community-acquired pneumonia with underlying OSA and OHS.  Active Problems:   OSA (obstructive sleep apnea)   Acute on chronic respiratory failure (HCC)   Morbid obesity (HCC)   Acute on chronic respiratory failure with hypoxia and hypercapnia (HCC)  Acute hypoxic respiratory failure with hypoxia and hypercarbia Community-acquired pneumonia Obesity hypoventilation syndrome OSA Patient had a new oxygen requirement lately due to  community-acquired pneumonia with underlying OHS and OSA. CTA at [facility] negative for PE. ABG shows respiratory acidosis with hypercarbia, likely chronic. Currently satting well on 2 L. We will treat her pneumonia with 7 days of antibiotics per pulmonology recommendation. Continue BiPAP at night. If patient continued to improve require less oxygen in the next few days, she should be able to be discharged. Patient appears mildly volume overloaded on exam with JVP and trace LE edema. Echocardiogram showed normal EF and diastolic function. Given chest x-ray shows pulmonary vascular congestion, will give her 1 dose of IV Lasix 20 mg to optimize her volume status. -Ceftriaxone (2/7) -1 dose of IV Lasix 20 mg -BiPAP at night -Emphasized to patient the importance of using her CPAP at night -PT/OT   Asthma -Albuterol as needed -Continue montelukast 10 mg daily   Hypertension -Amlodipine 5 mg daily   Type 2 diabetes CBG at goal. -Continue sliding scale  insulin  Diet: Heart healthy carb modified IVF: N/A VTE: Lovenox CODE: Full  Prior to Admission Living Arrangement: Home Anticipated Discharge Location: To be determined Barriers to Discharge: Oxygen requirement   Gaylan Gerold, DO 05/01/2020, 5:20 AM Pager: [phone] After 5pm on weekdays and 1pm on weekends: On Call pager [phone]

## 2020-05-01 NOTE — Progress Notes (Signed)
PT Note  Patient Details Name: Tricia Perry MRN: [id number] DOB: 11/23/1955  Pt seen for PT evaluation - formal eval note to follow.    SATURATION QUALIFICATIONS: (This note is used to comply with regulatory documentation for home oxygen)  Patient Saturations on Room Air at Rest = 90%  Patient Saturations on Room Air while Ambulating = 83%  Patient Saturations on 2 Liters of oxygen while Ambulating = 85%  Please briefly explain why patient needs home oxygen: In order to maintain O2 levels with activity.   [name] 05/01/2020, 12:22 PM

## 2020-05-01 NOTE — Progress Notes (Signed)
NAME:  Tricia Perry, MRN:  [id number], DOB:  [birth year], LOS: 1 ADMISSION DATE:  04/30/2020, CONSULTATION DATE:  1/31 REFERRING MD:  Masoudi, CHIEF COMPLAINT:  Dyspnea   Brief History:  65 y/o female with morbid obesity, untreated OSA and likely OHS presented with dyspnea and hypoxemia in the setting of a new infiltrate , cough, and dyspnea.    Past Medical History:  Former smoker, Asthma, DMT2, morbid obesity (BMI 62.31kg/m2), GERD, OSA- untreated, HTN, hypothyroidism, DDD, fibromyalgia, chronic venous insufficiency, arthritis, PE 2015 COVID vaccinated w/booster  Significant Hospital Events:  1/31 admitted to IMTS  Consults:  Pulmonology  Procedures:    Significant Diagnostic Tests:  1/26 CTA PE > image quality degraded by motion, neg for PE, lungs are clear without infiltrate or effusion, right renal atrophy 2/1 Echo> Normal LVEF, RV not well visualized  Micro Data:  1/31 SARS COV 2 > negative  Antimicrobials:  ceftx 1/31 >    Interim History / Subjective:  Feels OK Breathing about the same  Objective   Blood pressure (!) 155/108, pulse 85, temperature 98.1 F (36.7 C), temperature source Oral, resp. rate (!) 22, height 5' 5.5" (1.664 m), weight (!) 173.3 kg, SpO2 93 %.        Intake/Output Summary (Last 24 hours) at 05/01/2020 1313 Last data filed at 05/01/2020 0800 Gross per 24 hour  Intake 580 ml  Output 1100 ml  Net -520 ml   Filed Weights   04/30/20 1700 05/01/20 0534  Weight: (!) 170.2 kg (!) 173.3 kg    Examination:  General:  Morbidly obese, sitting up in a chair speaking in full sentences, no complaints HENT: NCAT OP clear PULM: CTA B, normal effort CV: RRR, no mgr GI: BS+, soft, nontender MSK: normal bulk and tone Derm: edema legs bilaterally Neuro: awake, alert, no distress, MAEW   Resolved Hospital Problem list     Assessment & Plan:  Acute on chronic respiratory failure with hypercarbia: due to possible pneumonia, acute diastolic chf,  and obesity hypoventilation syndrome; in no distress, no sign of impending respiratory arrest > continue BIPAP at night > add diuresis again, will give another dose of lasix today and tomorrow > wean off O2 to maintain O2 saturation > 88% > needs home O2 evaluation > Patient continues to exhibit signs of hypercapnea associated with chronic respiratory failure secondary to severe restrictive thoracic disorder due to obesity hypoventilation syndrome.   Patient requires the use of NIV both qHS and daytime to help with exacerbation periods.  The use of the NIV with treat the patients high pCO2 levels and can reduce risk of exacerbations and future hospitalizations whne used at night and during the day. Patient with need these advanced settings in conjunction with current medication regimen.  BIPAP is not an option due to its functional limitations and severity of the patient's condition.  Failure to have NIV available for use over a 24 hour period could lead to death.  Patient is able to protect their airways and clear secretions on their own. > have placed consult for home ventilator through TOC; Trilogy vent is a good example of what would help her.  If not able to get this then she needs to bring in her home CPAP machine and we can write a prescription for what she needs to get it working again.   Abnormal chest imaging: I don't think she has pneumonia (no fever, WBC or productive cough) Stop antibiotics Needs outpatient CXR in 2-3 weeks to  ensure improvement in infiltrates  Acute diastolic heart failure> based on physical exam, BNP falsely low Lasix as above Daily weights  Will follow  Best practice (evaluated daily)   Per teaching service  Goals of Care:   Per teaching service  Labs   CBC: Recent Labs  Lab 04/30/20 0652 04/30/20 1302 05/01/20 0057  WBC 6.8  --  9.0  NEUTROABS 4.5  --   --   HGB 13.6 16.0* 13.7  HCT 44.8 47.0* 47.6*  MCV 101.6*  --  104.4*  PLT 283  --  301     Basic Metabolic Panel: Recent Labs  Lab 04/30/20 0652 04/30/20 1302 05/01/20 0057  NA 138 137 138  K 4.2 4.5 5.8*  CL 97*  --  93*  CO2 31  --  35*  GLUCOSE 127*  --  114*  BUN 7*  --  8  CREATININE 0.52  --  0.51  CALCIUM 8.9  --  9.0   GFR: Estimated Creatinine Clearance: 115.3 mL/min (by C-G formula based on SCr of 0.51 mg/dL). Recent Labs  Lab 04/30/20 0652 05/01/20 0057  WBC 6.8 9.0    Liver Function Tests: Recent Labs  Lab 04/30/20 0652 05/01/20 0057  AST 20 50*  ALT 24 21  ALKPHOS 83 76  BILITOT 0.5 1.7*  PROT 6.7 6.3*  ALBUMIN 3.5 3.4*   No results for input(s): LIPASE, AMYLASE in the last 168 hours. No results for input(s): AMMONIA in the last 168 hours.  ABG    Component Value Date/Time   PHART 7.263 (L) 05/01/2020 0925   PCO2ART 86.9 (HH) 05/01/2020 0925   PO2ART 74.9 (L) 05/01/2020 0925   HCO3 37.9 (H) 05/01/2020 0925   TCO2 40 (H) 04/30/2020 1302   O2SAT 93.5 05/01/2020 0925     Coagulation Profile: No results for input(s): INR, PROTIME in the last 168 hours.  Cardiac Enzymes: No results for input(s): CKTOTAL, CKMB, CKMBINDEX, TROPONINI in the last 168 hours.  HbA1C: Hgb A1c MFr Bld  Date/Time Value Ref Range Status  05/01/2020 12:57 AM 6.3 (H) 4.8 - 5.6 % Final    Comment:    (NOTE) Pre diabetes:          5.7%-6.4%  Diabetes:              >6.4%  Glycemic control for   <7.0% adults with diabetes   11/06/2015 02:52 PM 5.9 (H) 4.8 - 5.6 % Final    Comment:    (NOTE)         Pre-diabetes: 5.7 - 6.4         Diabetes: >6.4         Glycemic control for adults with diabetes: <7.0     CBG: Recent Labs  Lab 04/30/20 1657 04/30/20 2108 05/01/20 0635 05/01/20 1126  GLUCAP 120* 131* 108* 93    Critical care time: n/a      Roselie Awkward, MD [place] [facility] Pager: [phone] Cell: [phone] If no response, call [phone]

## 2020-05-02 DIAGNOSIS — J45909 Unspecified asthma, uncomplicated: Secondary | ICD-10-CM

## 2020-05-02 DIAGNOSIS — I10 Essential (primary) hypertension: Secondary | ICD-10-CM

## 2020-05-02 DIAGNOSIS — J9622 Acute and chronic respiratory failure with hypercapnia: Secondary | ICD-10-CM | POA: Diagnosis not present

## 2020-05-02 DIAGNOSIS — J9621 Acute and chronic respiratory failure with hypoxia: Secondary | ICD-10-CM | POA: Diagnosis not present

## 2020-05-02 DIAGNOSIS — E119 Type 2 diabetes mellitus without complications: Secondary | ICD-10-CM

## 2020-05-02 LAB — GLUCOSE, CAPILLARY
Glucose-Capillary: 116 mg/dL — ABNORMAL HIGH (ref 70–99)
Glucose-Capillary: 103 mg/dL — ABNORMAL HIGH (ref 70–99)
Glucose-Capillary: 127 mg/dL — ABNORMAL HIGH (ref 70–99)
Glucose-Capillary: 167 mg/dL — ABNORMAL HIGH (ref 70–99)

## 2020-05-02 LAB — POCT I-STAT 7, (LYTES, BLD GAS, ICA,H+H)
Acid-Base Excess: 15 mmol/L — ABNORMAL HIGH (ref 0.0–2.0)
Bicarbonate: 43.4 mmol/L — ABNORMAL HIGH (ref 20.0–28.0)
Calcium, Ion: 1.18 mmol/L (ref 1.15–1.40)
HCT: 43 % (ref 36.0–46.0)
Hemoglobin: 14.6 g/dL (ref 12.0–15.0)
O2 Saturation: 86 %
Patient temperature: 98.3
Potassium: 4 mmol/L (ref 3.5–5.1)
Sodium: 134 mmol/L — ABNORMAL LOW (ref 135–145)
TCO2: 45 mmol/L — ABNORMAL HIGH (ref 22–32)
pCO2 arterial: 67.5 mmHg (ref 32.0–48.0)
pH, Arterial: 7.416 (ref 7.350–7.450)
pO2, Arterial: 53 mmHg — ABNORMAL LOW (ref 83.0–108.0)

## 2020-05-02 LAB — CBC
HCT: 46.1 % — ABNORMAL HIGH (ref 36.0–46.0)
Hemoglobin: 13.7 g/dL (ref 12.0–15.0)
MCH: 30.6 pg (ref 26.0–34.0)
MCHC: 29.7 g/dL — ABNORMAL LOW (ref 30.0–36.0)
MCV: 103.1 fL — ABNORMAL HIGH (ref 80.0–100.0)
Platelets: 272 10*3/uL (ref 150–400)
RBC: 4.47 MIL/uL (ref 3.87–5.11)
RDW: 15.1 % (ref 11.5–15.5)
WBC: 9.1 10*3/uL (ref 4.0–10.5)
nRBC: 0 % (ref 0.0–0.2)

## 2020-05-02 LAB — BASIC METABOLIC PANEL
Anion gap: 9 (ref 5–15)
BUN: 7 mg/dL — ABNORMAL LOW (ref 8–23)
CO2: 35 mmol/L — ABNORMAL HIGH (ref 22–32)
Calcium: 8.9 mg/dL (ref 8.9–10.3)
Chloride: 95 mmol/L — ABNORMAL LOW (ref 98–111)
Creatinine, Ser: 0.55 mg/dL (ref 0.44–1.00)
GFR, Estimated: >60 mL/min (ref >60)
Glucose, Bld: 124 mg/dL — ABNORMAL HIGH (ref 70–99)
Potassium: 4.3 mmol/L (ref 3.5–5.1)
Sodium: 139 mmol/L (ref 135–145)

## 2020-05-02 LAB — LEGIONELLA PNEUMOPHILA SEROGP 1 UR AG: L. pneumophila Serogp 1 Ur Ag: NEGATIVE

## 2020-05-02 MED ORDER — FUROSEMIDE 10 MG/ML IJ SOLN
20.0000 mg | Freq: Every day | INTRAMUSCULAR | Status: DC
  Administered 2020-05-02 – 2020-05-03 (×2): 20 mg via INTRAVENOUS
  Filled 2020-05-02 (×2): qty 2

## 2020-05-02 NOTE — Progress Notes (Addendum)
Physical Therapy Treatment Patient Details Name: Tricia Perry MRN: [id number] DOB: 08/28/1955 Today's Date: 05/02/2020    History of Present Illness Pt is 65 year old female with past medical history of DM type II, HTN, history of PE, obesity, OSA not compliant to CPAP, asthma, fibromyalgia, GERD, who presented to ED for shortness of breath and low oxygen level at home. Found to have acute on chronic respiratory failure and hypoxia.    PT Comments    Patient progressing with distance with ambulation.  Reported fatigued already from moving around to change beds, toilet, etc.  She will have daughter to assist, but spouse just had foot surgery and is on a knee walker.  She has needed DME, but will benefit from HHPT to progress mobility/activity tolerance and safety.  Currently dependent on O2 due to SpO2 in 70's at rest on RA and when mobilizing on 1L stays in mid to low 80's, needed 2L at times.  PT will follow up if not d/c.    Follow Up Recommendations  Home health PT;Supervision for mobility/OOB     Equipment Recommendations  Other (comment) (home O2)    Recommendations for Other Services       Precautions / Restrictions Precautions Precautions: Fall Precaution Comments: watch O2    Mobility  Bed Mobility   Bed Mobility: Sit to Supine       Sit to supine: Mod assist   General bed mobility comments: needed help getting legs over EOB (R leg heavier with lymphedema)  Transfers Overall transfer level: Needs assistance Equipment used: Rolling walker (2 wheeled) Transfers: Sit to/from Stand Sit to Stand: Min guard         General transfer comment: able to stand without RW and without much help, but reported needs walker for ambulation; stand to sit safely with some assist for line management  Ambulation/Gait Ambulation/Gait assistance: Min guard Gait Distance (Feet): 60 Feet Assistive device: Rolling walker (2 wheeled) Gait Pattern/deviations: Decreased stride  length;Trunk flexed     General Gait Details: leaning down on L elbow on RW with ambulation; reports limited distances at home as well   Stairs             Wheelchair Mobility    Modified Rankin (Stroke Patients Only)       Balance Overall balance assessment: Needs assistance   Sitting balance-Leahy Scale: Good     Standing balance support: No upper extremity supported Standing balance-Leahy Scale: Fair Standing balance comment: stood without RW first                            Cognition Arousal/Alertness: Awake/alert Behavior During Therapy: WFL for tasks assessed/performed Overall Cognitive Status: Within Functional Limits for tasks assessed                                        Exercises      General Comments General comments (skin integrity, edema, etc.): on 1L O2 at rest, sats 88%, removed and on RA at rest dropped to 77%; on 2-1-2L with mobility/ambulation to keep sats at 88% or higher; in supine on 1-1.5 L still at 86% so increased to 2L for bringing to 90%      Pertinent Vitals/Pain Pain Assessment: No/denies pain    Home Living  Prior Function            PT Goals (current goals can now be found in the care plan section) Progress towards PT goals: Progressing toward goals    Frequency    Min 3X/week      PT Plan Current plan remains appropriate    Co-evaluation              AM-PAC PT "6 Clicks" Mobility   Outcome Measure  Help needed turning from your back to your side while in a flat bed without using bedrails?: A Little Help needed moving from lying on your back to sitting on the side of a flat bed without using bedrails?: A Little Help needed moving to and from a bed to a chair (including a wheelchair)?: A Little Help needed standing up from a chair using your arms (e.g., wheelchair or bedside chair)?: A Little Help needed to walk in hospital room?: A Little Help  needed climbing 3-5 steps with a railing? : A Lot 6 Click Score: 17    End of Session Equipment Utilized During Treatment: Oxygen Activity Tolerance: Patient tolerated treatment well Patient left: in bed;with call bell/phone within reach   PT Visit Diagnosis: Other abnormalities of gait and mobility (R26.89)     Time: 3570-1779 PT Time Calculation (min) (ACUTE ONLY): 30 min  Charges:  $Gait Training: 8-22 mins $Therapeutic Activity: 8-22 mins                     Magda Kiel, PT Acute Rehabilitation Services [phone] Office:[phone] 05/02/2020    Tricia Perry 05/02/2020, 1:02 PM

## 2020-05-02 NOTE — TOC Progression Note (Signed)
Transition of Care [facility]) - Progression Note    Patient Details  Name: Tricia Perry MRN: [id number] Date of Birth: 1956-03-16  Transition of Care [facility]) CM/SW Contact  Zenon Mayo, RN Phone Number: 05/02/2020, 1:34 PM  Clinical Narrative:    Patient will need a NIV, she states she is ok with [employer], NCM made referral to [facility].  He will look over ABG's to make sure she qulifies.         Expected Discharge Plan and Services                                                 Social Determinants of Health (SDOH) Interventions    Readmission Risk Interventions No flowsheet data found.

## 2020-05-02 NOTE — Progress Notes (Addendum)
Subjective:   Hospital day: 2  Overnight event: No OV  Patient is sitting on reclining chair during examination.  She appears comfortable in no acute respiratory distress.  She states that her breathing has improved today.  She understands that she will need a NIV device at home to use at night and daytime.  Patient also expressed her determination to change her lifestyle which including ambulating more.  Objective:  Vital signs in last 24 hours: Vitals:   05/01/20 2307 05/01/20 2328 05/02/20 0436 05/02/20 0448  BP: 132/60  127/71   Pulse:  (!) 106 95   Resp: (!) 23 (!) 21 20   Temp: 98.1 F (36.7 C)  98 F (36.7 C)   TempSrc: Oral  Oral   SpO2:  98% 95%   Weight:    (!) 171.9 kg  Height:        Physical Exam  Physical Exam Constitutional:      General: She is not in acute distress.    Appearance: She is obese.  HENT:     Head: Normocephalic.  Eyes:     General:        Right eye: No discharge.        Left eye: No discharge.  Cardiovascular:     Rate and Rhythm: Normal rate and regular rhythm.  Pulmonary:     Effort: Pulmonary effort is normal. No respiratory distress.     Comments: Slight decreased breath sound on the right side Musculoskeletal:     Cervical back: Normal range of motion.     Right lower leg: Edema (Trace) present.     Left lower leg: Edema (Trace) present.  Skin:    General: Skin is warm.  Neurological:     Mental Status: She is alert.  Psychiatric:        Mood and Affect: Mood normal.     Assessment/Plan: Tricia Perry is a 65 y.o. female with hx of asthma, type 2 diabetes, fibromyalgia, PE 2015 presenting with acute respiratory failure with hypoxia and hypercarbia secondary to OSA and OHS with a component of volume overload.  Active Problems:   OSA (obstructive sleep apnea)   Acute on chronic respiratory failure (HCC)   Morbid obesity (HCC)   Acute on chronic respiratory failure with hypoxia and hypercapnia (HCC)   Acute hypoxic  respiratory failure with hypoxia and hypercarbia Obesity hypoventilation syndrome OSA ABG this morning showed improving of pH and CO2 however O2 still low.  Patient will require NIV at home to use at nighttime and daytime.  Case management is working on getting her trilogy and home oxygen arranged. Patient was diuresed with Lasix yesterday and had a 1100 cc of urine output.  Her weights dropped 3 pounds since yesterday.  We will continue diuresing for the next 2-3 days to optimize her fluid status. Antibiotics was discontinued given no signs and symptoms of pneumonia -Appreciate pulmonology recommendations -continue IV Lasix 20 mg -I/O -Daily weights -BiPAP at night -PT/OT recommended home health PT   Asthma -Albuterol as needed -Continue montelukast 10 mg daily   Hypertension -Amlodipine 5 mg daily -Losartan 100 mg daily   Type 2 diabetes CBG at goal. -Continue sliding scale insulin  Diet: Heart healthy carb modified IVF: N/A VTE: Lovenox CODE: Full  Prior to Admission Living Arrangement: Home Anticipated Discharge Location:  Home with home health PT Barriers to Discharge: Oxygen requirement   Gaylan Gerold, DO 05/02/2020, 6:31 AM Pager: [phone] After 5pm on weekdays and  1pm on weekends: On Call pager [phone]

## 2020-05-02 NOTE — Progress Notes (Signed)
NAME:  Tricia Perry, MRN:  [id number], DOB:  01-May-1955, LOS: 2 ADMISSION DATE:  04/30/2020, CONSULTATION DATE:  1/31 REFERRING MD:  Masoudi, CHIEF COMPLAINT:  Dyspnea   Brief History:  65 y/o female with morbid obesity, untreated OSA and likely OHS presented with dyspnea and hypoxemia in the setting of a new infiltrate , cough, and dyspnea.    Past Medical History:  Former smoker, Asthma, DMT2, morbid obesity (BMI 62.31kg/m2), GERD, OSA- untreated, HTN, hypothyroidism, DDD, fibromyalgia, chronic venous insufficiency, arthritis, PE 2015 COVID vaccinated w/booster  Significant Hospital Events:  1/31 admitted to IMTS  Consults:  Pulmonology  Procedures:    Significant Diagnostic Tests:  1/26 CTA PE > image quality degraded by motion, neg for PE, lungs are clear without infiltrate or effusion, right renal atrophy 2/1 Echo> Normal LVEF, RV not well visualized  Micro Data:  1/31 SARS COV 2 > negative  Antimicrobials:  ceftx 1/31 >  2/1  Interim History / Subjective:  No significant change, maybe breathing a little better. She doesn't feel like voiding was excessive after diuresis. Wants to go home. Still on 4L [place].   Objective   Blood pressure 127/71, pulse 93, temperature 98.3 F (36.8 C), temperature source Oral, resp. rate (!) 28, height 5' 5.5" (1.664 m), weight (!) 171.9 kg, SpO2 95 %.        Intake/Output Summary (Last 24 hours) at 05/02/2020 0946 Last data filed at 05/01/2020 2125 Gross per 24 hour  Intake 240 ml  Output 1100 ml  Net -860 ml   Filed Weights   04/30/20 1700 05/01/20 0534 05/02/20 0448  Weight: (!) 170.2 kg (!) 173.3 kg (!) 171.9 kg    Examination: General:  Morbidly obese female seated in bedside chair. No acute distress.  HENT: [place]/AT, no appreciable JVD PULM: Clear, unlabored CV: RRR, no MRG. Bilateral lower extremity edema.  GI: Soft, normoactive, nontender MSK: No acute deformity.  Skin: grossly intact.  Neuro: awake, oriented, non-focal  .   Resolved Hospital Problem list     Assessment & Plan:   Acute on chronic respiratory failure with hypercarbia: almost certainly multifactorial. Chief driver of her symptoms is likely decompensated OSA/OHS. She has been using her husbands CPAP and rarely. Exam consistent with acute on chronic HFpEF as well. Pneumonia less likely.  - Continue nocturnal and PRN BiPAP.  - Additional dose of lasix today.  - Daily weights - Supplemental O2 weaned down to 2L with sats in the low 90s. Tolerate sat > 88%. Hopefully can come off O2 at least at rest. Will need ambulatory saturation assessment prior to discharge.   - She will require nocturnal NIV upon discharge as she continues to exhibit signs of hypercapnea associated with chronic respiratory failure secondary to severe restrictive thoracic disorder due to obesity hypoventilation syndrome.   Patient requires the use of NIV both qHS and daytime to help with exacerbation periods.  The use of the NIV with treat the patients high pCO2 levels and can reduce risk of exacerbations and future hospitalizations whne used at night and during the day. Patient with need these advanced settings in conjunction with current medication regimen.  BIPAP is not an option due to its functional limitations and severity of the patient's condition.  Failure to have NIV available for use over a 24 hour period could lead to death.  Patient is able to protect their airways and clear secretions on their own.  > TOC has been consulted for home vent such as  Trilogy. Awaiting their assessment.   Abnormal chest imaging: Doubt she has pneumonia (no fever, WBC or productive cough) - Outpatient CXR 2- 3 weeks.   She has expressed interest in regaining energy and mobility. She understands adherence to NIV and weight loss are integral components of meeting that goal. She has requested dietician assistance in planning a healthy diet and calorie goals. I will place a consult. She is also  interested in exercise and I see home health PT has been recommended.    Best practice (evaluated daily)   Per teaching service  Goals of Care:   Per teaching service  Labs   CBC: Recent Labs  Lab 04/30/20 0652 04/30/20 1302 05/01/20 0057 05/02/20 0036 05/02/20 0849  WBC 6.8  --  9.0 9.1  --   NEUTROABS 4.5  --   --   --   --   HGB 13.6 16.0* 13.7 13.7 14.6  HCT 44.8 47.0* 47.6* 46.1* 43.0  MCV 101.6*  --  104.4* 103.1*  --   PLT 283  --  301 272  --     Basic Metabolic Panel: Recent Labs  Lab 04/30/20 0652 04/30/20 1302 05/01/20 0057 05/01/20 1342 05/02/20 0036 05/02/20 0849  NA 138 137 138 138 139 134*  K 4.2 4.5 5.8* 4.5 4.3 4.0  CL 97*  --  93* 92* 95*  --   CO2 31  --  35* 33* 35*  --   GLUCOSE 127*  --  114* 117* 124*  --   BUN 7*  --  8 7* 7*  --   CREATININE 0.52  --  0.51 0.55 0.55  --   CALCIUM 8.9  --  9.0 9.3 8.9  --    GFR: Estimated Creatinine Clearance: 114.8 mL/min (by C-G formula based on SCr of 0.55 mg/dL). Recent Labs  Lab 04/30/20 0652 05/01/20 0057 05/02/20 0036  WBC 6.8 9.0 9.1    Liver Function Tests: Recent Labs  Lab 04/30/20 0652 05/01/20 0057 05/01/20 1342  AST 20 50* 22  ALT 24 21 23   ALKPHOS 83 76 91  BILITOT 0.5 1.7* 1.0  PROT 6.7 6.3* 7.2  ALBUMIN 3.5 3.4* 3.8   No results for input(s): LIPASE, AMYLASE in the last 168 hours. No results for input(s): AMMONIA in the last 168 hours.  ABG    Component Value Date/Time   PHART 7.416 05/02/2020 0849   PCO2ART 67.5 (HH) 05/02/2020 0849   PO2ART 53 (L) 05/02/2020 0849   HCO3 43.4 (H) 05/02/2020 0849   TCO2 45 (H) 05/02/2020 0849   O2SAT 86.0 05/02/2020 0849     Coagulation Profile: No results for input(s): INR, PROTIME in the last 168 hours.  Cardiac Enzymes: No results for input(s): CKTOTAL, CKMB, CKMBINDEX, TROPONINI in the last 168 hours.  HbA1C: Hgb A1c MFr Bld  Date/Time Value Ref Range Status  05/01/2020 12:57 AM 6.3 (H) 4.8 - 5.6 % Final     Comment:    (NOTE) Pre diabetes:          5.7%-6.4%  Diabetes:              >6.4%  Glycemic control for   <7.0% adults with diabetes   11/06/2015 02:52 PM 5.9 (H) 4.8 - 5.6 % Final    Comment:    (NOTE)         Pre-diabetes: 5.7 - 6.4         Diabetes: >6.4         Glycemic  control for adults with diabetes: <7.0     CBG: Recent Labs  Lab 05/01/20 1126 05/01/20 1446 05/01/20 1641 05/01/20 2121 05/02/20 0649  GLUCAP 93 124* 109* 95 116*    Critical care time: n/a       Georgann Housekeeper, AGACNP-BC [facility]  See Amion for personal pager PCCM on call pager [phone] until 7pm. Please call Elink 7p-7a. [phone]  05/02/2020 9:59 AM

## 2020-05-02 NOTE — Progress Notes (Signed)
SATURATION QUALIFICATIONS: (This note is used to comply with regulatory documentation for home oxygen)  Patient Saturations on Room Air at Rest = 77%  Patient Saturations on Room Air while Ambulating = NT%  Patient Saturations on 2 Liters of oxygen while Ambulating = 89%  Please briefly explain why patient needs home oxygen:  Patient with significant hypoxia at rest on RA and with mobility on 1L O2.    Magda Kiel, PT Acute Rehabilitation Services Pager:[phone] [phone] 05/02/2020

## 2020-05-03 LAB — GLUCOSE, CAPILLARY
Glucose-Capillary: 120 mg/dL — ABNORMAL HIGH (ref 70–99)
Glucose-Capillary: 125 mg/dL — ABNORMAL HIGH (ref 70–99)

## 2020-05-03 LAB — CBC
HCT: 45.8 % (ref 36.0–46.0)
Hemoglobin: 13.3 g/dL (ref 12.0–15.0)
MCH: 29.9 pg (ref 26.0–34.0)
MCHC: 29 g/dL — ABNORMAL LOW (ref 30.0–36.0)
MCV: 102.9 fL — ABNORMAL HIGH (ref 80.0–100.0)
Platelets: 284 K/uL (ref 150–400)
RBC: 4.45 MIL/uL (ref 3.87–5.11)
RDW: 14.6 % (ref 11.5–15.5)
WBC: 6.4 K/uL (ref 4.0–10.5)
nRBC: 0 % (ref 0.0–0.2)

## 2020-05-03 LAB — BASIC METABOLIC PANEL WITH GFR
Anion gap: 12 (ref 5–15)
BUN: 8 mg/dL (ref 8–23)
CO2: 36 mmol/L — ABNORMAL HIGH (ref 22–32)
Calcium: 9 mg/dL (ref 8.9–10.3)
Chloride: 91 mmol/L — ABNORMAL LOW (ref 98–111)
Creatinine, Ser: 0.55 mg/dL (ref 0.44–1.00)
GFR, Estimated: >60 mL/min
Glucose, Bld: 110 mg/dL — ABNORMAL HIGH (ref 70–99)
Potassium: 4 mmol/L (ref 3.5–5.1)
Sodium: 139 mmol/L (ref 135–145)

## 2020-05-03 MED ORDER — ENSURE MAX PROTEIN PO LIQD
11.0000 [oz_av] | Freq: Two times a day (BID) | ORAL | Status: DC
  Filled 2020-05-03 (×2): qty 330

## 2020-05-03 MED ORDER — FUROSEMIDE 20 MG PO TABS: 20.0000 mg | ORAL_TABLET | Freq: Every day | ORAL | 0 refills | Status: DC

## 2020-05-03 NOTE — TOC Transition Note (Addendum)
Transition of Care [facility]) - CM/SW Discharge Note   Patient Details  Name: ONDINE GEMME MRN: [id number] Date of Birth: 10-20-1955  [facility] of [facility]) CM/SW Contact:  Zenon Mayo, RN Phone Number: 05/03/2020, 9:37 AM   Clinical Narrative:    Patient is for dc today, NCM offered choice for HHPT, she states she does not have a preference, NCM made referral to [place] with Doroteo Bradford, she is able to take referral.  Soc will begin within 24 to 48 hrs post dc.  Jermaine with [employer] will set up home oxygen and NIV for patient.  NCM notified him that patient is for dc today.  He will bring the oxygen to the patient's room and then she can go home, the respiratory therapist will be going to her home this evening to do setting and education with patient.    Final next level of care: [place] Barriers to Discharge: No Barriers Identified   Patient Goals and CMS Choice Patient states their goals for this hospitalization and ongoing recovery are:: get better CMS Medicare.gov Compare Post Acute Care list provided to:: Patient Choice offered to / list presented to : Patient  Discharge Placement                       Discharge Plan and Services                DME Arranged: Oxygen,NIV DME Agency: [facility] Date DME Agency Contacted: 05/02/20 Time DME Agency Contacted: 1000 Representative spoke with at DME Agency: Brenton Grills HH Arranged: PT [place]: [facility] Date [place]: 05/03/20 Time [place]: [phone] Representative spoke with at [place]: [place] ([place]) Interventions     Readmission Risk Interventions No flowsheet data found.

## 2020-05-03 NOTE — Progress Notes (Signed)
All set to go home. o2 thank delivered. awaiting  ride home.

## 2020-05-03 NOTE — Discharge Instructions (Signed)
Tips to Support Weight Loss  You will have a better chance of succeeding at weight loss by changing your eating habits. Changing habits can be difficult, so this handout offers tips to help you achieve your goal.  Set Yourself up for Success Keeping a food diary to record everything you eat and drink can help you be more mindful of your food choices. Your diary is your tool, so use whatever approach you like best: smartphone app, website, or paper/pencil. Record what you eat or drink immediately after you finish eating. It might seem overwhelming to write down everything. Start with the meal or time of day which you find the greatest challenge to manage.  Additional strategies that can set you up for success:  Measure your foods to be sure they match the portions on your daily plan. Keep your measuring cups and food scale on the kitchen counter to help remember to measure.  Find a friend to support your change in habits. Be sure to tell your friend what kind of help will meet your needs. For example, do you need a walking partner, or do you want a sounding board for when the eating plan is challenging?  Believe in yourself. Changing behavior is not just about willpower. It is also about believing you can do it. Your registered dietitian nutritionist (RDN) can recommend resources to help you succeed.  Consider adopting the following strategies to help with weight loss:  Keep fresh fruit on your kitchen counter or at eye-level in the refrigerator so you are reminded it's available for a snack.  Consider using a meal delivery service for a few weeks to decrease trips to the grocery store, cut down on meal preparation effort, and to get recipe ideas. To balance the cost of this service, cut back on dining out.  Add your own vegetables to frozen meals designed for weight-loss to get extra nutrients.  Enjoy liquid meal replacements with a side dish of salad or fruit.  Cooking to Lose Weight  If  you never [place] for yourself, now is a great time to start! Cooking for yourself is one of the best ways to control your diet and know exactly what ingredients are in the food.  Think about how you prepare foods to figure out where you can cut some calories and fat without losing flavor. You may not even realize how many calories are adding up with certain cooking methods. Here are some ideas:     Food Item/Ingredient Strategy  Oil Use a nonstick skillet and use just a splash of oil (1-2 tablespoons) instead of deep frying.  Seasoning Adding herbs and spices can help you boost flavor while cutting down on fats like butter and oil. Parsley, chive, garlic, onion powder, paprika, pepper, and ginger will help make meals more flavorful.  Dairy Use lower-fat dairy products in place of full-fat versions. For example, use low-fat or skim [place] yogurt instead of sour cream, or skim or 1% milk instead of 2% or whole milk, or light margarine instead of solid margarine or butter.  Vegetables Vegetables taste better when roasted or browned. Try it with your favorite vegetables. Find ways to add vegetables to your meals, like scrambled eggs with green peppers, roasted broccoli in macaroni and cheese, or finely diced mushrooms and zucchini in pasta sauce, or vegetables instead of meat on pizza.  Fruit Find interesting ways to prepare fruits. For example, eat apple wedges with a dab of peanut butter and sprinkle of cinnamon or  add a dollop of light whipped cream and a teaspoon of granola to fresh or canned peaches.   Grocery Shopping Strategies   Buy fresh or frozen fruits and vegetables that you enjoy so they are ready to snack on or to add to meals.  Wash and cut up vegetables as soon as you get home from the store so they are ready to eat.  Consider leaving snack chips, cookies, sweets, and other similar foods off your shopping list. You may choose to add them at a later time when you figure out how they fit  into your food plan.

## 2020-05-03 NOTE — Progress Notes (Signed)
Initial Nutrition Assessment  RD working remotely.  DOCUMENTATION CODES:   Morbid obesity  INTERVENTION:   - Ensure Max po BID, each supplement provides 150 kcal and 30 grams of protein  - Diet education handouts attached to pt's AVS/Discharge Instructions  NUTRITION DIAGNOSIS:   Increased nutrient needs related to chronic illness as evidenced by estimated needs.  GOAL:   Patient will meet greater than or equal to 90% of their needs  MONITOR:   PO intake,Supplement acceptance,Labs,Weight trends,I & O's  REASON FOR ASSESSMENT:   Consult Assessment of nutrition requirement/status,Diet education  ASSESSMENT:   65 year old female who presented to the ED on 1/31 with SOB. PMH of OSA noncompliant with CPAP, asthma, T2DM, HTN, fibromyalgia, GERD. Pt admitted with acute on chronic hypoxic and hypercapnic respiratory failure likely secondary to obesity hypoventilation syndrome.   Per CCM, possible component of acute on chronic CHF as well.  Unable to reach pt via phone call to hospital room x 3. RD will attach educational materials from the [employer] to pt's AVS/Discharge Instructions and follow up for in-person diet education as schedule allows.  Meal completions have been good. RD will add Ensure Max to aid pt in meeting protein needs.  Meal Completion: 50-100%  Medications reviewed and include: lasix, SSI  Labs reviewed: hemoglobin A1C 6.3 CBG's: 103-167 x 24 hours  UOP: 2000 ml x 24 hours I/O's: -2.9 L since admit  NUTRITION - FOCUSED PHYSICAL EXAM:  Unable to complete at this time. RD working remotely.  Diet Order:   Diet Order            Diet - low sodium heart healthy           Diet heart healthy/carb modified Room service appropriate? Yes; Fluid consistency: Thin  Diet effective now                 EDUCATION NEEDS:   Education needs have been addressed  Skin:  Skin Assessment: Reviewed RN Assessment  Last BM:  05/03/20  large type 6  Height:   Ht Readings from Last 1 Encounters:  04/30/20 5' 5.5" (1.664 m)    Weight:   Wt Readings from Last 1 Encounters:  05/03/20 (!) 169.7 kg    BMI:  Body mass index is 61.31 kg/m.  Estimated Nutritional Needs:   Kcal:  2200-2400  Protein:  110-130 grams  Fluid:  2.0 L/day    Gustavus Bryant, MS, RD, LDN Inpatient Clinical Dietitian Please see AMiON for contact information.

## 2020-05-03 NOTE — Progress Notes (Signed)
Patient asked to be off the BiPap after 3.5 hours despite education. Patient put back on 2L  with O2Sats in the mid 90's.

## 2020-05-03 NOTE — Care Management Important Message (Signed)
Important Message  Patient Details  Name: Tricia Perry MRN: [id number] Date of Birth: July 03, 1955   Medicare Important Message Given:  Yes     Orbie Pyo 05/03/2020, 2:23 PM

## 2020-05-03 NOTE — Progress Notes (Signed)
Discharged home accompanied by spouse , belonging taken  Home.

## 2020-05-03 NOTE — Discharge Summary (Signed)
Name: Tricia BurkeDiane C Farra MRN: [id number] DOB: 1955-05-10 65 y.o. PCP: Clemencia CourseHancock, Madison Frazier, PA-C  Date of Admission: 04/30/2020  5:57 AM Date of Discharge: 05/03/2020 Attending Physician: Earl LagosNarendra, Nischal, MD  Discharge Diagnosis: 1.  Acute respiratory failure with hypercapnia and hypoxia 2.  Obstructive sleep apnea 3.  Obesity hypoventilation syndrome 4.  Volume overload  Discharge Medications: Allergies as of 05/03/2020      Reactions   Gabapentin Swelling   Legs    Mold Extract [trichophyton] Nausea And Vomiting, Other (See Comments)   Sneezing real bad    Morphine Itching, Other (See Comments)   back hurts   Codeine Itching, Other (See Comments)   Back hurts      Medication List    STOP taking these medications   azelastine 0.1 % nasal spray Commonly known as: ASTELIN   cyclobenzaprine 10 MG tablet Commonly known as: FLEXERIL   dicyclomine 20 MG tablet Commonly known as: BENTYL   docusate sodium 100 MG capsule Commonly known as: COLACE   lactulose 10 GM/15ML solution Commonly known as: CHRONULAC   methocarbamol 500 MG tablet Commonly known as: ROBAXIN   promethazine 25 MG tablet Commonly known as: PHENERGAN     TAKE these medications   amLODipine 5 MG tablet Commonly known as: NORVASC Take 5 mg by mouth daily.   cholecalciferol 25 MCG (1000 UNIT) tablet Commonly known as: VITAMIN D3 Take 1,000 Units by mouth daily.   furosemide 20 MG tablet Commonly known as: Lasix Take 1 tablet (20 mg total) by mouth daily for 2 days. What changed:   medication strength  how much to take   IRON PO Take 1 tablet by mouth daily.   levothyroxine 200 MCG tablet Commonly known as: SYNTHROID TAKE ONE TABLET BY MOUTH ONE TIME DAILY before breakfast   losartan 100 MG tablet Commonly known as: COZAAR Take 100 mg by mouth daily.   metFORMIN 500 MG tablet Commonly known as: GLUCOPHAGE Take 1 tablet (500 mg total) by mouth 2 (two) times daily with a meal. What  changed: when to take this   montelukast 10 MG tablet Commonly known as: SINGULAIR Take 10 mg by mouth in the morning.   VITAMIN A PO Take 2 capsules by mouth daily.   vitamin C 500 MG tablet Commonly known as: ASCORBIC ACID Take 500 mg by mouth daily.   Vitamin D (Ergocalciferol) 1.25 MG (50000 UNIT) Caps capsule Commonly known as: DRISDOL Take 50,000 Units by mouth once a week. Take on Thursdays   Vitamin E 100 units Tabs Take 1 tablet by mouth daily.            Durable Medical Equipment  (From admission, onward)         Start     Ordered   05/03/20 0909  For home use only DME oxygen  Once       Question Answer Comment  Length of Need 6 Months   Mode or (Route) Nasal cannula   Liters per Minute 2   Frequency Continuous (stationary and portable oxygen unit needed)   Oxygen delivery system Gas      05/03/20 0909          Disposition and follow-up:   [facility]  At the hospital follow up visit please address:  1.    Acute respiratory failure with hypercapnia and hypoxia Follow-up with PCP and pulmonology Please ensure the patient is using her  NIV device at night Please assess her need for oxygen Patient was instructed to take two more days of Lasix 20 mg p.o., please recheck BMP   2.  Labs / imaging needed at time of follow-up: BMP, CXR  3.  Pending labs/ test needing follow-up: N/A   Follow-up Appointments:  Follow-up Information    Parrett, Fonnie Mu, NP Follow up on 05/16/2020.   Specialty: Pulmonary Disease Why: 9:30 AM [facility] Contact information: [address] [place] [address] [phone]        [facility] Follow up.   Specialty: Home Health Services Why: HH[facility] Contact information: [address] [place] [id number] [phone]              HPI Patient is sitting on recliner chair during examination  today.  She appears comfortable and in no acute distress.  Satting well on 2 L.  Patient states that she still feels uncomfortable with the BiPAP machine at night but she will try to use it.  Emphasized the importance of using her NIV device every night.  Patient also expressed the need to lose weight to help with her breathing.  She is advised to follow-up with her PCP in 1 week after discharge and with pulmonology as scheduled.  Hospital Course by problem list:  1.  Acute hypoxic respiratory failure with hypoxia and hypercapnia Patient presents to the ED with shortness of breath and new oxygen requirement likely due to underlying OSA/OHS and volume overload.  She was seen at [place] [facility] ED and left AMA after a negative Covid test and a negative CTA.  Patient then admitted to [facility] for shortness of breath and was placed on BiPAP and also left AMA.  Chest x-ray done here showed a right lower lobe opacity with pulmonary vascular congestion.  Patient was initially started on ceftriaxone for suspected community-acquired pneumonia and was discontinued on day 2 given the lack of signs and symptoms of pneumonia.  Patient was placed on BiPAP at night and require 2-3L of oxygen at daytime.  Pulmonology was consulted and recommended home NIV device for use at night and daytime.  Patient will follow up with pulmonology after discharge.   Patient is also found to be a volume overloaded on exam with JVD, LE edema and pulmonary vascular congestion on chest x-ray.  Echocardiogram showed normal EF and diastolic function. She was diuresed with IV Lasix and had good urine output.  Patient will continue two more days p.o. Lasix 20 mg after discharge and follow-up with PCP.  Discharge Exam:   BP (!) 141/72 (BP Location: Left Wrist)   Pulse 97   Temp 98.2 F (36.8 C) (Oral)   Resp (!) 25   Ht 5' 5.5" (1.664 m)   Wt (!) 169.7 kg   SpO2 92%   BMI 61.31 kg/m  Discharge exam: Physical  Exam Constitutional:      General: She is not in acute distress.    Appearance: She is obese.  HENT:     Head: Normocephalic.  Eyes:     General:        Right eye: No discharge.        Left eye: No discharge.  Cardiovascular:     Rate and Rhythm: Normal rate and regular rhythm.  Pulmonary:     Effort: Pulmonary effort is normal. No respiratory distress.     Breath sounds: Normal breath sounds.  Musculoskeletal:  Cervical back: Normal range of motion.     Right lower leg: Edema (trace) present.     Left lower leg: Edema (trace) present.  Skin:    General: Skin is warm.  Neurological:     Mental Status: She is alert.  Psychiatric:        Mood and Affect: Mood normal.     Pertinent Labs, Studies, and Procedures:  CBC Latest Ref Rng & Units 05/03/2020 05/02/2020 05/02/2020  WBC 4.0 - 10.5 K/uL 6.4 - 9.1  Hemoglobin 12.0 - 15.0 g/dL 13.3 14.6 13.7  Hematocrit 36.0 - 46.0 % 45.8 43.0 46.1(H)  Platelets 150 - 400 K/uL 284 - 272   CMP Latest Ref Rng & Units 05/03/2020 05/02/2020 05/02/2020  Glucose 70 - 99 mg/dL 110(H) - 124(H)  BUN 8 - 23 mg/dL 8 - 7(L)  Creatinine 0.44 - 1.00 mg/dL 0.55 - 0.55  Sodium 135 - 145 mmol/L 139 134(L) 139  Potassium 3.5 - 5.1 mmol/L 4.0 4.0 4.3  Chloride 98 - 111 mmol/L 91(L) - 95(L)  CO2 22 - 32 mmol/L 36(H) - 35(H)  Calcium 8.9 - 10.3 mg/dL 9.0 - 8.9  Total Protein 6.5 - 8.1 g/dL - - -  Total Bilirubin 0.3 - 1.2 mg/dL - - -  Alkaline Phos 38 - 126 U/L - - -  AST 15 - 41 U/L - - -  ALT 0 - 44 U/L - - -   DG Chest 2 View  Result Date: 04/30/2020  IMPRESSION: Patchy right lower lobe opacity, atelectasis versus pneumonia, possibly with superimposed right pleural effusion. Pulmonary vascular congestion. Mild interstitial edema is possible, although this appearance may be exacerbated by poor inspiration. Electronically Signed   By: Julian Hy M.D.   On: 04/30/2020 11:57   DG Chest Portable 1 View  Result Date: 04/30/2020 CLINICAL DATA:   Shortness of breath EXAM: PORTABLE CHEST 1 VIEW COMPARISON:  04/21/2020 FINDINGS: Cardiomegaly and vascular pedicle widening primarily from mediastinal fat and rotation based on recent CT. Hazy appearance of the bilateral chest with suspected pleural effusion on the right at least. No pneumothorax. IMPRESSION: Cardiomegaly and vascular congestion. Asymmetric density at the right base could be from pleural fluid or airspace disease. Electronically Signed   By: Monte Fantasia M.D.   On: 04/30/2020 07:03   ECHOCARDIOGRAM COMPLETE  Result Date: 04/30/2020    ECHOCARDIOGRAM REPORT     IMPRESSIONS  1. Left ventricular ejection fraction, by estimation, is 60 to 65%. The left ventricle has normal function. Left ventricular endocardial border not optimally defined to evaluate regional wall motion. Left ventricular diastolic parameters were normal.  2. Right ventricular systolic function was not well visualized. The right ventricular size is normal.  3. The mitral valve was not well visualized. No evidence of mitral valve regurgitation. No evidence of mitral stenosis.  4. The aortic valve was not well visualized. Aortic valve regurgitation is not visualized. No aortic stenosis is present.  5. The inferior vena cava is normal in size with greater than 50% respiratory variability, suggesting right atrial pressure of 3 mmHg.   Discharge Instructions: Discharge Instructions    Call MD for:  difficulty breathing, headache or visual disturbances   Complete by: As directed    Diet - low sodium heart healthy   Complete by: As directed    Discharge instructions   Complete by: As directed    Ms. Prine,  It is a pleasure taking care of you during this admission.  You were admitted  for shortness of breath due to obstructive sleep apnea and volume overload.  We have you try using BiPAP at night in the hospital which helped with your breathing.  We also diurese to help remove some fluid off your body.  Respiratory  therapist will help set up your breathing machine for you at home.  It is very important that you have to use it every night.  We also have oxygen for you to use during the daytime and with ambulation.  Please continue taking two more days of Lasix 20 mg to continue to remove fluid off your body.  Please follow-up with your primary care doctor next week.  Please follow-up with the lung doctor as scheduled.  Take care   Increase activity slowly   Complete by: As directed       Signed: Gaylan Gerold, DO 05/03/2020, 10:21 AM   Pager: [phone]

## 2020-05-16 ENCOUNTER — Inpatient Hospital Stay: Payer: Medicare Other | Admitting: Adult Health

## 2020-05-17 ENCOUNTER — Other Ambulatory Visit: Payer: Self-pay

## 2020-05-17 ENCOUNTER — Ambulatory Visit: Payer: Medicare Other | Admitting: Adult Health

## 2020-05-17 ENCOUNTER — Encounter: Payer: Self-pay | Admitting: Adult Health

## 2020-05-17 VITALS — BP 130/90 | HR 110 | Temp 97.1°F | Ht 65.5 in | Wt 278.0 lb

## 2020-05-17 DIAGNOSIS — J9621 Acute and chronic respiratory failure with hypoxia: Secondary | ICD-10-CM

## 2020-05-17 DIAGNOSIS — R0602 Shortness of breath: Secondary | ICD-10-CM

## 2020-05-17 DIAGNOSIS — J9622 Acute and chronic respiratory failure with hypercapnia: Secondary | ICD-10-CM

## 2020-05-17 DIAGNOSIS — I5189 Other ill-defined heart diseases: Secondary | ICD-10-CM | POA: Insufficient documentation

## 2020-05-17 DIAGNOSIS — J986 Disorders of diaphragm: Secondary | ICD-10-CM | POA: Diagnosis not present

## 2020-05-17 MED ORDER — FUROSEMIDE 20 MG PO TABS: 20.0000 mg | ORAL_TABLET | Freq: Every day | ORAL | 1 refills | Status: DC

## 2020-05-17 NOTE — Assessment & Plan Note (Signed)
Recurrent hospitalizations due to acute on chronic hypoxic and hypercarbic respiratory failure is felt to be secondary to underlying obstructive sleep apnea and OHS.Marland Kitchen Patient is now on noninvasive support with trilogy device.  Patient is continue on current device at nighttime.  Have asked her to wear it all night long while she is sleeping and to use with naps.  Patient is also to continue on oxygen 2 L to maintain O2 saturations greater than 90%. Plan  Patient Instructions  Continue on Oxygen 2l/m  Wear TRILOGY machine all night while sleeping and wear with naps.  Restart Lasix 20mg  daily  Low salt diet  Labs today .  Follow up with Primary MD and Cardiology as discussed .  Follow up with Dr Loanne Drilling in 4 weeks with chest xray and As needed   Please contact office for sooner follow up if symptoms do not improve or worsen or seek emergency care

## 2020-05-17 NOTE — Assessment & Plan Note (Deleted)
Patient with recent volume overload/pulmonary edema with acute on chronic respiratory failure.  Patient is improved with oxygen and noninvasive support.  Also will continue on Lasix 20 mg daily.  Refill for Lasix was given.  Labs today with be met.  

## 2020-05-17 NOTE — Patient Instructions (Addendum)
Continue on Oxygen 2l/m  Wear TRILOGY machine all night while sleeping and wear with naps.  Restart Lasix 20mg  daily  Low salt diet  Labs today .  Follow up with Primary MD and Cardiology as discussed .  Follow up with Dr Loanne Drilling in 4 weeks with chest xray and As needed   Please contact office for sooner follow up if symptoms do not improve or worsen or seek emergency care

## 2020-05-17 NOTE — Assessment & Plan Note (Signed)
Patient with recent volume overload/pulmonary edema with acute on chronic respiratory failure.  Patient is improved with oxygen and noninvasive support.  Also will continue on Lasix 20 mg daily.  Refill for Lasix was given.  Labs today with be met.

## 2020-05-17 NOTE — Assessment & Plan Note (Signed)
Morbid obesity patient is encouraged on healthy weight loss.  Continue follow-up with primary care provider

## 2020-05-17 NOTE — Progress Notes (Signed)
'@Patient'  ID: Tricia Perry, female    DOB: January 22, 1956, 65 y.o.   MRN: [id number]  Chief Complaint  Patient presents with  . Hospitalization Follow-up    Referring provider: Dayton Scrape*  HPI: 65 year old female minimum smoking history seen for pulmonary consult April 24, 2020 for obstructive sleep apnea and hypoxemia  TEST/EVENTS :  CTA 03/16/19 Low lung volumes. Large right epicardial fat CXR 04/21/20 Prominent right epicardial fat. No infiltrate. CT chest January 26 - for PE.  Lungs are clear. 2D echo May 01, 2020 normal LVEF.  PFT: FVC 2.08 (63%) FEV1 1.56 (61%) Ratio 75   Interpretation: Reduced FEV1 and FVC which can suggest restrictive defect. No obstructive defect on spirometry.  05/17/2020 Follow up : Hypoxia and post hospital follow-up Patient returns for a follow-up visit.  Patient has been in the hospital in emergency room several times over the last month for respiratory problems. Patient was readmitted earlier this month.  Found to have acute on chronic respiratory failure with hypoxemia and hypercarbia with pulmonary edema.  She was started on trilogy noninvasive vent at bedtime. She has been felt to have  untreated sleep apnea and likely OHS that are contributing to her symptoms. ABG on February 1 showed a pH of 7.2 and a PCO2 of 86 with a PO2 at 74. I discharge patient was started on trilogy at bedtime.  Patient says she is starting to wear this.  She says she gets in at least 4 hours every night.  I discussed with her the importance of wearing it all night while she is sleeping and if she takes a nap.  She is also on oxygen 2 L at all times and with her trilogy device.    She is on O2 2l/m and with Trilogy At bedtime  .  Patient does request that her Lasix be refilled.  She has no further refills.  She is currently on Lasix 20 mg daily.  She says it has helped with her lower extremity swelling. Since discharge patient says she is feeling better.  She  has less shortness of breath.  She still remains very weak.  Gets winded with minimum activities.  Uses a walker to try to get around.  She does feel like the oxygen is helping.   Allergies  Allergen Reactions  . Gabapentin Swelling    Legs   . Mold Extract [Trichophyton] Nausea And Vomiting and Other (See Comments)    Sneezing real bad   . Morphine Itching and Other (See Comments)    back hurts  . Codeine Itching and Other (See Comments)    Back hurts    Immunization History  Administered Date(s) Administered  . Influenza Split 03/31/2008, 12/30/2010  . Influenza Whole 05/10/2010  . Influenza,inj,Quad PF,6+ Mos 05/18/2014  . Influenza,inj,Quad PF,6-35 Mos 01/14/2016, 12/03/2016  . Moderna SARS-COV2 Booster Vaccination 03/27/2020  . Moderna Sars-Covid-2 Vaccination 05/13/2019, 06/10/2019  . Pneumococcal Polysaccharide-23 10/28/2012  . Td 03/31/2001  . Tdap 04/01/2003, 10/28/2012    Past Medical History:  Diagnosis Date  . Arthritis    Knees; right-TKR, left-bone on bone/end stage  . Asthma   . Carpal tunnel syndrome on both sides 06/21/2010  . Carpal tunnel syndrome, bilateral   . Chronic nonallergic rhinitis    allergy eval NEG 10/2014: azelastine and flonase spray rx'd  . Chronic venous insufficiency    with edema  and extensive varicose dz: Dr. Linus Mako is managing this, planning laser procedures.; A LOT OF LEG CRAMPS  AND LEG STIFFNESS  . Complication of anesthesia    STATES SHE WAS TOLD SHE WOKE UP DURING HER KNEE REPLACEMENT SURGERY AND HAD TO BE GIVEN MORE MEDICINE - NOT SURE IF SPINAL OR GENERAL ANESTHESIA  . DDD (degenerative disc disease)    Dr. [name] evaluated her and recommended pain mgmt MD.  She saw Dr. Selinda Orion for pain mgmt at one time.  . Diabetes mellitus without complication (HCC)    Type II  . Disequilibrium syndrome    MRI brain normal 2012  . DIZZINESS 04/12/2010   Qualifier: Diagnosis of  By: Anitra Lauth M.D., Brien Few   . Fibromyalgia     questionable  . GERD (gastroesophageal reflux disease)   . Heart murmur    functional, w/u done; PALPITATIONS IN THE PAST  . History of bronchitis   . History of palpitations   . History of pulmonary embolism 05/2013   Right sided.  Setting: post-op percutaneous nephrolithotomy--xarelto x 6 mo  . Hypertension   . Hypothyroidism   . Insomnia   . Insulin resistance   . Kidney stone on right side 12/2014   Dr. Jeffie Pollock considering PCNL, ESWL, and ureteroscopy as of 01/25/15   . Microscopic hematuria 2014   CT abd pelv showed 1.8 cm right renal pelvis stone.  Also had UA c/w UTI so abx given.   . Mixed incontinence urge and stress    and nocturia x 5-6 per night  . Morbid obesity (HCC)    BMI 49.  Gastric stapling surgery in distant past.  . Nephrolithiasis    Lithotripsy 2002.  Right percut nephrolith 05/2013.  Marland Kitchen Nocturia   . Numbness and tingling    bilateral hands and feet  . OAB (overactive bladder)    Per Dr. Jeffie Pollock: pt declined pelvic floor PT.  Vesicare trial started 01/25/15.  . Obesity hypoventilation syndrome ([place])   . Peripheral neuropathy    No old records to confirm pt's report.  Pt refuses to have more NCS b/c test is painful.  Marland Kitchen PONV (postoperative nausea and vomiting)   . Shortness of breath    CHRONIC; 3 mo trial off of ACE-I made no difference.  Spirometry x 2 has shown no obstruction.  Allergy w/u NEG.  . Shoulder pain    HX OF BILATERAL SHOULDER SURGERIES - PT HAS VERY LIMITED ROM - ESPECIALLY RAISING HER ARMS  . Sleep apnea    PT DOES NOT HAVE MASK OR TUBING - JUST SLEEPS WITH HEAD ELEVATED ON 2 PILLOWS   . Spinal stenosis    with L5 radiculopathy, also pseudospondylolisthesis per Dr. [name]  . Superficial thrombophlebitis of left leg 05/2013   with cellulitis--resolved approp with abx and ice  . Urinary incontinence   . Urinary urgency   . Vertigo     Tobacco History: Social History   Tobacco Use  Smoking Status Former Smoker  . Packs/day: 1.00  . Years:  0.00  . Pack years: 0.00  . Types: Cigarettes  . Start date: 05/17/1976  . Quit date: 03/31/1998  . Years since quitting: 22.1  Smokeless Tobacco Never Used   Counseling given: Not Answered   Outpatient Medications Prior to Visit  Medication Sig Dispense Refill  . albuterol (VENTOLIN HFA) 108 (90 Base) MCG/ACT inhaler albuterol sulfate HFA 90 mcg/actuation aerosol inhaler  INL 2 PFS PO Q 4 TO 6 H PRF WHZ OR SOB    . amLODipine (NORVASC) 5 MG tablet Take 5 mg by mouth daily.    Marland Kitchen  cholecalciferol (VITAMIN D3) 25 MCG (1000 UNIT) tablet Take 1,000 Units by mouth daily.    . Ferrous Sulfate (IRON PO) Take 1 tablet by mouth daily.    Marland Kitchen levothyroxine (SYNTHROID, LEVOTHROID) 200 MCG tablet TAKE ONE TABLET BY MOUTH ONE TIME DAILY before breakfast 30 tablet 11  . losartan (COZAAR) 100 MG tablet Take 100 mg by mouth daily.    . metFORMIN (GLUCOPHAGE) 500 MG tablet Take 1 tablet (500 mg total) by mouth 2 (two) times daily with a meal. (Patient taking differently: Take 500 mg by mouth daily with breakfast.) 60 tablet 11  . montelukast (SINGULAIR) 10 MG tablet Take 10 mg by mouth in the morning.    Marland Kitchen VITAMIN A PO Take 2 capsules by mouth daily.    . vitamin C (ASCORBIC ACID) 500 MG tablet Take 500 mg by mouth daily.    . Vitamin D, Ergocalciferol, (DRISDOL) 1.25 MG (50000 UNIT) CAPS capsule Take 50,000 Units by mouth once a week. Take on Thursdays    . Vitamin E 100 units TABS Take 1 tablet by mouth daily.    . furosemide (LASIX) 20 MG tablet Take 1 tablet (20 mg total) by mouth daily for 2 days. 2 tablet 0   No facility-administered medications prior to visit.     Review of Systems:   Constitutional:   No  weight loss, night sweats,  Fevers, chills,  +fatigue, or  lassitude.  HEENT:   No headaches,  Difficulty swallowing,  Tooth/dental problems, or  Sore throat,                No sneezing, itching, ear ache, nasal congestion, post nasal drip,   CV:  No chest pain,  Orthopnea, PND, ++swelling  in lower extremities, no anasarca, dizziness, palpitations, syncope.   GI  No heartburn, indigestion, abdominal pain, nausea, vomiting, diarrhea, change in bowel habits, loss of appetite, bloody stools.   Resp:    No chest wall deformity  Skin: no rash or lesions.  GU: no dysuria, change in color of urine, no urgency or frequency.  No flank pain, no hematuria   MS:  No joint pain or swelling.  No decreased range of motion.  No back pain.    Physical Exam  BP 130/90 (BP Location: Left Arm, Cuff Size: Large)   Pulse (!) 110   Temp (!) 97.1 F (36.2 C) (Temporal)   Ht 5' 5.5" (1.664 m)   Wt 278 lb (126.1 kg)   SpO2 93%   BMI 45.56 kg/m   GEN: A/Ox3; pleasant , NAD, morbidly obese, rolling walker   HEENT:  [place]/AT,    NOSE-clear, THROAT-clear, no lesions, no postnasal drip or exudate noted.   NECK:  Supple w/ fair ROM; no JVD; normal carotid impulses w/o bruits; no thyromegaly or nodules palpated; no lymphadenopathy.    RESP  Clear  P & A; w/o, wheezes/ rales/ or rhonchi. no accessory muscle use, no dullness to percussion  CARD:  RRR, no m/r/g, 1+ peripheral edema, pulses intact, no cyanosis or clubbing.  GI:   Soft & nt; nml bowel sounds; no organomegaly or masses detected.   Musco: Warm bil, no deformities or joint swelling noted.   Neuro: alert, no focal deficits noted.    Skin: Warm, no lesions or rashes    Lab Results:  CBC    Component Value Date/Time   WBC 6.4 05/03/2020 0025   RBC 4.45 05/03/2020 0025   HGB 13.3 05/03/2020 0025   HCT 45.8  05/03/2020 0025   PLT 284 05/03/2020 0025   MCV 102.9 (H) 05/03/2020 0025   MCH 29.9 05/03/2020 0025   MCHC 29.0 (L) 05/03/2020 0025   RDW 14.6 05/03/2020 0025   LYMPHSABS 1.3 04/30/2020 0652   MONOABS 0.6 04/30/2020 0652   EOSABS 0.3 04/30/2020 0652   BASOSABS 0.1 04/30/2020 0652    BMET    Component Value Date/Time   NA 139 05/03/2020 0025   K 4.0 05/03/2020 0025   CL 91 (L) 05/03/2020 0025   CO2 36 (H)  05/03/2020 0025   GLUCOSE 110 (H) 05/03/2020 0025   BUN 8 05/03/2020 0025   CREATININE 0.55 05/03/2020 0025   CREATININE 0.70 12/19/2013 1343   CALCIUM 9.0 05/03/2020 0025   GFRNONAA >60 05/03/2020 0025   GFRNONAA >60 06/21/2010 0000   GFRAA >60 03/16/2019 2104   GFRAA >60 06/21/2010 0000    BNP    Component Value Date/Time   BNP 24.1 04/30/2020 0652    ProBNP    Component Value Date/Time   PROBNP 37.0 11/10/2013 1612    Imaging: DG Chest 2 View  Result Date: 04/30/2020 CLINICAL DATA:  Shortness of breath, hypoxia EXAM: CHEST - 2 VIEW COMPARISON:  04/30/2020 at 0638 hours FINDINGS: Low lung volumes.  Patient is rotated. Patchy right lower lobe opacity, atelectasis versus pneumonia, possibly with superimposed right pleural effusion. Pulmonary vascular congestion. Mild interstitial edema is possible, although this appearance may be exacerbated by poor inspiration. No pneumothorax. Heart is top-normal in size. IMPRESSION: Patchy right lower lobe opacity, atelectasis versus pneumonia, possibly with superimposed right pleural effusion. Pulmonary vascular congestion. Mild interstitial edema is possible, although this appearance may be exacerbated by poor inspiration. Electronically Signed   By: Julian Hy M.D.   On: 04/30/2020 11:57   DG Chest Portable 1 View  Result Date: 04/30/2020 CLINICAL DATA:  Shortness of breath EXAM: PORTABLE CHEST 1 VIEW COMPARISON:  04/21/2020 FINDINGS: Cardiomegaly and vascular pedicle widening primarily from mediastinal fat and rotation based on recent CT. Hazy appearance of the bilateral chest with suspected pleural effusion on the right at least. No pneumothorax. IMPRESSION: Cardiomegaly and vascular congestion. Asymmetric density at the right base could be from pleural fluid or airspace disease. Electronically Signed   By: Monte Fantasia M.D.   On: 04/30/2020 07:03   DG Chest Portable 1 View  Result Date: 04/21/2020 CLINICAL DATA:  Shortness of  breath EXAM: PORTABLE CHEST 1 VIEW COMPARISON:  [facility] chest dated 04/19/2020 FINDINGS: Lungs are essentially clear.  No pleural effusion or pneumothorax. The heart is top-normal in size. Prominent right epicardial fat, better evaluated on prior CT. Visualized osseous structures are within normal limits. Old right posterior 4th rib fracture deformity is better visualized on CT. IMPRESSION: No evidence of acute cardiopulmonary disease. Electronically Signed   By: Julian Hy M.D.   On: 04/21/2020 13:03   ECHOCARDIOGRAM COMPLETE  Result Date: 04/30/2020    ECHOCARDIOGRAM REPORT   Patient Name:   AILANA CUADRADO Date of Exam: 04/30/2020 Medical Rec #:  [id number]    Height:       65.5 in Accession #:    [id number]   Weight:       380.2 lb Date of Birth:  02-03-1956    BSA:          2.614 m Patient Age:    76 years     BP:           141/87 mmHg Patient Gender: F  HR:           92 bpm. Exam Location:  Inpatient Procedure: 2D Echo, Color Doppler, Cardiac Doppler and Intracardiac            Opacification Agent Indications:    Cardiomegaly I51.7  History:        Patient has prior history of Echocardiogram examinations, most                 recent 12/16/2013. Risk Factors:Hypertension and Diabetes.  Sonographer:    Bernadene Person RDCS Referring Phys: [id number] [facility] PFEIFFER  Sonographer Comments: Patient is morbidly obese and no subcostal window. IMPRESSIONS  1. Left ventricular ejection fraction, by estimation, is 60 to 65%. The left ventricle has normal function. Left ventricular endocardial border not optimally defined to evaluate regional wall motion. Left ventricular diastolic parameters were normal.  2. Right ventricular systolic function was not well visualized. The right ventricular size is normal.  3. The mitral valve was not well visualized. No evidence of mitral valve regurgitation. No evidence of mitral stenosis.  4. The aortic valve was not well visualized. Aortic valve regurgitation  is not visualized. No aortic stenosis is present.  5. The inferior vena cava is normal in size with greater than 50% respiratory variability, suggesting right atrial pressure of 3 mmHg. FINDINGS  Left Ventricle: Left ventricular ejection fraction, by estimation, is 60 to 65%. The left ventricle has normal function. Left ventricular endocardial border not optimally defined to evaluate regional wall motion. Definity contrast agent was given IV to delineate the left ventricular endocardial borders. The left ventricular internal cavity size was normal in size. There is no left ventricular hypertrophy. Left ventricular diastolic parameters were normal. Normal left ventricular filling pressure. Right Ventricle: The right ventricular size is normal. No increase in right ventricular wall thickness. Right ventricular systolic function was not well visualized. Left Atrium: Left atrial size was normal in size. Right Atrium: Right atrial size was normal in size. Pericardium: There is no evidence of pericardial effusion. Mitral Valve: The mitral valve was not well visualized. No evidence of mitral valve regurgitation. No evidence of mitral valve stenosis. Tricuspid Valve: The tricuspid valve is not well visualized. Tricuspid valve regurgitation is not demonstrated. No evidence of tricuspid stenosis. Aortic Valve: The aortic valve was not well visualized. Aortic valve regurgitation is not visualized. No aortic stenosis is present. Pulmonic Valve: The pulmonic valve was not well visualized. Pulmonic valve regurgitation is not visualized. No evidence of pulmonic stenosis. Aorta: The aortic root is normal in size and structure. Venous: The inferior vena cava is normal in size with greater than 50% respiratory variability, suggesting right atrial pressure of 3 mmHg. IAS/Shunts: No atrial level shunt detected by color flow Doppler.  LEFT VENTRICLE PLAX 2D LVIDd:         4.30 cm  Diastology LVIDs:         2.60 cm  LV e' medial:    8.49  cm/s LV PW:         1.00 cm  LV E/e' medial:  12.2 LV IVS:        1.00 cm  LV e' lateral:   13.20 cm/s LVOT diam:     1.70 cm  LV E/e' lateral: 7.9 LV SV:         63 LV SV Index:   24 LVOT Area:     2.27 cm  RIGHT VENTRICLE RV S prime:     16.20 cm/s TAPSE (M-mode): 1.8 cm LEFT  ATRIUM             Index       RIGHT ATRIUM           Index LA diam:        3.60 cm 1.38 cm/m  RA Area:     12.90 cm LA Vol (A2C):   67.9 ml 25.97 ml/m RA Volume:   26.70 ml  10.21 ml/m LA Vol (A4C):   91.8 ml 35.12 ml/m LA Biplane Vol: 79.8 ml 30.53 ml/m  AORTIC VALVE LVOT Vmax:   140.00 cm/s LVOT Vmean:  104.000 cm/s LVOT VTI:    0.276 m  AORTA Ao Root diam: 3.30 cm MITRAL VALVE MV Area (PHT): 2.73 cm     SHUNTS MV Decel Time: 278 msec     Systemic VTI:  0.28 m MV E velocity: 104.00 cm/s  Systemic Diam: 1.70 cm MV A velocity: 87.40 cm/s MV E/A ratio:  1.19 Fransico Him MD Electronically signed by Fransico Him MD Signature Date/Time: 04/30/2020/2:41:51 PM    Final       PFT Results Latest Ref Rng & Units 12/12/2013  FVC-Pre L 2.37  FVC-Predicted Pre % 76  FVC-Post L 2.28  FVC-Predicted Post % 74  Pre FEV1/FVC % % 82  Post FEV1/FCV % % 85  FEV1-Pre L 1.94  FEV1-Predicted Pre % 81  FEV1-Post L 1.95  DLCO uncorrected ml/min/mmHg 23.99  DLCO UNC% % 111  DLVA Predicted % 140  TLC L 4.84  TLC % Predicted % 101  RV % Predicted % 117    No results found for: NITRICOXIDE      Assessment & Plan:   Acute on chronic respiratory failure with hypoxia and hypercapnia (HCC) Recurrent hospitalizations due to acute on chronic hypoxic and hypercarbic respiratory failure is felt to be secondary to underlying obstructive sleep apnea and OHS.Marland Kitchen Patient is now on noninvasive support with trilogy device.  Patient is continue on current device at nighttime.  Have asked her to wear it all night long while she is sleeping and to use with naps.  Patient is also to continue on oxygen 2 L to maintain O2 saturations greater than  90%. Plan  Patient Instructions  Continue on Oxygen 2l/m  Wear TRILOGY machine all night while sleeping and wear with naps.  Restart Lasix 30m daily  Low salt diet  Labs today .  Follow up with Primary MD and Cardiology as discussed .  Follow up with Dr ELoanne Drillingin 4 weeks with chest xray and As needed   Please contact office for sooner follow up if symptoms do not improve or worsen or seek emergency care        Morbid obesity ([facility] Morbid obesity patient is encouraged on healthy weight loss.  Continue follow-up with primary care provider  Diastolic dysfunction Patient with recent volume overload/pulmonary edema with acute on chronic respiratory failure.  Patient is improved with oxygen and noninvasive support.  Also will continue on Lasix 20 mg daily.  Refill for Lasix was given.  Labs today with be met.      TRexene Edison NP 05/17/2020

## 2020-05-18 ENCOUNTER — Telehealth: Payer: Self-pay | Admitting: *Deleted

## 2020-05-18 NOTE — Telephone Encounter (Signed)
Received a fax request from [facility] for a 90 day supply for fursemide.  Spoke with Rexene Edison NP, she stated that this is not a medication that our office is going to continue to prescribe, her pcp or cardiologist will need to provide further refills.    I called and spoke with patient and advised that I sent in a prescription to [facility] on 05/17/20 for 30 tablets with a refill.  Advised that will allow her time to see her pcp and establish with a cardiologist.  Referral sent to cardiologist today per her request to see Dr. Manley Mason, the physician a family mbr sees.  She verbalized understanding.  Nothing further needed.

## 2020-05-18 NOTE — Addendum Note (Signed)
Addended by: Vanessa Barbara on: 05/18/2020 12:15 PM   Modules accepted: Orders

## 2020-06-04 ENCOUNTER — Ambulatory Visit: Payer: Medicare Other | Admitting: Pulmonary Disease

## 2020-06-12 ENCOUNTER — Telehealth: Payer: Self-pay

## 2020-06-12 NOTE — Telephone Encounter (Signed)
Left voicemail for patient to bring SD card to visit tomorrow. If pt calls back, please advise.

## 2020-06-13 ENCOUNTER — Other Ambulatory Visit: Payer: Self-pay

## 2020-06-13 ENCOUNTER — Ambulatory Visit (INDEPENDENT_AMBULATORY_CARE_PROVIDER_SITE_OTHER): Payer: Medicare Other | Admitting: Primary Care

## 2020-06-13 ENCOUNTER — Ambulatory Visit (INDEPENDENT_AMBULATORY_CARE_PROVIDER_SITE_OTHER): Payer: Medicare Other

## 2020-06-13 VITALS — BP 134/84 | HR 84 | Temp 97.2°F | Wt 377.0 lb

## 2020-06-13 DIAGNOSIS — R0602 Shortness of breath: Secondary | ICD-10-CM

## 2020-06-13 DIAGNOSIS — J9622 Acute and chronic respiratory failure with hypercapnia: Secondary | ICD-10-CM

## 2020-06-13 DIAGNOSIS — I5189 Other ill-defined heart diseases: Secondary | ICD-10-CM

## 2020-06-13 DIAGNOSIS — J9621 Acute and chronic respiratory failure with hypoxia: Secondary | ICD-10-CM

## 2020-06-13 MED ORDER — FUROSEMIDE 20 MG PO TABS: 20.0000 mg | ORAL_TABLET | Freq: Every day | ORAL | 3 refills | Status: DC

## 2020-06-13 MED ORDER — ALBUTEROL SULFATE (2.5 MG/3ML) 0.083% IN NEBU: 2.5000 mg | INHALATION_SOLUTION | Freq: Four times a day (QID) | RESPIRATORY_TRACT | 12 refills | Status: DC | PRN

## 2020-06-13 NOTE — Progress Notes (Signed)
@Patient  ID: Tricia Perry, female    DOB: 24-Sep-1955, 65 y.o.   MRN: [id number]  Chief Complaint  Patient presents with  . Follow-up    SOB, coughing improved. 2L DME- [employer]    Referring provider: Dayton Scrape*  HPI: 65 year old female, former smoker quit in 2000.  Past medical history significant for OSA, chronic respiratory failure with hypoxia, hypertension, pulmonary embolism, hypothyroidism, diastolic dysfunction, obesity.  Patient of Dr. Loanne Drilling, last seen by pulmonary nurse practitioner on 05/17/2020 for shortness of breath.   06/13/2020 Patient presents today for 1 month follow-up.  During last visit she was asked to restart Lasix 20 mg daily and to follow a low-salt diet. Patient has had recurrent hospitalizations due to acute on chronic hypoxic and hypercarbic respiratory failure secondary to underlying OSA/OHS.  Patient is maintained on trilogy noninvasive ventilator at night.  She has been told to wear ventilator all night long whilst she is sleeping and also with naps during the day.  Chest x-ray on 04/30/2020 showed patchy right lower lobe opacity, atelectasis versus pneumonia, possible superimposed right pleural effusion.  Pulmonary vascular congestion.  Mild interstitial edema. Patient wears 2 L of oxygen continuously.   She is compliant with Trilogy ventilator every night. She does not use it during the day. She think she is holding onto a lot of fluid. She has been taking lasix 20mg  daily. Lymphedema right lower extremity. Her weight today is 377 (last visit documented 278lb but patient states that this was likely inaccurate as she has not been <300lbs in some time).  CXR today showed mild peribronchial thickening. Focal opacity at the right lung base. This likely reflects epicardial fat as seen anteriorly on the lateral view and on prior chest   Allergies  Allergen Reactions  . Gabapentin Swelling    Legs   . Mold Extract [Trichophyton] Nausea And Vomiting and  Other (See Comments)    Sneezing real bad   . Morphine Itching and Other (See Comments)    back hurts  . Codeine Itching and Other (See Comments)    Back hurts    Immunization History  Administered Date(s) Administered  . Influenza Split 03/31/2008, 12/30/2010  . Influenza Whole 05/10/2010  . Influenza,inj,Quad PF,6+ Mos 05/18/2014  . Influenza,inj,Quad PF,6-35 Mos 01/14/2016, 12/03/2016  . Moderna SARS-COV2 Booster Vaccination 03/27/2020  . Moderna Sars-Covid-2 Vaccination 05/13/2019, 06/10/2019  . Pneumococcal Polysaccharide-23 10/28/2012  . Td 03/31/2001  . Tdap 04/01/2003, 10/28/2012    Past Medical History:  Diagnosis Date  . Arthritis    Knees; right-TKR, left-bone on bone/end stage  . Asthma   . Carpal tunnel syndrome on both sides 06/21/2010  . Carpal tunnel syndrome, bilateral   . Chronic nonallergic rhinitis    allergy eval NEG 10/2014: azelastine and flonase spray rx'd  . Chronic venous insufficiency    with edema  and extensive varicose dz: Dr. Linus Mako is managing this, planning laser procedures.; A LOT OF LEG CRAMPS AND LEG STIFFNESS  . Complication of anesthesia    STATES SHE WAS TOLD SHE WOKE UP DURING HER KNEE REPLACEMENT SURGERY AND HAD TO BE GIVEN MORE MEDICINE - NOT SURE IF SPINAL OR GENERAL ANESTHESIA  . DDD (degenerative disc disease)    Dr. [name] evaluated her and recommended pain mgmt MD.  She saw Dr. Selinda Orion for pain mgmt at one time.  . Diabetes mellitus without complication (HCC)    Type II  . Disequilibrium syndrome    MRI brain normal 2012  .  DIZZINESS 04/12/2010   Qualifier: Diagnosis of  By: Anitra Lauth M.D., Brien Few   . Fibromyalgia    questionable  . GERD (gastroesophageal reflux disease)   . Heart murmur    functional, w/u done; PALPITATIONS IN THE PAST  . History of bronchitis   . History of palpitations   . History of pulmonary embolism 05/2013   Right sided.  Setting: post-op percutaneous nephrolithotomy--xarelto x 6 mo  .  Hypertension   . Hypothyroidism   . Insomnia   . Insulin resistance   . Kidney stone on right side 12/2014   Dr. Jeffie Pollock considering PCNL, ESWL, and ureteroscopy as of 01/25/15   . Microscopic hematuria 2014   CT abd pelv showed 1.8 cm right renal pelvis stone.  Also had UA c/w UTI so abx given.   . Mixed incontinence urge and stress    and nocturia x 5-6 per night  . Morbid obesity (HCC)    BMI 49.  Gastric stapling surgery in distant past.  . Nephrolithiasis    Lithotripsy 2002.  Right percut nephrolith 05/2013.  Marland Kitchen Nocturia   . Numbness and tingling    bilateral hands and feet  . OAB (overactive bladder)    Per Dr. Jeffie Pollock: pt declined pelvic floor PT.  Vesicare trial started 01/25/15.  . Obesity hypoventilation syndrome ([place])   . Peripheral neuropathy    No old records to confirm pt's report.  Pt refuses to have more NCS b/c test is painful.  Marland Kitchen PONV (postoperative nausea and vomiting)   . Shortness of breath    CHRONIC; 3 mo trial off of ACE-I made no difference.  Spirometry x 2 has shown no obstruction.  Allergy w/u NEG.  . Shoulder pain    HX OF BILATERAL SHOULDER SURGERIES - PT HAS VERY LIMITED ROM - ESPECIALLY RAISING HER ARMS  . Sleep apnea    PT DOES NOT HAVE MASK OR TUBING - JUST SLEEPS WITH HEAD ELEVATED ON 2 PILLOWS   . Spinal stenosis    with L5 radiculopathy, also pseudospondylolisthesis per Dr. [name]  . Superficial thrombophlebitis of left leg 05/2013   with cellulitis--resolved approp with abx and ice  . Urinary incontinence   . Urinary urgency   . Vertigo     Tobacco History: Social History   Tobacco Use  Smoking Status Former Smoker  . Packs/day: 1.00  . Years: 0.00  . Pack years: 0.00  . Types: Cigarettes  . Start date: 05/17/1976  . Quit date: 03/31/1998  . Years since quitting: 22.2  Smokeless Tobacco Never Used   Counseling given: Not Answered   Outpatient Medications Prior to Visit  Medication Sig Dispense Refill  . albuterol (VENTOLIN HFA) 108  (90 Base) MCG/ACT inhaler albuterol sulfate HFA 90 mcg/actuation aerosol inhaler  INL 2 PFS PO Q 4 TO 6 H PRF WHZ OR SOB    . amLODipine (NORVASC) 5 MG tablet Take 5 mg by mouth daily.    . cholecalciferol (VITAMIN D3) 25 MCG (1000 UNIT) tablet Take 1,000 Units by mouth daily.    . Ferrous Sulfate (IRON PO) Take 1 tablet by mouth daily.    Marland Kitchen levothyroxine (SYNTHROID, LEVOTHROID) 200 MCG tablet TAKE ONE TABLET BY MOUTH ONE TIME DAILY before breakfast 30 tablet 11  . losartan (COZAAR) 100 MG tablet Take 100 mg by mouth daily.    . metFORMIN (GLUCOPHAGE) 500 MG tablet Take 1 tablet (500 mg total) by mouth 2 (two) times daily with a meal. (Patient taking differently: Take  500 mg by mouth daily with breakfast.) 60 tablet 11  . montelukast (SINGULAIR) 10 MG tablet Take 10 mg by mouth in the morning.    Marland Kitchen VITAMIN A PO Take 2 capsules by mouth daily.    . vitamin C (ASCORBIC ACID) 500 MG tablet Take 500 mg by mouth daily.    . Vitamin D, Ergocalciferol, (DRISDOL) 1.25 MG (50000 UNIT) CAPS capsule Take 50,000 Units by mouth once a week. Take on Thursdays    . Vitamin E 100 units TABS Take 1 tablet by mouth daily.    . furosemide (LASIX) 20 MG tablet Take 1 tablet (20 mg total) by mouth daily. 30 tablet 1   No facility-administered medications prior to visit.    Review of Systems  Review of Systems  Constitutional: Negative.   HENT: Negative.   Respiratory: Positive for shortness of breath. Negative for cough, chest tightness and wheezing.   Cardiovascular: Positive for leg swelling.   Physical Exam  BP 134/84 (BP Location: Left Arm, Cuff Size: Large)   Pulse 84   Temp (!) 97.2 F (36.2 C)   Wt (!) 377 lb (171 kg)   SpO2 95%   BMI 61.78 kg/m  Physical Exam Constitutional:      Appearance: Normal appearance.  Cardiovascular:     Rate and Rhythm: Normal rate and regular rhythm.     Comments: RRR Pulmonary:     Effort: Pulmonary effort is normal.     Breath sounds: Normal breath  sounds.     Comments: CTA Neurological:     General: No focal deficit present.     Mental Status: She is alert and oriented to person, place, and time. Mental status is at baseline.  Psychiatric:        Mood and Affect: Mood normal.        Behavior: Behavior normal.        Thought Content: Thought content normal.        Judgment: Judgment normal.      Lab Results:  CBC    Component Value Date/Time   WBC 6.4 05/03/2020 0025   RBC 4.45 05/03/2020 0025   HGB 13.3 05/03/2020 0025   HCT 45.8 05/03/2020 0025   PLT 284 05/03/2020 0025   MCV 102.9 (H) 05/03/2020 0025   MCH 29.9 05/03/2020 0025   MCHC 29.0 (L) 05/03/2020 0025   RDW 14.6 05/03/2020 0025   LYMPHSABS 1.3 04/30/2020 0652   MONOABS 0.6 04/30/2020 0652   EOSABS 0.3 04/30/2020 0652   BASOSABS 0.1 04/30/2020 0652    BMET    Component Value Date/Time   NA 139 05/03/2020 0025   K 4.0 05/03/2020 0025   CL 91 (L) 05/03/2020 0025   CO2 36 (H) 05/03/2020 0025   GLUCOSE 110 (H) 05/03/2020 0025   BUN 8 05/03/2020 0025   CREATININE 0.55 05/03/2020 0025   CREATININE 0.70 12/19/2013 1343   CALCIUM 9.0 05/03/2020 0025   GFRNONAA >60 05/03/2020 0025   GFRNONAA >60 06/21/2010 0000   GFRAA >60 03/16/2019 2104   GFRAA >60 06/21/2010 0000    BNP    Component Value Date/Time   BNP 24.1 04/30/2020 0652    ProBNP    Component Value Date/Time   PROBNP 37.0 11/10/2013 1612    Imaging: DG Chest 2 View  Result Date: 06/13/2020 CLINICAL DATA:  Shortness of breath EXAM: CHEST - 2 VIEW COMPARISON:  04/30/2020 FINDINGS: Focal opacity at the right lung base. This likely reflects epicardial fat as seen  anteriorly on the lateral view and on prior chest CT. Heart is borderline in size. Mild peribronchial thickening. No confluent opacity on the left. No effusions or acute bony abnormality. IMPRESSION: Mild bronchitic changes. Density at the right lung base that could reflect epicardial fat rather than true infiltrate. Electronically  Signed   By: Rolm Baptise M.D.   On: 06/13/2020 16:56     Assessment & Plan:   Diastolic dysfunction - Patient reports increased weight and leg swelling  - Plan increase lasix to 40mg  daily (take two 20mg  tablets) - BNP pending - Upcoming apt with cardiology end of April   Acute on chronic respiratory failure with hypoxia and hypercapnia (HCC) - Chronic respiratory failure is due to OSA, obesity and diastolic dysfunction.  - Patient reports compliance with Trilogy ventilator, continue to encourage she wear it every night and during the day with naps - CXR today showed mild bronchitic changes. Density at the right lung base likely reflects epicardial fat as seen anteriorly on the lateral view and on prior chest CT. - Recommend patient get an incentive spirometer and use every hour while awake  - Continues to benefit from supplemental oxygen at 2L/min continuously  - BMET pending   Martyn Ehrich, NP 06/14/2020

## 2020-06-13 NOTE — Assessment & Plan Note (Addendum)
-   Patient reports increased weight and leg swelling  - Plan increase lasix to 40mg  daily (take two 20mg  tablets) - BNP pending - Upcoming apt with cardiology end of April

## 2020-06-13 NOTE — Patient Instructions (Addendum)
Continue to wear Trilogy every night  Increase lasix to 40mg  daily (take two 20mg  tablets) We will call you Friday with CXR and lab results  Look to get an incentive spirometer- recommend you take 10 deep breath every hour while awake   Orders: CXR today Nebulizer machine re: respiratory failure   Follow-up: 4-6 weeks with Dr. Loanne Drilling

## 2020-06-13 NOTE — Assessment & Plan Note (Deleted)
-   Related to OSA/OHS and diastolic dysfunction. Patient reports compliance with Trilogy ventilator, continue to encourage she wear it every night and during the day with naps - Recommend patient get an incentive spirometer and take 10 deep breath every hour while awake  - BMET pending

## 2020-06-13 NOTE — Assessment & Plan Note (Addendum)
-   Chronic respiratory failure is due to OSA, obesity and diastolic dysfunction.  - Patient reports compliance with Trilogy ventilator, continue to encourage she wear it every night and during the day with naps - CXR today showed mild bronchitic changes. Density at the right lung base likely reflects epicardial fat as seen anteriorly on the lateral view and on prior chest CT. - Recommend patient get an incentive spirometer and use every hour while awake  - Continues to benefit from supplemental oxygen at 2L/min continuously  - BMET pending

## 2020-06-14 ENCOUNTER — Telehealth: Payer: Self-pay | Admitting: Primary Care

## 2020-06-14 DIAGNOSIS — R0602 Shortness of breath: Secondary | ICD-10-CM

## 2020-06-14 LAB — BASIC METABOLIC PANEL
BUN: 13 mg/dL (ref 6–23)
CO2: 34 mEq/L — ABNORMAL HIGH (ref 19–32)
Calcium: 9.8 mg/dL (ref 8.4–10.5)
Chloride: 97 mEq/L (ref 96–112)
Creatinine, Ser: 0.67 mg/dL (ref 0.40–1.20)
GFR: 91.47 mL/min (ref >60.00)
Glucose, Bld: 88 mg/dL (ref 70–99)
Potassium: 4.1 mEq/L (ref 3.5–5.1)
Sodium: 140 mEq/L (ref 135–145)

## 2020-06-14 LAB — CBC WITH DIFFERENTIAL/PLATELET
Basophils Absolute: 0 10*3/uL (ref 0.0–0.1)
Basophils Relative: 0.9 % (ref 0.0–3.0)
Eosinophils Absolute: 0.1 10*3/uL (ref 0.0–0.7)
Eosinophils Relative: 2.1 % (ref 0.0–5.0)
HCT: 42.3 % (ref 36.0–46.0)
Hemoglobin: 13.8 g/dL (ref 12.0–15.0)
Lymphocytes Relative: 27.4 % (ref 12.0–46.0)
Lymphs Abs: 1.5 10*3/uL (ref 0.7–4.0)
MCHC: 32.6 g/dL (ref 30.0–36.0)
MCV: 93 fl (ref 78.0–100.0)
Monocytes Absolute: 0.5 10*3/uL (ref 0.1–1.0)
Monocytes Relative: 9.8 % (ref 3.0–12.0)
Neutro Abs: 3.3 10*3/uL (ref 1.4–7.7)
Neutrophils Relative %: 59.8 % (ref 43.0–77.0)
Platelets: 238 10*3/uL (ref 150.0–400.0)
RBC: 4.55 Mil/uL (ref 3.87–5.11)
RDW: 14 % (ref 11.5–15.5)
WBC: 5.5 10*3/uL (ref 4.0–10.5)

## 2020-06-14 LAB — BRAIN NATRIURETIC PEPTIDE: Pro B Natriuretic peptide (BNP): 35 pg/mL (ref 0.0–100.0)

## 2020-06-14 NOTE — Telephone Encounter (Signed)
DME order placed for incentive spirometer.   Patient made aware.   Nothing further needed at this time.

## 2020-06-14 NOTE — Telephone Encounter (Signed)
lmtcb for pt.  

## 2020-06-14 NOTE — Telephone Encounter (Signed)
Called and spoke with pt. Pt was wanting to know the results of labwork and cxr that were done after OV 3/16. Stated to pt as soon as Eustaquio Maize has reviewed test results we would call her to let her know what the results are and she verbalized understanding.

## 2020-06-14 NOTE — Telephone Encounter (Signed)
Called and spoke to pt. Pt states she has a VM of someone saying her CXR results are better than they were before. However, I cannot find any documentation of who called pt regarding her results. Pt is now requesting to know what Tricia Perry says about her CXR results. Pt aware the lab work is not back yet.    Beth, please advise. Thanks!

## 2020-06-14 NOTE — Telephone Encounter (Signed)
Can you put DME order in for IS

## 2020-06-14 NOTE — Telephone Encounter (Signed)
Call made to patient, confirmed DOB. Made aware of CXR results per EW. Voiced understanding. Patient states her daughter was not able to get her a spirometer. Patient requesting recommendations. Patient aware she may not get a response until tomorrow.   EW please advise in regards to spirometer.

## 2020-06-14 NOTE — Telephone Encounter (Signed)
Please let her know it showed mild bronchitic changes. No infiltrate concerning for pneumonia. Overall it looked much improved to me when compared image to previous

## 2020-06-14 NOTE — Telephone Encounter (Signed)
Pt returning a phone call. Pt can be reached at [phone]

## 2020-06-20 ENCOUNTER — Telehealth: Payer: Self-pay | Admitting: Primary Care

## 2020-06-20 NOTE — Telephone Encounter (Addendum)
Order sent to [facility].  Called pt & made her aware.

## 2020-07-13 ENCOUNTER — Ambulatory Visit: Payer: Medicare Other | Admitting: Cardiovascular Disease

## 2020-07-17 ENCOUNTER — Ambulatory Visit: Payer: Medicare Other | Admitting: Pulmonary Disease

## 2020-07-24 ENCOUNTER — Ambulatory Visit: Payer: Medicare Other | Admitting: Interventional Cardiology

## 2020-08-21 ENCOUNTER — Ambulatory Visit: Payer: Medicare Other | Admitting: Interventional Cardiology

## 2020-08-21 ENCOUNTER — Encounter: Payer: Self-pay | Admitting: Interventional Cardiology

## 2020-08-21 ENCOUNTER — Other Ambulatory Visit: Payer: Self-pay

## 2020-08-21 VITALS — BP 150/82 | HR 90 | Ht 64.5 in | Wt 392.0 lb

## 2020-08-21 DIAGNOSIS — I10 Essential (primary) hypertension: Secondary | ICD-10-CM | POA: Diagnosis not present

## 2020-08-21 DIAGNOSIS — I5032 Chronic diastolic (congestive) heart failure: Secondary | ICD-10-CM | POA: Diagnosis not present

## 2020-08-21 DIAGNOSIS — E119 Type 2 diabetes mellitus without complications: Secondary | ICD-10-CM

## 2020-08-21 NOTE — Patient Instructions (Signed)
Medication Instructions:  Your physician recommends that you continue on your current medications as directed. Please refer to the Current Medication list given to you today.  *If you need a refill on your cardiac medications before your next appointment, please call your pharmacy*   Lab Work: none If you have labs (blood work) drawn today and your tests are completely normal, you will receive your results only by: Marland Kitchen MyChart Message (if you have MyChart) OR . A paper copy in the mail If you have any lab test that is abnormal or we need to change your treatment, we will call you to review the results.   Testing/Procedures: none   Follow-Up: At [facility], you and your health needs are our priority.  As part of our continuing mission to provide you with exceptional heart care, we have created designated Provider Care Teams.  These Care Teams include your primary Cardiologist (physician) and Advanced Practice Providers (APPs -  Physician Assistants and Nurse Practitioners) who all work together to provide you with the care you need, when you need it.  We recommend signing up for the patient portal called "MyChart".  Sign up information is provided on this After Visit Summary.  MyChart is used to connect with patients for Virtual Visits (Telemedicine).  Patients are able to view lab/test results, encounter notes, upcoming appointments, etc.  Non-urgent messages can be sent to your provider as well.   To learn more about what you can do with MyChart, go to [email or web address].    Your next appointment:   As needed  The format for your next appointment:   In Person  Provider:   Dr Irish Lack   Other Instructions

## 2020-08-21 NOTE — Progress Notes (Signed)
Cardiology Office Note   Date:  08/21/2020   ID:  Tricia Perry, DOB [birth year], MRN [id number]  PCP:  [name], PA-C    No chief complaint on file.  SHortness of breath  Wt Readings from Last 3 Encounters:  08/21/20 (!) 392 lb (177.8 kg)  06/13/20 (!) 377 lb (171 kg)  05/17/20 278 lb (126.1 kg)       History of Present Illness: Tricia Perry is a 65 y.o. female who is being seen today for the evaluation of shortness of breath at the request of Parrett, Fonnie Mu, NP.  Prior records show: "former smoker quit in 2000.  Past medical history significant for OSA, chronic respiratory failure with hypoxia, hypertension, pulmonary embolism, hypothyroidism, diastolic dysfunction, obesity.  Patient of Dr. Loanne Drilling, last seen by pulmonary nurse practitioner on 05/17/2020 for shortness of breath.   06/13/2020 Patient presents today for 1 month follow-up.  During last visit she was asked to restart Lasix 20 mg daily and to follow a low-salt diet. Patient has had recurrent hospitalizations due to acute on chronic hypoxic and hypercarbic respiratory failure secondary to underlying OSA/OHS.  Patient is maintained on trilogy noninvasive ventilator at night.  She has been told to wear ventilator all night long whilst she is sleeping and also with naps during the day.  Chest x-ray on 04/30/2020 showed patchy right lower lobe opacity, atelectasis versus pneumonia, possible superimposed right pleural effusion.  Pulmonary vascular congestion.  Mild interstitial edema. Patient wears 2 L of oxygen continuously.   She is compliant with Trilogy ventilator every night. She does not use it during the day. She think she is holding onto a lot of fluid. She has been taking lasix 20mg  daily. Lymphedema right lower extremity. Her weight today is 377 (last visit documented 278lb but patient states that this was likely inaccurate as she has not been <300lbs in some time)."  She has had multiple BNP  readings in 2022, all less than 40.  She had negative troponin in January 2022.  Lasix has been titrated.  She has problems with lymphedema, affecting her right leg and stomach.   Jan 2022 echo showed: "Left ventricular ejection fraction, by estimation, is 60 to 65%. The  left ventricle has normal function. Left ventricular endocardial border  not optimally defined to evaluate regional wall motion. Left ventricular  diastolic parameters were normal.  2. Right ventricular systolic function was not well visualized. The right  ventricular size is normal.  3. The mitral valve was not well visualized. No evidence of mitral valve  regurgitation. No evidence of mitral stenosis.  4. The aortic valve was not well visualized. Aortic valve regurgitation  is not visualized. No aortic stenosis is present.  5. The inferior vena cava is normal in size with greater than 50%  respiratory variability, suggesting right atrial pressure of 3 mmHg."  Denies : Chest pain. Dizziness. Leg edema. Nitroglycerin use. Palpitations.  Syncope.   Past Medical History:  Diagnosis Date  . Arthritis    Knees; right-TKR, left-bone on bone/end stage  . Asthma   . Carpal tunnel syndrome on both sides 06/21/2010  . Carpal tunnel syndrome, bilateral   . Chronic nonallergic rhinitis    allergy eval NEG 10/2014: azelastine and flonase spray rx'd  . Chronic venous insufficiency    with edema  and extensive varicose dz: Dr. Linus Mako is managing this, planning laser procedures.; A LOT OF LEG CRAMPS AND LEG STIFFNESS  . Complication of  anesthesia    STATES SHE WAS TOLD SHE WOKE UP DURING HER KNEE REPLACEMENT SURGERY AND HAD TO BE GIVEN MORE MEDICINE - NOT SURE IF SPINAL OR GENERAL ANESTHESIA  . DDD (degenerative disc disease)    Dr. [name] evaluated her and recommended pain mgmt MD.  She saw Dr. Selinda Orion for pain mgmt at one time.  . Diabetes mellitus without complication (HCC)    Type II  . Disequilibrium syndrome     MRI brain normal 2012  . DIZZINESS 04/12/2010   Qualifier: Diagnosis of  By: Anitra Lauth M.D., Brien Few   . Fibromyalgia    questionable  . GERD (gastroesophageal reflux disease)   . Heart murmur    functional, w/u done; PALPITATIONS IN THE PAST  . History of bronchitis   . History of palpitations   . History of pulmonary embolism 05/2013   Right sided.  Setting: post-op percutaneous nephrolithotomy--xarelto x 6 mo  . Hypertension   . Hypothyroidism   . Insomnia   . Insulin resistance   . Kidney stone on right side 12/2014   Dr. Jeffie Pollock considering PCNL, ESWL, and ureteroscopy as of 01/25/15   . Microscopic hematuria 2014   CT abd pelv showed 1.8 cm right renal pelvis stone.  Also had UA c/w UTI so abx given.   . Mixed incontinence urge and stress    and nocturia x 5-6 per night  . Morbid obesity (HCC)    BMI 49.  Gastric stapling surgery in distant past.  . Nephrolithiasis    Lithotripsy 2002.  Right percut nephrolith 05/2013.  Marland Kitchen Nocturia   . Numbness and tingling    bilateral hands and feet  . OAB (overactive bladder)    Per Dr. Jeffie Pollock: pt declined pelvic floor PT.  Vesicare trial started 01/25/15.  . Obesity hypoventilation syndrome ([place])   . Peripheral neuropathy    No old records to confirm pt's report.  Pt refuses to have more NCS b/c test is painful.  Marland Kitchen PONV (postoperative nausea and vomiting)   . Shortness of breath    CHRONIC; 3 mo trial off of ACE-I made no difference.  Spirometry x 2 has shown no obstruction.  Allergy w/u NEG.  . Shoulder pain    HX OF BILATERAL SHOULDER SURGERIES - PT HAS VERY LIMITED ROM - ESPECIALLY RAISING HER ARMS  . Sleep apnea    PT DOES NOT HAVE MASK OR TUBING - JUST SLEEPS WITH HEAD ELEVATED ON 2 PILLOWS   . Spinal stenosis    with L5 radiculopathy, also pseudospondylolisthesis per Dr. [name]  . Superficial thrombophlebitis of left leg 05/2013   with cellulitis--resolved approp with abx and ice  . Urinary incontinence   . Urinary urgency   .  Vertigo     Past Surgical History:  Procedure Laterality Date  . BILATERAL SHOULDER SURGERY    . BREAST EXCISIONAL BIOPSY Left 11/12/2015  . BREAST LUMPECTOMY WITH RADIOACTIVE SEED LOCALIZATION Left 11/12/2015   Procedure: LEFT BREAST LUMPECTOMY WITH RADIOACTIVE SEED LOCALIZATION;  Surgeon: Autumn Messing III, MD;  Location: [facility];  Service: General;  Laterality: Left;  . CHOLECYSTECTOMY  1987  . ENDOMETRIAL ABLATION     menorrhagia  . EXTRACORPOREAL SHOCK WAVE LITHOTRIPSY    . FOOT SURGERY  2000   Right tarsel tunnel release  . GASTRIC RESTRICTION SURGERY  remote past  . HERNIA REPAIR   1980's   Diaphragmatic + gastric stapling  . HERNIA REPAIR     Inguinal  . JOINT REPLACEMENT  2009   Right Knee, [facility]  . NEPHROLITHOTOMY Right 05/31/2013   Procedure: NEPHROLITHOTOMY PERCUTANEOUS;  Surgeon: Irine Seal, MD;  Location: [facility];  Service: Urology;  Laterality: Right;  . PFT's  09/2013; 12/2013   09/2013 mild restriction.  12/2013 Low vital capacity, possibly due to restriction from pt's body habitus.  . TRANSTHORACIC ECHOCARDIOGRAM  12/16/13   EF 46-27%, grade 2 diastolic dysfxn, PA pressure 37 (mildly high)     Current Outpatient Medications  Medication Sig Dispense Refill  . albuterol (PROVENTIL) (2.5 MG/3ML) 0.083% nebulizer solution Take 3 mLs (2.5 mg total) by nebulization every 6 (six) hours as needed for wheezing or shortness of breath. 75 mL 12  . albuterol (VENTOLIN HFA) 108 (90 Base) MCG/ACT inhaler albuterol sulfate HFA 90 mcg/actuation aerosol inhaler  INL 2 PFS PO Q 4 TO 6 H PRF WHZ OR SOB    . amLODipine (NORVASC) 5 MG tablet Take 5 mg by mouth daily.    . cholecalciferol (VITAMIN D3) 25 MCG (1000 UNIT) tablet Take 1,000 Units by mouth daily.    . Ferrous Sulfate (IRON PO) Take 1 tablet by mouth daily.    . furosemide (LASIX) 20 MG tablet Take 1 tablet (20 mg total) by mouth daily. 30 tablet 3  . levothyroxine (SYNTHROID, LEVOTHROID) 200 MCG tablet TAKE ONE TABLET BY  MOUTH ONE TIME DAILY before breakfast 30 tablet 11  . losartan (COZAAR) 100 MG tablet Take 100 mg by mouth daily.    . metFORMIN (GLUCOPHAGE) 500 MG tablet Take 1 tablet (500 mg total) by mouth 2 (two) times daily with a meal. (Patient taking differently: Take 500 mg by mouth daily with breakfast.) 60 tablet 11  . montelukast (SINGULAIR) 10 MG tablet Take 10 mg by mouth in the morning.    Marland Kitchen VITAMIN A PO Take 2 capsules by mouth daily.    . vitamin C (ASCORBIC ACID) 500 MG tablet Take 500 mg by mouth daily.    . Vitamin D, Ergocalciferol, (DRISDOL) 1.25 MG (50000 UNIT) CAPS capsule Take 50,000 Units by mouth once a week. Take on Thursdays    . Vitamin E 100 units TABS Take 1 tablet by mouth daily.     No current facility-administered medications for this visit.    Allergies:   Gabapentin, Mold extract [trichophyton], Morphine, and Codeine    Social History:  The patient  reports that she quit smoking about 22 years ago. Her smoking use included cigarettes. She started smoking about 44 years ago. She smoked 1.00 pack per day for 0.00 years. She has never used smokeless tobacco. She reports current alcohol use. She reports that she does not use drugs.   Family History:  The patient's family history includes ADD / ADHD in her daughter; Alcohol abuse in her paternal grandfather; Breast cancer in her maternal grandmother, paternal aunt, and paternal aunt; Cancer in her mother; Diabetes in her mother; Heart attack in her maternal grandfather; Hypertension in her brother, father, and mother; Peripheral vascular disease in her brother; Stroke in her father; Thyroid disease in her brother; Varicose Veins in her brother.    ROS:  Please see the history of present illness.   Otherwise, review of systems are positive for shortness of breath.   All other systems are reviewed and negative.    PHYSICAL EXAM: VS:  Ht 5' 4.5" (1.638 m)   Wt (!) 392 lb (177.8 kg)   BMI 66.25 kg/m  , BMI Body mass index is  66.25 kg/m.  GEN: Well nourished, well developed, in no acute distress  HEENT: normal  Neck: no JVD, carotid bruits, or masses Cardiac: RRR; no murmurs, rubs, or gallops,no edema  Respiratory:  clear to auscultation bilaterally, normal work of breathing GI: soft, nontender, nondistended, + BS MS: no deformity or atrophy  Skin: warm and dry, no rash Neuro:  Strength and sensation are intact Psych: euthymic mood, full affect   EKG:   The ekg ordered today demonstrates NSR, left axis deviation   Recent Labs: 04/30/2020: B Natriuretic Peptide 24.1 05/01/2020: ALT 23 06/13/2020: BUN 13; Creatinine, Ser 0.67; Hemoglobin 13.8; Platelets 238.0; Potassium 4.1; Pro B Natriuretic peptide (BNP) 35.0; Sodium 140   Lipid Panel    Component Value Date/Time   CHOL 189 12/02/2013 1355   TRIG 112.0 12/02/2013 1355   HDL 72.20 12/02/2013 1355   CHOLHDL 3 12/02/2013 1355   VLDL 22.4 12/02/2013 1355   LDLCALC 94 12/02/2013 1355   LDLDIRECT 104.9 04/17/2010 1303     Other studies Reviewed: Additional studies/ records that were reviewed today with results demonstrating: labs and echo reviewed.   ASSESSMENT AND PLAN:  1. Chronic diastolic heart failure: Normal BNP and LVEF.  She reports dark urine despite taking furosemide.  Diuretic being titrated.  BNP has been normal.  EF is normal.  COuld try torsemide 20 mg daily if she does not respond well to furosemide.  2. HTN:  Does not check BP at home often.  Low salt. Continue amlodipine.  Could also intensify diuretic therapy as noted above.  Renal function is stable. 3. DM: A1C 6.3 in 2022.  We discussed the challenges of her increasing exercise due to her obesity. 4. Morbid obesity:  THis is her most significant issue.  She has difficulty exercising.  She wants to try water exercise.  Worsened by significant lymphedema as well.  She has devices to try to help with the lymphedema.  At this point, no further cardiac testing needed.   Current medicines  are reviewed at length with the patient today.  The patient concerns regarding her medicines were addressed.  The following changes have been made:  No change  Labs/ tests ordered today include:  No orders of the defined types were placed in this encounter.   Recommend 150 minutes/week of aerobic exercise Low fat, low carb, high fiber diet recommended  Disposition:   FU as needed   Signed, Larae Grooms, MD  08/21/2020 3:22 PM    [facility] [address], [place], [place]  [address] Phone: [phone]; Fax: [phone]

## 2020-08-22 ENCOUNTER — Telehealth: Payer: Self-pay | Admitting: Pulmonary Disease

## 2020-08-22 NOTE — Telephone Encounter (Signed)
Spoke with pt who stated [employer] called her last night and stated the order form her O2 had expired May 2nd. Pt stated she was given O2 in hospital in beginning of Feb. 2022.  Attempted to call [employer] and address situation but had to leave message for return call from their office.

## 2020-08-29 ENCOUNTER — Encounter: Payer: Self-pay | Admitting: Pulmonary Disease

## 2020-08-29 ENCOUNTER — Other Ambulatory Visit: Payer: Self-pay

## 2020-08-29 ENCOUNTER — Ambulatory Visit: Payer: Medicare Other | Admitting: Pulmonary Disease

## 2020-08-29 VITALS — BP 124/76 | HR 100 | Temp 97.0°F | Ht 64.5 in | Wt 374.2 lb

## 2020-08-29 DIAGNOSIS — J9611 Chronic respiratory failure with hypoxia: Secondary | ICD-10-CM | POA: Diagnosis not present

## 2020-08-29 DIAGNOSIS — J9612 Chronic respiratory failure with hypercapnia: Secondary | ICD-10-CM | POA: Diagnosis not present

## 2020-08-29 DIAGNOSIS — I5032 Chronic diastolic (congestive) heart failure: Secondary | ICD-10-CM | POA: Diagnosis not present

## 2020-08-29 NOTE — Progress Notes (Deleted)
@Patient  ID: Tricia Perry, female    DOB: 1955-06-28, 65 y.o.   MRN: [id number]  Chief Complaint  Patient presents with   Follow-up    Wants to start working on getting off oxygen.     Referring provider: Dayton Scrape*  HPI: [name] is a 65 year old female former smoker with OSA, chronic hypoxemic respiratory failure with hypoxemia and hypercarbia and chronic diastolic heart failure who presents for follow-up.  She is compliant with her home oxygen 2L via [place]. Trilogy ventilator with 2.5L supplemental oxygen for 4-5 hours night. She is short of breath with exertion. Denies wheezing and cough. She has difficulty ambulating due to heat, joint pain, lymphedema. She uses albuterol nebulizer daily.  She was last seen by NP Volanda Napoleon in 05/2020 and increased to lasix 40 mg daily. Her edema improved after a few days so she returned to 20 mg daily.   Outpatient Medications Prior to Visit  Medication Sig Dispense Refill   acetaminophen (TYLENOL) 325 MG tablet Take 650 mg by mouth every 6 (six) hours as needed for mild pain.     albuterol (PROVENTIL) (2.5 MG/3ML) 0.083% nebulizer solution Take 3 mLs (2.5 mg total) by nebulization every 6 (six) hours as needed for wheezing or shortness of breath. 75 mL 12   albuterol (VENTOLIN HFA) 108 (90 Base) MCG/ACT inhaler albuterol sulfate HFA 90 mcg/actuation aerosol inhaler  INL 2 PFS PO Q 4 TO 6 H PRF WHZ OR SOB     amLODipine (NORVASC) 5 MG tablet Take 5 mg by mouth daily.     aspirin EC 81 MG tablet Take 81 mg by mouth daily. Swallow whole.     cholecalciferol (VITAMIN D3) 25 MCG (1000 UNIT) tablet Take 1,000 Units by mouth daily.     Ferrous Sulfate (IRON PO) Take 1 tablet by mouth daily.     furosemide (LASIX) 20 MG tablet Take 1 tablet (20 mg total) by mouth daily. 30 tablet 3   levothyroxine (SYNTHROID, LEVOTHROID) 200 MCG tablet TAKE ONE TABLET BY MOUTH ONE TIME DAILY before breakfast 30 tablet 11   losartan (COZAAR) 100 MG tablet Take  100 mg by mouth daily.     metFORMIN (GLUCOPHAGE) 500 MG tablet Take 1 tablet (500 mg total) by mouth 2 (two) times daily with a meal. (Patient taking differently: Take 500 mg by mouth daily with breakfast.) 60 tablet 11   montelukast (SINGULAIR) 10 MG tablet Take 10 mg by mouth in the morning.     OXYGEN Inhale 2.5 L into the lungs continuous.     VITAMIN A PO Take 2 capsules by mouth daily.     vitamin C (ASCORBIC ACID) 500 MG tablet Take 500 mg by mouth daily.     Vitamin D, Ergocalciferol, (DRISDOL) 1.25 MG (50000 UNIT) CAPS capsule Take 50,000 Units by mouth once a week. Take on Thursdays     No facility-administered medications prior to visit.   Physical Exam: General: Morbidly obese, well-appearing, no acute distress HENT: [place], AT Eyes: EOMI, no scleral icterus Respiratory: Clear to auscultation bilaterally.  No crackles, wheezing or rales Cardiovascular: RRR, -M/R/G, no JVD Extremities:-Edema,-tenderness Neuro: AAO x4, CNII-XII grossly intact  Lab Results:  CBC    Component Value Date/Time   WBC 5.5 06/13/2020 1535   RBC 4.55 06/13/2020 1535   HGB 13.8 06/13/2020 1535   HCT 42.3 06/13/2020 1535   PLT 238.0 06/13/2020 1535   MCV 93.0 06/13/2020 1535   MCH 29.9 05/03/2020  0025   MCHC 32.6 06/13/2020 1535   RDW 14.0 06/13/2020 1535   LYMPHSABS 1.5 06/13/2020 1535   MONOABS 0.5 06/13/2020 1535   EOSABS 0.1 06/13/2020 1535   BASOSABS 0.0 06/13/2020 1535    BMET    Component Value Date/Time   NA 140 06/13/2020 1535   K 4.1 06/13/2020 1535   CL 97 06/13/2020 1535   CO2 34 (H) 06/13/2020 1535   GLUCOSE 88 06/13/2020 1535   BUN 13 06/13/2020 1535   CREATININE 0.67 06/13/2020 1535   CREATININE 0.70 12/19/2013 1343   CALCIUM 9.8 06/13/2020 1535   GFRNONAA >60 05/03/2020 0025   GFRNONAA >60 06/21/2010 0000   GFRAA >60 03/16/2019 2104   GFRAA >60 06/21/2010 0000   Imaging: CXR 06/13/20 - Mild peribronchial thickening. No infiltrate effusion or edema  Imaging, labs  and test noted above have been reviewed independently by me.   Assessment & Plan:   No problem-specific Assessment & Plan notes found for this encounter.  Chronic hypoxemic and hypercarbic respiratory failure --Obtain ventilator compliance report for review --Continue supplemental oxygen during the night --Wear oxygen during the day when SpO2 levels <90% --Refer to Pulmonary Rehab  Morbid obesity --Refer to Bariatric Surgery  Chronic diastolic heart failure --Continue lasix 20 mg daily PRN --Obtain BMET today  Health Maintenance Immunization History  Administered Date(s) Administered   Influenza Split 03/31/2008, 12/30/2010   Influenza Whole 05/10/2010   Influenza,inj,Quad PF,6+ Mos 05/18/2014   Influenza,inj,Quad PF,6-35 Mos 01/14/2016, 12/03/2016   Moderna SARS-COV2 Booster Vaccination 03/27/2020   Moderna Sars-Covid-2 Vaccination 05/13/2019, 06/10/2019   Pneumococcal Polysaccharide-23 10/28/2012   Td 03/31/2001   Tdap 04/01/2003, 10/28/2012   No orders of the defined types were placed in this encounter. Orders Placed This Encounter  Procedures   Basic metabolic panel    Standing Status:   Future    Number of Occurrences:   1    Standing Expiration Date:   08/29/2021   Amb Referral to Bariatric Surgery    Referral Priority:   Routine    Referral Type:   Consultation    Number of Visits Requested:   1   Ambulatory Referral for DME    Referral Priority:   Routine    Referral Type:   Durable Medical Equipment Purchase    Number of Visits Requested:   1   AMB referral to pulmonary rehabilitation    Referral Priority:   Routine    Referral Type:   Consultation    Number of Visits Requested:   1     I have spent a total time of 31-minutes on the day of the appointment reviewing prior documentation, coordinating care and discussing medical diagnosis and plan with the patient/family. Imaging, labs and tests included in this note have been reviewed and interpreted  independently by me.  Rodman Pickle, M.D. [facility] Pulmonary/Critical Care Medicine 09/18/2020 7:24 PM

## 2020-08-29 NOTE — Patient Instructions (Addendum)
Chronic hypoxemic and hypercarbic respiratory failure --Obtain ventilator compliance report for review --Continue supplemental oxygen during the night --Wear oxygen during the day when SpO2 levels <90% --Refer to Pulmonary Rehab  Morbid obesity --Refer to Bariatric Surgery  Chronic diastolic heart failure --Continue lasix 20 mg daily PRN --Obtain BMET today  Follow-up with me in 3 months

## 2020-08-29 NOTE — Progress Notes (Addendum)
@Patient  ID: Tricia Perry, female    DOB: 26-Aug-1955, 65 y.o.   MRN: [id number]  Chief Complaint  Patient presents with   Follow-up    Wants to start working on getting off oxygen.    [name] is a 65 year old female with morbid obesity, chronic diastolic heart failure who presents for follow-up for chronic hypoxemic and hypercarbic respiratory failure.  She is compliant with her home oxygen 2L via [place]. Trilogy ventilator with 2.5L supplemental oxygen for 4-5 hours night. She is short of breath with exertion. Denies wheezing and cough. She has difficulty ambulating due to heat, joint pain, lymphedema. She uses albuterol nebulizer daily.  She was last seen by NP Volanda Napoleon in 05/2020 and increased to lasix 40 mg daily. Her edema improved after a few days so she returned to 20 mg daily.  Past Medical History:  Diagnosis Date   Arthritis    Knees; right-TKR, left-bone on bone/end stage   Asthma    Carpal tunnel syndrome on both sides 06/21/2010   Carpal tunnel syndrome, bilateral    Chronic nonallergic rhinitis    allergy eval NEG 10/2014: azelastine and flonase spray rx'd   Chronic venous insufficiency    with edema  and extensive varicose dz: Dr. Linus Mako is managing this, planning laser procedures.; A LOT OF LEG CRAMPS AND LEG STIFFNESS   Complication of anesthesia    STATES SHE WAS TOLD SHE WOKE UP DURING HER KNEE REPLACEMENT SURGERY AND HAD TO BE GIVEN MORE MEDICINE - NOT SURE IF SPINAL OR GENERAL ANESTHESIA   DDD (degenerative disc disease)    Dr. [name] evaluated her and recommended pain mgmt MD.  She saw Dr. Selinda Orion for pain mgmt at one time.   Diabetes mellitus without complication ([place])    Type II   Disequilibrium syndrome    MRI brain normal 2012   DIZZINESS 04/12/2010   Qualifier: Diagnosis of  By: Anitra Lauth M.D., Brien Few    Fibromyalgia    questionable   GERD (gastroesophageal reflux disease)    Heart murmur    functional, w/u done; PALPITATIONS IN THE PAST    History of bronchitis    History of palpitations    History of pulmonary embolism 05/2013   Right sided.  Setting: post-op percutaneous nephrolithotomy--xarelto x 6 mo   Hypertension    Hypothyroidism    Insomnia    Insulin resistance    Kidney stone on right side 12/2014   Dr. Jeffie Pollock considering PCNL, ESWL, and ureteroscopy as of 01/25/15    Microscopic hematuria 2014   CT abd pelv showed 1.8 cm right renal pelvis stone.  Also had UA c/w UTI so abx given.    Mixed incontinence urge and stress    and nocturia x 5-6 per night   Morbid obesity (HCC)    BMI 49.  Gastric stapling surgery in distant past.   Nephrolithiasis    Lithotripsy 2002.  Right percut nephrolith 05/2013.   Nocturia    Numbness and tingling    bilateral hands and feet   OAB (overactive bladder)    Per Dr. Jeffie Pollock: pt declined pelvic floor PT.  Vesicare trial started 01/25/15.   Obesity hypoventilation syndrome (HCC)    Peripheral neuropathy    No old records to confirm pt's report.  Pt refuses to have more NCS b/c test is painful.   PONV (postoperative nausea and vomiting)    Shortness of breath    CHRONIC; 3 mo trial off of  ACE-I made no difference.  Spirometry x 2 has shown no obstruction.  Allergy w/u NEG.   Shoulder pain    HX OF BILATERAL SHOULDER SURGERIES - PT HAS VERY LIMITED ROM - ESPECIALLY RAISING HER ARMS   Sleep apnea    PT DOES NOT HAVE MASK OR TUBING - JUST SLEEPS WITH HEAD ELEVATED ON 2 PILLOWS    Spinal stenosis    with L5 radiculopathy, also pseudospondylolisthesis per Dr. [name]   Superficial thrombophlebitis of left leg 05/2013   with cellulitis--resolved approp with abx and ice   Urinary incontinence    Urinary urgency    Vertigo     Outpatient Medications Prior to Visit  Medication Sig Dispense Refill   acetaminophen (TYLENOL) 325 MG tablet Take 650 mg by mouth every 6 (six) hours as needed for mild pain.     albuterol (PROVENTIL) (2.5 MG/3ML) 0.083% nebulizer solution Take 3 mLs (2.5 mg  total) by nebulization every 6 (six) hours as needed for wheezing or shortness of breath. 75 mL 12   albuterol (VENTOLIN HFA) 108 (90 Base) MCG/ACT inhaler albuterol sulfate HFA 90 mcg/actuation aerosol inhaler  INL 2 PFS PO Q 4 TO 6 H PRF WHZ OR SOB     amLODipine (NORVASC) 5 MG tablet Take 5 mg by mouth daily.     aspirin EC 81 MG tablet Take 81 mg by mouth daily. Swallow whole.     cholecalciferol (VITAMIN D3) 25 MCG (1000 UNIT) tablet Take 1,000 Units by mouth daily.     Ferrous Sulfate (IRON PO) Take 1 tablet by mouth daily.     furosemide (LASIX) 20 MG tablet Take 1 tablet (20 mg total) by mouth daily. 30 tablet 3   levothyroxine (SYNTHROID, LEVOTHROID) 200 MCG tablet TAKE ONE TABLET BY MOUTH ONE TIME DAILY before breakfast 30 tablet 11   losartan (COZAAR) 100 MG tablet Take 100 mg by mouth daily.     metFORMIN (GLUCOPHAGE) 500 MG tablet Take 1 tablet (500 mg total) by mouth 2 (two) times daily with a meal. (Patient taking differently: Take 500 mg by mouth daily with breakfast.) 60 tablet 11   montelukast (SINGULAIR) 10 MG tablet Take 10 mg by mouth in the morning.     OXYGEN Inhale 2.5 L into the lungs continuous.     VITAMIN A PO Take 2 capsules by mouth daily.     vitamin C (ASCORBIC ACID) 500 MG tablet Take 500 mg by mouth daily.     Vitamin D, Ergocalciferol, (DRISDOL) 1.25 MG (50000 UNIT) CAPS capsule Take 50,000 Units by mouth once a week. Take on Thursdays     No facility-administered medications prior to visit.    Review of Systems  Review of Systems  Constitutional:  Negative for chills, diaphoresis, fever, malaise/fatigue and weight loss.  HENT:  Negative for congestion.   Respiratory:  Positive for shortness of breath. Negative for cough, hemoptysis, sputum production and wheezing.   Cardiovascular:  Negative for chest pain, palpitations and leg swelling.    Physical Exam  BP 124/76 (BP Location: Right Arm, Cuff Size: Normal)   Pulse 100   Temp (!) 97 F (36.1 C)  (Temporal)   Ht 5' 4.5" (1.638 m)   Wt (!) 374 lb 3.2 oz (169.7 kg)   SpO2 94% Comment: 2L  BMI 63.24 kg/m   Body mass index is 63.24 kg/m.  Physical Exam: General: Well-appearing, no acute distress HENT: [place], AT Eyes: EOMI, no scleral icterus Respiratory: Clear to auscultation bilaterally.  No crackles, wheezing or rales Cardiovascular: RRR, -M/R/G, no JVD Extremities:-Edema,-tenderness Neuro: AAO x4, CNII-XII grossly intact Skin: Intact, no rashes or bruising Psych: Normal mood, normal affect   Lab Results:  CBC    Component Value Date/Time   WBC 5.5 06/13/2020 1535   RBC 4.55 06/13/2020 1535   HGB 13.8 06/13/2020 1535   HCT 42.3 06/13/2020 1535   PLT 238.0 06/13/2020 1535   MCV 93.0 06/13/2020 1535   MCH 29.9 05/03/2020 0025   MCHC 32.6 06/13/2020 1535   RDW 14.0 06/13/2020 1535   LYMPHSABS 1.5 06/13/2020 1535   MONOABS 0.5 06/13/2020 1535   EOSABS 0.1 06/13/2020 1535   BASOSABS 0.0 06/13/2020 1535    BMET    Component Value Date/Time   NA 140 06/13/2020 1535   K 4.1 06/13/2020 1535   CL 97 06/13/2020 1535   CO2 34 (H) 06/13/2020 1535   GLUCOSE 88 06/13/2020 1535   BUN 13 06/13/2020 1535   CREATININE 0.67 06/13/2020 1535   CREATININE 0.70 12/19/2013 1343   CALCIUM 9.8 06/13/2020 1535   GFRNONAA >60 05/03/2020 0025   GFRNONAA >60 06/21/2010 0000   GFRAA >60 03/16/2019 2104   GFRAA >60 06/21/2010 0000    Assessment & Plan:  65 year old female with morbid obesity (BMI 63) with chronic respiratory failure secondary to OSA/OHS who presents for follow-up.  Chronic hypoxemic and hypercarbic respiratory failure secondary to OSA/OHS --Continue supplemental oxygen during the day  --Continue Trilogy ventilator nightly --Obtain Trilogy ventilator compliance report for review ADDENDUM: Ventilator compliance report from March 27-May 26 reviewed. Patient uses ventilator 5 hours nightly on average with >70% utilization during this time interval. Will send note to  her DME  Asthma --PRN albuterol --Continue singulair 10 mg daily  Morbid obesity --Refer to Bariatric Surgery  Chronic diastolic heart failure --Continue lasix 20 mg daily PRN --Obtain BMET today  Immunization History  Administered Date(s) Administered   Influenza Split 03/31/2008, 12/30/2010   Influenza Whole 05/10/2010   Influenza,inj,Quad PF,6+ Mos 05/18/2014   Influenza,inj,Quad PF,6-35 Mos 01/14/2016, 12/03/2016   Moderna SARS-COV2 Booster Vaccination 03/27/2020   Moderna Sars-Covid-2 Vaccination 05/13/2019, 06/10/2019   Pneumococcal Polysaccharide-23 10/28/2012   Td 03/31/2001   Tdap 04/01/2003, 10/28/2012   No orders of the defined types were placed in this encounter.  Orders Placed This Encounter  Procedures   Basic metabolic panel    Standing Status:   Future    Number of Occurrences:   1    Standing Expiration Date:   08/29/2021   Amb Referral to Bariatric Surgery    Referral Priority:   Routine    Referral Type:   Consultation    Number of Visits Requested:   1   Ambulatory Referral for DME    Referral Priority:   Routine    Referral Type:   Durable Medical Equipment Purchase    Number of Visits Requested:   1   AMB referral to pulmonary rehabilitation    Referral Priority:   Routine    Referral Type:   Consultation    Number of Visits Requested:   1   No follow-ups on file.  I have spent a total time of 35-minutes on the day of the appointment reviewing prior documentation, coordinating care and discussing medical diagnosis and plan with the patient/family. Imaging, labs and tests included in this note have been reviewed and interpreted independently by me.   Rodman Pickle, M.D. [facility] Pulmonary[facility]Critical [facility] Medicine 10/10/2020 4:54 PM

## 2020-08-31 LAB — BASIC METABOLIC PANEL
BUN: 17 mg/dL (ref 6–23)
CO2: 29 mEq/L (ref 19–32)
Calcium: 10.1 mg/dL (ref 8.4–10.5)
Chloride: 99 mEq/L (ref 96–112)
Creatinine, Ser: 0.6 mg/dL (ref 0.40–1.20)
GFR: 93.79 mL/min (ref >60.00)
Glucose, Bld: 98 mg/dL (ref 70–99)
Potassium: 4.4 mEq/L (ref 3.5–5.1)
Sodium: 139 mEq/L (ref 135–145)

## 2020-09-03 ENCOUNTER — Encounter (HOSPITAL_COMMUNITY): Payer: Self-pay | Admitting: *Deleted

## 2020-09-03 ENCOUNTER — Telehealth: Payer: Self-pay | Admitting: Pulmonary Disease

## 2020-09-03 NOTE — Telephone Encounter (Signed)
I have called patient and left brief VM. Normal kidney function and electrolytes. No changes to current medication.

## 2020-09-03 NOTE — Progress Notes (Signed)
Received referral from Dr. Arnoldo Lenis for this pt to participate in pulmonary rehab with the the diagnosis of Chronic diastolic heart failure and Chronic Respiratory Failure with hypoxia and hypercapnia . Clinical review of pt follow up appt on 08/29/20  Pulmonary office note.  Pt with Covid Risk Score - 6. Pt appropriate for scheduling for Pulmonary rehab if she feels she can exercise in a group setting.  There was a mention of water exercise but with the oxygen that she needs continuously this can be difficult to manage if even possible. Often pt who are are oxygen therapy are unable to participate in water exercises. Will forward to support staff for scheduling when able due to the long wait list and verification of insurance eligibility/benefits with pt consent. Cherre Huger, BSN Cardiac and Training and development officer

## 2020-09-03 NOTE — Telephone Encounter (Signed)
JE pt is calling about her lab results from 06/01.  Please advise. Thanks

## 2020-09-05 NOTE — Telephone Encounter (Signed)
Pt seen by Dr. Loanne Drilling on 08/29/20 and was advised pt to continue O2. Will close encounter.

## 2020-09-11 ENCOUNTER — Other Ambulatory Visit: Payer: Self-pay | Admitting: Adult Health

## 2020-09-17 ENCOUNTER — Telehealth: Payer: Self-pay | Admitting: Pulmonary Disease

## 2020-09-17 NOTE — Telephone Encounter (Signed)
Lattie Haw from [facility] stated that the pt called in regards to being seen by a physician at their office due to the blockage in her colon so she can have bariatric surgery and the pt informed Lattie Haw that Dr. Loanne Drilling was going to send over a referral to their office and they have not received anything and wanted to just verify with Dr. Loanne Drilling and receive any information needed to see the pt. Pls regard; [phone] (Lisa's Number)

## 2020-09-18 DIAGNOSIS — I5032 Chronic diastolic (congestive) heart failure: Secondary | ICD-10-CM | POA: Insufficient documentation

## 2020-09-18 NOTE — Telephone Encounter (Signed)
Received call from [facility].  Lattie Haw stated Patient is calling because she said Dr. Loanne Drilling had referred her for bariatric surgery, but Patient stated to [facility], that she needed to have a blocked colon repaired first.  Lattie Haw stated she cannot find anything in Patient's chart suggesting colon repair needed.  Lattie Haw stated she cannot schedule Patient with a surgeon for a colon repair without reason. Lattie Haw is wanting to know if Dr. Loanne Drilling is aware of colon blockage? Lattie Haw stated to call back to [facility], her direct number was [phone].   Message routed to Dr. Loanne Drilling to advise

## 2020-09-18 NOTE — Telephone Encounter (Signed)
I am unaware of patients need for colon repair. She will need to discuss this with her PCP or surgeon who recommended this procedure. Referral was only for bariatric surgery for morbid obesity. Please contact patient and her PCP regarding this barrier.

## 2020-09-18 NOTE — Telephone Encounter (Signed)
ATC LVMTCB x 1  

## 2020-09-19 NOTE — Telephone Encounter (Signed)
Lattie Haw called and she is aware of the note that Cherina left in her chart.  Lattie Haw is aware of these and she will wait until the pt has everything taken care of with her PCP or GI.

## 2020-09-19 NOTE — Telephone Encounter (Signed)
Called and spoke with patient at length. She verbalized understanding. She did ask her if she had a GI doctor to help with her colon concerns. She stated that she did and she will look for his information at home. I explained to her that if she needs a referral to a new GI doctor, this will need to come from her PCP as Dr. Loanne Drilling only placed the referral for the bariatric surgery.   I have called Lattie Haw back to make her aware of the situation. She did not answer. I left a message for her to call us back.

## 2020-09-19 NOTE — Telephone Encounter (Signed)
Lmtcb for pt.  

## 2020-09-19 NOTE — Telephone Encounter (Signed)
Tried calling pt and there was no answer- LMTCB.  

## 2020-09-24 ENCOUNTER — Ambulatory Visit: Payer: Self-pay | Admitting: Specialist

## 2020-09-25 ENCOUNTER — Telehealth (HOSPITAL_COMMUNITY): Payer: Self-pay

## 2020-09-25 ENCOUNTER — Telehealth: Payer: Self-pay

## 2020-09-25 NOTE — Telephone Encounter (Signed)
Call to [facility] for Vent compliance report as patient is due for recert.  New copliance report requested.

## 2020-09-25 NOTE — Telephone Encounter (Signed)
Called and spoke with pt in regards to PR, pt stated she is not interested at this time due to her walking.   Closed referral

## 2020-09-25 NOTE — Telephone Encounter (Signed)
Pt insurance is active and benefits verified through [facility]. Co-pay $20.00, DED $0.00/$0.00 met, out of pocket $4,500.00/$1,970.35 met, co-insurance 0%. No pre-authorization required. [employer], 09/25/20 @ 11:46AM, XIH#[id number]   Will contact patient to see if she is interested in the Pulmonary Rehab Program.

## 2020-09-27 ENCOUNTER — Other Ambulatory Visit: Payer: Self-pay

## 2020-09-27 ENCOUNTER — Ambulatory Visit: Payer: Self-pay

## 2020-09-27 ENCOUNTER — Encounter: Payer: Self-pay | Admitting: Surgery

## 2020-09-27 ENCOUNTER — Ambulatory Visit (INDEPENDENT_AMBULATORY_CARE_PROVIDER_SITE_OTHER): Payer: Medicare Other | Admitting: Surgery

## 2020-09-27 VITALS — BP 157/93 | HR 97 | Ht 64.5 in | Wt 374.2 lb

## 2020-09-27 DIAGNOSIS — M5442 Lumbago with sciatica, left side: Secondary | ICD-10-CM | POA: Diagnosis not present

## 2020-09-27 DIAGNOSIS — M4317 Spondylolisthesis, lumbosacral region: Secondary | ICD-10-CM

## 2020-09-27 DIAGNOSIS — M5441 Lumbago with sciatica, right side: Secondary | ICD-10-CM | POA: Diagnosis not present

## 2020-09-27 DIAGNOSIS — Z6841 Body Mass Index (BMI) 40.0 and over, adult: Secondary | ICD-10-CM

## 2020-09-28 ENCOUNTER — Encounter: Payer: Self-pay | Admitting: Surgery

## 2020-09-28 NOTE — Progress Notes (Signed)
Office Visit Note   Patient: Tricia Perry           Date of Birth: 03/09/1956           MRN: [id number] Visit Date: 09/27/2020              Requested by: [name], PA-C [id number] [place],  [place] [address] PCP: [name], PA-C   Assessment & Plan: Visit Diagnoses:  1. Acute bilateral low back pain with bilateral sciatica   2. Spondylolisthesis of lumbosacral region   3. Class 3 severe obesity with serious comorbidity and body mass index (BMI) of 60.0 to 69.9 in adult, unspecified obesity type (Tricia Perry)     Plan: Had long discussion with patient regarding her difficulty ambulating.  Patient kept mentioning the issue causing this was more of her back and her knee.  Advised patient that she has other medical issues with morbid obesity, chronic O2 nasal cannula, pulmonary issues, lymphedema and cardiac issues that are also contributing factors.  I discussed with patient that these factors are very likely the reason why agrees with orthopedics did not want to recommend surgery for her left knee and also her lumbar spine in the past.  I will repeat lumbar MRI and compare to the study that was done in 2015.  Patient will follow-up with Dr. [name] after completion to discuss results and any future conservative measures.  I did advise patient that with what was seen on her previous MRI and x-rays today with spondylolisthesis at L4-5 and L5-S1 that I think that it would be unlikely that Dr. [name] would be recommending a fusion procedure with all of her comorbidities.  She voices understanding.  Dr. [name] can also discuss left knee DJD issues and see what he would like to do there.  Patient has had multiple injections in the past at another office.  I also did speak to patient about getting a bariatric surgery referral from her primary care physician to see if this is an option.   Follow-Up Instructions: Return in about 3 weeks (around 10/18/2020) for with dr  yates to review lumbar mri scan.   Orders:  Orders Placed This Encounter  Procedures   XR Lumbar Spine 2-3 Views   MR Lumbar Spine w/o contrast   No orders of the defined types were placed in this encounter.     Procedures: No procedures performed   Clinical Data: No additional findings.   Subjective: Chief Complaint  Patient presents with   Lower Back - Pain    HPI 65 year old white female comes in today with complaints of low back pain.  She had back pain for several years.  She was seen by Dr. [name] several years ago and she had lumbar MRI scan ordered by him which was done Aug 28, 2013 and that report showed:  Narrative  CLINICAL DATA:  Low back pain and bilateral leg pain with weakness  and numbness for 3 for years, worse recently.   EXAM:  MRI LUMBAR SPINE WITHOUT CONTRAST   TECHNIQUE:  Multiplanar, multisequence MR imaging of the lumbar spine was  performed. No intravenous contrast was administered.   COMPARISON:  No comparison lumbar spine MR. Comparison CT abdomen  and pelvis 03/22/2013.   FINDINGS:  Last fully open disk space is labeled L5-S1. Present examination  incorporates from T11-12 disc space through the lower sacrum.   Conus L1 level.   Patient previously was noted to have renal  calculi with fullness of  the right renal collecting system and right renal scarring.  Right-sided hydronephrosis or renal cysts may be present but  incompletely assessed   T11-12 mild kyphosis with bulge contributes to mild posterior  deflection of the cord. Axial images not obtained. Increased signal  within the distal cord on STIR sequence may be related to artifact  rather than edema however if the patient had a lower thoracic cord  myelopathy, thoracic spine MR with axial imaging through this region  could be performed for further delineation.   T12-L1:  Negative.   L1-2:  Minimal facet joint degenerative changes.  Minimal bulge.   L2-3:  Facet joint  degenerative changes.  Minimal bulge.   L3-4:  Facet joint degenerative changes.  Minimal bulge.   L4-5: Moderate facet joint degenerative changes. 2.4 mm anterior  slippage of L4. Ligamentum flavum hypertrophy. Mild bulge. Mild  spinal stenosis.   L5-S1: Elongated degenerated pars with prominent facet joint  degenerative changes and bony overgrowth. 2.6 mm anterior slippage  of L5. Mild bulge. Conjoined left L5-S1 nerve root. No significant  spinal stenosis or foraminal narrowing.   IMPRESSION:  T11-12 mild kyphosis with bulge contributes to mild posterior  deflection of the cord. Please see above discussion.   L4-5 moderate facet joint degenerative changes. Multifactorial mild  spinal stenosis.   L5-S1 elongated degenerated pars with prominent facet joint  degenerative changes and bony overgrowth. 2.6 mm anterior slippage  of L5. Mild bulge. Conjoined left L5-S1 nerve root. No significant  spinal stenosis or foraminal narrowing.   Patient previously was noted to have renal calculi with fullness of  the right renal collecting system and right renal scarring.  Right-sided hydronephrosis or renal cysts may be present but  incompletely assessed.    Electronically Signed    By: [name] M.D.    On: 08/28/2013 16:45   Patient states that she most recently was seen by Dr. Maxie Better spine surgeon at [facility] and patient stated that he did not order a new MRI nor did he recommend any surgical procedures.  Patient mentions seen another spine surgeon in town last year as well.  Patient ambulates with a walker.  She complains of ongoing back pain at all times.  Pain radiates down to her right foot.  States that she cannot stand while she is in the kitchen.  During the visit patient also mentioned that she has ongoing problems with her left knee secondary to DJD.  She was also seen by physician for this at [facility] and states that she had multiple injections.  They did not recommend and  this appears it may be related to her comorbidities.  I reviewed patient's chart and this is significant for morbid obesity with BMI 63.24, CHF, hypoxia, chronic home O2.  Patient also states that she has lymphedema in her legs which makes it harder for her to walk.     Review of Systems Patient has exertional dyspnea.  She is on chronic O2 nasal cannula.  Objective: Vital Signs: BP (!) 157/93   Pulse 97   Ht 5' 4.5" (1.638 m)   Wt (!) 374 lb 3.2 oz (169.7 kg)   BMI 63.24 kg/m   Physical Exam Constitutional:      Appearance: She is obese.  HENT:     Head: Normocephalic.  Eyes:     Extraocular Movements: Extraocular movements intact.  Musculoskeletal:     Comments: Positive lumbar paraspinal tenderness.  Neurologically intact.  Left  knee joint line tender.  Neurological:     Mental Status: She is alert and oriented to person, place, and time.  Psychiatric:        Mood and Affect: Mood normal.    Ortho Exam  Specialty Comments:  No specialty comments available.  Imaging: No results found.   PMFS History: Patient Active Problem List   Diagnosis Date Noted   Chronic diastolic heart failure ([place]) 52/84/1324   Diastolic dysfunction 40/12/2723   Acute on chronic respiratory failure ([place]) 04/30/2020   Morbid obesity ([place]) 04/30/2020   Acute on chronic respiratory failure with hypoxia and hypercapnia ([place]) 04/30/2020   Acute hypoxemic respiratory failure ([place]) 04/24/2020   Hypoxia 04/21/2020   Maxillary sinusitis 02/19/2015   OSA (obstructive sleep apnea) 12/13/2013   Hypothyroidism 12/04/2013   Varicose veins of lower extremities with other complications 36/64/4034   Insomnia 06/13/2013   Pulmonary embolism ([place]) 05/29/2013   Shortness of breath 04/20/2013   Insulin resistance 11/29/2012   HTN (hypertension) 11/29/2012   Vitamin B12 deficiency 11/29/2012   Chronic pain 07/11/2010   Chronic venous insufficiency 06/21/2010   B12 DEFICIENCY 05/10/2010   IRRITABLE  BOWEL SYNDROME 05/10/2010   MALABSORPTION SYNDROME 05/10/2010   ESSENTIAL HYPERTENSION 04/12/2010   Past Medical History:  Diagnosis Date   Arthritis    Knees; right-TKR, left-bone on bone/end stage   Asthma    Carpal tunnel syndrome on both sides 06/21/2010   Carpal tunnel syndrome, bilateral    Chronic nonallergic rhinitis    allergy eval NEG 10/2014: azelastine and flonase spray rx'd   Chronic venous insufficiency    with edema  and extensive varicose dz: Dr. Linus Mako is managing this, planning laser procedures.; A LOT OF LEG CRAMPS AND LEG STIFFNESS   Complication of anesthesia    STATES SHE WAS TOLD SHE WOKE UP DURING HER KNEE REPLACEMENT SURGERY AND HAD TO BE GIVEN MORE MEDICINE - NOT SURE IF SPINAL OR GENERAL ANESTHESIA   DDD (degenerative disc disease)    Dr. [name] evaluated her and recommended pain mgmt MD.  She saw Dr. Selinda Orion for pain mgmt at one time.   Diabetes mellitus without complication ([place])    Type II   Disequilibrium syndrome    MRI brain normal 2012   DIZZINESS 04/12/2010   Qualifier: Diagnosis of  By: Anitra Lauth M.D., Brien Few    Fibromyalgia    questionable   GERD (gastroesophageal reflux disease)    Heart murmur    functional, w/u done; PALPITATIONS IN THE PAST   History of bronchitis    History of palpitations    History of pulmonary embolism 05/2013   Right sided.  Setting: post-op percutaneous nephrolithotomy--xarelto x 6 mo   Hypertension    Hypothyroidism    Insomnia    Insulin resistance    Kidney stone on right side 12/2014   Dr. Jeffie Pollock considering PCNL, ESWL, and ureteroscopy as of 01/25/15    Microscopic hematuria 2014   CT abd pelv showed 1.8 cm right renal pelvis stone.  Also had UA c/w UTI so abx given.    Mixed incontinence urge and stress    and nocturia x 5-6 per night   Morbid obesity (HCC)    BMI 49.  Gastric stapling surgery in distant past.   Nephrolithiasis    Lithotripsy 2002.  Right percut nephrolith 05/2013.   Nocturia     Numbness and tingling    bilateral hands and feet   OAB (overactive bladder)    Per  Dr. Jeffie Pollock: pt declined pelvic floor PT.  Vesicare trial started 01/25/15.   Obesity hypoventilation syndrome (HCC)    Peripheral neuropathy    No old records to confirm pt's report.  Pt refuses to have more NCS b/c test is painful.   PONV (postoperative nausea and vomiting)    Shortness of breath    CHRONIC; 3 mo trial off of ACE-I made no difference.  Spirometry x 2 has shown no obstruction.  Allergy w/u NEG.   Shoulder pain    HX OF BILATERAL SHOULDER SURGERIES - PT HAS VERY LIMITED ROM - ESPECIALLY RAISING HER ARMS   Sleep apnea    PT DOES NOT HAVE MASK OR TUBING - JUST SLEEPS WITH HEAD ELEVATED ON 2 PILLOWS    Spinal stenosis    with L5 radiculopathy, also pseudospondylolisthesis per Dr. [name]   Superficial thrombophlebitis of left leg 05/2013   with cellulitis--resolved approp with abx and ice   Urinary incontinence    Urinary urgency    Vertigo     Family History  Problem Relation Age of Onset   Hypertension Mother    Cancer Mother        Brain/Lung/ smoker   Diabetes Mother    Stroke Father    Hypertension Father    ADD / ADHD Daughter        ADHD   Thyroid disease Brother    Hypertension Brother    Varicose Veins Brother    Peripheral vascular disease Brother    Heart attack Maternal Grandfather    Alcohol abuse Paternal Grandfather    Breast cancer Maternal Grandmother    Breast cancer Paternal Aunt    Breast cancer Paternal Aunt     Past Surgical History:  Procedure Laterality Date   BILATERAL SHOULDER SURGERY     BREAST EXCISIONAL BIOPSY Left 11/12/2015   BREAST LUMPECTOMY WITH RADIOACTIVE SEED LOCALIZATION Left 11/12/2015   Procedure: LEFT BREAST LUMPECTOMY WITH RADIOACTIVE SEED LOCALIZATION;  Surgeon: Autumn Messing III, MD;  Location: [facility];  Service: General;  Laterality: Left;   CHOLECYSTECTOMY  1987   ENDOMETRIAL ABLATION     menorrhagia   EXTRACORPOREAL SHOCK WAVE  LITHOTRIPSY     FOOT SURGERY  2000   Right tarsel tunnel release   GASTRIC RESTRICTION SURGERY  remote past   HERNIA REPAIR   1980's   Diaphragmatic + gastric stapling   HERNIA REPAIR     Inguinal   JOINT REPLACEMENT  2009   Right Knee, [place]   NEPHROLITHOTOMY Right 05/31/2013   Procedure: NEPHROLITHOTOMY PERCUTANEOUS;  Surgeon: Irine Seal, MD;  Location: [facility];  Service: Urology;  Laterality: Right;   PFT's  09/2013; 12/2013   09/2013 mild restriction.  12/2013 Low vital capacity, possibly due to restriction from pt's body habitus.   TRANSTHORACIC ECHOCARDIOGRAM  12/16/13   EF 85-63%, grade 2 diastolic dysfxn, PA pressure 37 (mildly high)   Social History   Occupational History   Occupation: HOUSEWIFE    Employer: UNEMPLOYED  Tobacco Use   Smoking status: Former    Packs/day: 1.00    Years: 3.00    Pack years: 3.00    Types: Cigarettes    Start date: 05/17/1976    Quit date: 03/31/1998    Years since quitting: 22.5   Smokeless tobacco: Never  Substance and Sexual Activity   Alcohol use: Yes    Comment: social   Drug use: No   Sexual activity: Not on file

## 2020-10-02 ENCOUNTER — Telehealth: Payer: Self-pay | Admitting: Gastroenterology

## 2020-10-02 NOTE — Telephone Encounter (Signed)
Request received to transfer GI care from outside practice to [facility].  We appreciate the interest in our practice, however at this time due to high demand from patients without established GI providers we cannot accommodate this transfer.  Ability to accommodate future transfer requests may change over time and the patient can contact us again in 6-12 months if still interested in being seen at [facility].      °

## 2020-10-02 NOTE — Telephone Encounter (Signed)
Hey Dr. Fuller Plan... This patient wants to transfer of care from [facility] to [facility]. Patient is requesting to sch a colon. Per pt's request for today you are the DOD AM provider. Rcd's are in Epic for review. Please advise on scheduling. Thanks

## 2020-10-03 NOTE — Telephone Encounter (Signed)
I did advise the pt about the high demand from pt's without established GI providers and for her contact our office in 6-12 months if she is still interested to see a provider from [facility]. Pt stated she will try another GI practice at this time.

## 2020-10-10 ENCOUNTER — Other Ambulatory Visit: Payer: Medicare Other

## 2020-10-10 ENCOUNTER — Other Ambulatory Visit: Payer: Self-pay | Admitting: *Deleted

## 2020-10-10 DIAGNOSIS — J9611 Chronic respiratory failure with hypoxia: Secondary | ICD-10-CM

## 2020-10-10 NOTE — Progress Notes (Signed)
Ventilator compliance report from March 27-May 26 reviewed. Patient uses ventilator 5 hours nightly on average with >70% utilization during this time interval. Will send note to her DME  Rodman Pickle, M.D. [facility] Pulmonary/Critical Care Medicine 10/10/2020 4:57 PM

## 2020-10-11 ENCOUNTER — Telehealth: Payer: Self-pay

## 2020-10-11 NOTE — Telephone Encounter (Signed)
-----   Message from [place], MD sent at 10/10/2020  5:02 PM EDT ----- Patient had previously requested for [place] to report to her DME that she is compliant with her ventilator. Vent compliance report reviewed and documented. Please send notes from this encounter to her DME

## 2020-10-11 NOTE — Telephone Encounter (Signed)
Confirmation received that fax went through to [facility]. Nothing further needed at this time.

## 2020-10-11 NOTE — Telephone Encounter (Signed)
Pt stated that she does use [employer] as her DME company.  Pls regard; [phone]

## 2020-10-11 NOTE — Telephone Encounter (Signed)
FYI:  JE pt does use [employer] for her DME.

## 2020-10-11 NOTE — Telephone Encounter (Signed)
Tried to fax office notes to [employer] twice but got busy both times. Also tried to call them to verify fax number and also got a busy signal. Will try again

## 2020-10-11 NOTE — Telephone Encounter (Signed)
Received a message from Dr. Loanne Drilling stating that we need to call pt to ask her who her DME is for her Trilogy Vent as she does not think that [employer] is pt's DME for her trilogy vent as they were the ones who had sent [place] the compliance report on her vent.  We need to check with pt to see who she has received her trilogy vent from so that way we can make sure we are able to send the compliance information and pt's last OV to the correct DME.  Attempted to call pt but unable to reach. Left message for her to return call so we can confirm this from pt.

## 2020-10-15 NOTE — Telephone Encounter (Signed)
Paperwork has been faxed over to [employer] per request.

## 2020-10-16 ENCOUNTER — Ambulatory Visit: Payer: Medicare Other | Admitting: Orthopaedic Surgery

## 2020-10-17 ENCOUNTER — Other Ambulatory Visit: Payer: Self-pay

## 2020-10-17 ENCOUNTER — Ambulatory Visit
Admission: RE | Admit: 2020-10-17 | Discharge: 2020-10-17 | Disposition: A | Discharge status: Home / Self Care | Payer: Medicare Other | Source: Ambulatory Visit | Attending: Surgery | Admitting: Surgery

## 2020-10-17 DIAGNOSIS — M4317 Spondylolisthesis, lumbosacral region: Secondary | ICD-10-CM

## 2020-10-23 ENCOUNTER — Encounter: Payer: Self-pay | Admitting: Orthopaedic Surgery

## 2020-10-23 ENCOUNTER — Ambulatory Visit (INDEPENDENT_AMBULATORY_CARE_PROVIDER_SITE_OTHER): Payer: Medicare Other | Admitting: Orthopaedic Surgery

## 2020-10-23 ENCOUNTER — Other Ambulatory Visit: Payer: Self-pay

## 2020-10-23 VITALS — Ht 64.5 in | Wt 374.0 lb

## 2020-10-23 DIAGNOSIS — Z96651 Presence of right artificial knee joint: Secondary | ICD-10-CM

## 2020-10-23 DIAGNOSIS — Z6841 Body Mass Index (BMI) 40.0 and over, adult: Secondary | ICD-10-CM

## 2020-10-23 NOTE — Progress Notes (Signed)
Office Visit Note   Patient: Tricia Perry           Date of Birth: 1956-02-23           MRN: [id number] Visit Date: 10/23/2020              Requested by: [name], PA-C [id number] [place],  [place] [id number] PCP: [name], PA-C   Assessment & Plan: Visit Diagnoses:  1. Body mass index 60.0-69.9, adult ([place])   2. History of total knee arthroplasty, right   3.  Left knee primary osteoarthritis. 4.  Lumbar facet arthropathy. Plan: We discussed she needs to find a place where she can get into a pool and exercise to help strengthen her legs and work on weight loss.  She is unfortunately had weight gain years after bypass surgery and our ability to get around his been restricted mostly to the wheelchair.  We reviewed her MRI scan lumbar she is not a candidate for surgery at this point and we discussed requirement for BMI below 40 for total knee arthroplasty.  Follow-Up Instructions: No follow-ups on file.   Orders:  No orders of the defined types were placed in this encounter.  No orders of the defined types were placed in this encounter.     Procedures: No procedures performed   Clinical Data: No additional findings.   Subjective: Chief Complaint  Patient presents with   Lower Back - Pain    MRI review    HPI 65 year old female with BMI 63 with previous right total knee arthroplasty with continued pain in both knees.  She has lipoma over right knee vastus lateralis with asymmetry that bothers her primarily with cosmesis.  She has been on pain management was taking oxycodone 15 mg 4 a day for total of 120 monthly.  Patient is here with her husband who also had back surgery and he was taking the same medication.  She states that both are off medication.  She has diabetes with insulin resistance, hypertension hypothyroidism, sleep apnea, history of respiratory failure super morbid obesity diastolic dysfunction chronic diastolic heart  failure and has had previous gastric bypass surgery with malabsorption syndrome B12 deficiency.  She uses walker with ambulation.  Increased problems getting up quickly if the chair is low.  She is limited in her ability to ambulate.  Patient states she has chronic back pain.  She said MRI 09/17/2020 2020 which showed some facet arthropathy L4-5 L5-S1 without central or foraminal compression.  Review of Systems negative for fever or chills.  No cellulitis.   Objective: Vital Signs: Ht 5' 4.5" (1.638 m)   Wt (!) 374 lb (169.6 kg)   BMI 63.21 kg/m   Physical Exam Constitutional:      Appearance: She is well-developed.  HENT:     Head: Normocephalic.     Right Ear: External ear normal.     Left Ear: External ear normal. There is no impacted cerumen.  Eyes:     Pupils: Pupils are equal, round, and reactive to light.  Neck:     Thyroid: No thyromegaly.     Trachea: No tracheal deviation.  Cardiovascular:     Rate and Rhythm: Normal rate.  Pulmonary:     Effort: Pulmonary effort is normal.  Abdominal:     Palpations: Abdomen is soft.  Musculoskeletal:     Cervical back: No rigidity.  Skin:    General: Skin is warm and dry.  Neurological:     Mental Status: She is alert and oriented to person, place, and time.  Psychiatric:        Behavior: Behavior normal.    Ortho Exam well-healed right total knee arthroplasty incision.  Negative logroll of the hips.  She is very slow getting sitting standing and has difficulty with ambulation slow deliberate gait.  Adipose accumulation over the vastus lateralis asymmetrical more prominent on the right than left.  There are other areas around her hips abdomen with asymmetrical adipose buildup.  Specialty Comments:  No specialty comments available.  Imaging: No results found.   PMFS History: Patient Active Problem List   Diagnosis Date Noted   History of total knee arthroplasty, right 10/24/2020   Chronic diastolic heart failure ([place])  [id number]   Diastolic dysfunction 123456   Acute on chronic respiratory failure ([place]) 04/30/2020   Morbid obesity ([place]) 04/30/2020   Acute on chronic respiratory failure with hypoxia and hypercapnia ([place]) 04/30/2020   Acute hypoxemic respiratory failure ([place]) 04/24/2020   Hypoxia 04/21/2020   Maxillary sinusitis 02/19/2015   OSA (obstructive sleep apnea) 12/13/2013   Hypothyroidism 12/04/2013   Varicose veins of lower extremities with other complications 123XX123   Insomnia 06/13/2013   Pulmonary embolism ([place]) 05/29/2013   Shortness of breath 04/20/2013   Insulin resistance 11/29/2012   HTN (hypertension) 11/29/2012   Vitamin B12 deficiency 11/29/2012   Chronic pain 07/11/2010   Chronic venous insufficiency 06/21/2010   B12 DEFICIENCY 05/10/2010   IRRITABLE BOWEL SYNDROME 05/10/2010   MALABSORPTION SYNDROME 05/10/2010   ESSENTIAL HYPERTENSION 04/12/2010   Past Medical History:  Diagnosis Date   Arthritis    Knees; right-TKR, left-bone on bone/end stage   Asthma    Carpal tunnel syndrome on both sides 06/21/2010   Carpal tunnel syndrome, bilateral    Chronic nonallergic rhinitis    allergy eval NEG 10/2014: azelastine and flonase spray rx'd   Chronic venous insufficiency    with edema  and extensive varicose dz: Dr. Linus Mako is managing this, planning laser procedures.; A LOT OF LEG CRAMPS AND LEG STIFFNESS   Complication of anesthesia    STATES SHE WAS TOLD SHE WOKE UP DURING HER KNEE REPLACEMENT SURGERY AND HAD TO BE GIVEN MORE MEDICINE - NOT SURE IF SPINAL OR GENERAL ANESTHESIA   DDD (degenerative disc disease)    Dr. [name] evaluated her and recommended pain mgmt MD.  She saw Dr. Selinda Orion for pain mgmt at one time.   Diabetes mellitus without complication ([place])    Type II   Disequilibrium syndrome    MRI brain normal 2012   DIZZINESS 04/12/2010   Qualifier: Diagnosis of  By: Anitra Lauth M.D., Brien Few    Fibromyalgia    questionable   GERD (gastroesophageal  reflux disease)    Heart murmur    functional, w/u done; PALPITATIONS IN THE PAST   History of bronchitis    History of palpitations    History of pulmonary embolism 05/2013   Right sided.  Setting: post-op percutaneous nephrolithotomy--xarelto x 6 mo   Hypertension    Hypothyroidism    Insomnia    Insulin resistance    Kidney stone on right side 12/2014   Dr. Jeffie Pollock considering PCNL, ESWL, and ureteroscopy as of 01/25/15    Microscopic hematuria 2014   CT abd pelv showed 1.8 cm right renal pelvis stone.  Also had UA c/w UTI so abx given.    Mixed incontinence urge and stress    and nocturia x  5-6 per night   Morbid obesity ([facility])    BMI 49.  Gastric stapling surgery in distant past.   Nephrolithiasis    Lithotripsy 2002.  Right percut nephrolith 05/2013.   Nocturia    Numbness and tingling    bilateral hands and feet   OAB (overactive bladder)    Per Dr. Jeffie Pollock: pt declined pelvic floor PT.  Vesicare trial started 01/25/15.   Obesity hypoventilation syndrome ([facility])    Peripheral neuropathy    No old records to confirm pt's report.  Pt refuses to have more NCS b/c test is painful.   PONV (postoperative nausea and vomiting)    Shortness of breath    CHRONIC; 3 mo trial off of ACE-I made no difference.  Spirometry x 2 has shown no obstruction.  Allergy w/u NEG.   Shoulder pain    HX OF BILATERAL SHOULDER SURGERIES - PT HAS VERY LIMITED ROM - ESPECIALLY RAISING HER ARMS   Sleep apnea    PT DOES NOT HAVE MASK OR TUBING - JUST SLEEPS WITH HEAD ELEVATED ON 2 PILLOWS    Spinal stenosis    with L5 radiculopathy, also pseudospondylolisthesis per Dr. [name]   Superficial thrombophlebitis of left leg 05/2013   with cellulitis--resolved approp with abx and ice   Urinary incontinence    Urinary urgency    Vertigo     Family History  Problem Relation Age of Onset   Hypertension Mother    Cancer Mother        Brain/Lung/ smoker   Diabetes Mother    Stroke Father    Hypertension Father     ADD / ADHD Daughter        ADHD   Thyroid disease Brother    Hypertension Brother    Varicose Veins Brother    Peripheral vascular disease Brother    Heart attack Maternal Grandfather    Alcohol abuse Paternal Grandfather    Breast cancer Maternal Grandmother    Breast cancer Paternal Aunt    Breast cancer Paternal Aunt     Past Surgical History:  Procedure Laterality Date   BILATERAL SHOULDER SURGERY     BREAST EXCISIONAL BIOPSY Left 11/12/2015   BREAST LUMPECTOMY WITH RADIOACTIVE SEED LOCALIZATION Left 11/12/2015   Procedure: LEFT BREAST LUMPECTOMY WITH RADIOACTIVE SEED LOCALIZATION;  Surgeon: Autumn Messing III, MD;  Location: [facility];  Service: General;  Laterality: Left;   CHOLECYSTECTOMY  1987   ENDOMETRIAL ABLATION     menorrhagia   EXTRACORPOREAL SHOCK WAVE LITHOTRIPSY     FOOT SURGERY  2000   Right tarsel tunnel release   GASTRIC RESTRICTION SURGERY  remote past   HERNIA REPAIR   1980's   Diaphragmatic + gastric stapling   HERNIA REPAIR     Inguinal   JOINT REPLACEMENT  2009   Right Knee, [place]   NEPHROLITHOTOMY Right 05/31/2013   Procedure: NEPHROLITHOTOMY PERCUTANEOUS;  Surgeon: Irine Seal, MD;  Location: [facility];  Service: Urology;  Laterality: Right;   PFT's  09/2013; 12/2013   09/2013 mild restriction.  12/2013 Low vital capacity, possibly due to restriction from pt's body habitus.   TRANSTHORACIC ECHOCARDIOGRAM  12/16/13   EF 123456, grade 2 diastolic dysfxn, PA pressure 37 (mildly high)   Social History   Occupational History   Occupation: HOUSEWIFE    Employer: UNEMPLOYED  Tobacco Use   Smoking status: Former    Packs/day: 1.00    Years: 3.00    Pack years: 3.00    Types:  Cigarettes    Start date: 05/17/1976    Quit date: 03/31/1998    Years since quitting: 22.5   Smokeless tobacco: Never  Substance and Sexual Activity   Alcohol use: Yes    Comment: social   Drug use: No   Sexual activity: Not on file

## 2020-10-24 ENCOUNTER — Telehealth: Payer: Self-pay | Admitting: Orthopaedic Surgery

## 2020-10-24 DIAGNOSIS — Z96651 Presence of right artificial knee joint: Secondary | ICD-10-CM | POA: Insufficient documentation

## 2020-10-24 DIAGNOSIS — D1723 Benign lipomatous neoplasm of skin and subcutaneous tissue of right leg: Secondary | ICD-10-CM

## 2020-10-24 NOTE — Telephone Encounter (Signed)
Pt wants to know if you can send a referral to plastic surgery in [place] fax number # is [phone].   CB [phone]

## 2020-10-24 NOTE — Telephone Encounter (Signed)
Please advise 

## 2020-10-25 NOTE — Telephone Encounter (Signed)
Referral entered  

## 2020-10-25 NOTE — Addendum Note (Signed)
Addended by: Meyer Cory on: 10/25/2020 11:47 AM   Modules accepted: Orders

## 2020-10-25 NOTE — Telephone Encounter (Signed)
I called patient and advised referral placed.

## 2020-12-10 ENCOUNTER — Other Ambulatory Visit: Payer: Self-pay | Admitting: Gastroenterology

## 2020-12-10 DIAGNOSIS — R109 Unspecified abdominal pain: Secondary | ICD-10-CM

## 2020-12-11 ENCOUNTER — Other Ambulatory Visit: Payer: Self-pay | Admitting: Gastroenterology

## 2020-12-11 DIAGNOSIS — R197 Diarrhea, unspecified: Secondary | ICD-10-CM

## 2020-12-26 ENCOUNTER — Other Ambulatory Visit: Payer: Medicare Other

## 2021-01-02 ENCOUNTER — Ambulatory Visit
Admission: RE | Admit: 2021-01-02 | Discharge: 2021-01-02 | Disposition: A | Discharge status: Home / Self Care | Payer: Medicare Other | Source: Ambulatory Visit | Attending: Gastroenterology | Admitting: Gastroenterology

## 2021-01-02 DIAGNOSIS — R197 Diarrhea, unspecified: Secondary | ICD-10-CM

## 2021-01-02 MED ORDER — IOPAMIDOL (ISOVUE-300) INJECTION 61%
100.0000 mL | Freq: Once | INTRAVENOUS | Status: AC | PRN
  Administered 2021-01-02: 100 mL via INTRAVENOUS

## 2021-07-29 ENCOUNTER — Other Ambulatory Visit: Payer: Self-pay | Admitting: Pulmonary Disease

## 2021-07-29 NOTE — Telephone Encounter (Signed)
Patient requesting lasix refill. Declined due to needing appointment for evaluation. Last visit was in June 2022 at [facility]. Please offer appointment for pulmonary evaluation as she is due for re-evaluation for her chronic hypoxemic and hypercarbic respiratory failure secodary to OSA/OHS.  ? ?However, if she only needs lasix for her lymphedema, she will need to contact her primary care doctor ?

## 2021-08-14 ENCOUNTER — Telehealth: Payer: Self-pay | Admitting: Pulmonary Disease

## 2021-08-15 NOTE — Telephone Encounter (Signed)
ATC patient, LMTCB 

## 2021-09-09 ENCOUNTER — Ambulatory Visit: Payer: Medicare Other | Admitting: Pulmonary Disease

## 2021-09-24 ENCOUNTER — Encounter: Payer: Self-pay | Admitting: Pulmonary Disease

## 2021-09-24 ENCOUNTER — Ambulatory Visit: Payer: Medicare Other | Admitting: Pulmonary Disease

## 2021-09-24 VITALS — BP 130/82 | HR 98 | Temp 98.1°F | Ht 64.5 in | Wt 389.4 lb

## 2021-09-24 DIAGNOSIS — J9612 Chronic respiratory failure with hypercapnia: Secondary | ICD-10-CM

## 2021-09-26 NOTE — Telephone Encounter (Signed)
Since calling the office, pt was seen by Dr. Loanne Drilling for an appt. A qualifying walk for POC was done and pt did not require any O2 as stats remained above 90% the entire walk.

## 2021-12-26 ENCOUNTER — Institutional Professional Consult (permissible substitution): Payer: Medicare Other | Admitting: Plastic Surgery

## 2022-01-08 ENCOUNTER — Encounter: Payer: Self-pay | Admitting: Plastic Surgery

## 2022-01-08 ENCOUNTER — Ambulatory Visit: Payer: Medicare Other | Admitting: Plastic Surgery

## 2022-01-08 DIAGNOSIS — Z9884 Bariatric surgery status: Secondary | ICD-10-CM | POA: Diagnosis not present

## 2022-01-08 DIAGNOSIS — Z6841 Body Mass Index (BMI) 40.0 and over, adult: Secondary | ICD-10-CM

## 2022-01-08 DIAGNOSIS — Z7409 Other reduced mobility: Secondary | ICD-10-CM

## 2022-01-08 DIAGNOSIS — R600 Localized edema: Secondary | ICD-10-CM

## 2022-01-08 NOTE — Progress Notes (Signed)
Referring Provider [name], PA-C [facility] [address],  [place] [id number]   CC:  Chief Complaint  Patient presents with   Advice Only      Tricia Perry is an 66 y.o. female.  HPI: Tricia Perry is a 66 year old female who is referred for evaluation of swelling of her right lower extremity.  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  Her dermatologist also felt that she might have a process that was amenable to debulking.  The patient is morbidly obese and has been unable to lose any weight and is having more and more trouble with mobility due to her back.  She has been oxygen dependent at times however she is not wearing oxygen at the moment.  Allergies  Allergen Reactions   Gabapentin Swelling    Legs    Mold Extract [Trichophyton] Nausea And Vomiting and Other (See Comments)    Sneezing real bad    Morphine Itching and Other (See Comments)    back hurts   Codeine Itching and Other (See Comments)    Back hurts    Outpatient Encounter Medications as of 01/08/2022  Medication Sig   acetaminophen (TYLENOL) 325 MG tablet Take 650 mg by mouth every 6 (six) hours as needed for mild pain.   albuterol (PROVENTIL) (2.5 MG/3ML) 0.083% nebulizer solution Take 3 mLs (2.5 mg total) by nebulization every 6 (six) hours as needed for wheezing or shortness of breath.   albuterol (VENTOLIN HFA) 108 (90 Base) MCG/ACT inhaler albuterol sulfate HFA 90 mcg/actuation aerosol inhaler  INL 2 PFS PO Q 4 TO 6 H PRF WHZ OR SOB   amLODipine (NORVASC) 5 MG tablet Take 5 mg by mouth daily.   aspirin EC 81 MG tablet Take 81 mg by mouth daily. Swallow whole.   cholecalciferol (VITAMIN D3) 25 MCG (1000 UNIT) tablet Take 1,000 Units by mouth daily.   Ferrous Sulfate (IRON PO) Take 1 tablet by mouth daily.   furosemide (LASIX) 20 MG tablet TAKE 1 TABLET(20 MG) BY MOUTH DAILY   levothyroxine (SYNTHROID, LEVOTHROID) 200 MCG  tablet TAKE ONE TABLET BY MOUTH ONE TIME DAILY before breakfast   losartan (COZAAR) 100 MG tablet Take 100 mg by mouth daily.   montelukast (SINGULAIR) 10 MG tablet Take 10 mg by mouth in the morning.   OXYGEN Inhale 2.5 L into the lungs continuous.   VITAMIN A PO Take 2 capsules by mouth daily.   vitamin C (ASCORBIC ACID) 500 MG tablet Take 500 mg by mouth daily.   Vitamin D, Ergocalciferol, (DRISDOL) 1.25 MG (50000 UNIT) CAPS capsule Take 50,000 Units by mouth once a week. Take on Thursdays   No facility-administered encounter medications on file as of 01/08/2022.     Past Medical History:  Diagnosis Date   Arthritis    Knees; right-TKR, left-bone on bone/end stage   Asthma    Carpal tunnel syndrome on both sides 06/21/2010   Carpal tunnel syndrome, bilateral    Chronic nonallergic rhinitis    allergy eval NEG 10/2014: azelastine and flonase spray rx'd   Chronic venous insufficiency    with edema  and extensive varicose dz: Dr. Linus Mako is managing this, planning laser procedures.; A LOT OF LEG CRAMPS AND LEG STIFFNESS   Complication of anesthesia    STATES SHE WAS TOLD SHE WOKE UP DURING HER KNEE REPLACEMENT SURGERY AND HAD TO BE GIVEN MORE MEDICINE -  NOT SURE IF SPINAL OR GENERAL ANESTHESIA   DDD (degenerative disc disease)    Dr. [name] evaluated her and recommended pain mgmt MD.  She saw Dr. Selinda Orion for pain mgmt at one time.   Diabetes mellitus without complication ([place])    Type II   Disequilibrium syndrome    MRI brain normal 2012   DIZZINESS 04/12/2010   Qualifier: Diagnosis of  By: Anitra Lauth M.D., Brien Few    Fibromyalgia    questionable   GERD (gastroesophageal reflux disease)    Heart murmur    functional, w/u done; PALPITATIONS IN THE PAST   History of bronchitis    History of palpitations    History of pulmonary embolism 05/2013   Right sided.  Setting: post-op percutaneous nephrolithotomy--xarelto x 6 mo   Hypertension    Hypothyroidism    Insomnia     Insulin resistance    Kidney stone on right side 12/2014   Dr. Jeffie Pollock considering PCNL, ESWL, and ureteroscopy as of 01/25/15    Microscopic hematuria 2014   CT abd pelv showed 1.8 cm right renal pelvis stone.  Also had UA c/w UTI so abx given.    Mixed incontinence urge and stress    and nocturia x 5-6 per night   Morbid obesity ([facility])    BMI 49.  Gastric stapling surgery in distant past.   Nephrolithiasis    Lithotripsy 2002.  Right percut nephrolith 05/2013.   Nocturia    Numbness and tingling    bilateral hands and feet   OAB (overactive bladder)    Per Dr. Jeffie Pollock: pt declined pelvic floor PT.  Vesicare trial started 01/25/15.   Obesity hypoventilation syndrome (HCC)    Peripheral neuropathy    No old records to confirm pt's report.  Pt refuses to have more NCS b/c test is painful.   PONV (postoperative nausea and vomiting)    Shortness of breath    CHRONIC; 3 mo trial off of ACE-I made no difference.  Spirometry x 2 has shown no obstruction.  Allergy w/u NEG.   Shoulder pain    HX OF BILATERAL SHOULDER SURGERIES - PT HAS VERY LIMITED ROM - ESPECIALLY RAISING HER ARMS   Sleep apnea    PT DOES NOT HAVE MASK OR TUBING - JUST SLEEPS WITH HEAD ELEVATED ON 2 PILLOWS    Spinal stenosis    with L5 radiculopathy, also pseudospondylolisthesis per Dr. [name]   Superficial thrombophlebitis of left leg 05/2013   with cellulitis--resolved approp with abx and ice   Urinary incontinence    Urinary urgency    Vertigo     Past Surgical History:  Procedure Laterality Date   BILATERAL SHOULDER SURGERY     BREAST EXCISIONAL BIOPSY Left 11/12/2015   BREAST LUMPECTOMY WITH RADIOACTIVE SEED LOCALIZATION Left 11/12/2015   Procedure: LEFT BREAST LUMPECTOMY WITH RADIOACTIVE SEED LOCALIZATION;  Surgeon: Autumn Messing III, MD;  Location: [facility];  Service: General;  Laterality: Left;   CHOLECYSTECTOMY  1987   ENDOMETRIAL ABLATION     menorrhagia   EXTRACORPOREAL SHOCK WAVE LITHOTRIPSY     FOOT SURGERY  2000    Right tarsel tunnel release   GASTRIC RESTRICTION SURGERY  remote past   HERNIA REPAIR   1980's   Diaphragmatic + gastric stapling   HERNIA REPAIR     Inguinal   JOINT REPLACEMENT  2009   Right Knee, [place]   NEPHROLITHOTOMY Right 05/31/2013   Procedure: NEPHROLITHOTOMY PERCUTANEOUS;  Surgeon: Irine Seal, MD;  Location:  [facility];  Service: Urology;  Laterality: Right;   PFT's  09/2013; 12/2013   09/2013 mild restriction.  12/2013 Low vital capacity, possibly due to restriction from pt's body habitus.   TRANSTHORACIC ECHOCARDIOGRAM  12/16/13   EF 19-37%, grade 2 diastolic dysfxn, PA pressure 37 (mildly high)    Family History  Problem Relation Age of Onset   Hypertension Mother    Cancer Mother        Brain/Lung/ smoker   Diabetes Mother    Stroke Father    Hypertension Father    ADD / ADHD Daughter        ADHD   Thyroid disease Brother    Hypertension Brother    Varicose Veins Brother    Peripheral vascular disease Brother    Heart attack Maternal Grandfather    Alcohol abuse Paternal Grandfather    Breast cancer Maternal Grandmother    Breast cancer Paternal Aunt    Breast cancer Paternal Aunt     Social History   Social History Narrative   Married, currently living in [place].   Occupation: no.  Disabled secondary to chronic pain (osteoarthritis in back and knees).   Exercise: staying busy for 2-3 hours a day but no formal exercise due to chronic pain.   Diet: "normal" diet, trying to increase water.   No Tob/alc/drugs.        Review of Systems General: Denies fevers, chills, weight loss CV: Denies chest pain, shortness of breath, palpitations Complains of diffuse pain and generalized swelling noting that the right side is worse than the left occluding her right arm specifically her right leg at and above the knee.  Physical Exam    01/08/2022    2:52 PM 09/24/2021    4:06 PM 10/23/2020    3:43 PM  Vitals with BMI  Height 5' 4.5" 5' 4.5" 5' 4.5"  Weight 386  lbs 3 oz 389 lbs 6 oz 374 lbs  BMI 65.29 90.24 09.73  Systolic 532 992   Diastolic 89 82   Pulse 426 98     General:  No acute distress,  Alert and oriented, Non-Toxic, Normal speech and affect BMI 65 Right lower extremity: Patient does indeed have a thigh that is larger on the right than on the left however she has fat all over with this being somewhat more prominent.  I do not feel a discrete mass.  Assessment/Plan Obesity: Patient is referred for evaluation of an area on the right thigh above the knee that seems to be more prominent than the rest of her excess skin and fat.  Do not feel a specific isolated mass, for it would be difficult to discern a mass anywhere on her due to her size.  Regardless, she is not a candidate for any type of surgery most especially any type of debulking surgery.  The focus should be should be on assisting the patient with weight loss.  She relates a history of prior bariatric surgery which was successful however she has regained all of the weight plus more.  So she is not a candidate for bariatric surgery.  I do however think that she should discuss the new weight loss medications with her primary care provider.  Additionally she is profoundly deconditioned and would probably benefit from water therapy.  Unfortunately she is unable to get in to any of the pools that she has access to.  I have encouraged her to again discuss with her primary care provider finding a facility  that has a lift that would help her get into the pool so that she can start walking in the water.  In the meantime I have recommended that she at least begin working with weights in her house for her upper extremities to help start building strength in her arms and shoulders.  We discussed how she might accomplish this using cans of vegetables or milk jugs if she did not have access to weights.  Camillia Herter 01/08/2022, 3:43 PM

## 2022-04-10 ENCOUNTER — Encounter (HOSPITAL_BASED_OUTPATIENT_CLINIC_OR_DEPARTMENT_OTHER): Payer: Self-pay | Admitting: Pediatrics

## 2022-04-10 ENCOUNTER — Other Ambulatory Visit: Payer: Self-pay

## 2022-04-10 ENCOUNTER — Inpatient Hospital Stay (HOSPITAL_COMMUNITY)
Admission: EM | Admit: 2022-04-10 | Discharge: 2022-04-17 | DRG: 853 | Disposition: A | Discharge status: Home Health | Payer: Medicare Other | Attending: Internal Medicine | Admitting: Internal Medicine

## 2022-04-10 ENCOUNTER — Emergency Department (HOSPITAL_COMMUNITY): Payer: Medicare Other

## 2022-04-10 ENCOUNTER — Emergency Department (HOSPITAL_BASED_OUTPATIENT_CLINIC_OR_DEPARTMENT_OTHER)
Admission: EM | Admit: 2022-04-10 | Discharge: 2022-04-10 | Discharge status: Against Medical Advice | Payer: Medicare Other | Source: Home / Self Care

## 2022-04-10 DIAGNOSIS — E1142 Type 2 diabetes mellitus with diabetic polyneuropathy: Secondary | ICD-10-CM | POA: Diagnosis present

## 2022-04-10 DIAGNOSIS — Z79890 Hormone replacement therapy (postmenopausal): Secondary | ICD-10-CM

## 2022-04-10 DIAGNOSIS — J9601 Acute respiratory failure with hypoxia: Secondary | ICD-10-CM | POA: Diagnosis present

## 2022-04-10 DIAGNOSIS — M797 Fibromyalgia: Secondary | ICD-10-CM | POA: Diagnosis present

## 2022-04-10 DIAGNOSIS — J45909 Unspecified asthma, uncomplicated: Secondary | ICD-10-CM | POA: Diagnosis present

## 2022-04-10 DIAGNOSIS — Z8249 Family history of ischemic heart disease and other diseases of the circulatory system: Secondary | ICD-10-CM

## 2022-04-10 DIAGNOSIS — Z803 Family history of malignant neoplasm of breast: Secondary | ICD-10-CM

## 2022-04-10 DIAGNOSIS — R0602 Shortness of breath: Secondary | ICD-10-CM | POA: Diagnosis not present

## 2022-04-10 DIAGNOSIS — R109 Unspecified abdominal pain: Secondary | ICD-10-CM | POA: Insufficient documentation

## 2022-04-10 DIAGNOSIS — Z885 Allergy status to narcotic agent status: Secondary | ICD-10-CM

## 2022-04-10 DIAGNOSIS — Z5321 Procedure and treatment not carried out due to patient leaving prior to being seen by health care provider: Secondary | ICD-10-CM | POA: Insufficient documentation

## 2022-04-10 DIAGNOSIS — Z833 Family history of diabetes mellitus: Secondary | ICD-10-CM

## 2022-04-10 DIAGNOSIS — J449 Chronic obstructive pulmonary disease, unspecified: Secondary | ICD-10-CM | POA: Diagnosis present

## 2022-04-10 DIAGNOSIS — Z8349 Family history of other endocrine, nutritional and metabolic diseases: Secondary | ICD-10-CM

## 2022-04-10 DIAGNOSIS — Z6841 Body Mass Index (BMI) 40.0 and over, adult: Secondary | ICD-10-CM

## 2022-04-10 DIAGNOSIS — Z87891 Personal history of nicotine dependence: Secondary | ICD-10-CM

## 2022-04-10 DIAGNOSIS — N201 Calculus of ureter: Principal | ICD-10-CM

## 2022-04-10 DIAGNOSIS — M1712 Unilateral primary osteoarthritis, left knee: Secondary | ICD-10-CM | POA: Diagnosis present

## 2022-04-10 DIAGNOSIS — Z9981 Dependence on supplemental oxygen: Secondary | ICD-10-CM

## 2022-04-10 DIAGNOSIS — E662 Morbid (severe) obesity with alveolar hypoventilation: Secondary | ICD-10-CM | POA: Diagnosis present

## 2022-04-10 DIAGNOSIS — K219 Gastro-esophageal reflux disease without esophagitis: Secondary | ICD-10-CM | POA: Diagnosis present

## 2022-04-10 DIAGNOSIS — I872 Venous insufficiency (chronic) (peripheral): Secondary | ICD-10-CM | POA: Diagnosis present

## 2022-04-10 DIAGNOSIS — I110 Hypertensive heart disease with heart failure: Secondary | ICD-10-CM | POA: Diagnosis present

## 2022-04-10 DIAGNOSIS — J690 Pneumonitis due to inhalation of food and vomit: Secondary | ICD-10-CM | POA: Diagnosis not present

## 2022-04-10 DIAGNOSIS — A4151 Sepsis due to Escherichia coli [E. coli]: Secondary | ICD-10-CM | POA: Diagnosis not present

## 2022-04-10 DIAGNOSIS — J9621 Acute and chronic respiratory failure with hypoxia: Secondary | ICD-10-CM | POA: Diagnosis not present

## 2022-04-10 DIAGNOSIS — N136 Pyonephrosis: Secondary | ICD-10-CM | POA: Diagnosis present

## 2022-04-10 DIAGNOSIS — E039 Hypothyroidism, unspecified: Secondary | ICD-10-CM | POA: Diagnosis present

## 2022-04-10 DIAGNOSIS — Z1152 Encounter for screening for COVID-19: Secondary | ICD-10-CM

## 2022-04-10 DIAGNOSIS — Z91018 Allergy to other foods: Secondary | ICD-10-CM

## 2022-04-10 DIAGNOSIS — N390 Urinary tract infection, site not specified: Secondary | ICD-10-CM

## 2022-04-10 DIAGNOSIS — Z7982 Long term (current) use of aspirin: Secondary | ICD-10-CM

## 2022-04-10 DIAGNOSIS — Z823 Family history of stroke: Secondary | ICD-10-CM

## 2022-04-10 DIAGNOSIS — D509 Iron deficiency anemia, unspecified: Secondary | ICD-10-CM | POA: Diagnosis present

## 2022-04-10 DIAGNOSIS — Z79899 Other long term (current) drug therapy: Secondary | ICD-10-CM

## 2022-04-10 DIAGNOSIS — J9622 Acute and chronic respiratory failure with hypercapnia: Secondary | ICD-10-CM | POA: Diagnosis not present

## 2022-04-10 DIAGNOSIS — I5033 Acute on chronic diastolic (congestive) heart failure: Secondary | ICD-10-CM | POA: Diagnosis not present

## 2022-04-10 DIAGNOSIS — Z86711 Personal history of pulmonary embolism: Secondary | ICD-10-CM

## 2022-04-10 DIAGNOSIS — Z96651 Presence of right artificial knee joint: Secondary | ICD-10-CM | POA: Diagnosis present

## 2022-04-10 LAB — CBC WITH DIFFERENTIAL/PLATELET
Abs Immature Granulocytes: 0.06 10*3/uL (ref 0.00–0.07)
Basophils Absolute: 0 10*3/uL (ref 0.0–0.1)
Basophils Relative: 0 %
Eosinophils Absolute: 0 10*3/uL (ref 0.0–0.5)
Eosinophils Relative: 0 %
HCT: 44.7 % (ref 36.0–46.0)
Hemoglobin: 14.4 g/dL (ref 12.0–15.0)
Immature Granulocytes: 0 %
Lymphocytes Relative: 3 %
Lymphs Abs: 0.5 10*3/uL — ABNORMAL LOW (ref 0.7–4.0)
MCH: 30.6 pg (ref 26.0–34.0)
MCHC: 32.2 g/dL (ref 30.0–36.0)
MCV: 95.1 fL (ref 80.0–100.0)
Monocytes Absolute: 0.2 10*3/uL (ref 0.1–1.0)
Monocytes Relative: 2 %
Neutro Abs: 13.6 10*3/uL — ABNORMAL HIGH (ref 1.7–7.7)
Neutrophils Relative %: 95 %
Platelets: 275 10*3/uL (ref 150–400)
RBC: 4.7 MIL/uL (ref 3.87–5.11)
RDW: 13.9 % (ref 11.5–15.5)
WBC: 14.4 10*3/uL — ABNORMAL HIGH (ref 4.0–10.5)
nRBC: 0 % (ref 0.0–0.2)

## 2022-04-10 NOTE — ED Triage Notes (Addendum)
Per EMS, from home, severe right lower back pain X3 days.  Painful urination, dark urine and increase frequency. There are some questions about her medications.   ST 130HR 168/38 95% on home O2 CBG 142 RR 28, clear lungs sounds  Pt appears very SOB in traige

## 2022-04-10 NOTE — ED Triage Notes (Signed)
C/O right sided flank pain; c/o on and off pain; worst the last couple of days.

## 2022-04-11 ENCOUNTER — Encounter (HOSPITAL_COMMUNITY)
Admission: EM | Disposition: A | Discharge status: Home / Self Care | Payer: Self-pay | Source: Home / Self Care | Attending: Internal Medicine

## 2022-04-11 ENCOUNTER — Inpatient Hospital Stay (HOSPITAL_COMMUNITY): Payer: Medicare Other

## 2022-04-11 ENCOUNTER — Emergency Department (HOSPITAL_COMMUNITY): Payer: Medicare Other

## 2022-04-11 ENCOUNTER — Encounter (HOSPITAL_COMMUNITY): Payer: Self-pay | Admitting: Internal Medicine

## 2022-04-11 ENCOUNTER — Inpatient Hospital Stay (HOSPITAL_COMMUNITY): Payer: Medicare Other | Admitting: Certified Registered Nurse Anesthetist

## 2022-04-11 DIAGNOSIS — M797 Fibromyalgia: Secondary | ICD-10-CM | POA: Diagnosis present

## 2022-04-11 DIAGNOSIS — Z8249 Family history of ischemic heart disease and other diseases of the circulatory system: Secondary | ICD-10-CM | POA: Diagnosis not present

## 2022-04-11 DIAGNOSIS — J690 Pneumonitis due to inhalation of food and vomit: Secondary | ICD-10-CM | POA: Diagnosis not present

## 2022-04-11 DIAGNOSIS — Z7982 Long term (current) use of aspirin: Secondary | ICD-10-CM | POA: Diagnosis not present

## 2022-04-11 DIAGNOSIS — I5033 Acute on chronic diastolic (congestive) heart failure: Secondary | ICD-10-CM

## 2022-04-11 DIAGNOSIS — N201 Calculus of ureter: Secondary | ICD-10-CM | POA: Diagnosis present

## 2022-04-11 DIAGNOSIS — N390 Urinary tract infection, site not specified: Secondary | ICD-10-CM | POA: Diagnosis not present

## 2022-04-11 DIAGNOSIS — Z1152 Encounter for screening for COVID-19: Secondary | ICD-10-CM | POA: Diagnosis not present

## 2022-04-11 DIAGNOSIS — J9621 Acute and chronic respiratory failure with hypoxia: Secondary | ICD-10-CM | POA: Diagnosis not present

## 2022-04-11 DIAGNOSIS — J449 Chronic obstructive pulmonary disease, unspecified: Secondary | ICD-10-CM

## 2022-04-11 DIAGNOSIS — I110 Hypertensive heart disease with heart failure: Secondary | ICD-10-CM | POA: Diagnosis present

## 2022-04-11 DIAGNOSIS — J9601 Acute respiratory failure with hypoxia: Secondary | ICD-10-CM | POA: Diagnosis not present

## 2022-04-11 DIAGNOSIS — R0602 Shortness of breath: Secondary | ICD-10-CM | POA: Diagnosis present

## 2022-04-11 DIAGNOSIS — J9622 Acute and chronic respiratory failure with hypercapnia: Secondary | ICD-10-CM | POA: Diagnosis not present

## 2022-04-11 DIAGNOSIS — J45909 Unspecified asthma, uncomplicated: Secondary | ICD-10-CM | POA: Diagnosis present

## 2022-04-11 DIAGNOSIS — K219 Gastro-esophageal reflux disease without esophagitis: Secondary | ICD-10-CM | POA: Diagnosis present

## 2022-04-11 DIAGNOSIS — Z87891 Personal history of nicotine dependence: Secondary | ICD-10-CM | POA: Diagnosis not present

## 2022-04-11 DIAGNOSIS — Z86711 Personal history of pulmonary embolism: Secondary | ICD-10-CM | POA: Diagnosis not present

## 2022-04-11 DIAGNOSIS — E039 Hypothyroidism, unspecified: Secondary | ICD-10-CM | POA: Diagnosis present

## 2022-04-11 DIAGNOSIS — I10 Essential (primary) hypertension: Secondary | ICD-10-CM

## 2022-04-11 DIAGNOSIS — N132 Hydronephrosis with renal and ureteral calculous obstruction: Secondary | ICD-10-CM

## 2022-04-11 DIAGNOSIS — Z79890 Hormone replacement therapy (postmenopausal): Secondary | ICD-10-CM | POA: Diagnosis not present

## 2022-04-11 DIAGNOSIS — Z79899 Other long term (current) drug therapy: Secondary | ICD-10-CM | POA: Diagnosis not present

## 2022-04-11 DIAGNOSIS — D509 Iron deficiency anemia, unspecified: Secondary | ICD-10-CM | POA: Diagnosis present

## 2022-04-11 DIAGNOSIS — Z6841 Body Mass Index (BMI) 40.0 and over, adult: Secondary | ICD-10-CM | POA: Diagnosis not present

## 2022-04-11 DIAGNOSIS — E662 Morbid (severe) obesity with alveolar hypoventilation: Secondary | ICD-10-CM | POA: Diagnosis present

## 2022-04-11 DIAGNOSIS — N136 Pyonephrosis: Secondary | ICD-10-CM | POA: Diagnosis present

## 2022-04-11 DIAGNOSIS — A4151 Sepsis due to Escherichia coli [E. coli]: Secondary | ICD-10-CM | POA: Diagnosis present

## 2022-04-11 DIAGNOSIS — E1142 Type 2 diabetes mellitus with diabetic polyneuropathy: Secondary | ICD-10-CM | POA: Diagnosis present

## 2022-04-11 DIAGNOSIS — I872 Venous insufficiency (chronic) (peripheral): Secondary | ICD-10-CM | POA: Diagnosis present

## 2022-04-11 HISTORY — PX: CYSTOSCOPY W/ URETERAL STENT PLACEMENT: SHX1429

## 2022-04-11 LAB — CBC WITH DIFFERENTIAL/PLATELET
Abs Immature Granulocytes: 0.22 10*3/uL — ABNORMAL HIGH (ref 0.00–0.07)
Basophils Absolute: 0.1 10*3/uL (ref 0.0–0.1)
Basophils Relative: 0 %
Eosinophils Absolute: 0 10*3/uL (ref 0.0–0.5)
Eosinophils Relative: 0 %
HCT: 41.4 % (ref 36.0–46.0)
Hemoglobin: 12.8 g/dL (ref 12.0–15.0)
Immature Granulocytes: 1 %
Lymphocytes Relative: 3 %
Lymphs Abs: 0.7 10*3/uL (ref 0.7–4.0)
MCH: 30 pg (ref 26.0–34.0)
MCHC: 30.9 g/dL (ref 30.0–36.0)
MCV: 97 fL (ref 80.0–100.0)
Monocytes Absolute: 1.1 10*3/uL — ABNORMAL HIGH (ref 0.1–1.0)
Monocytes Relative: 5 %
Neutro Abs: 22.1 10*3/uL — ABNORMAL HIGH (ref 1.7–7.7)
Neutrophils Relative %: 91 %
Platelets: 237 10*3/uL (ref 150–400)
RBC: 4.27 MIL/uL (ref 3.87–5.11)
RDW: 14.4 % (ref 11.5–15.5)
WBC: 24.2 10*3/uL — ABNORMAL HIGH (ref 4.0–10.5)
nRBC: 0 % (ref 0.0–0.2)

## 2022-04-11 LAB — COMPREHENSIVE METABOLIC PANEL
ALT: 31 U/L (ref 0–44)
AST: 49 U/L — ABNORMAL HIGH (ref 15–41)
Albumin: 4.2 g/dL (ref 3.5–5.0)
Alkaline Phosphatase: 102 U/L (ref 38–126)
Anion gap: 13 (ref 5–15)
BUN: 16 mg/dL (ref 8–23)
CO2: 23 mmol/L (ref 22–32)
Calcium: 9.3 mg/dL (ref 8.9–10.3)
Chloride: 95 mmol/L — ABNORMAL LOW (ref 98–111)
Creatinine, Ser: 0.94 mg/dL (ref 0.44–1.00)
GFR, Estimated: >60 mL/min (ref >60)
Glucose, Bld: 116 mg/dL — ABNORMAL HIGH (ref 70–99)
Potassium: 4 mmol/L (ref 3.5–5.1)
Sodium: 131 mmol/L — ABNORMAL LOW (ref 135–145)
Total Bilirubin: 1.3 mg/dL — ABNORMAL HIGH (ref 0.3–1.2)
Total Protein: 7.6 g/dL (ref 6.5–8.1)

## 2022-04-11 LAB — GLUCOSE, CAPILLARY: Glucose-Capillary: 156 mg/dL — ABNORMAL HIGH (ref 70–99)

## 2022-04-11 LAB — URINALYSIS, ROUTINE W REFLEX MICROSCOPIC
Bilirubin Urine: NEGATIVE
Glucose, UA: NEGATIVE mg/dL
Ketones, ur: NEGATIVE mg/dL
Nitrite: POSITIVE — AB
Protein, ur: 30 mg/dL — AB
Specific Gravity, Urine: 1.016 (ref 1.005–1.030)
pH: 5 (ref 5.0–8.0)

## 2022-04-11 LAB — CBC
HCT: 40.8 % (ref 36.0–46.0)
Hemoglobin: 13 g/dL (ref 12.0–15.0)
MCH: 30.3 pg (ref 26.0–34.0)
MCHC: 31.9 g/dL (ref 30.0–36.0)
MCV: 95.1 fL (ref 80.0–100.0)
Platelets: 235 10*3/uL (ref 150–400)
RBC: 4.29 MIL/uL (ref 3.87–5.11)
RDW: 14.4 % (ref 11.5–15.5)
WBC: 24.7 10*3/uL — ABNORMAL HIGH (ref 4.0–10.5)
nRBC: 0 % (ref 0.0–0.2)

## 2022-04-11 LAB — HIV ANTIBODY (ROUTINE TESTING W REFLEX): HIV Screen 4th Generation wRfx: NONREACTIVE

## 2022-04-11 LAB — BASIC METABOLIC PANEL
Anion gap: 12 (ref 5–15)
BUN: 14 mg/dL (ref 8–23)
CO2: 25 mmol/L (ref 22–32)
Calcium: 8.8 mg/dL — ABNORMAL LOW (ref 8.9–10.3)
Chloride: 97 mmol/L — ABNORMAL LOW (ref 98–111)
Creatinine, Ser: 0.99 mg/dL (ref 0.44–1.00)
GFR, Estimated: >60 mL/min (ref >60)
Glucose, Bld: 155 mg/dL — ABNORMAL HIGH (ref 70–99)
Potassium: 4.4 mmol/L (ref 3.5–5.1)
Sodium: 134 mmol/L — ABNORMAL LOW (ref 135–145)

## 2022-04-11 LAB — MAGNESIUM: Magnesium: 1.8 mg/dL (ref 1.7–2.4)

## 2022-04-11 LAB — RESP PANEL BY RT-PCR (RSV, FLU A&B, COVID)  RVPGX2
Influenza A by PCR: NEGATIVE
Influenza B by PCR: NEGATIVE
Resp Syncytial Virus by PCR: NEGATIVE
SARS Coronavirus 2 by RT PCR: NEGATIVE

## 2022-04-11 LAB — PHOSPHORUS: Phosphorus: 4.3 mg/dL (ref 2.5–4.6)

## 2022-04-11 SURGERY — CYSTOSCOPY, WITH RETROGRADE PYELOGRAM AND URETERAL STENT INSERTION
Anesthesia: General | Laterality: Right

## 2022-04-11 MED ORDER — OXYCODONE HCL 5 MG PO TABS
5.0000 mg | ORAL_TABLET | Freq: Four times a day (QID) | ORAL | Status: DC | PRN
  Administered 2022-04-11 – 2022-04-17 (×11): 5 mg via ORAL
  Filled 2022-04-11 (×12): qty 1

## 2022-04-11 MED ORDER — ALBUTEROL SULFATE (2.5 MG/3ML) 0.083% IN NEBU
INHALATION_SOLUTION | RESPIRATORY_TRACT | Status: AC
  Filled 2022-04-11: qty 3

## 2022-04-11 MED ORDER — IPRATROPIUM-ALBUTEROL 0.5-2.5 (3) MG/3ML IN SOLN
3.0000 mL | Freq: Three times a day (TID) | RESPIRATORY_TRACT | Status: DC
  Administered 2022-04-11 – 2022-04-15 (×10): 3 mL via RESPIRATORY_TRACT
  Filled 2022-04-11 (×10): qty 3

## 2022-04-11 MED ORDER — ALBUTEROL SULFATE (2.5 MG/3ML) 0.083% IN NEBU
2.5000 mg | INHALATION_SOLUTION | RESPIRATORY_TRACT | Status: DC
  Administered 2022-04-11: 2.5 mg via RESPIRATORY_TRACT
  Filled 2022-04-11: qty 3

## 2022-04-11 MED ORDER — HYDROMORPHONE HCL 1 MG/ML IJ SOLN
0.5000 mg | INTRAMUSCULAR | Status: AC | PRN
  Administered 2022-04-12 – 2022-04-14 (×4): 0.5 mg via INTRAVENOUS
  Filled 2022-04-11 (×4): qty 0.5

## 2022-04-11 MED ORDER — IOHEXOL 300 MG/ML  SOLN
INTRAMUSCULAR | Status: DC | PRN
  Administered 2022-04-11: 25 mL via URETHRAL

## 2022-04-11 MED ORDER — LEVOTHYROXINE SODIUM 100 MCG PO TABS
200.0000 ug | ORAL_TABLET | Freq: Every day | ORAL | Status: DC
  Administered 2022-04-12 – 2022-04-17 (×6): 200 ug via ORAL
  Filled 2022-04-11 (×6): qty 2

## 2022-04-11 MED ORDER — FUROSEMIDE 10 MG/ML IJ SOLN
20.0000 mg | Freq: Once | INTRAMUSCULAR | Status: AC
  Administered 2022-04-11: 20 mg via INTRAVENOUS
  Filled 2022-04-11: qty 2

## 2022-04-11 MED ORDER — FENTANYL CITRATE (PF) 100 MCG/2ML IJ SOLN
INTRAMUSCULAR | Status: AC
  Filled 2022-04-11: qty 2

## 2022-04-11 MED ORDER — PROPOFOL 10 MG/ML IV BOLUS
INTRAVENOUS | Status: AC
  Filled 2022-04-11: qty 20

## 2022-04-11 MED ORDER — SODIUM CHLORIDE 0.9 % IV BOLUS: 1000.0000 mL | Freq: Once | INTRAVENOUS | Status: DC

## 2022-04-11 MED ORDER — ONDANSETRON HCL 4 MG/2ML IJ SOLN
4.0000 mg | Freq: Once | INTRAMUSCULAR | Status: AC | PRN
  Administered 2022-04-11: 4 mg via INTRAVENOUS
  Filled 2022-04-11: qty 2

## 2022-04-11 MED ORDER — ONDANSETRON HCL 4 MG/2ML IJ SOLN
INTRAMUSCULAR | Status: DC | PRN
  Administered 2022-04-11: 4 mg via INTRAVENOUS

## 2022-04-11 MED ORDER — ONDANSETRON HCL 4 MG/2ML IJ SOLN
INTRAMUSCULAR | Status: AC
  Filled 2022-04-11: qty 2

## 2022-04-11 MED ORDER — OXYCODONE HCL 5 MG PO TABS: 5.0000 mg | ORAL_TABLET | Freq: Once | ORAL | Status: DC | PRN

## 2022-04-11 MED ORDER — ALBUMIN HUMAN 5 % IV SOLN: INTRAVENOUS | Status: DC | PRN

## 2022-04-11 MED ORDER — LIDOCAINE HCL (CARDIAC) PF 100 MG/5ML IV SOSY
PREFILLED_SYRINGE | INTRAVENOUS | Status: DC | PRN
  Administered 2022-04-11: 40 mg via INTRAVENOUS

## 2022-04-11 MED ORDER — LACTATED RINGERS IV SOLN: INTRAVENOUS | Status: DC

## 2022-04-11 MED ORDER — PROMETHAZINE HCL 25 MG/ML IJ SOLN: 6.2500 mg | INTRAMUSCULAR | Status: DC | PRN

## 2022-04-11 MED ORDER — KETOROLAC TROMETHAMINE 15 MG/ML IJ SOLN
15.0000 mg | Freq: Once | INTRAMUSCULAR | Status: AC
  Administered 2022-04-11: 15 mg via INTRAVENOUS
  Filled 2022-04-11: qty 1

## 2022-04-11 MED ORDER — OXYCODONE HCL 5 MG/5ML PO SOLN: 5.0000 mg | Freq: Once | ORAL | Status: DC | PRN

## 2022-04-11 MED ORDER — SODIUM CHLORIDE 0.9 % IV SOLN
2.0000 g | INTRAVENOUS | Status: DC
  Administered 2022-04-11 – 2022-04-16 (×6): 2 g via INTRAVENOUS
  Filled 2022-04-11 (×7): qty 20

## 2022-04-11 MED ORDER — SENNOSIDES-DOCUSATE SODIUM 8.6-50 MG PO TABS
1.0000 | ORAL_TABLET | Freq: Every day | ORAL | Status: DC
  Administered 2022-04-11 – 2022-04-15 (×5): 1 via ORAL
  Filled 2022-04-11 (×6): qty 1

## 2022-04-11 MED ORDER — FENTANYL CITRATE (PF) 250 MCG/5ML IJ SOLN
INTRAMUSCULAR | Status: AC
  Filled 2022-04-11: qty 5

## 2022-04-11 MED ORDER — SUGAMMADEX SODIUM 200 MG/2ML IV SOLN
INTRAVENOUS | Status: DC | PRN
  Administered 2022-04-11: 300 mg via INTRAVENOUS
  Administered 2022-04-11: 100 mg via INTRAVENOUS

## 2022-04-11 MED ORDER — SODIUM CHLORIDE 0.9 % IR SOLN
Status: DC | PRN
  Administered 2022-04-11: 3000 mL

## 2022-04-11 MED ORDER — POLYETHYLENE GLYCOL 3350 17 G PO PACK: 17.0000 g | PACK | Freq: Every day | ORAL | Status: DC | PRN

## 2022-04-11 MED ORDER — ORAL CARE MOUTH RINSE: 15.0000 mL | OROMUCOSAL | Status: DC | PRN

## 2022-04-11 MED ORDER — SODIUM CHLORIDE 0.9 % IV SOLN
1.0000 g | Freq: Once | INTRAVENOUS | Status: AC
  Administered 2022-04-11: 1 g via INTRAVENOUS
  Filled 2022-04-11: qty 10

## 2022-04-11 MED ORDER — PHENYLEPHRINE HCL (PRESSORS) 10 MG/ML IV SOLN
INTRAVENOUS | Status: DC | PRN
  Administered 2022-04-11 (×4): 160 ug via INTRAVENOUS

## 2022-04-11 MED ORDER — ROCURONIUM BROMIDE 100 MG/10ML IV SOLN
INTRAVENOUS | Status: DC | PRN
  Administered 2022-04-11: 30 mg via INTRAVENOUS

## 2022-04-11 MED ORDER — FERROUS SULFATE 325 (65 FE) MG PO TABS
325.0000 mg | ORAL_TABLET | Freq: Every day | ORAL | Status: DC
  Administered 2022-04-12 – 2022-04-17 (×6): 325 mg via ORAL
  Filled 2022-04-11 (×7): qty 1

## 2022-04-11 MED ORDER — FUROSEMIDE 10 MG/ML IJ SOLN
INTRAMUSCULAR | Status: AC
  Filled 2022-04-11: qty 4

## 2022-04-11 MED ORDER — LACTATED RINGERS IV SOLN: INTRAVENOUS | Status: DC | PRN

## 2022-04-11 MED ORDER — SUCCINYLCHOLINE CHLORIDE 200 MG/10ML IV SOSY
PREFILLED_SYRINGE | INTRAVENOUS | Status: DC | PRN
  Administered 2022-04-11: 200 mg via INTRAVENOUS

## 2022-04-11 MED ORDER — ENOXAPARIN SODIUM 40 MG/0.4ML IJ SOSY
40.0000 mg | PREFILLED_SYRINGE | Freq: Every day | INTRAMUSCULAR | Status: DC
  Administered 2022-04-11 – 2022-04-13 (×3): 40 mg via SUBCUTANEOUS
  Filled 2022-04-11 (×3): qty 0.4

## 2022-04-11 MED ORDER — LACTATED RINGERS IV SOLN: INTRAVENOUS | Status: AC

## 2022-04-11 MED ORDER — ACETAMINOPHEN 500 MG PO TABS
1000.0000 mg | ORAL_TABLET | Freq: Once | ORAL | Status: AC
  Administered 2022-04-11: 1000 mg via ORAL
  Filled 2022-04-11: qty 2

## 2022-04-11 MED ORDER — DEXAMETHASONE SODIUM PHOSPHATE 10 MG/ML IJ SOLN
INTRAMUSCULAR | Status: DC | PRN
  Administered 2022-04-11: 10 mg via INTRAVENOUS

## 2022-04-11 MED ORDER — FENTANYL CITRATE (PF) 100 MCG/2ML IJ SOLN
INTRAMUSCULAR | Status: DC | PRN
  Administered 2022-04-11: 25 ug via INTRAVENOUS

## 2022-04-11 MED ORDER — IPRATROPIUM-ALBUTEROL 0.5-2.5 (3) MG/3ML IN SOLN
3.0000 mL | Freq: Three times a day (TID) | RESPIRATORY_TRACT | Status: DC
  Administered 2022-04-11: 3 mL via RESPIRATORY_TRACT
  Filled 2022-04-11: qty 3

## 2022-04-11 MED ORDER — ALBUTEROL SULFATE (2.5 MG/3ML) 0.083% IN NEBU
2.5000 mg | INHALATION_SOLUTION | Freq: Four times a day (QID) | RESPIRATORY_TRACT | Status: DC | PRN
  Administered 2022-04-11: 2.5 mg via RESPIRATORY_TRACT

## 2022-04-11 MED ORDER — MIDAZOLAM HCL 2 MG/2ML IJ SOLN: 0.5000 mg | Freq: Once | INTRAMUSCULAR | Status: DC | PRN

## 2022-04-11 MED ORDER — ACETAMINOPHEN 325 MG PO TABS
650.0000 mg | ORAL_TABLET | Freq: Four times a day (QID) | ORAL | Status: DC | PRN
  Administered 2022-04-16: 650 mg via ORAL
  Filled 2022-04-11: qty 2

## 2022-04-11 MED ORDER — MONTELUKAST SODIUM 10 MG PO TABS
10.0000 mg | ORAL_TABLET | Freq: Every day | ORAL | Status: DC
  Administered 2022-04-11 – 2022-04-17 (×7): 10 mg via ORAL
  Filled 2022-04-11 (×7): qty 1

## 2022-04-11 MED ORDER — FLUTICASONE FUROATE-VILANTEROL 200-25 MCG/ACT IN AEPB
1.0000 | INHALATION_SPRAY | Freq: Every day | RESPIRATORY_TRACT | Status: DC
  Administered 2022-04-12 – 2022-04-17 (×6): 1 via RESPIRATORY_TRACT
  Filled 2022-04-11: qty 28

## 2022-04-11 MED ORDER — CHLORHEXIDINE GLUCONATE CLOTH 2 % EX PADS
6.0000 | MEDICATED_PAD | Freq: Every day | CUTANEOUS | Status: DC
  Administered 2022-04-11 – 2022-04-17 (×7): 6 via TOPICAL

## 2022-04-11 MED ORDER — SODIUM CHLORIDE 0.9 % IV BOLUS
500.0000 mL | Freq: Once | INTRAVENOUS | Status: AC
  Administered 2022-04-11: 500 mL via INTRAVENOUS

## 2022-04-11 MED ORDER — 0.9 % SODIUM CHLORIDE (POUR BTL) OPTIME
TOPICAL | Status: DC | PRN
  Administered 2022-04-11: 1000 mL

## 2022-04-11 MED ORDER — SUCCINYLCHOLINE CHLORIDE 200 MG/10ML IV SOSY
PREFILLED_SYRINGE | INTRAVENOUS | Status: AC
  Filled 2022-04-11: qty 10

## 2022-04-11 MED ORDER — FUROSEMIDE 10 MG/ML IJ SOLN
40.0000 mg | Freq: Once | INTRAMUSCULAR | Status: AC
  Administered 2022-04-11: 40 mg via INTRAVENOUS
  Filled 2022-04-11: qty 4

## 2022-04-11 MED ORDER — FENTANYL CITRATE (PF) 100 MCG/2ML IJ SOLN
25.0000 ug | INTRAMUSCULAR | Status: DC | PRN
  Administered 2022-04-11: 25 ug via INTRAVENOUS

## 2022-04-11 MED ORDER — PROPOFOL 10 MG/ML IV BOLUS
INTRAVENOUS | Status: DC | PRN
  Administered 2022-04-11: 200 mg via INTRAVENOUS

## 2022-04-11 SURGICAL SUPPLY — 26 items
BAG DRN RND TRDRP ANRFLXCHMBR (UROLOGICAL SUPPLIES) ×1
BAG URINE DRAIN 2000ML AR STRL (UROLOGICAL SUPPLIES) ×2 IMPLANT
BAG URO CATCHER STRL LF (MISCELLANEOUS) ×2 IMPLANT
CATH FOLEY 2WAY SLVR  5CC 16FR (CATHETERS) ×1
CATH FOLEY 2WAY SLVR 5CC 16FR (CATHETERS) IMPLANT
CATH URETL OPEN END 6FR 70 (CATHETERS) ×2 IMPLANT
GLOVE BIO SURGEON STRL SZ7 (GLOVE) IMPLANT
GLOVE BIO SURGEON STRL SZ7.5 (GLOVE) IMPLANT
GLOVE BIOGEL M STRL SZ7.5 (GLOVE) ×2 IMPLANT
GLOVE BIOGEL PI IND STRL 7.0 (GLOVE) IMPLANT
GOWN STRL REUS W/ TWL LRG LVL3 (GOWN DISPOSABLE) ×2 IMPLANT
GOWN STRL REUS W/ TWL XL LVL3 (GOWN DISPOSABLE) ×2 IMPLANT
GOWN STRL REUS W/TWL LRG LVL3 (GOWN DISPOSABLE) ×1
GOWN STRL REUS W/TWL XL LVL3 (GOWN DISPOSABLE) ×1
GUIDEWIRE ANG ZIPWIRE 038X150 (WIRE) IMPLANT
GUIDEWIRE STR DUAL SENSOR (WIRE) IMPLANT
KIT TURNOVER KIT B (KITS) ×2 IMPLANT
MANIFOLD NEPTUNE II (INSTRUMENTS) IMPLANT
NS IRRIG 1000ML POUR BTL (IV SOLUTION) IMPLANT
PACK CYSTO (CUSTOM PROCEDURE TRAY) ×2 IMPLANT
STENT URET 6FRX24 CONTOUR (STENTS) IMPLANT
STENT URET 6FRX26 CONTOUR (STENTS) IMPLANT
SYPHON OMNI JUG (MISCELLANEOUS) ×2 IMPLANT
TOWEL GREEN STERILE FF (TOWEL DISPOSABLE) ×2 IMPLANT
TUBE CONNECTING 12X1/4 (SUCTIONS) IMPLANT
WATER STERILE IRR 3000ML UROMA (IV SOLUTION) ×2 IMPLANT

## 2022-04-11 NOTE — H&P (Addendum)
History and Physical  Tricia Perry NTI:[id number] DOB: May 30, 1955 DOA: 04/10/2022  Referring physician: Dr. Leonette Monarch, [place]. PCP: [name], PA-C  Outpatient Specialists: Plastic surgery, urology, cardiology, general surgery. Patient coming from: Home.  Chief Complaint: Right flank pain  HPI: Tricia Perry is a 67 y.o. female with medical history significant for severe morbid obesity, chronic diastolic CHF, chronic hypoxic and hypercarbic respiratory failure on 2.5 L/trilogy ventilator nightly, who presented to [facility] due to worsening left flank pain.  Associated with nausea without vomiting.  No subjective fevers.  Reports exertional dyspnea which is chronic.  In the ED, UA positive for pyuria, CT renal stone positive for 1 cm stone in the right ureteropelvic junction with moderate right hydronephrosis, nonspecific edema in the anterior abdominal wall pannus with skin thickening.  UA positive.  Urine culture ordered.  Empiric IV antibiotics Rocephin initiated.  EDP discussed the case with urology, Dr. Milford Cage, who will arrange operative management and follow in consultation.  ED Course: Tmax 98.8.  BP 110/64, pulse 109, respiratory 25, saturation 95% on 2 L [place].  Lab studies remarkable for sodium 131, glucose 116, AST 49, total bilirubin 1.3.  WBC 14.4.  Review of Systems: Review of systems as noted in the HPI. All other systems reviewed and are negative.   Past Medical History:  Diagnosis Date   Arthritis    Knees; right-TKR, left-bone on bone/end stage   Asthma    Carpal tunnel syndrome on both sides 06/21/2010   Carpal tunnel syndrome, bilateral    Chronic nonallergic rhinitis    allergy eval NEG 10/2014: azelastine and flonase spray rx'd   Chronic venous insufficiency    with edema  and extensive varicose dz: Dr. Linus Mako is managing this, planning laser procedures.; A LOT OF LEG CRAMPS AND LEG STIFFNESS   Complication of anesthesia    STATES SHE WAS TOLD SHE WOKE UP  DURING HER KNEE REPLACEMENT SURGERY AND HAD TO BE GIVEN MORE MEDICINE - NOT SURE IF SPINAL OR GENERAL ANESTHESIA   DDD (degenerative disc disease)    Dr. [name] evaluated her and recommended pain mgmt MD.  She saw Dr. Selinda Orion for pain mgmt at one time.   Diabetes mellitus without complication ([place])    Type II   Disequilibrium syndrome    MRI brain normal 2012   DIZZINESS 04/12/2010   Qualifier: Diagnosis of  By: Anitra Lauth M.D., Brien Few    Fibromyalgia    questionable   GERD (gastroesophageal reflux disease)    Heart murmur    functional, w/u done; PALPITATIONS IN THE PAST   History of bronchitis    History of palpitations    History of pulmonary embolism 05/2013   Right sided.  Setting: post-op percutaneous nephrolithotomy--xarelto x 6 mo   Hypertension    Hypothyroidism    Insomnia    Insulin resistance    Kidney stone on right side 12/2014   Dr. Jeffie Pollock considering PCNL, ESWL, and ureteroscopy as of 01/25/15    Microscopic hematuria 2014   CT abd pelv showed 1.8 cm right renal pelvis stone.  Also had UA c/w UTI so abx given.    Mixed incontinence urge and stress    and nocturia x 5-6 per night   Morbid obesity (HCC)    BMI 49.  Gastric stapling surgery in distant past.   Nephrolithiasis    Lithotripsy 2002.  Right percut nephrolith 05/2013.   Nocturia    Numbness and tingling    bilateral hands and  feet   OAB (overactive bladder)    Per Dr. Jeffie Pollock: pt declined pelvic floor PT.  Vesicare trial started 01/25/15.   Obesity hypoventilation syndrome (HCC)    Peripheral neuropathy    No old records to confirm pt's report.  Pt refuses to have more NCS b/c test is painful.   PONV (postoperative nausea and vomiting)    Shortness of breath    CHRONIC; 3 mo trial off of ACE-I made no difference.  Spirometry x 2 has shown no obstruction.  Allergy w/u NEG.   Shoulder pain    HX OF BILATERAL SHOULDER SURGERIES - PT HAS VERY LIMITED ROM - ESPECIALLY RAISING HER ARMS   Sleep apnea    PT  DOES NOT HAVE MASK OR TUBING - JUST SLEEPS WITH HEAD ELEVATED ON 2 PILLOWS    Spinal stenosis    with L5 radiculopathy, also pseudospondylolisthesis per Dr. [name]   Superficial thrombophlebitis of left leg 05/2013   with cellulitis--resolved approp with abx and ice   Urinary incontinence    Urinary urgency    Vertigo    Past Surgical History:  Procedure Laterality Date   BILATERAL SHOULDER SURGERY     BREAST EXCISIONAL BIOPSY Left 11/12/2015   BREAST LUMPECTOMY WITH RADIOACTIVE SEED LOCALIZATION Left 11/12/2015   Procedure: LEFT BREAST LUMPECTOMY WITH RADIOACTIVE SEED LOCALIZATION;  Surgeon: Autumn Messing III, MD;  Location: [facility];  Service: General;  Laterality: Left;   CHOLECYSTECTOMY  1987   ENDOMETRIAL ABLATION     menorrhagia   EXTRACORPOREAL SHOCK WAVE LITHOTRIPSY     FOOT SURGERY  2000   Right tarsel tunnel release   GASTRIC RESTRICTION SURGERY  remote past   HERNIA REPAIR   1980's   Diaphragmatic + gastric stapling   HERNIA REPAIR     Inguinal   JOINT REPLACEMENT  2009   Right Knee, [place]   NEPHROLITHOTOMY Right 05/31/2013   Procedure: NEPHROLITHOTOMY PERCUTANEOUS;  Surgeon: Irine Seal, MD;  Location: [facility];  Service: Urology;  Laterality: Right;   PFT's  09/2013; 12/2013   09/2013 mild restriction.  12/2013 Low vital capacity, possibly due to restriction from pt's body habitus.   TRANSTHORACIC ECHOCARDIOGRAM  12/16/13   EF 01-60%, grade 2 diastolic dysfxn, PA pressure 37 (mildly high)    Social History:  reports that she quit smoking about 24 years ago. Her smoking use included cigarettes. She started smoking about 45 years ago. She has a 3.00 pack-year smoking history. She has never used smokeless tobacco. She reports current alcohol use. She reports that she does not use drugs.   Allergies  Allergen Reactions   Gabapentin Swelling    Legs    Mold Extract [Trichophyton] Nausea And Vomiting and Other (See Comments)    Sneezing real bad    Morphine Itching and Other  (See Comments)    back hurts   Codeine Itching and Other (See Comments)    Back hurts    Family History  Problem Relation Age of Onset   Hypertension Mother    Cancer Mother        Brain/Lung/ smoker   Diabetes Mother    Stroke Father    Hypertension Father    ADD / ADHD Daughter        ADHD   Thyroid disease Brother    Hypertension Brother    Varicose Veins Brother    Peripheral vascular disease Brother    Heart attack Maternal Grandfather    Alcohol abuse Paternal [employer]chant [employer] officer  Breast cancer Maternal Grandmother    Breast cancer Paternal Aunt    Breast cancer Paternal Aunt       Prior to Admission medications   Medication Sig Start Date End Date Taking? Authorizing Provider  acetaminophen (TYLENOL) 325 MG tablet Take 650 mg by mouth every 6 (six) hours as needed for mild pain.    [provider]  albuterol (PROVENTIL) (2.5 MG/3ML) 0.083% nebulizer solution Take 3 mLs (2.5 mg total) by nebulization every 6 (six) hours as needed for wheezing or shortness of breath. 06/13/20   Martyn Ehrich, NP  albuterol (VENTOLIN HFA) 108 (90 Base) MCG/ACT inhaler albuterol sulfate HFA 90 mcg/actuation aerosol inhaler  INL 2 PFS PO Q 4 TO 6 H PRF WHZ OR SOB    [provider]  amLODipine (NORVASC) 5 MG tablet Take 5 mg by mouth daily. 02/14/20   [provider]  aspirin EC 81 MG tablet Take 81 mg by mouth daily. Swallow whole.    [provider]  cholecalciferol (VITAMIN D3) 25 MCG (1000 UNIT) tablet Take 1,000 Units by mouth daily.    [provider]  Ferrous Sulfate (IRON PO) Take 1 tablet by mouth daily.    [provider]  furosemide (LASIX) 20 MG tablet TAKE 1 TABLET(20 MG) BY MOUTH DAILY 09/11/20   Margaretha Seeds, MD  levothyroxine (SYNTHROID, LEVOTHROID) 200 MCG tablet TAKE ONE TABLET BY MOUTH ONE TIME DAILY before breakfast 11/07/14   McGowen, Adrian Blackwater, MD  losartan (COZAAR) 100 MG tablet Take 100 mg by mouth daily. 02/14/20    [provider]  montelukast (SINGULAIR) 10 MG tablet Take 10 mg by mouth in the morning.    [provider]  OXYGEN Inhale 2.5 L into the lungs continuous.    [provider]  VITAMIN A PO Take 2 capsules by mouth daily.    [provider]  vitamin C (ASCORBIC ACID) 500 MG tablet Take 500 mg by mouth daily.    [provider]  Vitamin D, Ergocalciferol, (DRISDOL) 1.25 MG (50000 UNIT) CAPS capsule Take 50,000 Units by mouth once a week. Take on Thursdays 04/12/20   [provider]    Physical Exam: BP 110/64   Pulse (!) 109   Temp 98.8 F (37.1 C) (Oral)   Resp (!) 25   Ht '5\' 4"'$  (1.626 m)   Wt (!) 140.6 kg   SpO2 95%   BMI 53.21 kg/m   General: 67 y.o. year-old female well developed well nourished in no acute distress.  Alert and oriented x3. Cardiovascular: Regular rate and rhythm with no rubs or gallops.  No thyromegaly or JVD noted.  No lower extremity edema. 2/4 pulses in all 4 extremities. Respiratory: Clear to auscultation with no wheezes or rales. Good inspiratory effort. Abdomen: Left flank pain.  Abdomen is nontender.  Bowel sounds present.   Muskuloskeletal: No cyanosis, clubbing or edema noted bilaterally Neuro: CN II-XII intact, strength, sensation, reflexes Skin: No ulcerative lesions noted or rashes Psychiatry: Judgement and insight appear normal. Mood is appropriate for condition and setting          Labs on Admission:  Basic Metabolic Panel: Recent Labs  Lab 04/10/22 2330  NA 131*  K 4.0  CL 95*  CO2 23  GLUCOSE 116*  BUN 16  CREATININE 0.94  CALCIUM 9.3   Liver Function Tests: Recent Labs  Lab 04/10/22 2330  AST 49*  ALT 31  ALKPHOS 102  BILITOT 1.3*  PROT  7.6  ALBUMIN 4.2   No results for input(s): "LIPASE", "AMYLASE" in the last 168 hours. No results for input(s): "AMMONIA" in the last 168 hours. CBC: Recent Labs  Lab 04/10/22 2330  WBC 14.4*  NEUTROABS 13.6*  HGB 14.4  HCT 44.7   MCV 95.1  PLT 275   Cardiac Enzymes: No results for input(s): "CKTOTAL", "CKMB", "CKMBINDEX", "TROPONINI" in the last 168 hours.  BNP (last 3 results) No results for input(s): "BNP" in the last 8760 hours.  ProBNP (last 3 results) No results for input(s): "PROBNP" in the last 8760 hours.  CBG: No results for input(s): "GLUCAP" in the last 168 hours.  Radiological Exams on Admission: CT Renal Stone Study  Result Date: 04/11/2022 CLINICAL DATA:  Severe right lower back pain for 3 days. Painful urination with dark urine and increased frequency. EXAM: CT ABDOMEN AND PELVIS WITHOUT CONTRAST TECHNIQUE: Multidetector CT imaging of the abdomen and pelvis was performed following the standard protocol without IV contrast. RADIATION DOSE REDUCTION: This exam was performed according to the departmental dose-optimization program which includes automated exposure control, adjustment of the mA and/or kV according to patient size and/or use of iterative reconstruction technique. COMPARISON:  01/02/2021 FINDINGS: Lower chest: No acute abnormality. Hepatobiliary: No focal liver abnormality is seen. Status post cholecystectomy. No biliary dilatation. Pancreas: Unremarkable. No pancreatic ductal dilatation or surrounding inflammatory changes. Spleen: Normal in size without focal abnormality. Adrenals/Urinary Tract: Normal adrenal glands bilateral cortical renal scarring greater on the right parenchymal calcifications versus nonobstructing calyceal stones in the upper pole of the right kidney. 1.0 cm stone in the right ureteropelvic junction with upstream moderate hydroureteronephrosis nephrosis nephrosis. Asymmetric stranding about the right kidney. The left kidney contains no urinary calculi. No left hydronephrosis. Unremarkable bladder. Stomach/Bowel: Colonic diverticulosis without diverticulitis. Normal caliber large and small bowel. Postoperative change of sleeve gastrectomy. Vascular/Lymphatic: Aortic  atherosclerosis. No enlarged abdominal or pelvic lymph nodes. Reproductive: Uterus and bilateral adnexa are unremarkable. Other: No free intraperitoneal fluid or air Musculoskeletal: Nonspecific edema within the anterior abdominal wall pannus with skin thickening. Thoracolumbar spondylosis. IMPRESSION: Obstructing 1.0 cm stone in the right ureteropelvic junction with moderate right hydronephrosis. Nonspecific edema with the in the anterior abdominal wall pannus with skin thickening. Electronically Signed   By: Placido Sou M.D.   On: 04/11/2022 04:00   DG Chest 2 View  Result Date: 04/10/2022 CLINICAL DATA:  Shortness of breath. EXAM: CHEST - 2 VIEW COMPARISON:  June 13, 2020 FINDINGS: There is stable mild to moderate severity enlargement of the cardiac silhouette. Prominent pericardial fat is seen on the right. Mildly increased lung markings are seen with mild areas of atelectasis and/or infiltrate noted within the bilateral lung bases. There is no evidence of a pleural effusion or pneumothorax. Radiopaque surgical clips are seen within the right upper quadrant. Multilevel degenerative changes seen throughout the thoracic spine. IMPRESSION: Mild bibasilar atelectasis and/or infiltrate. Electronically Signed   By: Virgina Norfolk M.D.   On: 04/10/2022 23:50    EKG: I independently viewed the EKG done and my findings are as followed: None available at the time for this visit.  Assessment/Plan Present on Admission:  Obstruction of right ureteropelvic junction (UPJ) due to stone  Principal Problem:   Obstruction of right ureteropelvic junction (UPJ) due to stone  Obstruction of right ureteropelvic junction due to stone, seen on CT scan Urology consulted, will arrange for operative management. Pain management and bowel regimen are in place.  Sepsis secondary to presumptive UTI, POA Presented  with leukocytosis 14.4, tachycardia 115, tachypnea 25, UA positive for pyuria. Started on Rocephin,  continue. Judicious IV fluid hydration LR 50 cc/h x 4 hours Maintain MAP greater than 65 Follow urine culture for ID and sensitivities.   Narrow down antibiotics as able. Monitor fever curve and WBC.  Chronic diastolic CHF Last 2D echo LVEF 60 to 65% on 04/30/2020. Euvolemic on exam Hold off home diuretics for now Closely monitor volume status while on IV fluid Start strict I's and O's and daily weight  Hypertension, BPs are soft Hold off home oral antihypertensives for now. Continue to monitor vital signs.  Hypothyroidism Resume home levothyroxine  Iron deficiency anemia Resume home ferrous sulfate  Severe morbid obesity BMI 53 Recommend weight loss outpatient with regular physical activity and healthy dieting.    DVT prophylaxis: Subcu Lovenox to start on 04/11/2022 at 2200  Code Status: Full code  Family Communication: Updated the patient's spouse via phone.  Disposition Plan: Admitted to telemetry surgical unit  Consults called: Urology consulted by ED.  Admission status: Inpatient status.   Status is: Inpatient The patient requires at least 2 midnights for further evaluation and treatment of present condition.   Kayleen Memos MD [facility] Pager [phone]  If 7PM-7AM, please contact night-coverage [email or web address] Password [facility]  04/11/2022, 4:58 AM

## 2022-04-11 NOTE — Progress Notes (Addendum)
PROGRESS NOTE        PATIENT DETAILS Name: Tricia Perry Age: 67 y.o. Sex: female Date of Birth: 09-04-1955 Admit Date: 04/10/2022 Admitting Physician Evalee Mutton Kristeen Mans, MD OQH:[id number], Truddie Crumble, PA-C  Brief Summary: Patient is a 67 y.o.  female with history of chronic hypoxic/hypercarbic respiratory failure on around 3 L of oxygen at home-trilogy machine at night, chronic HFpEF, morbidly obese who presented with right flank pain-was found to have right UPJ junction stone with obstruction causing complicated UTI with sepsis physiology.  Significant events: 1/11>> admit to [facility]-right UPJ stone with hydronephrosis-sepsis/complicated UTI 0/35>> cystoscopy/right JJ stent placement.  Postoperatively worsening hypoxemia-briefly required BiPAP-given Lasix-transitioned to 5 L of oxygen.  Significant studies: 1/12>> CT renal stone study: Obstructing 1.0 cm stone right UPJ junction with moderate right hydronephrosis.  Significant microbiology data: 1/12>> urine culture: Pending 1/12>> blood culture: Pending  Procedures: 1/12>>Cystoscopy, right retrograde pyelogram with intraoperative interpretation, insertion right JJ stent   Consults: Urology  Subjective: Seen earlier this morning while in the [place] liberated off the BiPAP-on 5 L of oxygen-not in distress.  Objective: Vitals: Blood pressure 131/70, pulse (!) 102, temperature 97.8 F (36.6 C), temperature source Oral, resp. rate (!) 26, height '5\' 4"'$  (1.626 m), weight (!) 140.6 kg, SpO2 92 %.   Exam: Gen Exam:Alert awake-not in any distress HEENT:atraumatic, normocephalic Chest: B/L clear to auscultation anteriorly CVS:S1S2 regular Abdomen:soft non tender, non distended Extremities:++ edema-lymphedema at baseline. Neurology: Non focal Skin: no rash  Pertinent Labs/Radiology:    Latest Ref Rng & Units 04/11/2022   10:03 AM 04/10/2022   11:30 PM 06/13/2020    3:35 PM  CBC  WBC 4.0 - 10.5 K/uL  24.7  14.4  5.5   Hemoglobin 12.0 - 15.0 g/dL 13.0  14.4  13.8   Hematocrit 36.0 - 46.0 % 40.8  44.7  42.3   Platelets 150 - 400 K/uL 235  275  238.0     Lab Results  Component Value Date   NA 134 (L) 04/11/2022   K 4.4 04/11/2022   CL 97 (L) 04/11/2022   CO2 25 04/11/2022      Assessment/Plan: Sepsis due to complicated UTI in the setting of right UPJ stone with obstruction/hydronephrosis Sepsis physiology better Continue empiric IV antibiotics Blood cultures never sent-will order today Follow urine cultures Follow clinical course  Right UPJ stone with hydronephrosis S/p cystoscopy with stent placement Urology following  Acute on chronic hypoxic respiratory failure HFpEF exac Likely due to HFpEF exacerbation/postop status Briefly required BiPAP while in the PACU-currently on 5 L (usual home regimen of 2.5-3 L) Check CXR-attempt diuresis with IV Lasix  HTN BP soft Hold all antihypertensives-allow room for diuresis/Lasix  Chronic hypoxic/hypercarbic respiratory failure OSA/OHS On 2.5-3 L of oxygen during the daytime, and trilogy ventilator nightly BIPAP qhs here while hospitalized  History of asthma Continue bronchodilators  Hypothyroidism Continue levothyroxine  Morbid obesity Lymphedema Supportive care Diuretics as able  Morbid Obesity: Estimated body mass index is 53.21 kg/m as calculated from the following:   Height as of this encounter: '5\' 4"'$  (1.626 m).   Weight as of this encounter: 140.6 kg.   Code status:   Code Status: Full Code   DVT Prophylaxis: enoxaparin (LOVENOX) injection 40 mg Start: 04/11/22 1000   Family Communication:  None at bedside   Disposition Plan: Status is: Inpatient Remains  inpatient appropriate because: Severity of illness   Planned Discharge Destination:Home health  Total additional time spent 45 min    Diet: Diet Order             Diet Heart Room service appropriate? Yes; Fluid consistency: Thin; Fluid  restriction: 1500 mL Fluid  Diet effective now                     Antimicrobial agents: Anti-infectives (From admission, onward)    Start     Dose/Rate Route Frequency Ordered Stop   04/11/22 1000  cefTRIAXone (ROCEPHIN) 2 g in sodium chloride 0.9 % 100 mL IVPB        2 g 200 mL/hr over 30 Minutes Intravenous Every 24 hours 04/11/22 0455     04/11/22 0300  cefTRIAXone (ROCEPHIN) 1 g in sodium chloride 0.9 % 100 mL IVPB        1 g 200 mL/hr over 30 Minutes Intravenous  Once 04/11/22 0249 04/11/22 0433        MEDICATIONS: Scheduled Meds:  albuterol  2.5 mg Inhalation Q4H   albuterol       enoxaparin (LOVENOX) injection  40 mg Subcutaneous Daily   fentaNYL       ferrous sulfate  325 mg Oral Q breakfast   furosemide       levothyroxine  200 mcg Oral Q0600   montelukast  10 mg Oral Daily   senna-docusate  1 tablet Oral QHS   Continuous Infusions:  cefTRIAXone (ROCEPHIN)  IV     PRN Meds:.acetaminophen, albuterol, fentaNYL, furosemide, HYDROmorphone (DILAUDID) injection, oxyCODONE, polyethylene glycol   I have personally reviewed following labs and imaging studies  LABORATORY DATA: CBC: Recent Labs  Lab 04/10/22 2330 04/11/22 1003  WBC 14.4* 24.7*  NEUTROABS 13.6*  --   HGB 14.4 13.0  HCT 44.7 40.8  MCV 95.1 95.1  PLT 275 856    Basic Metabolic Panel: Recent Labs  Lab 04/10/22 2330 04/11/22 1003  NA 131* 134*  K 4.0 4.4  CL 95* 97*  CO2 23 25  GLUCOSE 116* 155*  BUN 16 14  CREATININE 0.94 0.99  CALCIUM 9.3 8.8*  MG  --  1.8  PHOS  --  4.3    GFR: Estimated Creatinine Clearance: 77.6 mL/min (by C-G formula based on SCr of 0.99 mg/dL).  Liver Function Tests: Recent Labs  Lab 04/10/22 2330  AST 49*  ALT 31  ALKPHOS 102  BILITOT 1.3*  PROT 7.6  ALBUMIN 4.2   No results for input(s): "LIPASE", "AMYLASE" in the last 168 hours. No results for input(s): "AMMONIA" in the last 168 hours.  Coagulation Profile: No results for input(s):  "INR", "PROTIME" in the last 168 hours.  Cardiac Enzymes: No results for input(s): "CKTOTAL", "CKMB", "CKMBINDEX", "TROPONINI" in the last 168 hours.  BNP (last 3 results) No results for input(s): "PROBNP" in the last 8760 hours.  Lipid Profile: No results for input(s): "CHOL", "HDL", "LDLCALC", "TRIG", "CHOLHDL", "LDLDIRECT" in the last 72 hours.  Thyroid Function Tests: No results for input(s): "TSH", "T4TOTAL", "FREET4", "T3FREE", "THYROIDAB" in the last 72 hours.  Anemia Panel: No results for input(s): "VITAMINB12", "FOLATE", "FERRITIN", "TIBC", "IRON", "RETICCTPCT" in the last 72 hours.  Urine analysis:    Component Value Date/Time   COLORURINE YELLOW 04/10/2022 2300   APPEARANCEUR HAZY (A) 04/10/2022 2300   LABSPEC 1.016 04/10/2022 2300   PHURINE 5.0 04/10/2022 2300   GLUCOSEU NEGATIVE 04/10/2022 2300   GLUCOSEU NEGATIVE 10/28/2012 1357  HGBUR SMALL (A) 04/10/2022 2300   BILIRUBINUR NEGATIVE 04/10/2022 2300   BILIRUBINUR negative 07/20/2014 1355   KETONESUR NEGATIVE 04/10/2022 2300   PROTEINUR 30 (A) 04/10/2022 2300   UROBILINOGEN 0.2 07/20/2014 1355   UROBILINOGEN 0.2 10/28/2012 1357   NITRITE POSITIVE (A) 04/10/2022 2300   LEUKOCYTESUR TRACE (A) 04/10/2022 2300    Sepsis Labs: Lactic Acid, Venous    Component Value Date/Time   LATICACIDVEN 1.3 04/21/2020 1204    MICROBIOLOGY: No results found for this or any previous visit (from the past 240 hour(s)).  RADIOLOGY STUDIES/RESULTS: DG C-Arm 1-60 Min  Result Date: 04/11/2022 CLINICAL DATA:  Fluoro guidance provided EXAM: DG C-ARM 1-60 MIN FINDINGS: Dose: 107.3 mGy Fluoro time 100s IMPRESSION: C-arm fluoro guidance provided. Electronically Signed   By: Sammie Bench M.D.   On: 04/11/2022 08:47   CT Renal Stone Study  Result Date: 04/11/2022 CLINICAL DATA:  Severe right lower back pain for 3 days. Painful urination with dark urine and increased frequency. EXAM: CT ABDOMEN AND PELVIS WITHOUT CONTRAST  TECHNIQUE: Multidetector CT imaging of the abdomen and pelvis was performed following the standard protocol without IV contrast. RADIATION DOSE REDUCTION: This exam was performed according to the departmental dose-optimization program which includes automated exposure control, adjustment of the mA and/or kV according to patient size and/or use of iterative reconstruction technique. COMPARISON:  01/02/2021 FINDINGS: Lower chest: No acute abnormality. Hepatobiliary: No focal liver abnormality is seen. Status post cholecystectomy. No biliary dilatation. Pancreas: Unremarkable. No pancreatic ductal dilatation or surrounding inflammatory changes. Spleen: Normal in size without focal abnormality. Adrenals/Urinary Tract: Normal adrenal glands bilateral cortical renal scarring greater on the right parenchymal calcifications versus nonobstructing calyceal stones in the upper pole of the right kidney. 1.0 cm stone in the right ureteropelvic junction with upstream moderate hydroureteronephrosis nephrosis nephrosis. Asymmetric stranding about the right kidney. The left kidney contains no urinary calculi. No left hydronephrosis. Unremarkable bladder. Stomach/Bowel: Colonic diverticulosis without diverticulitis. Normal caliber large and small bowel. Postoperative change of sleeve gastrectomy. Vascular/Lymphatic: Aortic atherosclerosis. No enlarged abdominal or pelvic lymph nodes. Reproductive: Uterus and bilateral adnexa are unremarkable. Other: No free intraperitoneal fluid or air Musculoskeletal: Nonspecific edema within the anterior abdominal wall pannus with skin thickening. Thoracolumbar spondylosis. IMPRESSION: Obstructing 1.0 cm stone in the right ureteropelvic junction with moderate right hydronephrosis. Nonspecific edema with the in the anterior abdominal wall pannus with skin thickening. Electronically Signed   By: Placido Sou M.D.   On: 04/11/2022 04:00   DG Chest 2 View  Result Date: 04/10/2022 CLINICAL DATA:   Shortness of breath. EXAM: CHEST - 2 VIEW COMPARISON:  June 13, 2020 FINDINGS: There is stable mild to moderate severity enlargement of the cardiac silhouette. Prominent pericardial fat is seen on the right. Mildly increased lung markings are seen with mild areas of atelectasis and/or infiltrate noted within the bilateral lung bases. There is no evidence of a pleural effusion or pneumothorax. Radiopaque surgical clips are seen within the right upper quadrant. Multilevel degenerative changes seen throughout the thoracic spine. IMPRESSION: Mild bibasilar atelectasis and/or infiltrate. Electronically Signed   By: Virgina Norfolk M.D.   On: 04/10/2022 23:50     LOS: 0 days   Oren Binet, MD  [facility]    To contact the attending provider between 7A-7P or the covering provider during after hours 7P-7A, please log into the web site [email or web address] and access using universal [place] password for that web site. If you do not have the password, please call the  hospital operator.  04/11/2022, 11:42 AM

## 2022-04-11 NOTE — Anesthesia Preprocedure Evaluation (Addendum)
Anesthesia Evaluation  Patient identified by MRN, date of birth, ID band Patient awake    Reviewed: Allergy & Precautions, NPO status , Patient's Chart, lab work & pertinent test results  History of Anesthesia Complications (+) PONV and history of anesthetic complications  Airway Mallampati: I  TM Distance: >3 FB Neck ROM: Full    Dental  (+) Dental Advisory Given, Caps   Pulmonary asthma , sleep apnea, Continuous Positive Airway Pressure Ventilation and Oxygen sleep apnea , COPD,  COPD inhaler and oxygen dependent, former smoker   breath sounds clear to auscultation       Cardiovascular hypertension, Pt. on medications (-) angina  Rhythm:Regular Rate:Normal  '22 ECHO: EF 60-65%, normal LVF, normal RVF, no significant valvular abnormalities   Neuro/Psych DDD    GI/Hepatic Neg liver ROS,GERD  Controlled,,  Endo/Other  diabetes (diet controlled)Hypothyroidism  Morbid obesityBMI 53  Renal/GU Renal InsufficiencyRenal diseasestones     Musculoskeletal  (+) Arthritis ,  Fibromyalgia -  Abdominal   Peds  Hematology   Anesthesia Other Findings   Reproductive/Obstetrics                             Anesthesia Physical Anesthesia Plan  ASA: 4  Anesthesia Plan: General   Post-op Pain Management: Minimal or no pain anticipated   Induction: Intravenous and Rapid sequence  PONV Risk Score and Plan: 4 or greater and Ondansetron, Dexamethasone and Treatment may vary due to age or medical condition  Airway Management Planned: Oral ETT  Additional Equipment: None  Intra-op Plan:   Post-operative Plan: Extubation in OR  Informed Consent: I have reviewed the patients History and Physical, chart, labs and discussed the procedure including the risks, benefits and alternatives for the proposed anesthesia with the patient or authorized representative who has indicated his/her understanding and  acceptance.     Dental advisory given  Plan Discussed with: CRNA and Surgeon  Anesthesia Plan Comments:         Anesthesia Quick Evaluation

## 2022-04-11 NOTE — Anesthesia Postprocedure Evaluation (Signed)
Anesthesia Post Note  Patient: Tricia Perry  Procedure(s) Performed: CYSTOSCOPY WITH RETROGRADE PYELOGRAM/URETERAL STENT PLACEMENT (Right)     Patient location during evaluation: PACU Anesthesia Type: General Level of consciousness: awake and alert, patient cooperative and oriented Pain management: pain level controlled Vital Signs Assessment: post-procedure vital signs reviewed and stable Respiratory status: spontaneous breathing, nonlabored ventilation, respiratory function stable and patient connected to nasal cannula oxygen (intermittent CPAP required) Cardiovascular status: blood pressure returned to baseline and stable Postop Assessment: no apparent nausea or vomiting and adequate PO intake Anesthetic complications: no   No notable events documented.  Last Vitals:  Vitals:   04/11/22 0802 04/11/22 0803  BP:    Pulse: (!) 105 (!) 107  Resp: (!) 26 (!) 23  Temp:    SpO2: 95% 96%    Last Pain:  Vitals:   04/11/22 0700  TempSrc:   PainSc: 0-No pain                 Makinzy Cleere,E. Halley Kincer

## 2022-04-11 NOTE — Op Note (Signed)
Preoperative diagnosis:  1.  Right UPJ stone with obstruction and urosepsis  Postoperative diagnosis: 1.  Same  Procedure(s): 1.  Cystoscopy, right retrograde pyelogram with intraoperative interpretation, insertion right JJ stent  Surgeon: Dr. Harold Barban  Anesthesia: General  Complications: None  EBL: Minimal  Specimens: None  Disposition of specimens: Not applicable  Intraoperative findings: 1 cm UPJ stone which appeared to manipulate back into the renal pelvis, 6 French by 26 cm Percuflex plus soft contour stent placed without tether.  Cloudy urine noted through and around the stent after placement.  Indication: 67 year old white female with history of stones.  Presents with couple day history of worsening pain which got worse with fever and nausea vomiting earlier today.  CT scan of the emergency room showed a 1 cm UPJ stone with obstruction.  Presents this time and get cystoscopy insertion right JJ stent  Description of procedure:  After obtaining for consent for the patient she was taken the major cystoscopy suite placed under general anesthesia.  Placed in dorsolithotomy position genitalia were prepped draped in usual sterile fashion.  Positioning somewhat difficult due to her morbid obesity.  Proper pause and timeout was performed for site of procedure.  76 French cystoscope was advanced to the bladder without difficulty.  Bladder had hyperemia noted which was likely related to cystitis.  Right ureteral orifice was identified.  It was cannulated with a 5 French open tip catheter and retrograde pyelogram showed good filling of the ureter up to the UPJ.  I advanced the operative catheter up to the UPJ area and appeared to manipulate stent back into the renal pelvis.  At this point large amount of cloudy urine noted around the open tip catheter.  Sensor wire was passed up to the renal pelvis where it was coiled open tip catheter removed.  6 [place] by 26 cm Percuflex plus soft  contour stent was placed leaving a proximal coil in the renal pelvis and distal coil in the bladder.  There was again brisk flow of urine which was somewhat cloudy through and around the stent noted.  34 French Foley was placed to drain the bladder at the end of the case.  She was awakened from anesthesia and taken back to recovery room in stable condition there is no immediate complication from the procedure.  Patient will likely need retrograde ureteroscopy and laser lithotripsy for definitive management of her stone.

## 2022-04-11 NOTE — Progress Notes (Signed)
Updated patient's spouse Semya Klinke via phone.  All questions answered to the best of my ability.

## 2022-04-11 NOTE — ED Notes (Signed)
Patient transported to CT 

## 2022-04-11 NOTE — ED Provider Notes (Signed)
[facility] EMERGENCY DEPARTMENT Provider Note  CSN: [id number] Arrival date & time: 04/10/22 2248  Chief Complaint(s) Back Pain and Shortness of Breath  HPI Tricia Perry is a 67 y.o. female with a past medical history listed below including hypertension, diabetes, chronic diastolic heart failure who presents to the emergency department for several days of gradually worsening lower abdominal and right flank pain with frequency and dysuria.  Patient believes she might have a kidney stone or urinary tract infection.  She endorses nausea without emesis.  No known fevers.    Additionally she is reporting shortness of breath which is chronic and stable.  Patient reports that at baseline she has dyspnea on exertion making it difficult for her to get around the house.  She reports that she requires the use of the bedside commode and at times is not able to make it to the commode and voids on her self.  She believes this is the reason for recurrent UTIs.  She denies any coughing or congestion.  The history is provided by the patient.    Past Medical History Past Medical History:  Diagnosis Date   Arthritis    Knees; right-TKR, left-bone on bone/end stage   Asthma    Carpal tunnel syndrome on both sides 06/21/2010   Carpal tunnel syndrome, bilateral    Chronic nonallergic rhinitis    allergy eval NEG 10/2014: azelastine and flonase spray rx'd   Chronic venous insufficiency    with edema  and extensive varicose dz: Dr. Linus Mako is managing this, planning laser procedures.; A LOT OF LEG CRAMPS AND LEG STIFFNESS   Complication of anesthesia    STATES SHE WAS TOLD SHE WOKE UP DURING HER KNEE REPLACEMENT SURGERY AND HAD TO BE GIVEN MORE MEDICINE - NOT SURE IF SPINAL OR GENERAL ANESTHESIA   DDD (degenerative disc disease)    Dr. [name] evaluated her and recommended pain mgmt MD.  She saw Dr. Selinda Orion for pain mgmt at one time.   Diabetes mellitus without complication ([place])     Type II   Disequilibrium syndrome    MRI brain normal 2012   DIZZINESS 04/12/2010   Qualifier: Diagnosis of  By: Anitra Lauth M.D., Brien Few    Fibromyalgia    questionable   GERD (gastroesophageal reflux disease)    Heart murmur    functional, w/u done; PALPITATIONS IN THE PAST   History of bronchitis    History of palpitations    History of pulmonary embolism 05/2013   Right sided.  Setting: post-op percutaneous nephrolithotomy--xarelto x 6 mo   Hypertension    Hypothyroidism    Insomnia    Insulin resistance    Kidney stone on right side 12/2014   Dr. Jeffie Pollock considering PCNL, ESWL, and ureteroscopy as of 01/25/15    Microscopic hematuria 2014   CT abd pelv showed 1.8 cm right renal pelvis stone.  Also had UA c/w UTI so abx given.    Mixed incontinence urge and stress    and nocturia x 5-6 per night   Morbid obesity (HCC)    BMI 49.  Gastric stapling surgery in distant past.   Nephrolithiasis    Lithotripsy 2002.  Right percut nephrolith 05/2013.   Nocturia    Numbness and tingling    bilateral hands and feet   OAB (overactive bladder)    Per Dr. Jeffie Pollock: pt declined pelvic floor PT.  Vesicare trial started 01/25/15.   Obesity hypoventilation syndrome (HCC)    Peripheral neuropathy  No old records to confirm pt's report.  Pt refuses to have more NCS b/c test is painful.   PONV (postoperative nausea and vomiting)    Shortness of breath    CHRONIC; 3 mo trial off of ACE-I made no difference.  Spirometry x 2 has shown no obstruction.  Allergy w/u NEG.   Shoulder pain    HX OF BILATERAL SHOULDER SURGERIES - PT HAS VERY LIMITED ROM - ESPECIALLY RAISING HER ARMS   Sleep apnea    PT DOES NOT HAVE MASK OR TUBING - JUST SLEEPS WITH HEAD ELEVATED ON 2 PILLOWS    Spinal stenosis    with L5 radiculopathy, also pseudospondylolisthesis per Dr. [name]   Superficial thrombophlebitis of left leg 05/2013   with cellulitis--resolved approp with abx and ice   Urinary incontinence    Urinary  urgency    Vertigo    Patient Active Problem List   Diagnosis Date Noted   Chronic respiratory failure with hypercapnia ([place]) 09/24/2021   History of total knee arthroplasty, right 10/24/2020   Chronic diastolic heart failure ([place]) 97/05/6376   Diastolic dysfunction 58/85/0277   Acute on chronic respiratory failure ([place]) 04/30/2020   Morbid obesity ([place]) 04/30/2020   Acute on chronic respiratory failure with hypoxia and hypercapnia ([place]) 04/30/2020   Acute hypoxemic respiratory failure ([place]) 04/24/2020   Hypoxia 04/21/2020   Maxillary sinusitis 02/19/2015   OSA (obstructive sleep apnea) 12/13/2013   Hypothyroidism 12/04/2013   Varicose veins of lower extremities with other complications 41/28/7867   Insomnia 06/13/2013   Pulmonary embolism ([place]) 05/29/2013   Shortness of breath 04/20/2013   Insulin resistance 11/29/2012   HTN (hypertension) 11/29/2012   Vitamin B12 deficiency 11/29/2012   Chronic pain 07/11/2010   Chronic venous insufficiency 06/21/2010   B12 DEFICIENCY 05/10/2010   IRRITABLE BOWEL SYNDROME 05/10/2010   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 Authorizing Provider  acetaminophen (TYLENOL) 325 MG tablet Take 650 mg by mouth every 6 (six) hours as needed for mild pain.    [provider]  albuterol (PROVENTIL) (2.5 MG/3ML) 0.083% nebulizer solution Take 3 mLs (2.5 mg total) by nebulization every 6 (six) hours as needed for wheezing or shortness of breath. 06/13/20   Martyn Ehrich, NP  albuterol (VENTOLIN HFA) 108 (90 Base) MCG/ACT inhaler albuterol sulfate HFA 90 mcg/actuation aerosol inhaler  INL 2 PFS PO Q 4 TO 6 H PRF WHZ OR SOB    [provider]  amLODipine (NORVASC) 5 MG tablet Take 5 mg by mouth daily. 02/14/20   [provider]  aspirin EC 81 MG tablet Take 81 mg by mouth daily. Swallow whole.     [provider]  cholecalciferol (VITAMIN D3) 25 MCG (1000 UNIT) tablet Take 1,000 Units by mouth daily.    [provider]  Ferrous Sulfate (IRON PO) Take 1 tablet by mouth daily.    [provider]  furosemide (LASIX) 20 MG tablet TAKE 1 TABLET(20 MG) BY MOUTH DAILY 09/11/20   Margaretha Seeds, MD  levothyroxine (SYNTHROID, LEVOTHROID) 200 MCG tablet TAKE ONE TABLET BY MOUTH ONE TIME DAILY before breakfast 11/07/14   McGowen, Adrian Blackwater, MD  losartan (COZAAR) 100 MG tablet Take 100 mg by mouth daily. 02/14/20   [provider]  montelukast (SINGULAIR) 10 MG tablet Take 10 mg by mouth in the morning.    [provider]  OXYGEN  Inhale 2.5 L into the lungs continuous.    [provider]  VITAMIN A PO Take 2 capsules by mouth daily.    [provider]  vitamin C (ASCORBIC ACID) 500 MG tablet Take 500 mg by mouth daily.    [provider]  Vitamin D, Ergocalciferol, (DRISDOL) 1.25 MG (50000 UNIT) CAPS capsule Take 50,000 Units by mouth once a week. Take on Thursdays 04/12/20   [provider]                                                                                                                                    Allergies Gabapentin, Mold extract [trichophyton], Morphine, and Codeine  Review of Systems Review of Systems As noted in HPI  Physical Exam Vital Signs  I have reviewed the triage vital signs BP 110/64   Pulse (!) 109   Temp 98.8 F (37.1 C) (Oral)   Resp (!) 25   Ht '5\' 4"'$  (1.626 m)   Wt (!) 140.6 kg   SpO2 95%   BMI 53.21 kg/m   Physical Exam Vitals reviewed.  Constitutional:      General: She is not in acute distress.    Appearance: She is well-developed. She is morbidly obese. She is not diaphoretic.  HENT:     Head: Normocephalic and atraumatic.     Nose: Nose normal.  Eyes:     General: No scleral icterus.       Right eye: No discharge.        Left eye: No discharge.      Conjunctiva/sclera: Conjunctivae normal.     Pupils: Pupils are equal, round, and reactive to light.  Cardiovascular:     Rate and Rhythm: Normal rate and regular rhythm.     Heart sounds: No murmur heard.    No friction rub. No gallop.  Pulmonary:     Effort: Pulmonary effort is normal. No respiratory distress.     Breath sounds: Normal breath sounds. No stridor. No rales.  Abdominal:     General: There is no distension.     Palpations: Abdomen is soft.     Tenderness: There is abdominal tenderness in the suprapubic area. There is right CVA tenderness.  Musculoskeletal:        General: No tenderness.     Cervical back: Normal range of motion and neck supple.  Skin:    General: Skin is warm and dry.     Findings: No erythema or rash.  Neurological:     Mental Status: She is alert and oriented to person, place, and time.     ED Results and Treatments Labs (all labs ordered are listed, but only abnormal results are displayed) Labs Reviewed  COMPREHENSIVE METABOLIC PANEL - Abnormal; Notable for the following components:      Result Value   Sodium 131 (*)    Chloride 95 (*)    Glucose, Bld  116 (*)    AST 49 (*)    Total Bilirubin 1.3 (*)    All other components within normal limits  CBC WITH DIFFERENTIAL/PLATELET - Abnormal; Notable for the following components:   WBC 14.4 (*)    Neutro Abs 13.6 (*)    Lymphs Abs 0.5 (*)    All other components within normal limits  URINALYSIS, ROUTINE W REFLEX MICROSCOPIC - Abnormal; Notable for the following components:   APPearance HAZY (*)    Hgb urine dipstick SMALL (*)    Protein, ur 30 (*)    Nitrite POSITIVE (*)    Leukocytes,Ua TRACE (*)    Bacteria, UA MANY (*)    All other components within normal limits  URINE CULTURE                                                                                                                         EKG  EKG Interpretation  Date/Time:    Ventricular Rate:    PR Interval:    QRS  Duration:   QT Interval:    QTC Calculation:   R Axis:     Text Interpretation:         Radiology CT Renal Stone Study  Result Date: 04/11/2022 CLINICAL DATA:  Severe right lower back pain for 3 days. Painful urination with dark urine and increased frequency. EXAM: CT ABDOMEN AND PELVIS WITHOUT CONTRAST TECHNIQUE: Multidetector CT imaging of the abdomen and pelvis was performed following the standard protocol without IV contrast. RADIATION DOSE REDUCTION: This exam was performed according to the departmental dose-optimization program which includes automated exposure control, adjustment of the mA and/or kV according to patient size and/or use of iterative reconstruction technique. COMPARISON:  01/02/2021 FINDINGS: Lower chest: No acute abnormality. Hepatobiliary: No focal liver abnormality is seen. Status post cholecystectomy. No biliary dilatation. Pancreas: Unremarkable. No pancreatic ductal dilatation or surrounding inflammatory changes. Spleen: Normal in size without focal abnormality. Adrenals/Urinary Tract: Normal adrenal glands bilateral cortical renal scarring greater on the right parenchymal calcifications versus nonobstructing calyceal stones in the upper pole of the right kidney. 1.0 cm stone in the right ureteropelvic junction with upstream moderate hydroureteronephrosis nephrosis nephrosis. Asymmetric stranding about the right kidney. The left kidney contains no urinary calculi. No left hydronephrosis. Unremarkable bladder. Stomach/Bowel: Colonic diverticulosis without diverticulitis. Normal caliber large and small bowel. Postoperative change of sleeve gastrectomy. Vascular/Lymphatic: Aortic atherosclerosis. No enlarged abdominal or pelvic lymph nodes. Reproductive: Uterus and bilateral adnexa are unremarkable. Other: No free intraperitoneal fluid or air Musculoskeletal: Nonspecific edema within the anterior abdominal wall pannus with skin thickening. Thoracolumbar spondylosis. IMPRESSION:  Obstructing 1.0 cm stone in the right ureteropelvic junction with moderate right hydronephrosis. Nonspecific edema with the in the anterior abdominal wall pannus with skin thickening. Electronically Signed   By: Placido Sou M.D.   On: 04/11/2022 04:00   DG Chest 2 View  Result Date: 04/10/2022 CLINICAL DATA:  Shortness of breath. EXAM: CHEST - 2 VIEW COMPARISON:  June 13, 2020 FINDINGS: There is stable mild to moderate severity enlargement of the cardiac silhouette. Prominent pericardial fat is seen on the right. Mildly increased lung markings are seen with mild areas of atelectasis and/or infiltrate noted within the bilateral lung bases. There is no evidence of a pleural effusion or pneumothorax. Radiopaque surgical clips are seen within the right upper quadrant. Multilevel degenerative changes seen throughout the thoracic spine. IMPRESSION: Mild bibasilar atelectasis and/or infiltrate. Electronically Signed   By: Virgina Norfolk M.D.   On: 04/10/2022 23:50    Medications Ordered in ED Medications  acetaminophen (TYLENOL) tablet 1,000 mg (1,000 mg Oral Given 04/11/22 0336)  cefTRIAXone (ROCEPHIN) 1 g in sodium chloride 0.9 % 100 mL IVPB (0 g Intravenous Stopped 04/11/22 0433)  ondansetron (ZOFRAN) injection 4 mg (4 mg Intravenous Given 04/11/22 0331)  sodium chloride 0.9 % bolus 500 mL (0 mLs Intravenous Stopped 04/11/22 0432)  ketorolac (TORADOL) 15 MG/ML injection 15 mg (15 mg Intravenous Given 04/11/22 0330)                                                                                                                                     Procedures Procedures  (including critical care time)  Medical Decision Making / ED Course   Medical Decision Making Amount and/or Complexity of Data Reviewed Labs: ordered. Decision-making details documented in ED Course. Radiology: ordered and independent interpretation performed. Decision-making details documented in ED Course.  Risk OTC  drugs. Prescription drug management. Decision regarding hospitalization.    Urinary symptoms with suprapubic and right flank pain.  Differential includes but not limited to UTI/pyelonephritis, or less likely appendicitis/other intra-abdominal inflammatory/infectious processes.   On record review of patient's previous CT scan from October 2022 radiologist read a left renal stone.  On my evaluation of it, the stone appears to be on the right side. Will rule out obstructing renal stone.  CBC with leukocytosis.  No anemia. Metabolic panel with mild hyponatremia.  Mild hyperglycemia without DKA.  No renal insufficiency.  No evidence of bili obstruction. UA consistent with a urinary tract infection  Chest x-ray obtained to assess shortness of breath or evidence of pulmonary edema. This revealed bibasilar atelectasis.  No evidence of pulmonary edema.  Patient is stable on her 2 L nasal cannula.  CT stone study confirmed 1 cm proximal UPJ obstructing stone.  Treated with IV Rocephin, antiemetic, and Toradol.  Consulted urology and spoke with Dr. Milford Cage who will arrange operative management.  Will admit patient to medicine.       Final Clinical Impression(s) / ED Diagnoses Final diagnoses:  Obstruction of left ureteropelvic junction (UPJ) due to stone  Acute UTI           This chart was dictated using voice recognition software.  Despite best efforts to proofread,  errors can occur which can change the documentation meaning.    Fatima Blank, MD 04/11/22 [phone]

## 2022-04-11 NOTE — Consult Note (Addendum)
Urology Consult   Physician requesting consult: Cardama  Reason for consult: Right ureteropelvic junction stone with obstruction and possible urosepsis  History of Present Illness: Tricia Perry is a 67 y.o. white female with history of stones followed by Dr. Jeffie Pollock previously at [facility] urology and is undergone right-sided PCNL in the past.  She presents with 2 to 3-day history of worsening right-sided flank pain which progressed during the day today with nausea and vomiting.  She has a CT scan showing a 1 cm stone at the right UPJ causing obstruction and hydronephrosis.  Patient has numerous medical problems including hypertension, diabetes, chronic diastolic heart failure.  Noted to be tachycardic in the emergency room.  White blood cell count elevated although she is afebrile.  She is now scheduled for cystoscopy and placement of urgent right-sided stent for impending urosepsis.  She denies a history of voiding or storage urinary symptoms, hematuria, UTIs, STDs, urolithiasis, GU malignancy/trauma/surgery.  Past Medical History:  Diagnosis Date   Arthritis    Knees; right-TKR, left-bone on bone/end stage   Asthma    Carpal tunnel syndrome on both sides 06/21/2010   Carpal tunnel syndrome, bilateral    Chronic nonallergic rhinitis    allergy eval NEG 10/2014: azelastine and flonase spray rx'd   Chronic venous insufficiency    with edema  and extensive varicose dz: Dr. Linus Mako is managing this, planning laser procedures.; A LOT OF LEG CRAMPS AND LEG STIFFNESS   Complication of anesthesia    STATES SHE WAS TOLD SHE WOKE UP DURING HER KNEE REPLACEMENT SURGERY AND HAD TO BE GIVEN MORE MEDICINE - NOT SURE IF SPINAL OR GENERAL ANESTHESIA   DDD (degenerative disc disease)    Dr. [name] evaluated her and recommended pain mgmt MD.  She saw Dr. Selinda Orion for pain mgmt at one time.   Diabetes mellitus without complication ([place])    Type II   Disequilibrium syndrome    MRI brain normal 2012    DIZZINESS 04/12/2010   Qualifier: Diagnosis of  By: Anitra Lauth M.D., Brien Few    Fibromyalgia    questionable   GERD (gastroesophageal reflux disease)    Heart murmur    functional, w/u done; PALPITATIONS IN THE PAST   History of bronchitis    History of palpitations    History of pulmonary embolism 05/2013   Right sided.  Setting: post-op percutaneous nephrolithotomy--xarelto x 6 mo   Hypertension    Hypothyroidism    Insomnia    Insulin resistance    Kidney stone on right side 12/2014   Dr. Jeffie Pollock considering PCNL, ESWL, and ureteroscopy as of 01/25/15    Microscopic hematuria 2014   CT abd pelv showed 1.8 cm right renal pelvis stone.  Also had UA c/w UTI so abx given.    Mixed incontinence urge and stress    and nocturia x 5-6 per night   Morbid obesity (HCC)    BMI 49.  Gastric stapling surgery in distant past.   Nephrolithiasis    Lithotripsy 2002.  Right percut nephrolith 05/2013.   Nocturia    Numbness and tingling    bilateral hands and feet   OAB (overactive bladder)    Per Dr. Jeffie Pollock: pt declined pelvic floor PT.  Vesicare trial started 01/25/15.   Obesity hypoventilation syndrome (HCC)    Peripheral neuropathy    No old records to confirm pt's report.  Pt refuses to have more NCS b/c test is painful.   PONV (postoperative nausea and vomiting)  Shortness of breath    CHRONIC; 3 mo trial off of ACE-I made no difference.  Spirometry x 2 has shown no obstruction.  Allergy w/u NEG.   Shoulder pain    HX OF BILATERAL SHOULDER SURGERIES - PT HAS VERY LIMITED ROM - ESPECIALLY RAISING HER ARMS   Sleep apnea    PT DOES NOT HAVE MASK OR TUBING - JUST SLEEPS WITH HEAD ELEVATED ON 2 PILLOWS    Spinal stenosis    with L5 radiculopathy, also pseudospondylolisthesis per Dr. [name]   Superficial thrombophlebitis of left leg 05/2013   with cellulitis--resolved approp with abx and ice   Urinary incontinence    Urinary urgency    Vertigo     Past Surgical History:  Procedure  Laterality Date   BILATERAL SHOULDER SURGERY     BREAST EXCISIONAL BIOPSY Left 11/12/2015   BREAST LUMPECTOMY WITH RADIOACTIVE SEED LOCALIZATION Left 11/12/2015   Procedure: LEFT BREAST LUMPECTOMY WITH RADIOACTIVE SEED LOCALIZATION;  Surgeon: Autumn Messing III, MD;  Location: [place];  Service: General;  Laterality: Left;   CHOLECYSTECTOMY  1987   ENDOMETRIAL ABLATION     menorrhagia   EXTRACORPOREAL SHOCK WAVE LITHOTRIPSY     FOOT SURGERY  2000   Right tarsel tunnel release   GASTRIC RESTRICTION SURGERY  remote past   HERNIA REPAIR   1980's   Diaphragmatic + gastric stapling   HERNIA REPAIR     Inguinal   JOINT REPLACEMENT  2009   Right Knee, [place]   NEPHROLITHOTOMY Right 05/31/2013   Procedure: NEPHROLITHOTOMY PERCUTANEOUS;  Surgeon: Irine Seal, MD;  Location: [facility];  Service: Urology;  Laterality: Right;   PFT's  09/2013; 12/2013   09/2013 mild restriction.  12/2013 Low vital capacity, possibly due to restriction from pt's body habitus.   TRANSTHORACIC ECHOCARDIOGRAM  12/16/13   EF 93-79%, grade 2 diastolic dysfxn, PA pressure 37 (mildly high)     Current Hospital Medications:  Home meds:  No current facility-administered medications on file prior to encounter.   Current Outpatient Medications on File Prior to Encounter  Medication Sig Dispense Refill   acetaminophen (TYLENOL) 325 MG tablet Take 650 mg by mouth every 6 (six) hours as needed for mild pain.     albuterol (PROVENTIL) (2.5 MG/3ML) 0.083% nebulizer solution Take 3 mLs (2.5 mg total) by nebulization every 6 (six) hours as needed for wheezing or shortness of breath. 75 mL 12   albuterol (VENTOLIN HFA) 108 (90 Base) MCG/ACT inhaler albuterol sulfate HFA 90 mcg/actuation aerosol inhaler  INL 2 PFS PO Q 4 TO 6 H PRF WHZ OR SOB     amLODipine (NORVASC) 5 MG tablet Take 5 mg by mouth daily.     aspirin EC 81 MG tablet Take 81 mg by mouth daily. Swallow whole.     cholecalciferol (VITAMIN D3) 25 MCG (1000 UNIT) tablet Take  1,000 Units by mouth daily.     Ferrous Sulfate (IRON PO) Take 1 tablet by mouth daily.     furosemide (LASIX) 20 MG tablet TAKE 1 TABLET(20 MG) BY MOUTH DAILY 30 tablet 3   levothyroxine (SYNTHROID, LEVOTHROID) 200 MCG tablet TAKE ONE TABLET BY MOUTH ONE TIME DAILY before breakfast 30 tablet 11   losartan (COZAAR) 100 MG tablet Take 100 mg by mouth daily.     montelukast (SINGULAIR) 10 MG tablet Take 10 mg by mouth in the morning.     OXYGEN Inhale 2.5 L into the lungs continuous.     VITAMIN A  PO Take 2 capsules by mouth daily.     vitamin C (ASCORBIC ACID) 500 MG tablet Take 500 mg by mouth daily.     Vitamin D, Ergocalciferol, (DRISDOL) 1.25 MG (50000 UNIT) CAPS capsule Take 50,000 Units by mouth once a week. Take on Thursdays       Scheduled Meds: Continuous Infusions:  cefTRIAXone (ROCEPHIN)  IV     PRN Meds:.  Allergies:  Allergies  Allergen Reactions   Gabapentin Swelling    Legs    Mold Extract [Trichophyton] Nausea And Vomiting and Other (See Comments)    Sneezing real bad    Morphine Itching and Other (See Comments)    back hurts   Codeine Itching and Other (See Comments)    Back hurts    Family History  Problem Relation Age of Onset   Hypertension Mother    Cancer Mother        Brain/Lung/ smoker   Diabetes Mother    Stroke Father    Hypertension Father    ADD / ADHD Daughter        ADHD   Thyroid disease Brother    Hypertension Brother    Varicose Veins Brother    Peripheral vascular disease Brother    Heart attack Maternal Grandfather    Alcohol abuse Paternal Grandfather    Breast cancer Maternal Grandmother    Breast cancer Paternal Aunt    Breast cancer Paternal Aunt     Social History:  reports that she quit smoking about 24 years ago. Her smoking use included cigarettes. She started smoking about 45 years ago. She has a 3.00 pack-year smoking history. She has never used smokeless tobacco. She reports current alcohol use. She reports that she does  not use drugs.  ROS: A complete review of systems was performed.  All systems are negative except for pertinent findings as noted.  Physical Exam:  Vital signs in last 24 hours: Temp:  [97.8 F (36.6 C)-99 F (37.2 C)] 98.8 F (37.1 C) (01/12 0053) Pulse Rate:  [94-124] 109 (01/12 0415) Resp:  [18-25] 25 (01/12 0415) BP: (99-197)/(56-110) 110/64 (01/12 0415) SpO2:  [92 %-99 %] 95 % (01/12 0415) Weight:  [140.6 kg] 140.6 kg (01/11 2250) Constitutional:  Alert and oriented, No acute distress Cardiovascular: Regular rate and rhythm,  Respiratory: Normal respiratory effort, Lungs clear bilaterally GI: Abdomen is soft, obese no rebound tenderness GU: Right CVA tenderness Lymphatic: No lymphadenopathy Neurologic: Grossly intact, no focal deficits Psychiatric: Normal mood and affect  Laboratory Data:  Recent Labs    04/10/22 2330  WBC 14.4*  HGB 14.4  HCT 44.7  PLT 275    Recent Labs    04/10/22 2330  NA 131*  K 4.0  CL 95*  GLUCOSE 116*  BUN 16  CALCIUM 9.3  CREATININE 0.94     Results for orders placed or performed during the hospital encounter of 04/10/22 (from the past 24 hour(s))  Urinalysis, Routine w reflex microscopic Urine, Clean Catch     Status: Abnormal   Collection Time: 04/10/22 11:00 PM  Result Value Ref Range   Color, Urine YELLOW YELLOW   APPearance HAZY (A) CLEAR   Specific Gravity, Urine 1.016 1.005 - 1.030   pH 5.0 5.0 - 8.0   Glucose, UA NEGATIVE NEGATIVE mg/dL   Hgb urine dipstick SMALL (A) NEGATIVE   Bilirubin Urine NEGATIVE NEGATIVE   Ketones, ur NEGATIVE NEGATIVE mg/dL   Protein, ur 30 (A) NEGATIVE mg/dL   Nitrite POSITIVE (A)  NEGATIVE   Leukocytes,Ua TRACE (A) NEGATIVE   RBC / HPF 0-5 0 - 5 RBC/hpf   WBC, UA 11-20 0 - 5 WBC/hpf   Bacteria, UA MANY (A) NONE SEEN   Squamous Epithelial / HPF 0-5 0 - 5 /HPF   Mucus PRESENT   Comprehensive metabolic panel     Status: Abnormal   Collection Time: 04/10/22 11:30 PM  Result Value Ref  Range   Sodium 131 (L) 135 - 145 mmol/L   Potassium 4.0 3.5 - 5.1 mmol/L   Chloride 95 (L) 98 - 111 mmol/L   CO2 23 22 - 32 mmol/L   Glucose, Bld 116 (H) 70 - 99 mg/dL   BUN 16 8 - 23 mg/dL   Creatinine, Ser 0.94 0.44 - 1.00 mg/dL   Calcium 9.3 8.9 - 10.3 mg/dL   Total Protein 7.6 6.5 - 8.1 g/dL   Albumin 4.2 3.5 - 5.0 g/dL   AST 49 (H) 15 - 41 U/L   ALT 31 0 - 44 U/L   Alkaline Phosphatase 102 38 - 126 U/L   Total Bilirubin 1.3 (H) 0.3 - 1.2 mg/dL   GFR, Estimated >60 >60 mL/min   Anion gap 13 5 - 15  CBC with Differential     Status: Abnormal   Collection Time: 04/10/22 11:30 PM  Result Value Ref Range   WBC 14.4 (H) 4.0 - 10.5 K/uL   RBC 4.70 3.87 - 5.11 MIL/uL   Hemoglobin 14.4 12.0 - 15.0 g/dL   HCT 44.7 36.0 - 46.0 %   MCV 95.1 80.0 - 100.0 fL   MCH 30.6 26.0 - 34.0 pg   MCHC 32.2 30.0 - 36.0 g/dL   RDW 13.9 11.5 - 15.5 %   Platelets 275 150 - 400 K/uL   nRBC 0.0 0.0 - 0.2 %   Neutrophils Relative % 95 %   Neutro Abs 13.6 (H) 1.7 - 7.7 K/uL   Lymphocytes Relative 3 %   Lymphs Abs 0.5 (L) 0.7 - 4.0 K/uL   Monocytes Relative 2 %   Monocytes Absolute 0.2 0.1 - 1.0 K/uL   Eosinophils Relative 0 %   Eosinophils Absolute 0.0 0.0 - 0.5 K/uL   Basophils Relative 0 %   Basophils Absolute 0.0 0.0 - 0.1 K/uL   Immature Granulocytes 0 %   Abs Immature Granulocytes 0.06 0.00 - 0.07 K/uL   No results found for this or any previous visit (from the past 240 hour(s)).  Renal Function: Recent Labs    04/10/22 2330  CREATININE 0.94   Estimated Creatinine Clearance: 81.7 mL/min (by C-G formula based on SCr of 0.94 mg/dL).  Radiologic Imaging: CT Renal Stone Study  Result Date: 04/11/2022 CLINICAL DATA:  Severe right lower back pain for 3 days. Painful urination with dark urine and increased frequency. EXAM: CT ABDOMEN AND PELVIS WITHOUT CONTRAST TECHNIQUE: Multidetector CT imaging of the abdomen and pelvis was performed following the standard protocol without IV contrast.  RADIATION DOSE REDUCTION: This exam was performed according to the departmental dose-optimization program which includes automated exposure control, adjustment of the mA and/or kV according to patient size and/or use of iterative reconstruction technique. COMPARISON:  01/02/2021 FINDINGS: Lower chest: No acute abnormality. Hepatobiliary: No focal liver abnormality is seen. Status post cholecystectomy. No biliary dilatation. Pancreas: Unremarkable. No pancreatic ductal dilatation or surrounding inflammatory changes. Spleen: Normal in size without focal abnormality. Adrenals/Urinary Tract: Normal adrenal glands bilateral cortical renal scarring greater on the right parenchymal calcifications versus nonobstructing calyceal stones  in the upper pole of the right kidney. 1.0 cm stone in the right ureteropelvic junction with upstream moderate hydroureteronephrosis nephrosis nephrosis. Asymmetric stranding about the right kidney. The left kidney contains no urinary calculi. No left hydronephrosis. Unremarkable bladder. Stomach/Bowel: Colonic diverticulosis without diverticulitis. Normal caliber large and small bowel. Postoperative change of sleeve gastrectomy. Vascular/Lymphatic: Aortic atherosclerosis. No enlarged abdominal or pelvic lymph nodes. Reproductive: Uterus and bilateral adnexa are unremarkable. Other: No free intraperitoneal fluid or air Musculoskeletal: Nonspecific edema within the anterior abdominal wall pannus with skin thickening. Thoracolumbar spondylosis. IMPRESSION: Obstructing 1.0 cm stone in the right ureteropelvic junction with moderate right hydronephrosis. Nonspecific edema with the in the anterior abdominal wall pannus with skin thickening. Electronically Signed   By: Placido Sou M.D.   On: 04/11/2022 04:00   DG Chest 2 View  Result Date: 04/10/2022 CLINICAL DATA:  Shortness of breath. EXAM: CHEST - 2 VIEW COMPARISON:  June 13, 2020 FINDINGS: There is stable mild to moderate severity  enlargement of the cardiac silhouette. Prominent pericardial fat is seen on the right. Mildly increased lung markings are seen with mild areas of atelectasis and/or infiltrate noted within the bilateral lung bases. There is no evidence of a pleural effusion or pneumothorax. Radiopaque surgical clips are seen within the right upper quadrant. Multilevel degenerative changes seen throughout the thoracic spine. IMPRESSION: Mild bibasilar atelectasis and/or infiltrate. Electronically Signed   By: Virgina Norfolk M.D.   On: 04/10/2022 23:50    I independently reviewed the above imaging studies.  Impression/Recommendation: 1.  Right ureteropelvic junction stone with obstruction and impending urosepsis Plan/recommendation.  Scheduled for urgent cystoscopy and right JJ stent placement.  Be admitted to hospital service for IV antibiotics.  Will likely need retrograde ureteroscopy and laser lithotripsy for definitive management once medically stable.  Remi Haggard 04/11/2022, 4:57 AM     CC: Dr. Jeffie Pollock

## 2022-04-11 NOTE — Transfer of Care (Signed)
Immediate Anesthesia Transfer of Care Note  Patient: Tricia Perry  Procedure(s) Performed: CYSTOSCOPY WITH RETROGRADE PYELOGRAM/URETERAL STENT PLACEMENT (Right)  Patient Location: PACU  Anesthesia Type:General  Level of Consciousness: awake, alert , and oriented  Airway & Oxygen Therapy: Patient Spontanous Breathing, Patient connected to face mask oxygen, and aerosol face mask  Post-op Assessment: Report given to RN, Post -op Vital signs reviewed and stable, and Patient moving all extremities  Post vital signs: Reviewed and stable  Last Vitals:  Vitals Value Taken Time  BP 114/67 04/11/22 0656  Temp    Pulse 109 04/11/22 0700  Resp 35 04/11/22 0700  SpO2 90 % 04/11/22 0700  Vitals shown include unvalidated device data.  Last Pain:  Vitals:   04/11/22 0409  TempSrc:   PainSc: 7          Complications: No notable events documented.

## 2022-04-11 NOTE — H&P (View-Only) (Signed)
Urology Consult   Physician requesting consult: [place]  Reason for consult: Right ureteropelvic junction stone with obstruction and possible urosepsis  History of Present Illness: Tricia Perry is a 67 y.o. white female with history of stones followed by Dr. Jeffie Pollock previously at [facility] and is undergone right-sided PCNL in the past.  She presents with 2 to 3-day history of worsening right-sided flank pain which progressed during the day today with nausea and vomiting.  She has a CT scan showing a 1 cm stone at the right UPJ causing obstruction and hydronephrosis.  Patient has numerous medical problems including hypertension, diabetes, chronic diastolic heart failure.  Noted to be tachycardic in the emergency room.  White blood cell count elevated although she is afebrile.  She is now scheduled for cystoscopy and placement of urgent right-sided stent for impending urosepsis.  She denies a history of voiding or storage urinary symptoms, hematuria, UTIs, STDs, urolithiasis, GU malignancy/trauma/surgery.  Past Medical History:  Diagnosis Date   Arthritis    Knees; right-TKR, left-bone on bone/end stage   Asthma    Carpal tunnel syndrome on both sides 06/21/2010   Carpal tunnel syndrome, bilateral    Chronic nonallergic rhinitis    allergy eval NEG 10/2014: azelastine and flonase spray rx'd   Chronic venous insufficiency    with edema  and extensive varicose dz: Dr. Linus Mako is managing this, planning laser procedures.; A LOT OF LEG CRAMPS AND LEG STIFFNESS   Complication of anesthesia    STATES SHE WAS TOLD SHE WOKE UP DURING HER KNEE REPLACEMENT SURGERY AND HAD TO BE GIVEN MORE MEDICINE - NOT SURE IF SPINAL OR GENERAL ANESTHESIA   DDD (degenerative disc disease)    Dr. [name] evaluated her and recommended pain mgmt MD.  She saw Dr. Selinda Orion for pain mgmt at one time.   Diabetes mellitus without complication ([place])    Type II   Disequilibrium syndrome    MRI brain normal 2012    DIZZINESS 04/12/2010   Qualifier: Diagnosis of  By: Anitra Lauth M.D., Brien Few    Fibromyalgia    questionable   GERD (gastroesophageal reflux disease)    Heart murmur    functional, w/u done; PALPITATIONS IN THE PAST   History of bronchitis    History of palpitations    History of pulmonary embolism 05/2013   Right sided.  Setting: post-op percutaneous nephrolithotomy--xarelto x 6 mo   Hypertension    Hypothyroidism    Insomnia    Insulin resistance    Kidney stone on right side 12/2014   Dr. Jeffie Pollock considering PCNL, ESWL, and ureteroscopy as of 01/25/15    Microscopic hematuria 2014   CT abd pelv showed 1.8 cm right renal pelvis stone.  Also had UA c/w UTI so abx given.    Mixed incontinence urge and stress    and nocturia x 5-6 per night   Morbid obesity (HCC)    BMI 49.  Gastric stapling surgery in distant past.   Nephrolithiasis    Lithotripsy 2002.  Right percut nephrolith 05/2013.   Nocturia    Numbness and tingling    bilateral hands and feet   OAB (overactive bladder)    Per Dr. Jeffie Pollock: pt declined pelvic floor PT.  Vesicare trial started 01/25/15.   Obesity hypoventilation syndrome (HCC)    Peripheral neuropathy    No old records to confirm pt's report.  Pt refuses to have more NCS b/c test is painful.   PONV (postoperative nausea and vomiting)  Shortness of breath    CHRONIC; 3 mo trial off of ACE-I made no difference.  Spirometry x 2 has shown no obstruction.  Allergy w/u NEG.   Shoulder pain    HX OF BILATERAL SHOULDER SURGERIES - PT HAS VERY LIMITED ROM - ESPECIALLY RAISING HER ARMS   Sleep apnea    PT DOES NOT HAVE MASK OR TUBING - JUST SLEEPS WITH HEAD ELEVATED ON 2 PILLOWS    Spinal stenosis    with L5 radiculopathy, also pseudospondylolisthesis per Dr. [name]   Superficial thrombophlebitis of left leg 05/2013   with cellulitis--resolved approp with abx and ice   Urinary incontinence    Urinary urgency    Vertigo     Past Surgical History:  Procedure  Laterality Date   BILATERAL SHOULDER SURGERY     BREAST EXCISIONAL BIOPSY Left 11/12/2015   BREAST LUMPECTOMY WITH RADIOACTIVE SEED LOCALIZATION Left 11/12/2015   Procedure: LEFT BREAST LUMPECTOMY WITH RADIOACTIVE SEED LOCALIZATION;  Surgeon: Autumn Messing III, MD;  Location: [place];  Service: General;  Laterality: Left;   CHOLECYSTECTOMY  1987   ENDOMETRIAL ABLATION     menorrhagia   EXTRACORPOREAL SHOCK WAVE LITHOTRIPSY     FOOT SURGERY  2000   Right tarsel tunnel release   GASTRIC RESTRICTION SURGERY  remote past   HERNIA REPAIR   1980's   Diaphragmatic + gastric stapling   HERNIA REPAIR     Inguinal   JOINT REPLACEMENT  2009   Right Knee, [place]   NEPHROLITHOTOMY Right 05/31/2013   Procedure: NEPHROLITHOTOMY PERCUTANEOUS;  Surgeon: Irine Seal, MD;  Location: [facility];  Service: Urology;  Laterality: Right;   PFT's  09/2013; 12/2013   09/2013 mild restriction.  12/2013 Low vital capacity, possibly due to restriction from pt's body habitus.   TRANSTHORACIC ECHOCARDIOGRAM  12/16/13   EF 24-23%, grade 2 diastolic dysfxn, PA pressure 37 (mildly high)     Current Hospital Medications:  Home meds:  No current facility-administered medications on file prior to encounter.   Current Outpatient Medications on File Prior to Encounter  Medication Sig Dispense Refill   acetaminophen (TYLENOL) 325 MG tablet Take 650 mg by mouth every 6 (six) hours as needed for mild pain.     albuterol (PROVENTIL) (2.5 MG/3ML) 0.083% nebulizer solution Take 3 mLs (2.5 mg total) by nebulization every 6 (six) hours as needed for wheezing or shortness of breath. 75 mL 12   albuterol (VENTOLIN HFA) 108 (90 Base) MCG/ACT inhaler albuterol sulfate HFA 90 mcg/actuation aerosol inhaler  INL 2 PFS PO Q 4 TO 6 H PRF WHZ OR SOB     amLODipine (NORVASC) 5 MG tablet Take 5 mg by mouth daily.     aspirin EC 81 MG tablet Take 81 mg by mouth daily. Swallow whole.     cholecalciferol (VITAMIN D3) 25 MCG (1000 UNIT) tablet Take  1,000 Units by mouth daily.     Ferrous Sulfate (IRON PO) Take 1 tablet by mouth daily.     furosemide (LASIX) 20 MG tablet TAKE 1 TABLET(20 MG) BY MOUTH DAILY 30 tablet 3   levothyroxine (SYNTHROID, LEVOTHROID) 200 MCG tablet TAKE ONE TABLET BY MOUTH ONE TIME DAILY before breakfast 30 tablet 11   losartan (COZAAR) 100 MG tablet Take 100 mg by mouth daily.     montelukast (SINGULAIR) 10 MG tablet Take 10 mg by mouth in the morning.     OXYGEN Inhale 2.5 L into the lungs continuous.     VITAMIN A  PO Take 2 capsules by mouth daily.     vitamin C (ASCORBIC ACID) 500 MG tablet Take 500 mg by mouth daily.     Vitamin D, Ergocalciferol, (DRISDOL) 1.25 MG (50000 UNIT) CAPS capsule Take 50,000 Units by mouth once a week. Take on Thursdays       Scheduled Meds: Continuous Infusions:  cefTRIAXone (ROCEPHIN)  IV     PRN Meds:.  Allergies:  Allergies  Allergen Reactions   Gabapentin Swelling    Legs    Mold Extract [Trichophyton] Nausea And Vomiting and Other (See Comments)    Sneezing real bad    Morphine Itching and Other (See Comments)    back hurts   Codeine Itching and Other (See Comments)    Back hurts    Family History  Problem Relation Age of Onset   Hypertension Mother    Cancer Mother        Brain/Lung/ smoker   Diabetes Mother    Stroke Father    Hypertension Father    ADD / ADHD Daughter        ADHD   Thyroid disease Brother    Hypertension Brother    Varicose Veins Brother    Peripheral vascular disease Brother    Heart attack Maternal Grandfather    Alcohol abuse Paternal Grandfather    Breast cancer Maternal Grandmother    Breast cancer Paternal Aunt    Breast cancer Paternal Aunt     Social History:  reports that she quit smoking about 24 years ago. Her smoking use included cigarettes. She started smoking about 45 years ago. She has a 3.00 pack-year smoking history. She has never used smokeless tobacco. She reports current alcohol use. She reports that she does  not use drugs.  ROS: A complete review of systems was performed.  All systems are negative except for pertinent findings as noted.  Physical Exam:  Vital signs in last 24 hours: Temp:  [97.8 F (36.6 C)-99 F (37.2 C)] 98.8 F (37.1 C) (01/12 0053) Pulse Rate:  [94-124] 109 (01/12 0415) Resp:  [18-25] 25 (01/12 0415) BP: (99-197)/(56-110) 110/64 (01/12 0415) SpO2:  [92 %-99 %] 95 % (01/12 0415) Weight:  [140.6 kg] 140.6 kg (01/11 2250) Constitutional:  Alert and oriented, No acute distress Cardiovascular: Regular rate and rhythm,  Respiratory: Normal respiratory effort, Lungs clear bilaterally GI: Abdomen is soft, obese no rebound tenderness GU: Right CVA tenderness Lymphatic: No lymphadenopathy Neurologic: Grossly intact, no focal deficits Psychiatric: Normal mood and affect  Laboratory Data:  Recent Labs    04/10/22 2330  WBC 14.4*  HGB 14.4  HCT 44.7  PLT 275    Recent Labs    04/10/22 2330  NA 131*  K 4.0  CL 95*  GLUCOSE 116*  BUN 16  CALCIUM 9.3  CREATININE 0.94     Results for orders placed or performed during the hospital encounter of 04/10/22 (from the past 24 hour(s))  Urinalysis, Routine w reflex microscopic Urine, Clean Catch     Status: Abnormal   Collection Time: 04/10/22 11:00 PM  Result Value Ref Range   Color, Urine YELLOW YELLOW   APPearance HAZY (A) CLEAR   Specific Gravity, Urine 1.016 1.005 - 1.030   pH 5.0 5.0 - 8.0   Glucose, UA NEGATIVE NEGATIVE mg/dL   Hgb urine dipstick SMALL (A) NEGATIVE   Bilirubin Urine NEGATIVE NEGATIVE   Ketones, ur NEGATIVE NEGATIVE mg/dL   Protein, ur 30 (A) NEGATIVE mg/dL   Nitrite POSITIVE (A)  NEGATIVE   Leukocytes,Ua TRACE (A) NEGATIVE   RBC / HPF 0-5 0 - 5 RBC/hpf   WBC, UA 11-20 0 - 5 WBC/hpf   Bacteria, UA MANY (A) NONE SEEN   Squamous Epithelial / HPF 0-5 0 - 5 /HPF   Mucus PRESENT   Comprehensive metabolic panel     Status: Abnormal   Collection Time: 04/10/22 11:30 PM  Result Value Ref  Range   Sodium 131 (L) 135 - 145 mmol/L   Potassium 4.0 3.5 - 5.1 mmol/L   Chloride 95 (L) 98 - 111 mmol/L   CO2 23 22 - 32 mmol/L   Glucose, Bld 116 (H) 70 - 99 mg/dL   BUN 16 8 - 23 mg/dL   Creatinine, Ser 0.94 0.44 - 1.00 mg/dL   Calcium 9.3 8.9 - 10.3 mg/dL   Total Protein 7.6 6.5 - 8.1 g/dL   Albumin 4.2 3.5 - 5.0 g/dL   AST 49 (H) 15 - 41 U/L   ALT 31 0 - 44 U/L   Alkaline Phosphatase 102 38 - 126 U/L   Total Bilirubin 1.3 (H) 0.3 - 1.2 mg/dL   GFR, Estimated >60 >60 mL/min   Anion gap 13 5 - 15  CBC with Differential     Status: Abnormal   Collection Time: 04/10/22 11:30 PM  Result Value Ref Range   WBC 14.4 (H) 4.0 - 10.5 K/uL   RBC 4.70 3.87 - 5.11 MIL/uL   Hemoglobin 14.4 12.0 - 15.0 g/dL   HCT 44.7 36.0 - 46.0 %   MCV 95.1 80.0 - 100.0 fL   MCH 30.6 26.0 - 34.0 pg   MCHC 32.2 30.0 - 36.0 g/dL   RDW 13.9 11.5 - 15.5 %   Platelets 275 150 - 400 K/uL   nRBC 0.0 0.0 - 0.2 %   Neutrophils Relative % 95 %   Neutro Abs 13.6 (H) 1.7 - 7.7 K/uL   Lymphocytes Relative 3 %   Lymphs Abs 0.5 (L) 0.7 - 4.0 K/uL   Monocytes Relative 2 %   Monocytes Absolute 0.2 0.1 - 1.0 K/uL   Eosinophils Relative 0 %   Eosinophils Absolute 0.0 0.0 - 0.5 K/uL   Basophils Relative 0 %   Basophils Absolute 0.0 0.0 - 0.1 K/uL   Immature Granulocytes 0 %   Abs Immature Granulocytes 0.06 0.00 - 0.07 K/uL   No results found for this or any previous visit (from the past 240 hour(s)).  Renal Function: Recent Labs    04/10/22 2330  CREATININE 0.94   Estimated Creatinine Clearance: 81.7 mL/min (by C-G formula based on SCr of 0.94 mg/dL).  Radiologic Imaging: CT Renal Stone Study  Result Date: 04/11/2022 CLINICAL DATA:  Severe right lower back pain for 3 days. Painful urination with dark urine and increased frequency. EXAM: CT ABDOMEN AND PELVIS WITHOUT CONTRAST TECHNIQUE: Multidetector CT imaging of the abdomen and pelvis was performed following the standard protocol without IV contrast.  RADIATION DOSE REDUCTION: This exam was performed according to the departmental dose-optimization program which includes automated exposure control, adjustment of the mA and/or kV according to patient size and/or use of iterative reconstruction technique. COMPARISON:  01/02/2021 FINDINGS: Lower chest: No acute abnormality. Hepatobiliary: No focal liver abnormality is seen. Status post cholecystectomy. No biliary dilatation. Pancreas: Unremarkable. No pancreatic ductal dilatation or surrounding inflammatory changes. Spleen: Normal in size without focal abnormality. Adrenals/Urinary Tract: Normal adrenal glands bilateral cortical renal scarring greater on the right parenchymal calcifications versus nonobstructing calyceal stones  in the upper pole of the right kidney. 1.0 cm stone in the right ureteropelvic junction with upstream moderate hydroureteronephrosis nephrosis nephrosis. Asymmetric stranding about the right kidney. The left kidney contains no urinary calculi. No left hydronephrosis. Unremarkable bladder. Stomach/Bowel: Colonic diverticulosis without diverticulitis. Normal caliber large and small bowel. Postoperative change of sleeve gastrectomy. Vascular/Lymphatic: Aortic atherosclerosis. No enlarged abdominal or pelvic lymph nodes. Reproductive: Uterus and bilateral adnexa are unremarkable. Other: No free intraperitoneal fluid or air Musculoskeletal: Nonspecific edema within the anterior abdominal wall pannus with skin thickening. Thoracolumbar spondylosis. IMPRESSION: Obstructing 1.0 cm stone in the right ureteropelvic junction with moderate right hydronephrosis. Nonspecific edema with the in the anterior abdominal wall pannus with skin thickening. Electronically Signed   By: Placido Sou M.D.   On: 04/11/2022 04:00   DG Chest 2 View  Result Date: 04/10/2022 CLINICAL DATA:  Shortness of breath. EXAM: CHEST - 2 VIEW COMPARISON:  June 13, 2020 FINDINGS: There is stable mild to moderate severity  enlargement of the cardiac silhouette. Prominent pericardial fat is seen on the right. Mildly increased lung markings are seen with mild areas of atelectasis and/or infiltrate noted within the bilateral lung bases. There is no evidence of a pleural effusion or pneumothorax. Radiopaque surgical clips are seen within the right upper quadrant. Multilevel degenerative changes seen throughout the thoracic spine. IMPRESSION: Mild bibasilar atelectasis and/or infiltrate. Electronically Signed   By: Virgina Norfolk M.D.   On: 04/10/2022 23:50    I independently reviewed the above imaging studies.  Impression/Recommendation: 1.  Right ureteropelvic junction stone with obstruction and impending urosepsis Plan/recommendation.  Scheduled for urgent cystoscopy and right JJ stent placement.  Be admitted to hospital service for IV antibiotics.  Will likely need retrograde ureteroscopy and laser lithotripsy for definitive management once medically stable.  Remi Haggard 04/11/2022, 4:57 AM     CC: Dr. Jeffie Pollock

## 2022-04-11 NOTE — Anesthesia Procedure Notes (Signed)
Procedure Name: Intubation Date/Time: 04/11/2022 5:42 AM  Performed by: Harlie Buening T, CRNAPre-anesthesia Checklist: Patient identified, Emergency Drugs available, Suction available and Patient being monitored Patient Re-evaluated:Patient Re-evaluated prior to induction Oxygen Delivery Method: Circle system utilized Preoxygenation: Pre-oxygenation with 100% oxygen Induction Type: IV induction Ventilation: Mask ventilation without difficulty Laryngoscope Size: Mac and 3 Grade View: Grade I Tube type: Oral Tube size: 7.0 mm Number of attempts: 1 Airway Equipment and Method: Stylet and Oral airway Placement Confirmation: ETT inserted through vocal cords under direct vision, positive ETCO2 and breath sounds checked- equal and bilateral Secured at: 22 cm Tube secured with: Tape Dental Injury: Teeth and Oropharynx as per pre-operative assessment

## 2022-04-12 ENCOUNTER — Inpatient Hospital Stay (HOSPITAL_COMMUNITY): Payer: Medicare Other

## 2022-04-12 ENCOUNTER — Encounter (HOSPITAL_COMMUNITY): Payer: Self-pay | Admitting: Urology

## 2022-04-12 DIAGNOSIS — I5033 Acute on chronic diastolic (congestive) heart failure: Secondary | ICD-10-CM | POA: Diagnosis not present

## 2022-04-12 DIAGNOSIS — N390 Urinary tract infection, site not specified: Secondary | ICD-10-CM | POA: Diagnosis not present

## 2022-04-12 DIAGNOSIS — N201 Calculus of ureter: Secondary | ICD-10-CM | POA: Diagnosis not present

## 2022-04-12 DIAGNOSIS — J9601 Acute respiratory failure with hypoxia: Secondary | ICD-10-CM | POA: Diagnosis not present

## 2022-04-12 LAB — BASIC METABOLIC PANEL
Anion gap: 14 (ref 5–15)
BUN: 17 mg/dL (ref 8–23)
CO2: 29 mmol/L (ref 22–32)
Calcium: 8.9 mg/dL (ref 8.9–10.3)
Chloride: 94 mmol/L — ABNORMAL LOW (ref 98–111)
Creatinine, Ser: 0.75 mg/dL (ref 0.44–1.00)
GFR, Estimated: >60 mL/min (ref >60)
Glucose, Bld: 153 mg/dL — ABNORMAL HIGH (ref 70–99)
Potassium: 4.4 mmol/L (ref 3.5–5.1)
Sodium: 137 mmol/L (ref 135–145)

## 2022-04-12 LAB — PROCALCITONIN: Procalcitonin: 16.79 ng/mL

## 2022-04-12 LAB — BRAIN NATRIURETIC PEPTIDE: B Natriuretic Peptide: 199.3 pg/mL — ABNORMAL HIGH (ref 0.0–100.0)

## 2022-04-12 MED ORDER — FUROSEMIDE 10 MG/ML IJ SOLN
40.0000 mg | Freq: Two times a day (BID) | INTRAMUSCULAR | Status: DC
  Administered 2022-04-12 – 2022-04-13 (×2): 40 mg via INTRAVENOUS
  Filled 2022-04-12 (×2): qty 4

## 2022-04-12 MED ORDER — METRONIDAZOLE 500 MG/100ML IV SOLN
500.0000 mg | Freq: Two times a day (BID) | INTRAVENOUS | Status: AC
  Administered 2022-04-12 – 2022-04-17 (×10): 500 mg via INTRAVENOUS
  Filled 2022-04-12 (×10): qty 100

## 2022-04-12 MED ORDER — IPRATROPIUM-ALBUTEROL 0.5-2.5 (3) MG/3ML IN SOLN
3.0000 mL | RESPIRATORY_TRACT | Status: DC | PRN
  Administered 2022-04-14: 3 mL via RESPIRATORY_TRACT
  Filled 2022-04-12: qty 3

## 2022-04-12 MED ORDER — FUROSEMIDE 10 MG/ML IJ SOLN: 40.0000 mg | Freq: Two times a day (BID) | INTRAMUSCULAR | Status: DC

## 2022-04-12 NOTE — Evaluation (Addendum)
Physical Therapy Evaluation Patient Details Name: Tricia Perry MRN: [id number] DOB: 12-01-1955 Today's Date: 04/12/2022  History of Present Illness  Patient is a 67 y.o.  female admitted 1/11 presented with right flank pain-was found to have right UPJ junction stone with obstruction causing complicated UTI with sepsis physiology.  undersent cystoscopy with stent.  PMH:  chronic hypoxic/hypercarbic respiratory failure on around 3 L of oxygen at home-trilogy machine at night, chronic HFpEF, morbidly obese  Clinical Impression  Pt admitted with above diagnosis. Pt with very poor respiratory reserve as noted pt desaturated while combing her hair and talking with this PT when PT entered room. Pt with poor endurance for activity limited to standing to RW for less than a minute and desaturation to low 80s with 6LO2 in place. Took incr time to return to high 80's and notified nursing .  Husband states that her pulse ox on 3.5 L at home is 93% therefore this is different than baseline. Pt with poor endurance and strength.  Husband committed to taking pt home.  Will follow acutely.  Pt currently with functional limitations due to the deficits listed below (see PT Problem List). Pt will benefit from skilled PT to increase their independence and safety with mobility to allow discharge to the venue listed below.          Recommendations for follow up therapy are one component of a multi-disciplinary discharge planning process, led by the attending physician.  Recommendations may be updated based on patient status, additional functional criteria and insurance authorization.  Follow Up Recommendations Home health PT (husband wants to take pt home)      Assistance Recommended at Discharge Frequent or constant Supervision/Assistance  Patient can return home with the following  A lot of help with walking and/or transfers;A lot of help with bathing/dressing/bathroom;Assistance with cooking/housework;Assist for  transportation;Help with stairs or ramp for entrance    Equipment Recommendations None recommended by PT  Recommendations for Other Services       Functional Status Assessment Patient has had a recent decline in their functional status and demonstrates the ability to make significant improvements in function in a reasonable and predictable amount of time.     Precautions / Restrictions Precautions Precautions: Fall Precaution Comments: watch HR, 3L O2 (on home O2) Restrictions Weight Bearing Restrictions: No      Mobility  Bed Mobility               General bed mobility comments: pt in recliner upon arrival    Transfers Overall transfer level: Needs assistance   Transfers: Sit to/from Stand Sit to Stand: Min assist           General transfer comment: sit - stand min A from recliner to RW with pt standing for up to 1 min prior to needing to sit. Pt DOE 4/4 and sats down to 81% on 6LO2 and took incr time to return to high 80's.  Notified nurse.    Ambulation/Gait Ambulation/Gait assistance: Min assist Gait Distance (Feet): 2 Feet Assistive device: Rolling walker (2 wheels) Gait Pattern/deviations: Decreased stride length, Shuffle, Trunk flexed, Wide base of support   Gait velocity interpretation: <1.31 ft/sec, indicative of household ambulator   General Gait Details: Took 2 steps forward and back 3 x and then sat down.  Needed min assist for safety with RW with pt trying to lean on elbows on RW when fatigued. Desaturation to low 80's and difficulty coming back up to high 80s.  Stairs  Wheelchair Mobility    Modified Rankin (Stroke Patients Only)       Balance                                             Pertinent Vitals/Pain Pain Assessment Pain Assessment: Faces Faces Pain Scale: Hurts even more Pain Location: back Pain Descriptors / Indicators: Aching Pain Intervention(s): Limited activity within patient's  tolerance, Monitored during session, Repositioned    Home Living Family/patient expects to be discharged to:: Private residence Living Arrangements: Spouse/significant other;Children Available Help at Discharge: Family;Available 24 hours/day Type of Home: House Home Access: Ramped entrance       Home Layout: One level Home Equipment: Conservation officer, nature (2 wheels);Cane - single point;BSC/3in1;Shower seat;Grab bars - toilet;Adaptive equipment (3Lhome O2)      Prior Function Prior Level of Function : Needs assist             Mobility Comments: pt reports using RW for mobility ADLs Comments: Pt reports assist from husband for bathing and dressing     Hand Dominance   Dominant Hand: Right    Extremity/Trunk Assessment   Upper Extremity Assessment Upper Extremity Assessment: Defer to OT evaluation RUE Deficits / Details: shoulder flexion limited 0-80 degrees LUE Deficits / Details: shoulder flexion limited 0-80 degrees    Lower Extremity Assessment Lower Extremity Assessment: Overall WFL for tasks assessed    Cervical / Trunk Assessment Cervical / Trunk Assessment: Kyphotic  Communication   Communication: No difficulties  Cognition Arousal/Alertness: Awake/alert Behavior During Therapy: WFL for tasks assessed/performed Overall Cognitive Status: Within Functional Limits for tasks assessed                                          General Comments      Exercises General Exercises - Lower Extremity Long Arc Quad: AROM, Both, 10 reps, Seated   Assessment/Plan    PT Assessment Patient needs continued PT services  PT Problem List Decreased activity tolerance;Decreased balance;Decreased mobility;Decreased knowledge of use of DME;Cardiopulmonary status limiting activity;Decreased safety awareness;Decreased knowledge of precautions;Obesity       PT Treatment Interventions DME instruction;Gait training;Functional mobility training;Therapeutic  activities;Therapeutic exercise;Balance training;Patient/family education    PT Goals (Current goals can be found in the Care Plan section)  Acute Rehab PT Goals Patient Stated Goal: to go home PT Goal Formulation: With patient Time For Goal Achievement: 04/26/22 Potential to Achieve Goals: Good    Frequency Min 3X/week     Co-evaluation               AM-PAC PT "6 Clicks" Mobility  Outcome Measure Help needed turning from your back to your side while in a flat bed without using bedrails?: A Little Help needed moving from lying on your back to sitting on the side of a flat bed without using bedrails?: A Lot Help needed moving to and from a bed to a chair (including a wheelchair)?: A Lot Help needed standing up from a chair using your arms (e.g., wheelchair or bedside chair)?: A Lot Help needed to walk in hospital room?: Total Help needed climbing 3-5 steps with a railing? : Total 6 Click Score: 11    End of Session Equipment Utilized During Treatment: Gait belt;Oxygen Activity Tolerance: Patient limited by fatigue  Patient left: in chair;with call bell/phone within reach;with chair alarm set;with family/visitor present Nurse Communication: Mobility status PT Visit Diagnosis: Unsteadiness on feet (R26.81);Difficulty in walking, not elsewhere classified (R26.2)    Time: 6742-5525 PT Time Calculation (min) (ACUTE ONLY): 14 min   Charges:   PT Evaluation $PT Eval Moderate Complexity: 1 Mod          Taleah Bellantoni M,PT Acute Rehab Services [phone]   Alvira Philips 04/12/2022, 2:59 PM

## 2022-04-12 NOTE — Progress Notes (Signed)
Mobility Specialist Progress Note:   04/12/22 1030  Mobility  Activity Turned to right side;Turned to left side;Turned to back - supine (for pericare)  Level of Assistance +2 (takes two people)  Activity Response Tolerated poorly  Mobility Referral No  $Mobility charge 1 Mobility   RN requesting assistance with pericare, as pt had BM in bed. Required mod-maxA to roll L&R. Pt c/o SOB and R leg pain throughout. Left with all needs met.   Nelta Numbers Mobility Specialist Please contact via SecureChat or  Rehab office at [phone]

## 2022-04-12 NOTE — Evaluation (Signed)
Occupational Therapy Evaluation Patient Details Name: Tricia Perry MRN: [id number] DOB: 02/01/1956 Today's Date: 04/12/2022   History of Present Illness Patient is a 67 y.o.  female with history of chronic hypoxic/hypercarbic respiratory failure on around 3 L of oxygen at home-trilogy machine at night, chronic HFpEF, morbidly obese who presented with right flank pain-was found to have right UPJ junction stone with obstruction causing complicated UTI with sepsis physiology.   Clinical Impression   Pt presents with decline in function and safety with ADLs and ADL mobility with impaired strength, balance and endurance. PTA pt lived at home with her husband and her daughter. Pt and husband report that pt requires assist with bathing and dressing at baseline, uses RW for mobility, meal preps while seated. Pt currently able to stand from recliner mod A but unable to safely attempt SPT to [facility] Pt with elevate HR at rest 108, increasing to 125 during sit - stand; pt on 3L O2 (on home O2 ay baseline). Pt would benefit from acute OT services to address impairments to maximize level of function and safety     Recommendations for follow up therapy are one component of a multi-disciplinary discharge planning process, led by the attending physician.  Recommendations may be updated based on patient status, additional functional criteria and insurance authorization.   Follow Up Recommendations  Home health OT     Assistance Recommended at Discharge Frequent or constant Supervision/Assistance  Patient can return home with the following A lot of help with bathing/dressing/bathroom;A lot of help with walking and/or transfers;Assist for transportation;Assistance with cooking/housework    Functional Status Assessment  Patient has had a recent decline in their functional status and demonstrates the ability to make significant improvements in function in a reasonable and predictable amount of time.  Equipment  Recommendations  None recommended by OT    Recommendations for Other Services       Precautions / Restrictions Precautions Precautions: Fall Precaution Comments: watch HR, 3L O2 (on home O2) Restrictions Weight Bearing Restrictions: No      Mobility Bed Mobility               General bed mobility comments: pt in recliner upon arrival    Transfers Overall transfer level: Needs assistance   Transfers: Sit to/from Stand Sit to Stand: Mod assist           General transfer comment: sit - stand mod A from recliner, unable t safely attempt SPT      Balance Overall balance assessment: Needs assistance Sitting-balance support: Feet supported Sitting balance-Leahy Scale: Fair     Standing balance support: Bilateral upper extremity supported Standing balance-Leahy Scale: Poor                             ADL either performed or assessed with clinical judgement   ADL Overall ADL's : Needs assistance/impaired Eating/Feeding: Independent   Grooming: Wash/dry face;Wash/dry hands;Set up;Sitting   Upper Body Bathing: Sitting;Moderate assistance   Lower Body Bathing: Maximal assistance;With caregiver independent assisting Lower Body Bathing Details (indicate cue type and reason): at baseline Upper Body Dressing : Moderate assistance;Sitting   Lower Body Dressing: Total assistance;With caregiver independent assisting Lower Body Dressing Details (indicate cue type and reason): at baseline   Toilet Transfer Details (indicate cue type and reason): sit - stand from recliner, unable to maintin safely to attemot SPT Toileting- Clothing Manipulation and Hygiene: Total assistance  Vision Baseline Vision/History: 1 Wears glasses Patient Visual Report: No change from baseline       Perception     Praxis      Pertinent Vitals/Pain Pain Assessment Pain Assessment: 0-10 Pain Location: back Pain Descriptors / Indicators: Aching Pain  Intervention(s): Monitored during session, Premedicated before session     Hand Dominance Right   Extremity/Trunk Assessment Upper Extremity Assessment Upper Extremity Assessment: Generalized weakness;RUE deficits/detail;LUE deficits/detail RUE Deficits / Details: shoulder flexion limited 0-80 degrees LUE Deficits / Details: shoulder flexion limited 0-80 degrees   Lower Extremity Assessment Lower Extremity Assessment: Defer to PT evaluation       Communication Communication Communication: No difficulties   Cognition Arousal/Alertness: Awake/alert Behavior During Therapy: WFL for tasks assessed/performed Overall Cognitive Status: Within Functional Limits for tasks assessed                                       General Comments       Exercises     Shoulder Instructions      Home Living Family/patient expects to be discharged to:: Private residence Living Arrangements: Spouse/significant other;Children Available Help at Discharge: Family;Available 24 hours/day Type of Home: House Home Access: Ramped entrance     Home Layout: One level     Bathroom Shower/Tub: Occupational psychologist: Handicapped height     Home Equipment: Conservation officer, nature (2 wheels);Cane - single point;BSC/3in1;Shower seat;Grab bars - toilet;Adaptive equipment Adaptive Equipment: Reacher        Prior Functioning/Environment Prior Level of Function : Needs assist             Mobility Comments: pt reports using RW for mobility ADLs Comments: Pt reports assist from [place] dfor bathing and dressing        OT Problem List: Decreased strength;Decreased activity tolerance;Impaired balance (sitting and/or standing);Decreased range of motion;Obesity;Pain      OT Treatment/Interventions: Self-care/ADL training;Balance training;DME and/or AE instruction;Therapeutic exercise;Neuromuscular education;Therapeutic activities;Patient/family education    OT Goals(Current goals  can be found in the care plan section) Acute Rehab OT Goals Patient Stated Goal: go home OT Goal Formulation: With patient/family Time For Goal Achievement: 04/26/22 Potential to Achieve Goals: Good ADL Goals Pt Will Perform Grooming: with set-up;with modified independence;sitting Pt Will Perform Upper Body Bathing: with min assist;with caregiver independent in assisting;sitting Pt Will Perform Upper Body Dressing: with min assist;with min guard assist;sitting Pt Will Transfer to Toilet: with mod assist;stand pivot transfer;bedside commode  OT Frequency: Min 2X/week    Co-evaluation              AM-PAC OT "6 Clicks" Daily Activity     Outcome Measure Help from another person eating meals?: None Help from another person taking care of personal grooming?: A Little Help from another person toileting, which includes using toliet, bedpan, or urinal?: Total Help from another person bathing (including washing, rinsing, drying)?: A Lot Help from another person to put on and taking off regular upper body clothing?: A Lot Help from another person to put on and taking off regular lower body clothing?: Total 6 Click Score: 13   End of Session Equipment Utilized During Treatment: Gait belt  Activity Tolerance: Patient limited by fatigue Patient left: in chair;with family/visitor present  OT Visit Diagnosis: Unsteadiness on feet (R26.81);Other abnormalities of gait and mobility (R26.89);Muscle weakness (generalized) (M62.81);Pain Pain - part of body:  (back)  Time: 1351-1416 OT Time Calculation (min): 25 min Charges:  OT General Charges $OT Visit: 1 Visit OT Evaluation $OT Eval Moderate Complexity: 1 Mod OT Treatments $Therapeutic Activity: 8-22 mins    Britt Bottom 04/12/2022, 2:32 PM

## 2022-04-12 NOTE — Progress Notes (Signed)
PROGRESS NOTE        PATIENT DETAILS Name: Tricia Perry Age: 67 y.o. Sex: female Date of Birth: 1956/02/08 Admit Date: 04/10/2022 Admitting Physician Evalee Mutton Kristeen Mans, MD [id number], Truddie Crumble, PA-C  Brief Summary: Patient is a 67 y.o.  female with history of chronic hypoxic/hypercarbic respiratory failure on around 3 L of oxygen at home-trilogy machine at night, chronic HFpEF, morbidly obese who presented with right flank pain-was found to have right UPJ junction stone with obstruction causing complicated UTI with sepsis physiology.  Significant events: 1/11>> admit to [facility]-right UPJ stone with hydronephrosis-sepsis/complicated UTI 2/83>> cystoscopy/right JJ stent placement.  Postoperatively worsening hypoxemia-briefly required BiPAP-given Lasix-transitioned to HFNC-8-10 L.  CXR with significant infiltrates. 1/13>> down to 5 L of HFNC  Significant studies: 1/11>> CXR: Mild bibasilar atelectasis/infiltrate 1/12>> CT renal stone study: Obstructing 1.0 cm stone right UPJ junction with moderate right hydronephrosis. 1/12>> CXR: Markedly increased bilateral airspace opacities 1/13>> CXR: Extensive patchy bilateral airspace disease.  Significant microbiology data: 1/12>> urine culture: Pending 1/12>> blood culture: neg  Procedures: 1/12>>Cystoscopy, right retrograde pyelogram with intraoperative interpretation, insertion right JJ stent   Consults: Urology  Subjective: Overall feels better but complains of feeling congested in the chest/upper abdominal area.  Had a BM earlier this morning.  Objective: Vitals: Blood pressure (!) 112/56, pulse (!) 111, temperature 98.7 F (37.1 C), temperature source Oral, resp. rate (!) 24, height '5\' 4"'$  (1.626 m), weight (!) 140.6 kg, SpO2 91 %.   Exam: Gen Exam:Alert awake-not in any distress HEENT:atraumatic, normocephalic Chest: B/L clear to auscultation anteriorly CVS:S1S2 regular Abdomen:soft non tender,  non distended Extremities:+ edema Neurology: Non focal Skin: no rash  Pertinent Labs/Radiology:    Latest Ref Rng & Units 04/11/2022   12:59 PM 04/11/2022   10:03 AM 04/10/2022   11:30 PM  CBC  WBC 4.0 - 10.5 K/uL 24.2  24.7  14.4   Hemoglobin 12.0 - 15.0 g/dL 12.8  13.0  14.4   Hematocrit 36.0 - 46.0 % 41.4  40.8  44.7   Platelets 150 - 400 K/uL 237  235  275     Lab Results  Component Value Date   NA 137 04/12/2022   K 4.4 04/12/2022   CL 94 (L) 04/12/2022   CO2 29 04/12/2022      Assessment/Plan: Sepsis due to complicated UTI in the setting of right UPJ stone with obstruction/hydronephrosis Sepsis physiology better Continue empiric IV Rocephin Blood cultures negative so far-urine cultures remain pending.  Right UPJ stone with hydronephrosis S/p cystoscopy with stent placement Urology following  Acute on chronic hypoxic respiratory failure HFpEF exac Possible aspiration episode postoperatively Hypoxia worsened postoperatively-initially felt to be CHF exacerbation-as chest x-ray looked markedly worse compared to on admission.   Given IV Lasix, significant urine output of 4.2 L overnight-however chest x-ray appears essentially unchanged-hypoxia somewhat better-Down to 5 L today.  Unclear if patient also had a aspiration episode postoperatively. Plan would be to continue IV diuretics-but will add Flagyl to cover any anaerobes-in case she did aspirate. Repeat CXR tomorrow morning Continue to follow electrolytes/daily weights/intake/output.   HTN BP initially soft on 1/12-now slowly creeping up Will continue to hold all antihypertensives-allow room for diuresis.  Soft  Chronic hypoxic/hypercarbic respiratory failure OSA/OHS On 2.5-3 L of oxygen during the daytime, and trilogy ventilator nightly BIPAP qhs here while hospitalized  History of  asthma Not in exacerbation Continue bronchodilators  Hypothyroidism Continue levothyroxine  Morbid  obesity Lymphedema Supportive care Diuretics as able  Morbid Obesity: Estimated body mass index is 53.21 kg/m as calculated from the following:   Height as of this encounter: '5\' 4"'$  (1.626 m).   Weight as of this encounter: 140.6 kg.   Code status:   Code Status: Full Code   DVT Prophylaxis: enoxaparin (LOVENOX) injection 40 mg Start: 04/11/22 1000   Family Communication:  Spouse at bedside.   Disposition Plan: Status is: Inpatient Remains inpatient appropriate because: Severity of illness   Planned Discharge Destination:Home health     Diet: Diet Order             Diet Heart Room service appropriate? Yes; Fluid consistency: Thin; Fluid restriction: 1500 mL Fluid  Diet effective now                     Antimicrobial agents: Anti-infectives (From admission, onward)    Start     Dose/Rate Route Frequency Ordered Stop   04/11/22 1000  cefTRIAXone (ROCEPHIN) 2 g in sodium chloride 0.9 % 100 mL IVPB        2 g 200 mL/hr over 30 Minutes Intravenous Every 24 hours 04/11/22 0455     04/11/22 0300  cefTRIAXone (ROCEPHIN) 1 g in sodium chloride 0.9 % 100 mL IVPB        1 g 200 mL/hr over 30 Minutes Intravenous  Once 04/11/22 0249 04/11/22 0433        MEDICATIONS: Scheduled Meds:  Chlorhexidine Gluconate Cloth  6 each Topical Daily   enoxaparin (LOVENOX) injection  40 mg Subcutaneous Daily   ferrous sulfate  325 mg Oral Q breakfast   fluticasone furoate-vilanterol  1 puff Inhalation Daily   ipratropium-albuterol  3 mL Nebulization TID   levothyroxine  200 mcg Oral Q0600   montelukast  10 mg Oral Daily   senna-docusate  1 tablet Oral QHS   Continuous Infusions:  cefTRIAXone (ROCEPHIN)  IV 2 g (04/12/22 0806)   PRN Meds:.acetaminophen, HYDROmorphone (DILAUDID) injection, mouth rinse, oxyCODONE, polyethylene glycol   I have personally reviewed following labs and imaging studies  LABORATORY DATA: CBC: Recent Labs  Lab 04/10/22 2330 04/11/22 1003  04/11/22 1259  WBC 14.4* 24.7* 24.2*  NEUTROABS 13.6*  --  22.1*  HGB 14.4 13.0 12.8  HCT 44.7 40.8 41.4  MCV 95.1 95.1 97.0  PLT 275 235 237     Basic Metabolic Panel: Recent Labs  Lab 04/10/22 2330 04/11/22 1003 04/12/22 0156  NA 131* 134* 137  K 4.0 4.4 4.4  CL 95* 97* 94*  CO2 '23 25 29  '$ GLUCOSE 116* 155* 153*  BUN '16 14 17  '$ CREATININE 0.94 0.99 0.75  CALCIUM 9.3 8.8* 8.9  MG  --  1.8  --   PHOS  --  4.3  --      GFR: Estimated Creatinine Clearance: 96 mL/min (by C-G formula based on SCr of 0.75 mg/dL).  Liver Function Tests: Recent Labs  Lab 04/10/22 2330  AST 49*  ALT 31  ALKPHOS 102  BILITOT 1.3*  PROT 7.6  ALBUMIN 4.2    No results for input(s): "LIPASE", "AMYLASE" in the last 168 hours. No results for input(s): "AMMONIA" in the last 168 hours.  Coagulation Profile: No results for input(s): "INR", "PROTIME" in the last 168 hours.  Cardiac Enzymes: No results for input(s): "CKTOTAL", "CKMB", "CKMBINDEX", "TROPONINI" in the last 168 hours.  BNP (last  3 results) No results for input(s): "PROBNP" in the last 8760 hours.  Lipid Profile: No results for input(s): "CHOL", "HDL", "LDLCALC", "TRIG", "CHOLHDL", "LDLDIRECT" in the last 72 hours.  Thyroid Function Tests: No results for input(s): "TSH", "T4TOTAL", "FREET4", "T3FREE", "THYROIDAB" in the last 72 hours.  Anemia Panel: No results for input(s): "VITAMINB12", "FOLATE", "FERRITIN", "TIBC", "IRON", "RETICCTPCT" in the last 72 hours.  Urine analysis:    Component Value Date/Time   COLORURINE YELLOW 04/10/2022 2300   APPEARANCEUR HAZY (A) 04/10/2022 2300   LABSPEC 1.016 04/10/2022 2300   PHURINE 5.0 04/10/2022 2300   GLUCOSEU NEGATIVE 04/10/2022 2300   GLUCOSEU NEGATIVE 10/28/2012 1357   HGBUR SMALL (A) 04/10/2022 2300   BILIRUBINUR NEGATIVE 04/10/2022 2300   BILIRUBINUR negative 07/20/2014 1355   KETONESUR NEGATIVE 04/10/2022 2300   PROTEINUR 30 (A) 04/10/2022 2300   UROBILINOGEN 0.2  07/20/2014 1355   UROBILINOGEN 0.2 10/28/2012 1357   NITRITE POSITIVE (A) 04/10/2022 2300   LEUKOCYTESUR TRACE (A) 04/10/2022 2300    Sepsis Labs: Lactic Acid, Venous    Component Value Date/Time   LATICACIDVEN 1.3 04/21/2020 1204    MICROBIOLOGY: Recent Results (from the past 240 hour(s))  Culture, blood (Routine X 2) w Reflex to ID Panel     Status: None (Preliminary result)   Collection Time: 04/11/22  1:11 PM   Specimen: BLOOD  Result Value Ref Range Status   Specimen Description BLOOD BLOOD RIGHT HAND  Final   Special Requests AEROBIC BOTTLE ONLY Blood Culture adequate volume  Final   Culture   Final    NO GROWTH < 24 HOURS Performed at [facility], [place] [address], [place], [place] [address]    Report Status PENDING  Incomplete  Culture, blood (Routine X 2) w Reflex to ID Panel     Status: None (Preliminary result)   Collection Time: 04/11/22  1:11 PM   Specimen: BLOOD  Result Value Ref Range Status   Specimen Description BLOOD LEFT ANTECUBITAL  Final   Special Requests   Final    BOTTLES DRAWN AEROBIC AND ANAEROBIC Blood Culture adequate volume   Culture   Final    NO GROWTH < 24 HOURS Performed at [facility], [place] [address] [place], [place] [address]    Report Status PENDING  Incomplete  Resp panel by RT-PCR (RSV, Flu A&B, Covid) Anterior Nasal Swab     Status: None   Collection Time: 04/11/22  2:54 PM   Specimen: Anterior Nasal Swab  Result Value Ref Range Status   SARS Coronavirus 2 by RT PCR NEGATIVE NEGATIVE Final    Comment: (NOTE) SARS-CoV-2 target nucleic acids are NOT DETECTED.  The SARS-CoV-2 RNA is generally detectable in upper respiratory specimens during the acute phase of infection. The lowest concentration of SARS-CoV-2 viral copies this assay can detect is 138 copies/mL. A negative result does not preclude SARS-Cov-2 infection and should not be used as the sole basis for treatment or other patient management decisions. A  negative result may occur with  improper specimen collection/handling, submission of specimen other than nasopharyngeal swab, presence of viral mutation(s) within the areas targeted by this assay, and inadequate number of viral copies(<138 copies/mL). A negative result must be combined with clinical observations, patient history, and epidemiological information. The expected result is Negative.  Fact Sheet for Patients:  EntrepreneurPulse.com.au  Fact Sheet for Healthcare Providers:  IncredibleEmployment.be  This test is no t yet approved or cleared by the [place] and  has been authorized for detection and/or diagnosis of SARS-CoV-2 by FDA under an Emergency Use Authorization (EUA). This EUA will remain  in effect (meaning this test can be used) for the duration of the COVID-19 declaration under Section 564(b)(1) of the Act, 21 U.S.C.section 360bbb-3(b)(1), unless the authorization is terminated  or revoked sooner.       Influenza A by PCR NEGATIVE NEGATIVE Final   Influenza B by PCR NEGATIVE NEGATIVE Final    Comment: (NOTE) The Xpert Xpress SARS-CoV-2/FLU/RSV plus assay is intended as an aid in the diagnosis of influenza from Nasopharyngeal swab specimens and should not be used as a sole basis for treatment. Nasal washings and aspirates are unacceptable for Xpert Xpress SARS-CoV-2/FLU/RSV testing.  Fact Sheet for Patients: EntrepreneurPulse.com.au  Fact Sheet for Healthcare Providers: IncredibleEmployment.be  This test is not yet approved or cleared by the [place] FDA and has been authorized for detection and/or diagnosis of SARS-CoV-2 by FDA under an Emergency Use Authorization (EUA). This EUA will remain in effect (meaning this test can be used) for the duration of the COVID-19 declaration under Section 564(b)(1) of the Act, 21 U.S.C. section 360bbb-3(b)(1), unless the authorization  is terminated or revoked.     Resp Syncytial Virus by PCR NEGATIVE NEGATIVE Final    Comment: (NOTE) Fact Sheet for Patients: EntrepreneurPulse.com.au  Fact Sheet for Healthcare Providers: IncredibleEmployment.be  This test is not yet approved or cleared by the [place] FDA and has been authorized for detection and/or diagnosis of SARS-CoV-2 by FDA under an Emergency Use Authorization (EUA). This EUA will remain in effect (meaning this test can be used) for the duration of the COVID-19 declaration under Section 564(b)(1) of the Act, 21 U.S.C. section 360bbb-3(b)(1), unless the authorization is terminated or revoked.  Performed at [facility], [place] [address], [place], [place] [address]     RADIOLOGY STUDIES/RESULTS: DG Chest Port 1 View  Result Date: 04/12/2022 CLINICAL DATA:  Shortness of breath EXAM: PORTABLE CHEST 1 VIEW COMPARISON:  04/11/2022 FINDINGS: Patient is rotated. Stable cardiomediastinal contours. Low lung volumes. Extensive patchy bilateral airspace opacities, not appreciably changed. Probable small right pleural effusion. No evidence of a pneumothorax. IMPRESSION: Extensive patchy bilateral airspace opacities, not appreciably changed. Probable small right pleural effusion. Electronically Signed   By: Davina Poke D.O.   On: 04/12/2022 09:57   DG Chest Port 1V same Day  Result Date: 04/11/2022 CLINICAL DATA:  Shortness of breath EXAM: PORTABLE CHEST 1 VIEW COMPARISON:  Chest radiograph dated 04/10/2022 FINDINGS: Patient is similarly rotated to the right. Low lung volumes. Markedly increased bilateral airspace opacities. No definite pleural effusion or pneumothorax. Cardiac borders are obscured. The visualized skeletal structures are unremarkable. IMPRESSION: Markedly increased bilateral airspace opacities, which may represent pulmonary edema or infection. Electronically Signed   By: Darrin Nipper M.D.   On: 04/11/2022 12:21    DG C-Arm 1-60 Min  Result Date: 04/11/2022 CLINICAL DATA:  Fluoro guidance provided EXAM: DG C-ARM 1-60 MIN FINDINGS: Dose: 107.3 mGy Fluoro time 100s IMPRESSION: C-arm fluoro guidance provided. Electronically Signed   By: Sammie Bench M.D.   On: 04/11/2022 08:47   CT Renal Stone Study  Result Date: 04/11/2022 CLINICAL DATA:  Severe right lower back pain for 3 days. Painful urination with dark urine and increased frequency. EXAM: CT ABDOMEN AND PELVIS WITHOUT CONTRAST TECHNIQUE: Multidetector CT imaging of the abdomen and pelvis was performed following the standard protocol without IV contrast. RADIATION DOSE REDUCTION: This exam was performed according  to the departmental dose-optimization program which includes automated exposure control, adjustment of the mA and/or kV according to patient size and/or use of iterative reconstruction technique. COMPARISON:  01/02/2021 FINDINGS: Lower chest: No acute abnormality. Hepatobiliary: No focal liver abnormality is seen. Status post cholecystectomy. No biliary dilatation. Pancreas: Unremarkable. No pancreatic ductal dilatation or surrounding inflammatory changes. Spleen: Normal in size without focal abnormality. Adrenals/Urinary Tract: Normal adrenal glands bilateral cortical renal scarring greater on the right parenchymal calcifications versus nonobstructing calyceal stones in the upper pole of the right kidney. 1.0 cm stone in the right ureteropelvic junction with upstream moderate hydroureteronephrosis nephrosis nephrosis. Asymmetric stranding about the right kidney. The left kidney contains no urinary calculi. No left hydronephrosis. Unremarkable bladder. Stomach/Bowel: Colonic diverticulosis without diverticulitis. Normal caliber large and small bowel. Postoperative change of sleeve gastrectomy. Vascular/Lymphatic: Aortic atherosclerosis. No enlarged abdominal or pelvic lymph nodes. Reproductive: Uterus and bilateral adnexa are unremarkable. Other: No  free intraperitoneal fluid or air Musculoskeletal: Nonspecific edema within the anterior abdominal wall pannus with skin thickening. Thoracolumbar spondylosis. IMPRESSION: Obstructing 1.0 cm stone in the right ureteropelvic junction with moderate right hydronephrosis. Nonspecific edema with the in the anterior abdominal wall pannus with skin thickening. Electronically Signed   By: Placido Sou M.D.   On: 04/11/2022 04:00   DG Chest 2 View  Result Date: 04/10/2022 CLINICAL DATA:  Shortness of breath. EXAM: CHEST - 2 VIEW COMPARISON:  June 13, 2020 FINDINGS: There is stable mild to moderate severity enlargement of the cardiac silhouette. Prominent pericardial fat is seen on the right. Mildly increased lung markings are seen with mild areas of atelectasis and/or infiltrate noted within the bilateral lung bases. There is no evidence of a pleural effusion or pneumothorax. Radiopaque surgical clips are seen within the right upper quadrant. Multilevel degenerative changes seen throughout the thoracic spine. IMPRESSION: Mild bibasilar atelectasis and/or infiltrate. Electronically Signed   By: Virgina Norfolk M.D.   On: 04/10/2022 23:50     LOS: 1 day   Oren Binet, MD  [facility]    To contact the attending provider between 7A-7P or the covering provider during after hours 7P-7A, please log into the web site [email or web address] and access using universal [place] password for that web site. If you do not have the password, please call the hospital operator.  04/12/2022, 11:44 AM

## 2022-04-12 NOTE — Progress Notes (Signed)
Weight this morning is 179.6 kg on bed.  Weight is vastly higher than two days ago 140.6 kg.  Removed all equipment from bed.  Patient may need to have actual weight taken on scale.

## 2022-04-13 ENCOUNTER — Inpatient Hospital Stay (HOSPITAL_COMMUNITY): Payer: Medicare Other

## 2022-04-13 DIAGNOSIS — N201 Calculus of ureter: Secondary | ICD-10-CM | POA: Diagnosis not present

## 2022-04-13 LAB — CBC WITH DIFFERENTIAL/PLATELET
Abs Immature Granulocytes: 0.09 10*3/uL — ABNORMAL HIGH (ref 0.00–0.07)
Basophils Absolute: 0.1 10*3/uL (ref 0.0–0.1)
Basophils Relative: 0 %
Eosinophils Absolute: 0.1 10*3/uL (ref 0.0–0.5)
Eosinophils Relative: 0 %
HCT: 37.9 % (ref 36.0–46.0)
Hemoglobin: 12 g/dL (ref 12.0–15.0)
Immature Granulocytes: 1 %
Lymphocytes Relative: 12 %
Lymphs Abs: 1.6 10*3/uL (ref 0.7–4.0)
MCH: 30.7 pg (ref 26.0–34.0)
MCHC: 31.7 g/dL (ref 30.0–36.0)
MCV: 96.9 fL (ref 80.0–100.0)
Monocytes Absolute: 1.2 10*3/uL — ABNORMAL HIGH (ref 0.1–1.0)
Monocytes Relative: 8 %
Neutro Abs: 10.9 10*3/uL — ABNORMAL HIGH (ref 1.7–7.7)
Neutrophils Relative %: 79 %
Platelets: 245 10*3/uL (ref 150–400)
RBC: 3.91 MIL/uL (ref 3.87–5.11)
RDW: 14.6 % (ref 11.5–15.5)
WBC: 13.9 10*3/uL — ABNORMAL HIGH (ref 4.0–10.5)
nRBC: 0 % (ref 0.0–0.2)

## 2022-04-13 LAB — URINE CULTURE: Culture: >=100000 — AB

## 2022-04-13 LAB — BASIC METABOLIC PANEL
Anion gap: 11 (ref 5–15)
BUN: 19 mg/dL (ref 8–23)
CO2: 31 mmol/L (ref 22–32)
Calcium: 8.9 mg/dL (ref 8.9–10.3)
Chloride: 94 mmol/L — ABNORMAL LOW (ref 98–111)
Creatinine, Ser: 0.68 mg/dL (ref 0.44–1.00)
GFR, Estimated: >60 mL/min (ref >60)
Glucose, Bld: 137 mg/dL — ABNORMAL HIGH (ref 70–99)
Potassium: 3.8 mmol/L (ref 3.5–5.1)
Sodium: 136 mmol/L (ref 135–145)

## 2022-04-13 LAB — MAGNESIUM: Magnesium: 2 mg/dL (ref 1.7–2.4)

## 2022-04-13 LAB — BRAIN NATRIURETIC PEPTIDE: B Natriuretic Peptide: 74.4 pg/mL (ref 0.0–100.0)

## 2022-04-13 LAB — PROCALCITONIN: Procalcitonin: 6.93 ng/mL

## 2022-04-13 LAB — C-REACTIVE PROTEIN: CRP: 19.6 mg/dL — ABNORMAL HIGH (ref <1.0)

## 2022-04-13 MED ORDER — POTASSIUM CHLORIDE CRYS ER 20 MEQ PO TBCR
40.0000 meq | EXTENDED_RELEASE_TABLET | Freq: Once | ORAL | Status: AC
  Administered 2022-04-13: 40 meq via ORAL
  Filled 2022-04-13: qty 2

## 2022-04-13 MED ORDER — FUROSEMIDE 10 MG/ML IJ SOLN
60.0000 mg | Freq: Once | INTRAMUSCULAR | Status: AC
  Administered 2022-04-13: 60 mg via INTRAVENOUS
  Filled 2022-04-13: qty 6

## 2022-04-13 NOTE — Progress Notes (Signed)
PROGRESS NOTE        PATIENT DETAILS Name: Tricia Perry Age: 67 y.o. Sex: female Date of Birth: 03-Dec-1955 Admit Date: 04/10/2022 Admitting Physician Evalee Mutton Kristeen Mans, MD [id number], Truddie Crumble, PA-C  Brief Summary: Patient is a 67 y.o.  female with history of chronic hypoxic/hypercarbic respiratory failure on around 3 L of oxygen at home-trilogy machine at night, chronic HFpEF, morbidly obese who presented with right flank pain-was found to have right UPJ junction stone with obstruction causing complicated UTI with sepsis physiology.  Significant events: 1/11>> admit to [facility]-right UPJ stone with hydronephrosis-sepsis/complicated UTI 1/02>> cystoscopy/right JJ stent placement.  Postoperatively worsening hypoxemia-briefly required BiPAP-given Lasix-transitioned to HFNC-8-10 L.  CXR with significant infiltrates. 1/13>> down to 5 L of HFNC  Significant studies: 1/11>> CXR: Mild bibasilar atelectasis/infiltrate 1/12>> CT renal stone study: Obstructing 1.0 cm stone right UPJ junction with moderate right hydronephrosis. 1/12>> CXR: Markedly increased bilateral airspace opacities 1/13>> CXR: Extensive patchy bilateral airspace disease.  Significant microbiology data: 1/12>> urine culture: Pending 1/12>> blood culture: neg  Procedures: 1/12>>Cystoscopy, right retrograde pyelogram with intraoperative interpretation, insertion right JJ stent   Consults: Urology  Subjective:   Patient in bed, appears comfortable, denies any headache, no fever, no chest pain or pressure, improvedshortness of breath , no abdominal pain. No new focal weakness.  Active: Vitals: Blood pressure (!) 147/77, pulse (!) 105, temperature 99.7 F (37.6 C), temperature source Axillary, resp. rate 20, height '5\' 4"'$  (1.626 m), weight (!) 140.6 kg, SpO2 93 %.   Exam:  Awake Alert, No new F.N deficits, foley [place].AT,PERRAL Supple Neck, No JVD,   Symmetrical Chest wall movement, Good air  movement bilaterally, CTAB RRR,No Gallops, Rubs or new Murmurs,  +ve B.Sounds, Abd Soft, No tenderness,   No Cyanosis, Clubbing or edema    Assessment/Plan:  Sepsis due to complicated UTI in the setting of right UPJ stone with obstruction/hydronephrosis Sepsis physiology better Continue empiric IV Rocephin  Blood cultures negative so far-urine cultures remain pending.  Right UPJ stone with hydronephrosis S/p cystoscopy with stent placement Urology following  Acute on chronic hypoxic respiratory failure, HFpEF exac, Possible aspiration episode postoperatively Hypoxia worsened postoperatively-initially felt to be CHF exacerbation-as chest x-ray looked markedly worse compared to on admission.  Has been placed on IV Lasix for diuresis along with Rocephin plus Flagyl combination.  Hypoxia much improved encouraged to sit up in chair use I-S and flutter valve in the daytime.  Continue to titrate down oxygen.  Of note uses BiPAP at home for OSA.    HTN BP initially soft on 1/12-now slowly creeping up Will continue to hold all antihypertensives-allow room for diuresis.  Soft  Chronic hypoxic/hypercarbic respiratory failure OSA/OHS On 2.5-3 L of oxygen during the daytime, and trilogy ventilator nightly BIPAP qhs here while hospitalized  History of asthma Not in exacerbation Continue bronchodilators  Hypothyroidism Continue levothyroxine  Morbid obesity Lymphedema Supportive care Diuretics as able  Morbid Obesity: Estimated body mass index is 53.21 kg/m as calculated from the following:   Height as of this encounter: '5\' 4"'$  (1.626 m).   Weight as of this encounter: 140.6 kg.   Code status:   Code Status: Full Code   DVT Prophylaxis: enoxaparin (LOVENOX) injection 40 mg Start: 04/11/22 1000   Family Communication:  Spouse at bedside.   Disposition Plan: Status is: Inpatient Remains inpatient appropriate because:  Severity of illness   Planned Discharge Destination:Home  health     Diet: Diet Order             Diet Heart Room service appropriate? Yes; Fluid consistency: Thin; Fluid restriction: 1500 mL Fluid  Diet effective now                     Antimicrobial agents: Anti-infectives (From admission, onward)    Start     Dose/Rate Route Frequency Ordered Stop   04/12/22 1245  metroNIDAZOLE (FLAGYL) IVPB 500 mg        500 mg 100 mL/hr over 60 Minutes Intravenous Every 12 hours 04/12/22 1152 04/17/22 1244   04/11/22 1000  cefTRIAXone (ROCEPHIN) 2 g in sodium chloride 0.9 % 100 mL IVPB        2 g 200 mL/hr over 30 Minutes Intravenous Every 24 hours 04/11/22 0455     04/11/22 0300  cefTRIAXone (ROCEPHIN) 1 g in sodium chloride 0.9 % 100 mL IVPB        1 g 200 mL/hr over 30 Minutes Intravenous  Once 04/11/22 0249 04/11/22 0433        MEDICATIONS: Scheduled Meds:  Chlorhexidine Gluconate Cloth  6 each Topical Daily   enoxaparin (LOVENOX) injection  40 mg Subcutaneous Daily   ferrous sulfate  325 mg Oral Q breakfast   fluticasone furoate-vilanterol  1 puff Inhalation Daily   furosemide  40 mg Intravenous Q12H   ipratropium-albuterol  3 mL Nebulization TID   levothyroxine  200 mcg Oral Q0600   montelukast  10 mg Oral Daily   senna-docusate  1 tablet Oral QHS   Continuous Infusions:  cefTRIAXone (ROCEPHIN)  IV 2 g (04/12/22 0806)   metronidazole 500 mg (04/13/22 0054)   PRN Meds:.acetaminophen, HYDROmorphone (DILAUDID) injection, ipratropium-albuterol, mouth rinse, oxyCODONE, polyethylene glycol   I have personally reviewed following labs and imaging studies  LABORATORY DATA:  Recent Labs  Lab 04/10/22 2330 04/11/22 1003 04/11/22 1259 04/13/22 0505  WBC 14.4* 24.7* 24.2* 13.9*  HGB 14.4 13.0 12.8 12.0  HCT 44.7 40.8 41.4 37.9  PLT 275 235 237 245  MCV 95.1 95.1 97.0 96.9  MCH 30.6 30.3 30.0 30.7  MCHC 32.2 31.9 30.9 31.7  RDW 13.9 14.4 14.4 14.6  LYMPHSABS 0.5*  --  0.7 1.6  MONOABS 0.2  --  1.1* 1.2*  EOSABS 0.0   --  0.0 0.1  BASOSABS 0.0  --  0.1 0.1    Recent Labs  Lab 04/10/22 2330 04/11/22 1003 04/12/22 0156 04/13/22 0505  NA 131* 134* 137 136  K 4.0 4.4 4.4 3.8  CL 95* 97* 94* 94*  CO2 '23 25 29 31  '$ ANIONGAP '13 12 14 11  '$ GLUCOSE 116* 155* 153* 137*  BUN '16 14 17 19  '$ CREATININE 0.94 0.99 0.75 0.68  AST 49*  --   --   --   ALT 31  --   --   --   ALKPHOS 102  --   --   --   BILITOT 1.3*  --   --   --   ALBUMIN 4.2  --   --   --   CRP  --   --   --  19.6*  PROCALCITON  --   --  16.79 6.93  BNP  --   --  199.3* 74.4  MG  --  1.8  --  2.0  CALCIUM 9.3 8.8* 8.9 8.9  Recent Labs  Lab 04/10/22 2330 04/11/22 1003 04/12/22 0156 04/13/22 0505  CRP  --   --   --  19.6*  PROCALCITON  --   --  16.79 6.93  BNP  --   --  199.3* 74.4  MG  --  1.8  --  2.0  CALCIUM 9.3 8.8* 8.9 8.9    RADIOLOGY STUDIES/RESULTS: DG Chest Port 1 View  Result Date: 04/13/2022 CLINICAL DATA:  Shortness of breath EXAM: PORTABLE CHEST 1 VIEW COMPARISON:  Chest x-rays dated 04/12/2022 and 04/11/2022. FINDINGS: Cardiomediastinal contours are mostly obscured, but grossly stable in size. There are diffuse bilateral airspace opacities, RIGHT greater than LEFT, perhaps slightly increased on the RIGHT compared to yesterday's chest x-ray. No pleural effusion or pneumothorax is seen. IMPRESSION: 1. Diffuse bilateral airspace opacities, RIGHT greater than LEFT, perhaps slightly increased on the RIGHT compared to yesterday's chest x-ray, compatible with multifocal pneumonia versus pulmonary edema. 2. Uncertain heart size.  Probable cardiomegaly. Electronically Signed   By: Franki Cabot M.D.   On: 04/13/2022 08:08   DG Chest Port 1 View  Result Date: 04/12/2022 CLINICAL DATA:  Shortness of breath EXAM: PORTABLE CHEST 1 VIEW COMPARISON:  04/11/2022 FINDINGS: Patient is rotated. Stable cardiomediastinal contours. Low lung volumes. Extensive patchy bilateral airspace opacities, not appreciably changed. Probable small  right pleural effusion. No evidence of a pneumothorax. IMPRESSION: Extensive patchy bilateral airspace opacities, not appreciably changed. Probable small right pleural effusion. Electronically Signed   By: Davina Poke D.O.   On: 04/12/2022 09:57   DG Chest Port 1V same Day  Result Date: 04/11/2022 CLINICAL DATA:  Shortness of breath EXAM: PORTABLE CHEST 1 VIEW COMPARISON:  Chest radiograph dated 04/10/2022 FINDINGS: Patient is similarly rotated to the right. Low lung volumes. Markedly increased bilateral airspace opacities. No definite pleural effusion or pneumothorax. Cardiac borders are obscured. The visualized skeletal structures are unremarkable. IMPRESSION: Markedly increased bilateral airspace opacities, which may represent pulmonary edema or infection. Electronically Signed   By: Darrin Nipper M.D.   On: 04/11/2022 12:21     LOS: 2 days   Signature  -    Lala Lund M.D on 04/13/2022 at 8:40 AM   -  To page go to [email or web address]

## 2022-04-13 NOTE — Progress Notes (Signed)
Placed patient on bipap for the night with oxygen set at 8lpm

## 2022-04-14 ENCOUNTER — Other Ambulatory Visit: Payer: Self-pay | Admitting: Urology

## 2022-04-14 DIAGNOSIS — N201 Calculus of ureter: Secondary | ICD-10-CM | POA: Diagnosis not present

## 2022-04-14 LAB — CBC WITH DIFFERENTIAL/PLATELET
Abs Immature Granulocytes: 0.06 10*3/uL (ref 0.00–0.07)
Basophils Absolute: 0.1 10*3/uL (ref 0.0–0.1)
Basophils Relative: 1 %
Eosinophils Absolute: 0.2 10*3/uL (ref 0.0–0.5)
Eosinophils Relative: 2 %
HCT: 38.2 % (ref 36.0–46.0)
Hemoglobin: 11.8 g/dL — ABNORMAL LOW (ref 12.0–15.0)
Immature Granulocytes: 1 %
Lymphocytes Relative: 13 %
Lymphs Abs: 1.3 10*3/uL (ref 0.7–4.0)
MCH: 30.1 pg (ref 26.0–34.0)
MCHC: 30.9 g/dL (ref 30.0–36.0)
MCV: 97.4 fL (ref 80.0–100.0)
Monocytes Absolute: 1.3 10*3/uL — ABNORMAL HIGH (ref 0.1–1.0)
Monocytes Relative: 13 %
Neutro Abs: 7 10*3/uL (ref 1.7–7.7)
Neutrophils Relative %: 70 %
Platelets: 240 10*3/uL (ref 150–400)
RBC: 3.92 MIL/uL (ref 3.87–5.11)
RDW: 14.4 % (ref 11.5–15.5)
WBC: 9.8 10*3/uL (ref 4.0–10.5)
nRBC: 0 % (ref 0.0–0.2)

## 2022-04-14 LAB — BRAIN NATRIURETIC PEPTIDE: B Natriuretic Peptide: 48.7 pg/mL (ref 0.0–100.0)

## 2022-04-14 LAB — C-REACTIVE PROTEIN: CRP: 15.3 mg/dL — ABNORMAL HIGH (ref <1.0)

## 2022-04-14 LAB — BASIC METABOLIC PANEL
Anion gap: 9 (ref 5–15)
BUN: 16 mg/dL (ref 8–23)
CO2: 35 mmol/L — ABNORMAL HIGH (ref 22–32)
Calcium: 9 mg/dL (ref 8.9–10.3)
Chloride: 93 mmol/L — ABNORMAL LOW (ref 98–111)
Creatinine, Ser: 0.53 mg/dL (ref 0.44–1.00)
GFR, Estimated: >60 mL/min (ref >60)
Glucose, Bld: 144 mg/dL — ABNORMAL HIGH (ref 70–99)
Potassium: 3.7 mmol/L (ref 3.5–5.1)
Sodium: 137 mmol/L (ref 135–145)

## 2022-04-14 LAB — PROCALCITONIN: Procalcitonin: 3.44 ng/mL

## 2022-04-14 LAB — MAGNESIUM: Magnesium: 2 mg/dL (ref 1.7–2.4)

## 2022-04-14 LAB — TSH: TSH: 3.18 u[IU]/mL (ref 0.350–4.500)

## 2022-04-14 MED ORDER — METOPROLOL TARTRATE 50 MG PO TABS
50.0000 mg | ORAL_TABLET | Freq: Two times a day (BID) | ORAL | Status: DC
  Administered 2022-04-14 – 2022-04-17 (×7): 50 mg via ORAL
  Filled 2022-04-14 (×7): qty 1

## 2022-04-14 MED ORDER — ENOXAPARIN SODIUM 100 MG/ML IJ SOSY
85.0000 mg | PREFILLED_SYRINGE | Freq: Every day | INTRAMUSCULAR | Status: DC
  Administered 2022-04-14 – 2022-04-17 (×4): 85 mg via SUBCUTANEOUS
  Filled 2022-04-14 (×5): qty 1

## 2022-04-14 NOTE — Care Management Important Message (Signed)
Important Message  Patient Details  Name: Tricia Perry MRN: [id number] Date of Birth: [birth year]   Medicare Important Message Given:  Yes     Adetokunbo Mccadden [address] 04/14/2022, 3:32 PM

## 2022-04-14 NOTE — Progress Notes (Signed)
Occupational Therapy Treatment Patient Details Name: Tricia Perry MRN: [id number] DOB: May 21, 1955 Today's Date: 04/14/2022   History of present illness Patient is a 67 y.o.  female admitted 1/11 presented with right flank pain-was found to have right UPJ junction stone with obstruction causing complicated UTI with sepsis physiology.  undersent cystoscopy with stent.  PMH:  chronic hypoxic/hypercarbic respiratory failure on around 3 L of oxygen at home-trilogy machine at night, chronic HFpEF, morbidly obese   OT comments  Pt making slow but steady progress toward all adl goals. Spoke with pt and husband about pt doing more for herself at home. Pt depends on husband for things she is able to do although they wear her out.  Pt with O2 sats down to 86% on 6L O2 and HR to 130 with simple standing tasks in the chair. Will continue to follow and encourage pt to do more for herself.   Recommendations for follow up therapy are one component of a multi-disciplinary discharge planning process, led by the attending physician.  Recommendations may be updated based on patient status, additional functional criteria and insurance authorization.    Follow Up Recommendations  Home health OT     Assistance Recommended at Discharge Frequent or constant Supervision/Assistance  Patient can return home with the following  A lot of help with bathing/dressing/bathroom;A lot of help with walking and/or transfers;Assist for transportation;Assistance with cooking/housework   Equipment Recommendations  None recommended by OT    Recommendations for Other Services      Precautions / Restrictions Precautions Precautions: Fall Precaution Comments: watch HR, 3L O2 (on home O2) Restrictions Weight Bearing Restrictions: No       Mobility Bed Mobility               General bed mobility comments: pt in recliner upon arrival    Transfers Overall transfer level: Needs assistance Equipment used: Rolling  walker (2 wheels) Transfers: Sit to/from Stand, Bed to chair/wheelchair/BSC Sit to Stand: Min assist Stand pivot transfers: Min assist         General transfer comment: Pt pulls on one person with L hand in front of her while pushing from chair with R hand.  Pt stood x3 for appx 1 minute.  HR up to 130 and O2 sats down to 86% on 6L of O2.  Spoke with husband and pt about doing these sit to stand transitions once every 1-2 hours while in the chair to build up standing tolerance.     Balance Overall balance assessment: Needs assistance Sitting-balance support: Feet supported Sitting balance-Leahy Scale: Good     Standing balance support: Bilateral upper extremity supported Standing balance-Leahy Scale: Poor Standing balance comment: heavily reliant on walker. pt puts elbows on walker while shifting weight L to R because this is more comfortable for her.                           ADL either performed or assessed with clinical judgement   ADL Overall ADL's : Needs assistance/impaired         Upper Body Bathing: Sitting;Minimal assistance Upper Body Bathing Details (indicate cue type and reason): Pt is very accustomed to letting husband do a lot for her.  Pt can bathe UE with min assist (assist to reach areas w a lot of folds) if instructed to do so. Pt asked husband to assist but could do this on own. HR up to 128 sitting.  Upper Body Dressing : Minimal assistance;Sitting Upper Body Dressing Details (indicate cue type and reason): educated pt to wear loose clothing due to BUE shoulder limitations so pt can get own shirt over head.     Toilet Transfer: Minimal assistance;Rolling walker (2 wheels);Stand-pivot Armed forces technical officer Details (indicate cue type and reason): stand pivot simulated to chair. Pt has spent a lot of time in chair over last few days so knees are stiff making trannsfers painful. Toileting- Clothing Manipulation and Hygiene: Total assistance Toileting -  Clothing Manipulation Details (indicate cue type and reason): husband assists.     Functional mobility during ADLs: Moderate assistance;Rolling walker (2 wheels) General ADL Comments: Pt requires a great amount of encouragement to do for herself. Pt would benefit from reacher and sock aid and was introduced to them and can use them Ily but would rather have husband assist with socks and shoes (husband with back brace on)Recommended pt use sock aid to donn socks.    Extremity/Trunk Assessment Upper Extremity Assessment Upper Extremity Assessment: Generalized weakness RUE Deficits / Details: shoulder flexion limited 0-80 degrees LUE Deficits / Details: shoulder flexion limited 0-80 degrees   Lower Extremity Assessment Lower Extremity Assessment: Defer to PT evaluation        Vision   Vision Assessment?: No apparent visual deficits   Perception Perception Perception: Within Functional Limits   Praxis Praxis Praxis: Intact    Cognition Arousal/Alertness: Awake/alert Behavior During Therapy: WFL for tasks assessed/performed Overall Cognitive Status: Within Functional Limits for tasks assessed                                          Exercises      Shoulder Instructions       General Comments Pt most limited by HR, pain in standing and decreased O2 sats.  Spoke with pt and husband about letting pt do more for herself at home.    Pertinent Vitals/ Pain       Pain Assessment Pain Assessment: Faces Faces Pain Scale: Hurts even more Pain Location: back and knees with transfers Pain Descriptors / Indicators: Aching Pain Intervention(s): Monitored during session, Repositioned  Home Living                                          Prior Functioning/Environment              Frequency  Min 2X/week        Progress Toward Goals  OT Goals(current goals can now be found in the care plan section)  Progress towards OT goals:  Progressing toward goals  Acute Rehab OT Goals Patient Stated Goal: to get back home OT Goal Formulation: With patient/family Time For Goal Achievement: 04/26/22 Potential to Achieve Goals: Good ADL Goals Pt Will Perform Grooming: with set-up;with modified independence;sitting Pt Will Perform Upper Body Bathing: with min assist;with caregiver independent in assisting;sitting Pt Will Perform Upper Body Dressing: with min assist;with min guard assist;sitting Pt Will Transfer to Toilet: with mod assist;stand pivot transfer;bedside commode  Plan Discharge plan remains appropriate    Co-evaluation                 AM-PAC OT "6 Clicks" Daily Activity     Outcome Measure   Help from another person eating meals?: None  Help from another person taking care of personal grooming?: None Help from another person toileting, which includes using toliet, bedpan, or urinal?: Total Help from another person bathing (including washing, rinsing, drying)?: A Lot Help from another person to put on and taking off regular upper body clothing?: A Little Help from another person to put on and taking off regular lower body clothing?: Total 6 Click Score: 15    End of Session Equipment Utilized During Treatment: Oxygen;Rolling walker (2 wheels)  OT Visit Diagnosis: Unsteadiness on feet (R26.81);Other abnormalities of gait and mobility (R26.89);Muscle weakness (generalized) (M62.81);Pain Pain - part of body: Knee (back)   Activity Tolerance Patient limited by fatigue   Patient Left in chair;with family/visitor present   Nurse Communication Mobility status        Time: 1655-3748 OT Time Calculation (min): 24 min  Charges: OT General Charges $OT Visit: 1 Visit OT Treatments $Self Care/Home Management : 23-37 mins    Glenford Peers 04/14/2022, 11:56 AM

## 2022-04-14 NOTE — Progress Notes (Signed)
PROGRESS NOTE        PATIENT DETAILS Name: Tricia Perry Age: 67 y.o. Sex: female Date of Birth: 1955-06-10 Admit Date: 04/10/2022 Admitting Physician Evalee Mutton Kristeen Mans, MD [id number], Truddie Crumble, PA-C  Brief Summary: Patient is a 67 y.o.  female with history of chronic hypoxic/hypercarbic respiratory failure on around 3 L of oxygen at home-trilogy machine at night, chronic HFpEF, morbidly obese who presented with right flank pain-was found to have right UPJ junction stone with obstruction causing complicated UTI with sepsis physiology.  Significant events: 1/11>> admit to [facility]-right UPJ stone with hydronephrosis-sepsis/complicated UTI 9/48>> cystoscopy/right JJ stent placement.  Postoperatively worsening hypoxemia-briefly required BiPAP-given Lasix-transitioned to HFNC-8-10 L.  CXR with significant infiltrates. 1/13>> down to 5 L of HFNC  Significant studies: 1/11>> CXR: Mild bibasilar atelectasis/infiltrate 1/12>> CT renal stone study: Obstructing 1.0 cm stone right UPJ junction with moderate right hydronephrosis. 1/12>> CXR: Markedly increased bilateral airspace opacities 1/13>> CXR: Extensive patchy bilateral airspace disease.  Significant microbiology data: 1/12>> urine culture: Pending 1/12>> blood culture: neg  Procedures: 1/12>>Cystoscopy, right retrograde pyelogram with intraoperative interpretation, insertion right JJ stent   Consults: Urology  Subjective:  Patient in bed, appears comfortable, denies any headache, no fever, no chest pain or pressure, no shortness of breath , no abdominal pain. No new focal weakness.  Active: Vitals: Blood pressure 136/77, pulse (!) 113, temperature 98.7 F (37.1 C), temperature source Oral, resp. rate (!) 25, height '5\' 4"'$  (1.626 m), weight (!) 174.5 kg, SpO2 91 %.   Exam:  Awake Alert, No new F.N deficits, foley [place].AT,PERRAL Supple Neck, No JVD,   Symmetrical Chest wall movement, stent breath  sounds due to body habitus, RRR,No Gallops, Rubs or new Murmurs,  +ve B.Sounds, Abd Soft, No tenderness,   trace edema     Assessment/Plan:  Sepsis due to complicated UTI in the setting of right UPJ stone with obstruction/hydronephrosis Sepsis physiology better Continue empiric IV Rocephin  Blood cultures negative so far-urine cultures remain pending.  Right UPJ stone with hydronephrosis S/p cystoscopy with stent placement Urology following  Acute on chronic hypoxic respiratory failure, HFpEF exac, Possible aspiration episode postoperatively Hypoxia worsened postoperatively-initially felt to be CHF exacerbation-as chest x-ray looked markedly worse compared to on admission.  Has been placed on IV Lasix for diuresis along with Rocephin plus Flagyl combination.  Hypoxia much improved encouraged to sit up in chair use I-S and flutter valve in the daytime.  Continue to titrate down oxygen.  Of note uses BiPAP at home for OSA.    HTN BP proving will add Lopressor and monitor.  Chronic hypoxic/hypercarbic respiratory failure OSA/OHS On 2.5-3 L of oxygen during the daytime, and trilogy ventilator nightly BIPAP qhs here while hospitalized  History of asthma Not in exacerbation Continue bronchodilators  Hypothyroidism Continue levothyroxine  Morbid obesity Lymphedema Supportive care Diuretics as able  Morbid Obesity: Estimated body mass index is 66.03 kg/m as calculated from the following:   Height as of this encounter: '5\' 4"'$  (1.626 m).   Weight as of this encounter: 174.5 kg.   Code status:   Code Status: Full Code   DVT Prophylaxis:    Family Communication:  Spouse at bedside 04/13/22.   Disposition Plan: Status is: Inpatient Remains inpatient appropriate because: Severity of illness   Planned Discharge Destination:Home health     Diet: Diet Order  Diet Heart Room service appropriate? Yes; Fluid consistency: Thin; Fluid restriction: 1500 mL  Fluid  Diet effective now                     Antimicrobial agents: Anti-infectives (From admission, onward)    Start     Dose/Rate Route Frequency Ordered Stop   04/12/22 1245  metroNIDAZOLE (FLAGYL) IVPB 500 mg        500 mg 100 mL/hr over 60 Minutes Intravenous Every 12 hours 04/12/22 1152 04/17/22 1244   04/11/22 1000  cefTRIAXone (ROCEPHIN) 2 g in sodium chloride 0.9 % 100 mL IVPB        2 g 200 mL/hr over 30 Minutes Intravenous Every 24 hours 04/11/22 0455     04/11/22 0300  cefTRIAXone (ROCEPHIN) 1 g in sodium chloride 0.9 % 100 mL IVPB        1 g 200 mL/hr over 30 Minutes Intravenous  Once 04/11/22 0249 04/11/22 0433        MEDICATIONS: Scheduled Meds:  Chlorhexidine Gluconate Cloth  6 each Topical Daily   enoxaparin (LOVENOX) injection  85 mg Subcutaneous Daily   ferrous sulfate  325 mg Oral Q breakfast   fluticasone furoate-vilanterol  1 puff Inhalation Daily   ipratropium-albuterol  3 mL Nebulization TID   levothyroxine  200 mcg Oral Q0600   metoprolol tartrate  50 mg Oral BID   montelukast  10 mg Oral Daily   senna-docusate  1 tablet Oral QHS   Continuous Infusions:  cefTRIAXone (ROCEPHIN)  IV 2 g (04/14/22 0835)   metronidazole 500 mg (04/14/22 0035)   PRN Meds:.acetaminophen, ipratropium-albuterol, mouth rinse, oxyCODONE, polyethylene glycol   I have personally reviewed following labs and imaging studies  LABORATORY DATA:  Recent Labs  Lab 04/10/22 2330 04/11/22 1003 04/11/22 1259 04/13/22 0505 04/14/22 0424  WBC 14.4* 24.7* 24.2* 13.9* 9.8  HGB 14.4 13.0 12.8 12.0 11.8*  HCT 44.7 40.8 41.4 37.9 38.2  PLT 275 235 237 245 240  MCV 95.1 95.1 97.0 96.9 97.4  MCH 30.6 30.3 30.0 30.7 30.1  MCHC 32.2 31.9 30.9 31.7 30.9  RDW 13.9 14.4 14.4 14.6 14.4  LYMPHSABS 0.5*  --  0.7 1.6 1.3  MONOABS 0.2  --  1.1* 1.2* 1.3*  EOSABS 0.0  --  0.0 0.1 0.2  BASOSABS 0.0  --  0.1 0.1 0.1    Recent Labs  Lab 04/10/22 2330 04/11/22 1003 04/12/22 0156  04/13/22 0505 04/14/22 0424  NA 131* 134* 137 136 137  K 4.0 4.4 4.4 3.8 3.7  CL 95* 97* 94* 94* 93*  CO2 '23 25 29 31 '$ 35*  ANIONGAP '13 12 14 11 9  '$ GLUCOSE 116* 155* 153* 137* 144*  BUN '16 14 17 19 16  '$ CREATININE 0.94 0.99 0.75 0.68 0.53  AST 49*  --   --   --   --   ALT 31  --   --   --   --   ALKPHOS 102  --   --   --   --   BILITOT 1.3*  --   --   --   --   ALBUMIN 4.2  --   --   --   --   CRP  --   --   --  19.6* 15.3*  PROCALCITON  --   --  16.79 6.93 3.44  BNP  --   --  199.3* 74.4 48.7  MG  --  1.8  --  2.0 2.0  CALCIUM 9.3 8.8* 8.9 8.9 9.0      Recent Labs  Lab 04/10/22 2330 04/11/22 1003 04/12/22 0156 04/13/22 0505 04/14/22 0424  CRP  --   --   --  19.6* 15.3*  PROCALCITON  --   --  16.79 6.93 3.44  BNP  --   --  199.3* 74.4 48.7  MG  --  1.8  --  2.0 2.0  CALCIUM 9.3 8.8* 8.9 8.9 9.0    RADIOLOGY STUDIES/RESULTS: DG Chest Port 1 View  Result Date: 04/13/2022 CLINICAL DATA:  Shortness of breath EXAM: PORTABLE CHEST 1 VIEW COMPARISON:  Chest x-rays dated 04/12/2022 and 04/11/2022. FINDINGS: Cardiomediastinal contours are mostly obscured, but grossly stable in size. There are diffuse bilateral airspace opacities, RIGHT greater than LEFT, perhaps slightly increased on the RIGHT compared to yesterday's chest x-ray. No pleural effusion or pneumothorax is seen. IMPRESSION: 1. Diffuse bilateral airspace opacities, RIGHT greater than LEFT, perhaps slightly increased on the RIGHT compared to yesterday's chest x-ray, compatible with multifocal pneumonia versus pulmonary edema. 2. Uncertain heart size.  Probable cardiomegaly. Electronically Signed   By: Franki Cabot M.D.   On: 04/13/2022 08:08     LOS: 3 days   Signature  -    Lala Lund M.D on 04/14/2022 at 9:42 AM   -  To page go to [email or web address]

## 2022-04-14 NOTE — Progress Notes (Signed)
Patient requested to take bipap off.  She complaint that it dries out her mouth and that she is uncomfortable in the bed and would like to sit in the chair.  Bipap is taken off and placed on high flow nasal canula.  Patient is assisted to chair.  Bedside table and call light is within reach.  Will continue to monitor.

## 2022-04-14 NOTE — Progress Notes (Signed)
Assisted patient back to bed.  C/o pain 10/10; PRN med given.  Will continue to monitor.

## 2022-04-15 DIAGNOSIS — N201 Calculus of ureter: Secondary | ICD-10-CM | POA: Diagnosis not present

## 2022-04-15 LAB — BASIC METABOLIC PANEL
Anion gap: 9 (ref 5–15)
BUN: 12 mg/dL (ref 8–23)
CO2: 36 mmol/L — ABNORMAL HIGH (ref 22–32)
Calcium: 9 mg/dL (ref 8.9–10.3)
Chloride: 93 mmol/L — ABNORMAL LOW (ref 98–111)
Creatinine, Ser: 0.57 mg/dL (ref 0.44–1.00)
GFR, Estimated: >60 mL/min (ref >60)
Glucose, Bld: 134 mg/dL — ABNORMAL HIGH (ref 70–99)
Potassium: 3.8 mmol/L (ref 3.5–5.1)
Sodium: 138 mmol/L (ref 135–145)

## 2022-04-15 LAB — CBC WITH DIFFERENTIAL/PLATELET
Abs Immature Granulocytes: 0.1 10*3/uL — ABNORMAL HIGH (ref 0.00–0.07)
Basophils Absolute: 0.1 10*3/uL (ref 0.0–0.1)
Basophils Relative: 1 %
Eosinophils Absolute: 0.3 10*3/uL (ref 0.0–0.5)
Eosinophils Relative: 4 %
HCT: 38 % (ref 36.0–46.0)
Hemoglobin: 11.7 g/dL — ABNORMAL LOW (ref 12.0–15.0)
Immature Granulocytes: 1 %
Lymphocytes Relative: 18 %
Lymphs Abs: 1.4 10*3/uL (ref 0.7–4.0)
MCH: 30.1 pg (ref 26.0–34.0)
MCHC: 30.8 g/dL (ref 30.0–36.0)
MCV: 97.7 fL (ref 80.0–100.0)
Monocytes Absolute: 0.9 10*3/uL (ref 0.1–1.0)
Monocytes Relative: 12 %
Neutro Abs: 4.9 10*3/uL (ref 1.7–7.7)
Neutrophils Relative %: 64 %
Platelets: 257 10*3/uL (ref 150–400)
RBC: 3.89 MIL/uL (ref 3.87–5.11)
RDW: 14.1 % (ref 11.5–15.5)
WBC: 7.7 10*3/uL (ref 4.0–10.5)
nRBC: 0 % (ref 0.0–0.2)

## 2022-04-15 LAB — BRAIN NATRIURETIC PEPTIDE: B Natriuretic Peptide: 59.2 pg/mL (ref 0.0–100.0)

## 2022-04-15 LAB — C-REACTIVE PROTEIN: CRP: 10.9 mg/dL — ABNORMAL HIGH (ref <1.0)

## 2022-04-15 LAB — MAGNESIUM: Magnesium: 2.1 mg/dL (ref 1.7–2.4)

## 2022-04-15 LAB — PROCALCITONIN: Procalcitonin: 1.63 ng/mL

## 2022-04-15 MED ORDER — HYOSCYAMINE SULFATE 0.125 MG SL SUBL
0.2500 mg | SUBLINGUAL_TABLET | Freq: Four times a day (QID) | SUBLINGUAL | Status: DC | PRN
  Administered 2022-04-15 – 2022-04-16 (×3): 0.25 mg via SUBLINGUAL
  Filled 2022-04-15 (×5): qty 2

## 2022-04-15 MED ORDER — FUROSEMIDE 10 MG/ML IJ SOLN
60.0000 mg | Freq: Two times a day (BID) | INTRAMUSCULAR | Status: AC
  Administered 2022-04-15 (×2): 60 mg via INTRAVENOUS
  Filled 2022-04-15 (×2): qty 6

## 2022-04-15 MED ORDER — IPRATROPIUM-ALBUTEROL 0.5-2.5 (3) MG/3ML IN SOLN
3.0000 mL | Freq: Two times a day (BID) | RESPIRATORY_TRACT | Status: DC
  Administered 2022-04-15 – 2022-04-17 (×4): 3 mL via RESPIRATORY_TRACT
  Filled 2022-04-15 (×4): qty 3

## 2022-04-15 MED ORDER — SPIRONOLACTONE 25 MG PO TABS
25.0000 mg | ORAL_TABLET | Freq: Once | ORAL | Status: AC
  Administered 2022-04-15: 25 mg via ORAL
  Filled 2022-04-15: qty 1

## 2022-04-15 MED ORDER — POTASSIUM CHLORIDE CRYS ER 20 MEQ PO TBCR
40.0000 meq | EXTENDED_RELEASE_TABLET | Freq: Once | ORAL | Status: AC
  Administered 2022-04-15: 40 meq via ORAL
  Filled 2022-04-15: qty 2

## 2022-04-15 NOTE — Progress Notes (Signed)
Pt resting comfortably on CPAP.

## 2022-04-15 NOTE — TOC Initial Note (Signed)
Transition of Care [facility]) - Initial/Assessment Note    Patient Details  Name: Tricia Perry MRN: [id number] Date of Birth: Aug 08, 1955  Transition of [facility]) CM/SW Contact:    Carles Collet, RN Phone Number: 04/15/2022, 11:30 AM  Clinical Narrative:                  Spoke to patient over the phone to discuss discharge planning.  She would like to have [facility] services re-established with [facility].  [facility] notified and accepted the referral. She denied any additional DME needs, has RW and oxygen at home. Oxygen through [facility] Expected Discharge Plan: [place] Barriers to Discharge: Continued Medical Work up   Patient Goals and CMS Choice Patient states their goals for this hospitalization and ongoing recovery are:: to go home CMS Medicare.gov Compare Post Acute Care list provided to:: Patient Choice offered to / list presented to : Patient      Expected Discharge Plan and Services   Discharge Planning Services: CM Consult Post Acute Care Choice: [place] arrangements for the past 2 months: Single Family Home                 DME Arranged: N/A         HH Arranged: PT, OT HH Agency: [facility] Date [place] Agency Contacted: 04/15/22 Time [place]: 12 Representative spoke with at [place]: [place] Arrangements/Services Living arrangements for the past 2 months: [place] with:: Spouse   Do you feel safe going back to the place where you live?: Yes          Current home services: DME    Activities of Daily Living Home Assistive Devices/Equipment: Bedside commode/3-in-1, Shower chair without back, Environmental consultant (specify type), Eyeglasses ADL Screening (condition at time of admission) Patient's cognitive ability adequate to safely complete daily activities?: Yes Is the patient deaf or have difficulty hearing?: No Does the patient have difficulty seeing, even when wearing glasses/contacts?: No Does the  patient have difficulty concentrating, remembering, or making decisions?: No Patient able to express need for assistance with ADLs?: No Does the patient have difficulty dressing or bathing?: Yes Independently performs ADLs?: No Communication: Independent Dressing (OT): Needs assistance Is this a change from baseline?: Pre-admission baseline Grooming: Needs assistance Is this a change from baseline?: Pre-admission baseline Feeding: Independent Bathing: Needs assistance Is this a change from baseline?: Pre-admission baseline Toileting: Independent In/Out Bed: Independent Walks in Home: Independent with device (comment) Does the patient have difficulty walking or climbing stairs?: Yes Weakness of Legs: Both Weakness of Arms/Hands: None  Permission Sought/Granted                  Emotional Assessment              Admission diagnosis:  Acute UTI [N39.0] Obstruction of left ureteropelvic junction (UPJ) due to stone [N20.1] Obstruction of right ureteropelvic junction (UPJ) due to stone [N20.1] Acute hypoxemic respiratory failure ([place]) [J96.01] Patient Active Problem List   Diagnosis Date Noted   Obstruction of right ureteropelvic junction (UPJ) due to stone 04/11/2022   Chronic respiratory failure with hypercapnia ([place]) 09/24/2021   History of total knee arthroplasty, right 10/24/2020   Chronic diastolic heart failure ([place]) 51/88/4166   Diastolic dysfunction 09/28/1599   Acute on chronic respiratory failure ([place]) 04/30/2020   Morbid obesity ([place]) 04/30/2020   Acute on chronic respiratory failure with hypoxia and hypercapnia ([place]) 04/30/2020   Acute  hypoxemic respiratory failure ([place]) 04/24/2020   Hypoxia 04/21/2020   Maxillary sinusitis 02/19/2015   OSA (obstructive sleep apnea) 12/13/2013   Hypothyroidism 12/04/2013   Varicose veins of lower extremities with other complications 59/16/3846   Insomnia 06/13/2013   Pulmonary embolism ([place]) 05/29/2013   Shortness of  breath 04/20/2013   Insulin resistance 11/29/2012   HTN (hypertension) 11/29/2012   Vitamin B12 deficiency 11/29/2012   Chronic pain 07/11/2010   Chronic venous insufficiency 06/21/2010   B12 DEFICIENCY 05/10/2010   IRRITABLE BOWEL SYNDROME 05/10/2010   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

## 2022-04-15 NOTE — Progress Notes (Signed)
PROGRESS NOTE        PATIENT DETAILS Name: Tricia Perry Age: 67 y.o. Sex: female Date of Birth: 1956-01-14 Admit Date: 04/10/2022 Admitting Physician Evalee Mutton Kristeen Mans, MD JAS:[id number], Truddie Crumble, PA-C  Brief Summary: Patient is a 67 y.o.  female with history of chronic hypoxic/hypercarbic respiratory failure on around 3 L of oxygen at home-trilogy machine at night, chronic HFpEF, morbidly obese who presented with right flank pain-was found to have right UPJ junction stone with obstruction causing complicated UTI with sepsis physiology.  Significant events: 1/11>> admit to [facility]-right UPJ stone with hydronephrosis-sepsis/complicated UTI 7/34>> cystoscopy/right JJ stent placement.  Postoperatively worsening hypoxemia-briefly required BiPAP-given Lasix-transitioned to HFNC-8-10 L.  CXR with significant infiltrates. 1/13>> down to 5 L of HFNC  Significant studies: 1/11>> CXR: Mild bibasilar atelectasis/infiltrate 1/12>> CT renal stone study: Obstructing 1.0 cm stone right UPJ junction with moderate right hydronephrosis. 1/12>> CXR: Markedly increased bilateral airspace opacities 1/13>> CXR: Extensive patchy bilateral airspace disease.  Significant microbiology data: 1/12>> urine culture: Pending 1/12>> blood culture: neg  Procedures: 1/12>>Cystoscopy, right retrograde pyelogram with intraoperative interpretation, insertion right JJ stent   Consults: Urology  Subjective:  Patient in bed, appears comfortable, denies any headache, no fever, no chest pain or pressure, improving shortness of breath , no abdominal pain. No new focal weakness.   Active: Vitals: Blood pressure 138/72, pulse 84, temperature 97.9 F (36.6 C), temperature source Oral, resp. rate 20, height '5\' 4"'$  (1.626 m), weight (!) 176.6 kg, SpO2 97 %.   Exam:  Awake Alert, No new F.N deficits, foley [place].AT,PERRAL Supple Neck, No JVD,   Symmetrical Chest wall movement, Good air movement  bilaterally, CTAB RRR,No Gallops, Rubs or new Murmurs,  +ve B.Sounds, Abd Soft, No tenderness,   1+ edema      Assessment/Plan:  Sepsis due to complicated UTI in the setting of right UPJ stone with obstruction/hydronephrosis Sepsis physiology better Continue empiric IV Rocephin  Blood cultures negative so far-urine cultures remain pending.  Right UPJ stone with hydronephrosis S/p cystoscopy with stent placement Urology following  Acute on chronic hypoxic respiratory failure, HFpEF exac, Possible aspiration episode postoperatively Hypoxia worsened postoperatively-initially felt to be CHF exacerbation-as chest x-ray looked markedly worse compared to on admission.  Has been placed on IV Lasix for diuresis, dose increased on 04/15/2022 as still has edema and has fluid overload, for now continue with Rocephin plus Flagyl combination.  Hypoxia much improved encouraged to sit up in chair use I-S and flutter valve in the daytime.  Continue to titrate down oxygen.  Of note uses BiPAP at home for OSA.    HTN - BP proving will add Lopressor and monitor.  Chronic hypoxic/hypercarbic respiratory failure OSA/OHS On 2.5-3 L of oxygen during the daytime, and trilogy ventilator nightly BIPAP qhs here while hospitalized  History of asthma Not in exacerbation Continue bronchodilators  Hypothyroidism Continue levothyroxine  Morbid obesity Lymphedema Supportive care Diuretics as able  Morbid Obesity: Estimated body mass index is 66.83 kg/m as calculated from the following:   Height as of this encounter: '5\' 4"'$  (1.626 m).   Weight as of this encounter: 176.6 kg.   Code status:   Code Status: Full Code   DVT Prophylaxis:    Family Communication:  Spouse at bedside 04/13/22.   Disposition Plan: Status is: Inpatient Remains inpatient appropriate because: Severity of illness  Planned Discharge Destination:Home health   Diet: Diet Order             Diet Heart Room service  appropriate? Yes; Fluid consistency: Thin; Fluid restriction: 1500 mL Fluid  Diet effective now                     MEDICATIONS: Scheduled Meds:  Chlorhexidine Gluconate Cloth  6 each Topical Daily   enoxaparin (LOVENOX) injection  85 mg Subcutaneous Daily   ferrous sulfate  325 mg Oral Q breakfast   fluticasone furoate-vilanterol  1 puff Inhalation Daily   furosemide  60 mg Intravenous Q12H   ipratropium-albuterol  3 mL Nebulization TID   levothyroxine  200 mcg Oral Q0600   metoprolol tartrate  50 mg Oral BID   montelukast  10 mg Oral Daily   potassium chloride  40 mEq Oral Once   senna-docusate  1 tablet Oral QHS   spironolactone  25 mg Oral Once   Continuous Infusions:  cefTRIAXone (ROCEPHIN)  IV 2 g (04/14/22 0835)   metronidazole 500 mg (04/14/22 2347)   PRN Meds:.acetaminophen, ipratropium-albuterol, mouth rinse, oxyCODONE, polyethylene glycol   I have personally reviewed following labs and imaging studies  LABORATORY DATA:  Recent Labs  Lab 04/10/22 2330 04/11/22 1003 04/11/22 1259 04/13/22 0505 04/14/22 0424 04/15/22 0554  WBC 14.4* 24.7* 24.2* 13.9* 9.8 7.7  HGB 14.4 13.0 12.8 12.0 11.8* 11.7*  HCT 44.7 40.8 41.4 37.9 38.2 38.0  PLT 275 235 237 245 240 257  MCV 95.1 95.1 97.0 96.9 97.4 97.7  MCH 30.6 30.3 30.0 30.7 30.1 30.1  MCHC 32.2 31.9 30.9 31.7 30.9 30.8  RDW 13.9 14.4 14.4 14.6 14.4 14.1  LYMPHSABS 0.5*  --  0.7 1.6 1.3 1.4  MONOABS 0.2  --  1.1* 1.2* 1.3* 0.9  EOSABS 0.0  --  0.0 0.1 0.2 0.3  BASOSABS 0.0  --  0.1 0.1 0.1 0.1    Recent Labs  Lab 04/10/22 2330 04/11/22 1003 04/12/22 0156 04/13/22 0505 04/14/22 0424 04/15/22 0554  NA 131* 134* 137 136 137 138  K 4.0 4.4 4.4 3.8 3.7 3.8  CL 95* 97* 94* 94* 93* 93*  CO2 '23 25 29 31 '$ 35* 36*  ANIONGAP '13 12 14 11 9 9  '$ GLUCOSE 116* 155* 153* 137* 144* 134*  BUN '16 14 17 19 16 12  '$ CREATININE 0.94 0.99 0.75 0.68 0.53 0.57  AST 49*  --   --   --   --   --   ALT 31  --   --   --   --    --   ALKPHOS 102  --   --   --   --   --   BILITOT 1.3*  --   --   --   --   --   ALBUMIN 4.2  --   --   --   --   --   CRP  --   --   --  19.6* 15.3* 10.9*  PROCALCITON  --   --  16.79 6.93 3.44 1.63  TSH  --   --   --   --  3.180  --   BNP  --   --  199.3* 74.4 48.7 59.2  MG  --  1.8  --  2.0 2.0 2.1  CALCIUM 9.3 8.8* 8.9 8.9 9.0 9.0    RADIOLOGY STUDIES/RESULTS: No results found.   LOS: 4 days   Signature  -  Lala Lund M.D on 04/15/2022 at 8:59 AM   -  To page go to [email or web address]

## 2022-04-15 NOTE — Progress Notes (Signed)
Physical Therapy Treatment Patient Details Name: Tricia Perry MRN: [id number] DOB: 01/31/1956 Today's Date: 04/15/2022   History of Present Illness Patient is a 67 y.o.  female admitted 1/11 presented with right flank pain-was found to have right UPJ junction stone with obstruction causing complicated UTI with sepsis physiology.  undersent cystoscopy with stent.  PMH:  chronic hypoxic/hypercarbic respiratory failure on around 3 L of oxygen at home-trilogy machine at night, chronic HFpEF, morbidly obese    PT Comments    Pt tolerated today's session well, limited by endurance and feeling nauseous, RN present and aware. Pt's husband present throughout today's session, encouraging pt throughout. Pt requiring minA to stand with verbal and tactile cues to push-up from her seated surface, however pt pulling up on RW and husband. Pt desatting to ~91% on first ambulation trial and ~86% with second, on 6L O2 HFNC, but recovers back to low 90s with seated rest break. Pt ambulates with B forearms resting on RW, despite verbal cues for upright posture. Pt will continue to benefit from skilled acute PT at this time, discharge recommendations remain appropriate.     Recommendations for follow up therapy are one component of a multi-disciplinary discharge planning process, led by the attending physician.  Recommendations may be updated based on patient status, additional functional criteria and insurance authorization.  Follow Up Recommendations  Home health PT     Assistance Recommended at Discharge Frequent or constant Supervision/Assistance  Patient can return home with the following A lot of help with walking and/or transfers;A lot of help with bathing/dressing/bathroom;Assistance with cooking/housework;Assist for transportation;Help with stairs or ramp for entrance   Equipment Recommendations  None recommended by PT    Recommendations for Other Services       Precautions / Restrictions  Precautions Precautions: Fall Precaution Comments: watch HR, 3L O2 (on home O2) Restrictions Weight Bearing Restrictions: No     Mobility  Bed Mobility               General bed mobility comments: pt in recliner upon arrival    Transfers Overall transfer level: Needs assistance Equipment used: Rolling walker (2 wheels) Transfers: Sit to/from Stand Sit to Stand: Min assist           General transfer comment: pt pulling up on RW, despite verbal and tactile cueing to push up from sitting service    Ambulation/Gait Ambulation/Gait assistance: Min assist Gait Distance (Feet): 15 Feet (x2 trials, with a seated rest break between trials) Assistive device: Rolling walker (2 wheels) Gait Pattern/deviations: Wide base of support, Trunk flexed, Shuffle, Decreased stride length Gait velocity: decreased     General Gait Details: pt ambulating with forearms on RW, assistance provided for balance, as well as negotiation of RW in room   Stairs             Wheelchair Mobility    Modified Rankin (Stroke Patients Only)       Balance Overall balance assessment: Needs assistance Sitting-balance support: No upper extremity supported, Feet supported Sitting balance-Leahy Scale: Good Sitting balance - Comments: no sway noted with feet supported and no back or UE support   Standing balance support: Bilateral upper extremity supported, During functional activity Standing balance-Leahy Scale: Fair Standing balance comment: relies on RW with forearm support, assistance required for safety                            Cognition Arousal/Alertness: Awake/alert Behavior During  Therapy: WFL for tasks assessed/performed Overall Cognitive Status: Within Functional Limits for tasks assessed                                 General Comments: Pt reports feeling nauseous with mobility, resolves once seated        Exercises      General Comments  General comments (skin integrity, edema, etc.): Pt desatting to ~91% on 6L O2 HFNC with first ambulation trial, 86% with second trial, recovering with both to low 90s with seated rest break and cueing for deep breathing      Pertinent Vitals/Pain Pain Assessment Pain Assessment: 0-10 Pain Score: 8  Pain Location: back and BLE Pain Descriptors / Indicators: Aching Pain Intervention(s): Monitored during session    Home Living                          Prior Function            PT Goals (current goals can now be found in the care plan section) Acute Rehab PT Goals Patient Stated Goal: to go home PT Goal Formulation: With patient Time For Goal Achievement: 04/26/22 Potential to Achieve Goals: Good Progress towards PT goals: Progressing toward goals    Frequency    Min 3X/week      PT Plan Current plan remains appropriate    Co-evaluation              AM-PAC PT "6 Clicks" Mobility   Outcome Measure  Help needed turning from your back to your side while in a flat bed without using bedrails?: A Little Help needed moving from lying on your back to sitting on the side of a flat bed without using bedrails?: A Lot Help needed moving to and from a bed to a chair (including a wheelchair)?: A Lot Help needed standing up from a chair using your arms (e.g., wheelchair or bedside chair)?: A Lot Help needed to walk in hospital room?: A Lot Help needed climbing 3-5 steps with a railing? : Total 6 Click Score: 12    End of Session Equipment Utilized During Treatment: Gait belt;Oxygen Activity Tolerance: Patient limited by fatigue Patient left: in chair;with call bell/phone within reach;with family/visitor present Nurse Communication: Mobility status PT Visit Diagnosis: Unsteadiness on feet (R26.81);Difficulty in walking, not elsewhere classified (R26.2)     Time: 6712-4580 PT Time Calculation (min) (ACUTE ONLY): 34 min  Charges:  $Gait Training: 8-22  mins $Therapeutic Activity: 8-22 mins                     Charlynne Cousins, PT DPT Acute Rehabilitation Services Office [phone]    Tricia Perry 04/15/2022, 12:31 PM

## 2022-04-15 NOTE — Plan of Care (Signed)

## 2022-04-16 ENCOUNTER — Inpatient Hospital Stay (HOSPITAL_COMMUNITY): Payer: Medicare Other

## 2022-04-16 DIAGNOSIS — N201 Calculus of ureter: Secondary | ICD-10-CM | POA: Diagnosis not present

## 2022-04-16 LAB — CBC WITH DIFFERENTIAL/PLATELET
Abs Immature Granulocytes: 0.16 10*3/uL — ABNORMAL HIGH (ref 0.00–0.07)
Basophils Absolute: 0.1 10*3/uL (ref 0.0–0.1)
Basophils Relative: 1 %
Eosinophils Absolute: 0.4 10*3/uL (ref 0.0–0.5)
Eosinophils Relative: 5 %
HCT: 41.8 % (ref 36.0–46.0)
Hemoglobin: 12.9 g/dL (ref 12.0–15.0)
Immature Granulocytes: 2 %
Lymphocytes Relative: 24 %
Lymphs Abs: 2 10*3/uL (ref 0.7–4.0)
MCH: 29.9 pg (ref 26.0–34.0)
MCHC: 30.9 g/dL (ref 30.0–36.0)
MCV: 97 fL (ref 80.0–100.0)
Monocytes Absolute: 0.9 10*3/uL (ref 0.1–1.0)
Monocytes Relative: 11 %
Neutro Abs: 4.7 10*3/uL (ref 1.7–7.7)
Neutrophils Relative %: 57 %
Platelets: 276 10*3/uL (ref 150–400)
RBC: 4.31 MIL/uL (ref 3.87–5.11)
RDW: 14.1 % (ref 11.5–15.5)
WBC: 8.2 10*3/uL (ref 4.0–10.5)
nRBC: 0 % (ref 0.0–0.2)

## 2022-04-16 LAB — CULTURE, BLOOD (ROUTINE X 2)
Culture: NO GROWTH
Culture: NO GROWTH
Special Requests: ADEQUATE
Special Requests: ADEQUATE

## 2022-04-16 LAB — BASIC METABOLIC PANEL
Anion gap: 13 (ref 5–15)
BUN: 11 mg/dL (ref 8–23)
CO2: 33 mmol/L — ABNORMAL HIGH (ref 22–32)
Calcium: 9.3 mg/dL (ref 8.9–10.3)
Chloride: 93 mmol/L — ABNORMAL LOW (ref 98–111)
Creatinine, Ser: 0.58 mg/dL (ref 0.44–1.00)
GFR, Estimated: >60 mL/min (ref >60)
Glucose, Bld: 122 mg/dL — ABNORMAL HIGH (ref 70–99)
Potassium: 3.7 mmol/L (ref 3.5–5.1)
Sodium: 139 mmol/L (ref 135–145)

## 2022-04-16 LAB — BRAIN NATRIURETIC PEPTIDE: B Natriuretic Peptide: 26.7 pg/mL (ref 0.0–100.0)

## 2022-04-16 LAB — MAGNESIUM: Magnesium: 1.9 mg/dL (ref 1.7–2.4)

## 2022-04-16 LAB — C-REACTIVE PROTEIN: CRP: 7.8 mg/dL — ABNORMAL HIGH (ref <1.0)

## 2022-04-16 LAB — PROCALCITONIN: Procalcitonin: 0.8 ng/mL

## 2022-04-16 MED ORDER — PHENAZOPYRIDINE HCL 200 MG PO TABS: 200.0000 mg | ORAL_TABLET | Freq: Three times a day (TID) | ORAL | Status: DC | PRN

## 2022-04-16 MED ORDER — POTASSIUM CHLORIDE CRYS ER 20 MEQ PO TBCR
40.0000 meq | EXTENDED_RELEASE_TABLET | Freq: Two times a day (BID) | ORAL | Status: AC
  Administered 2022-04-16 (×2): 40 meq via ORAL
  Filled 2022-04-16 (×2): qty 2

## 2022-04-16 MED ORDER — FUROSEMIDE 10 MG/ML IJ SOLN
60.0000 mg | Freq: Two times a day (BID) | INTRAMUSCULAR | Status: AC
  Administered 2022-04-16 (×2): 60 mg via INTRAVENOUS
  Filled 2022-04-16 (×2): qty 6

## 2022-04-16 MED ORDER — FUROSEMIDE 10 MG/ML IJ SOLN: 60.0000 mg | Freq: Two times a day (BID) | INTRAMUSCULAR | Status: DC

## 2022-04-16 MED ORDER — POTASSIUM CHLORIDE CRYS ER 20 MEQ PO TBCR: 40.0000 meq | EXTENDED_RELEASE_TABLET | Freq: Once | ORAL | Status: DC

## 2022-04-16 MED ORDER — SPIRONOLACTONE 25 MG PO TABS: 25.0000 mg | ORAL_TABLET | Freq: Once | ORAL | Status: DC

## 2022-04-16 MED ORDER — SPIRONOLACTONE 25 MG PO TABS
25.0000 mg | ORAL_TABLET | Freq: Once | ORAL | Status: AC
  Administered 2022-04-16: 25 mg via ORAL
  Filled 2022-04-16: qty 1

## 2022-04-16 NOTE — Progress Notes (Addendum)
PATIENT DETAILS Name: Tricia Perry Age: 67 y.o. Sex: female Date of Birth: Dec 19, 1955 Admit Date: 04/10/2022 Admitting Physician Evalee Mutton Kristeen Mans, MD OVF:[id number], Truddie Crumble, PA-C  Brief Summary: Patient is a 67 y.o.  female with history of chronic hypoxic/hypercarbic respiratory failure on around 3 L of oxygen at home-trilogy machine at night, chronic HFpEF, morbidly obese who presented with right flank pain-was found to have right UPJ junction stone with obstruction causing complicated UTI with sepsis physiology.   Significant events: 1/11>> admit to [facility]-right UPJ stone with hydronephrosis-sepsis/complicated UTI 1/88>> cystoscopy/right JJ stent placement.  Postoperatively worsening hypoxemia-briefly required BiPAP-given Lasix-transitioned to HFNC-8-10 L.  CXR with significant infiltrates. 1/13>> down to 5 L of HFNC   Significant studies: 1/11>> CXR: Mild bibasilar atelectasis/infiltrate 1/12>> CT renal stone study: Obstructing 1.0 cm stone right UPJ junction with moderate right hydronephrosis. 1/12>> CXR: Markedly increased bilateral airspace opacities 1/13>> CXR: Extensive patchy bilateral airspace disease.   Significant microbiology data: 1/12>> urine culture: Pending 1/12>> blood culture: neg   Procedures: 1/12>>Cystoscopy, right retrograde pyelogram with intraoperative interpretation, insertion right JJ stent    Consults: Urology  Subjective:  Overnight: NAEON   Doing well. Denies any acute concerns. States did well with CPAP overnight.  Objective: Slightly hypertensive.   Vital signs in last 24 hours: Vitals:   04/15/22 2145 04/15/22 2202 04/15/22 2342 04/16/22 0326  BP: (!) 104/52  (!) 157/77 (!) 143/67  Pulse: (!) 108  89 86  Resp: '18  17 17  '$ Temp:  98.3 F (36.8 C) 98.2 F (36.8 C) 98.2 F (36.8 C)  TempSrc:  Oral Oral Oral  SpO2: 94%  93% 92%  Weight:      Height:       Supplemental O2: Nasal Cannula Last BM Date : 04/13/22 SpO2: 91 % O2  Flow Rate (L/min): 4 L/min FiO2 (%): 90 % Filed Weights   04/10/22 2250 04/14/22 0300 04/15/22 0500  Weight: (!) 140.6 kg (!) 174.5 kg (!) 176.6 kg    Intake/Output Summary (Last 24 hours) at 04/16/2022 0552 Last data filed at 04/16/2022 0100 Gross per 24 hour  Intake --  Output 3175 ml  Net -3175 ml   Net IO Since Admission: -12,476 mL [04/16/22 0552] Physical Exam General: NAD, obese female laying comfortably in bed HENT: NCAT Lungs:  CTAB, diminished at bases Cardiovascular: NSR Abdomen: No TTP  MSK: no asymmetry Skin: no lesions on exposed skin Neuro: alert and oriented x4 Psych: normal mood and normal affect  Diagnostics    Latest Ref Rng & Units 04/15/2022    5:54 AM 04/14/2022    4:24 AM 04/13/2022    5:05 AM  CBC  WBC 4.0 - 10.5 K/uL 7.7  9.8  13.9   Hemoglobin 12.0 - 15.0 g/dL 11.7  11.8  12.0   Hematocrit 36.0 - 46.0 % 38.0  38.2  37.9   Platelets 150 - 400 K/uL 257  240  245        Latest Ref Rng & Units 04/15/2022    5:54 AM 04/14/2022    4:24 AM 04/13/2022    5:05 AM  BMP  Glucose 70 - 99 mg/dL 134  144  137   BUN 8 - 23 mg/dL '12  16  19   '$ Creatinine 0.44 - 1.00 mg/dL 0.57  0.53  0.68   Sodium 135 - 145 mmol/L 138  137  136   Potassium 3.5 - 5.1 mmol/L 3.8  3.7  3.8   Chloride 98 -  111 mmol/L 93  93  94   CO2 22 - 32 mmol/L 36  35  31   Calcium 8.9 - 10.3 mg/dL 9.0  9.0  8.9      Assessment/Plan: Patient is a 67 y.o.  female with history of chronic hypoxic/hypercarbic respiratory failure on around 3 L of oxygen at home-trilogy machine at night, chronic HFpEF, morbidly obese who presented with right flank pain-was found to have right UPJ junction stone with obstruction causing complicated UTI with sepsis physiology. Principal Problem:   Obstruction of right ureteropelvic junction (UPJ) due to stone Active Problems:   Acute hypoxemic respiratory failure (HCC)  Sepsis due to complicated UTI in setting of right UPJ stone with obstruction Sepsis resolved.  On Ceftriaxone 2 g per qd and flagyl q12hr. Bcx negative. Urine Cx showing >100k E. Coli with sensitivity to ceftriaxone, cefazolin, cipro and bactrim. Will continue IV abx today and switch to PO tomorrow.    Right UPJ stone c/b hydronephrosis  S/p cystoscopy with stent placement by urology. Urology following. Will dc foley catheter.   Acute on chronic hypoxic respiratory failure, HFpEF exac, Possible aspiration episode postoperatively Improving. Secondary to aspiration pneumonia vs CHF exacerbation. Procal elevated but pt has bacterial UTI. On abx for complicated UTI which will cover PNA if present. Pt responding well to diuresis with 2.4 L out yesterday.  Continue IV diuresis with lasix 60 mg BID.  -Continue to titrate down oxygen.  Of note uses BiPAP at home for OSA.    HTN  Chronic. On lopressor 50 mg BID.   Chronic hypoxic/hypercarbic respiratory failure OSA/OHS At home on 2.5-3 L of oxygen during the daytime, and trilogy ventilator nightly -BIPAP qhs here while hospitalized   History of asthma Chronic. Not in exacerbation -Continue bronchodilators   Hypothyroidism Chronic. Continue levothyroxine   Morbid obesity Lymphedema Chronic. Supportive care Diuretics as able   Morbid Obesity: Estimated body mass index is 66.83 kg/m as calculated from the following:   Height as of this encounter: '5\' 4"'$  (1.626 m).   Weight as of this encounter: 176.6 kg.   Diet: Heart Healthy IVF: None,None VTE: Enoxaparin Code: Full PT/OT recs: Home Health, none. Prior to Admission Living Arrangement: home  Anticipated Discharge Location: home Barriers to Discharge: Medical work up Dispo: Anticipated discharge in approximately 1 day(s).   Idamae Schuller, MD Tillie Rung. [facility] Internal Medicine Residency, PGY-2  Pager: [phone] After 5 pm and on weekends: Please call the on-call pager    Attestation  I have directly reviewed the clinical findings, lab results and imaging  studies. I have interviewed and examined the patient and agree with the documentation and management as recorded by Dr.Khan, she is improving from hypoxia caused by CHF and aspiration pneumonia, continue IV Lasix high-dose on 04/16/2022 along with antibiotics.  Oxygen demand is improving prepare for discharge in the next 1 to 2 days.  Lala Lund M.D on 04/16/2022 at [facility] Office  [phone]

## 2022-04-17 ENCOUNTER — Other Ambulatory Visit (HOSPITAL_COMMUNITY): Payer: Self-pay

## 2022-04-17 DIAGNOSIS — N201 Calculus of ureter: Secondary | ICD-10-CM | POA: Diagnosis not present

## 2022-04-17 LAB — CBC WITH DIFFERENTIAL/PLATELET
Abs Immature Granulocytes: 0.21 10*3/uL — ABNORMAL HIGH (ref 0.00–0.07)
Basophils Absolute: 0.1 10*3/uL (ref 0.0–0.1)
Basophils Relative: 1 %
Eosinophils Absolute: 0.5 10*3/uL (ref 0.0–0.5)
Eosinophils Relative: 5 %
HCT: 40.6 % (ref 36.0–46.0)
Hemoglobin: 12.7 g/dL (ref 12.0–15.0)
Immature Granulocytes: 2 %
Lymphocytes Relative: 20 %
Lymphs Abs: 2.1 10*3/uL (ref 0.7–4.0)
MCH: 30 pg (ref 26.0–34.0)
MCHC: 31.3 g/dL (ref 30.0–36.0)
MCV: 96 fL (ref 80.0–100.0)
Monocytes Absolute: 1 10*3/uL (ref 0.1–1.0)
Monocytes Relative: 10 %
Neutro Abs: 6.5 10*3/uL (ref 1.7–7.7)
Neutrophils Relative %: 62 %
Platelets: 321 10*3/uL (ref 150–400)
RBC: 4.23 MIL/uL (ref 3.87–5.11)
RDW: 14.3 % (ref 11.5–15.5)
WBC: 10.3 10*3/uL (ref 4.0–10.5)
nRBC: 0 % (ref 0.0–0.2)

## 2022-04-17 LAB — BASIC METABOLIC PANEL
Anion gap: 12 (ref 5–15)
BUN: 18 mg/dL (ref 8–23)
CO2: 36 mmol/L — ABNORMAL HIGH (ref 22–32)
Calcium: 9.4 mg/dL (ref 8.9–10.3)
Chloride: 90 mmol/L — ABNORMAL LOW (ref 98–111)
Creatinine, Ser: 0.71 mg/dL (ref 0.44–1.00)
GFR, Estimated: >60 mL/min (ref >60)
Glucose, Bld: 142 mg/dL — ABNORMAL HIGH (ref 70–99)
Potassium: 3.9 mmol/L (ref 3.5–5.1)
Sodium: 138 mmol/L (ref 135–145)

## 2022-04-17 LAB — PROCALCITONIN: Procalcitonin: 0.61 ng/mL

## 2022-04-17 MED ORDER — POTASSIUM CHLORIDE CRYS ER 20 MEQ PO TBCR
40.0000 meq | EXTENDED_RELEASE_TABLET | Freq: Two times a day (BID) | ORAL | Status: DC
  Administered 2022-04-17: 40 meq via ORAL
  Filled 2022-04-17: qty 2

## 2022-04-17 MED ORDER — FUROSEMIDE 40 MG PO TABS
40.0000 mg | ORAL_TABLET | Freq: Two times a day (BID) | ORAL | 11 refills | Status: AC
  Filled 2022-04-17: qty 60, 30d supply, fill #0

## 2022-04-17 MED ORDER — METOPROLOL TARTRATE 50 MG PO TABS
50.0000 mg | ORAL_TABLET | Freq: Two times a day (BID) | ORAL | 0 refills | Status: AC
  Filled 2022-04-17: qty 60, 30d supply, fill #0

## 2022-04-17 MED ORDER — POTASSIUM CHLORIDE CRYS ER 20 MEQ PO TBCR
40.0000 meq | EXTENDED_RELEASE_TABLET | Freq: Every day | ORAL | 0 refills | Status: AC
  Filled 2022-04-17: qty 60, 30d supply, fill #0

## 2022-04-17 MED ORDER — METOLAZONE 2.5 MG PO TABS
2.5000 mg | ORAL_TABLET | Freq: Once | ORAL | Status: AC
  Administered 2022-04-17: 2.5 mg via ORAL
  Filled 2022-04-17: qty 1

## 2022-04-17 MED ORDER — FUROSEMIDE 10 MG/ML IJ SOLN
60.0000 mg | Freq: Two times a day (BID) | INTRAMUSCULAR | Status: DC
  Administered 2022-04-17: 60 mg via INTRAVENOUS
  Filled 2022-04-17: qty 6

## 2022-04-17 MED ORDER — LISINOPRIL 10 MG PO TABS
10.0000 mg | ORAL_TABLET | Freq: Every day | ORAL | 0 refills | Status: AC
  Filled 2022-04-17: qty 30, 30d supply, fill #0

## 2022-04-17 MED ORDER — SPIRONOLACTONE 25 MG PO TABS
25.0000 mg | ORAL_TABLET | Freq: Once | ORAL | Status: AC
  Administered 2022-04-17: 25 mg via ORAL
  Filled 2022-04-17: qty 1

## 2022-04-17 MED ORDER — CIPROFLOXACIN HCL 500 MG PO TABS
500.0000 mg | ORAL_TABLET | Freq: Two times a day (BID) | ORAL | 0 refills | Status: AC
  Filled 2022-04-17: qty 8, 4d supply, fill #0

## 2022-04-17 NOTE — Progress Notes (Addendum)
SATURATION QUALIFICATIONS: (This note is used to comply with regulatory documentation for home oxygen)  Patient Saturations on Room Air at Rest = 86%  Patient Saturations on Room Air while Ambulating = deferred as pt desattting at rest on room air  Patient Saturations on 4L at Rest = 94%  Patient Saturations on 4 Liters of oxygen while Ambulating = 89%  Please briefly explain why patient needs home oxygen: Pt unable to maintain SPO2 on room air at rest, requiring 6L to maintain SPO2 >88% to allow pt to mobilize safely in her home.

## 2022-04-17 NOTE — Plan of Care (Signed)

## 2022-04-17 NOTE — Progress Notes (Signed)
Physical Therapy Treatment Patient Details Name: Tricia Perry MRN: [id number] DOB: 12/03/1955 Today's Date: 04/17/2022   History of Present Illness Patient is a 67 y.o.  female admitted 1/11 presented with right flank pain-was found to have right UPJ junction stone with obstruction causing complicated UTI with sepsis physiology.  undersent cystoscopy with stent.  PMH:  chronic hypoxic/hypercarbic respiratory failure on around 3 L of oxygen at home-trilogy machine at night, chronic HFpEF, morbidly obese    PT Comments    Pt tolerated today's session well, excited for potential discharge today. Pt desatting on room air to 86%, recovering on 4L O2 [place] to 92% and able to ambulate with SPO2 at 89%. Pt requires minA for tranfers and ambulation today with use of RW, assistance for balance and safety. Acute PT will continue to follow up with pt during admission, discharge recommendations are appropriate.     Recommendations for follow up therapy are one component of a multi-disciplinary discharge planning process, led by the attending physician.  Recommendations may be updated based on patient status, additional functional criteria and insurance authorization.  Follow Up Recommendations  Home health PT     Assistance Recommended at Discharge Frequent or constant Supervision/Assistance  Patient can return home with the following A lot of help with walking and/or transfers;A lot of help with bathing/dressing/bathroom;Assistance with cooking/housework;Assist for transportation;Help with stairs or ramp for entrance   Equipment Recommendations  None recommended by PT    Recommendations for Other Services       Precautions / Restrictions Precautions Precautions: Fall Precaution Comments: watch HR, 3L O2 (on home O2) Restrictions Weight Bearing Restrictions: No     Mobility  Bed Mobility               General bed mobility comments: pt in recliner upon arrival    Transfers Overall  transfer level: Needs assistance Equipment used: Rolling walker (2 wheels) Transfers: Sit to/from Stand Sit to Stand: Min assist           General transfer comment: pt pulling up on RW, despite verbal and tactile cueing to push up from sitting service    Ambulation/Gait Ambulation/Gait assistance: Min assist Gait Distance (Feet): 10 Feet Assistive device: Rolling walker (2 wheels) Gait Pattern/deviations: Wide base of support, Trunk flexed, Shuffle, Decreased stride length Gait velocity: decreased     General Gait Details: pt ambulating with forearms on RW, assistance provided for balance, as well as negotiation of RW in room   Stairs             Wheelchair Mobility    Modified Rankin (Stroke Patients Only)       Balance Overall balance assessment: Needs assistance Sitting-balance support: No upper extremity supported, Feet supported Sitting balance-Leahy Scale: Good Sitting balance - Comments: no sway noted with feet supported and no back or UE support   Standing balance support: Bilateral upper extremity supported, During functional activity Standing balance-Leahy Scale: Fair Standing balance comment: relies on RW with forearm support, assistance required for safety                            Cognition Arousal/Alertness: Awake/alert Behavior During Therapy: WFL for tasks assessed/performed Overall Cognitive Status: Within Functional Limits for tasks assessed  Exercises      General Comments General comments (skin integrity, edema, etc.): Pt's husband leaving room during beginning of session, pt desatting on room air, tolerating mobility well on 4L O2 [place]      Pertinent Vitals/Pain Pain Assessment Pain Assessment: No/denies pain    Home Living                          Prior Function            PT Goals (current goals can now be found in the care plan section) Acute  Rehab PT Goals Patient Stated Goal: to go home PT Goal Formulation: With patient Time For Goal Achievement: 04/26/22 Potential to Achieve Goals: Good Progress towards PT goals: Progressing toward goals    Frequency    Min 3X/week      PT Plan Current plan remains appropriate    Co-evaluation              AM-PAC PT "6 Clicks" Mobility   Outcome Measure  Help needed turning from your back to your side while in a flat bed without using bedrails?: A Little Help needed moving from lying on your back to sitting on the side of a flat bed without using bedrails?: A Lot Help needed moving to and from a bed to a chair (including a wheelchair)?: A Lot Help needed standing up from a chair using your arms (e.g., wheelchair or bedside chair)?: A Lot Help needed to walk in hospital room?: A Lot Help needed climbing 3-5 steps with a railing? : Total 6 Click Score: 12    End of Session Equipment Utilized During Treatment: Oxygen Activity Tolerance: Patient limited by fatigue Patient left: in chair;with call bell/phone within reach Nurse Communication: Mobility status PT Visit Diagnosis: Unsteadiness on feet (R26.81);Difficulty in walking, not elsewhere classified (R26.2)     Time: 0623-7628 PT Time Calculation (min) (ACUTE ONLY): 20 min  Charges:  $Therapeutic Activity: 8-22 mins                     Charlynne Cousins, PT DPT Acute Rehabilitation Services Office [phone]    Luvenia Heller 04/17/2022, 12:47 PM

## 2022-04-17 NOTE — Discharge Instructions (Signed)
Follow with Primary MD [name], PA-C in 7 days   Get CBC, CMP, Magnesium, 2 view Chest X ray -  checked next visit with your primary MD   Activity: As tolerated with Full fall precautions use walker/cane & assistance as needed  Disposition Home   Diet: Heart Healthy with 1.5 lit/day restriction.  Check your Weight same time everyday, if you gain over 2 pounds, or you develop in leg swelling, experience more shortness of breath or chest pain, call your Primary MD immediately. Follow Cardiac Low Salt Diet and 1.5 lit/day fluid restriction.  Special Instructions: If you have smoked or chewed Tobacco  in the last 2 yrs please stop smoking, stop any regular Alcohol  and or any Recreational drug use.  On your next visit with your primary care physician please Get Medicines reviewed and adjusted.  Please request your Prim.MD to go over all Hospital Tests and Procedure/Radiological results at the follow up, please get all Hospital records sent to your Prim MD by signing hospital release before you go home.  If you experience worsening of your admission symptoms, develop shortness of breath, life threatening emergency, suicidal or homicidal thoughts you must seek medical attention immediately by calling 911 or calling your MD immediately  if symptoms less severe.  You Must read complete instructions/literature along with all the possible adverse reactions/side effects for all the Medicines you take and that have been prescribed to you. Take any new Medicines after you have completely understood and accpet all the possible adverse reactions/side effects.

## 2022-04-17 NOTE — Care Management Important Message (Signed)
Important Message  Patient Details  Name: Tricia Perry MRN: [id number] Date of Birth: 01-May-1955   Medicare Important Message Given:  Yes   Patient left prior to IM delivery will mail to the patient home address.    Romanita Fager 04/17/2022, 3:26 PM

## 2022-04-17 NOTE — Progress Notes (Signed)
Pt placed on CPAP and tolerating well with svs.

## 2022-04-17 NOTE — Discharge Summary (Signed)
Tricia Perry ZYS:[id number] DOB: 24-Mar-1956 DOA: 04/10/2022  PCP: [name], PA-C  Admit date: 04/10/2022  Discharge date: 04/17/2022  Admitted From: Home   Disposition:  Home   Recommendations for Outpatient Follow-up:   Follow up with PCP in 1-2 weeks  PCP Please obtain BMP/CBC, 2 view CXR in 1week,  (see Discharge instructions)   PCP Please follow up on the following pending results: Monitor blood pressure, BMP, magnesium closely.  Needs close outpatient follow-up with urology and primary cardiologist.   Home Health: PT, if qualifies   Equipment/Devices: 02  Consultations: Urology Discharge Condition: Stable    CODE STATUS: Full    Diet Recommendation: Heart Healthy - 1.5 L fluid restriction per day  Chief Complaint  Patient presents with   Back Pain   Shortness of Breath     Brief history of present illness from the day of admission and additional interim summary    68 y.o.  female with history of chronic hypoxic/hypercarbic respiratory failure on around 3 L of oxygen at home-trilogy machine at night, chronic HFpEF, morbidly obese who presented with right flank pain-was found to have right UPJ junction stone with obstruction causing complicated UTI with sepsis physiology.   Significant events: 1/11>> admit to [facility]-right UPJ stone with hydronephrosis-sepsis/complicated UTI 9/32>> cystoscopy/right JJ stent placement.  Postoperatively worsening hypoxemia-briefly required BiPAP-given Lasix-transitioned to HFNC-8-10 L.  CXR with significant infiltrates. 1/13>> down to 5 L of HFNC   Significant studies: 1/11>> CXR: Mild bibasilar atelectasis/infiltrate 1/12>> CT renal stone study: Obstructing 1.0 cm stone right UPJ junction with moderate right hydronephrosis. 1/12>> CXR: Markedly increased  bilateral airspace opacities 1/13>> CXR: Extensive patchy bilateral airspace disease.   Significant microbiology data: 1/12>> urine culture: Pending 1/12>> blood culture: neg   Procedures: 1/12>>Cystoscopy, right retrograde pyelogram with intraoperative interpretation, insertion right JJ stent    Consults: Urology                                                                 Hospital Course   Sepsis due to complicated UTI in setting of right UPJ stone with obstruction Sepsis resolved. On Ceftriaxone 2 g per qd and flagyl q12hr. Bcx negative. Urine Cx showing >100k E. Coli with sensitivity to ceftriaxone, cefazolin, cipro and bactrim.  Clinically has defervesced will be discharged on 4 more days of oral ciprofloxacin to complete a total of 10-day course.  Will follow-up with urology and PCP.   Right UPJ stone c/b hydronephrosis  S/p cystoscopy with stent placement by urology. Urology following.  Foley catheter removed on 04/16/2022.  Stable.  Will be discharged with outpatient urology and PCP follow-up.   Acute on chronic hypoxic respiratory failure, HFpEF exac, Possible aspiration episode postoperatively Improving. Secondary to aspiration pneumonia vs CHF exacerbation.  She was treated with IV  antibiotics along with high-dose IV diuretics, responded well initially was requiring 10 to 2 L of high flow oxygen along with nighttime BiPAP now down to 4 L nasal cannula oxygen and symptom-free will be discharged on diuretics along with antibiotic regimen as above.  PCP to monitor blood pressure, BMP and diuretic dose closely.    HTN  Chronic. On lopressor 50 mg BID low-dose ACE inhibitor, PCP to monitor blood pressure along with BMP closely.    Chronic hypoxic/hypercarbic respiratory failure OSA/OHS At home on 2.5-3 L of oxygen during the daytime, and trilogy ventilator nightly, currently on 4 L oxygen and being discharged on 4 100 assistance -BIPAP qhs here while hospitalized   History of  asthma Chronic. Not in exacerbation -Continue bronchodilators   Hypothyroidism Chronic. Continue levothyroxine, PCP to monitor TSH intermittently   Morbid obesity of 63 follow with PCP for weight loss Lymphedema Chronic. Supportive care Diuretics adjusted upon discharge PCP to monitor.   Discharge diagnosis     Principal Problem:   Obstruction of right ureteropelvic junction (UPJ) due to stone Active Problems:   Acute hypoxemic respiratory failure [facility])    Discharge instructions    Discharge Instructions     Discharge instructions   Complete by: As directed    Follow with Primary MD [name], PA-C in 7 days   Get CBC, CMP, Magnesium, 2 view Chest X ray -  checked next visit with your primary MD   Activity: As tolerated with Full fall precautions use walker/cane & assistance as needed  Disposition Home   Diet: Heart Healthy with 1.5 lit/day restriction.  Check your Weight same time everyday, if you gain over 2 pounds, or you develop in leg swelling, experience more shortness of breath or chest pain, call your Primary MD immediately. Follow Cardiac Low Salt Diet and 1.5 lit/day fluid restriction.  Special Instructions: If you have smoked or chewed Tobacco  in the last 2 yrs please stop smoking, stop any regular Alcohol  and or any Recreational drug use.  On your next visit with your primary care physician please Get Medicines reviewed and adjusted.  Please request your Prim.MD to go over all Hospital Tests and Procedure/Radiological results at the follow up, please get all Hospital records sent to your Prim MD by signing hospital release before you go home.  If you experience worsening of your admission symptoms, develop shortness of breath, life threatening emergency, suicidal or homicidal thoughts you must seek medical attention immediately by calling 911 or calling your MD immediately  if symptoms less severe.  You Must read complete  instructions/literature along with all the possible adverse reactions/side effects for all the Medicines you take and that have been prescribed to you. Take any new Medicines after you have completely understood and accpet all the possible adverse reactions/side effects.   Increase activity slowly   Complete by: As directed        Discharge Medications   Allergies as of 04/17/2022       Reactions   Gabapentin Swelling   Legs    Mold Extract [trichophyton] Nausea And Vomiting, Other (See Comments)   Sneezing real bad    Morphine Itching, Other (See Comments)   back hurts   Statins Other (See Comments)   Muscle pain   Codeine Itching, Other (See Comments)   Back hurts        Medication List     STOP taking these medications    amLODipine 5 MG tablet Commonly known  as: NORVASC   IRON PO   losartan 100 MG tablet Commonly known as: COZAAR       TAKE these medications    acetaminophen 325 MG tablet Commonly known as: TYLENOL Take 650 mg by mouth every 6 (six) hours as needed for mild pain.   albuterol 108 (90 Base) MCG/ACT inhaler Commonly known as: VENTOLIN HFA Inhale 2 puffs into the lungs every 6 (six) hours as needed for wheezing or shortness of breath.   albuterol (2.5 MG/3ML) 0.083% nebulizer solution Commonly known as: PROVENTIL Take 3 mLs (2.5 mg total) by nebulization every 6 (six) hours as needed for wheezing or shortness of breath.   ascorbic acid 500 MG tablet Commonly known as: VITAMIN C Take 500 mg by mouth daily.   aspirin EC 81 MG tablet Take 81 mg by mouth daily. Swallow whole.   AZO Cranberry 250-30 MG Tabs Take 2 tablets by mouth daily as needed (pain).   ciprofloxacin 500 MG tablet Commonly known as: CIPRO Take 1 tablet (500 mg total) by mouth 2 (two) times daily for 4 days.   furosemide 40 MG tablet Commonly known as: Lasix Take 1 tablet (40 mg total) by mouth 2 (two) times daily. What changed:  medication strength See the new  instructions.   levothyroxine 200 MCG tablet Commonly known as: SYNTHROID TAKE ONE TABLET BY MOUTH ONE TIME DAILY before breakfast What changed:  how much to take how to take this when to take this additional instructions   lisinopril 10 MG tablet Commonly known as: ZESTRIL Take 1 tablet (10 mg total) by mouth daily.   MAGNESIUM-ZINC PO Take 1 tablet by mouth daily.   metoprolol tartrate 50 MG tablet Commonly known as: LOPRESSOR Take 1 tablet (50 mg total) by mouth 2 (two) times daily.   montelukast 10 MG tablet Commonly known as: SINGULAIR Take 10 mg by mouth in the morning.   multivitamin capsule Take 1 capsule by mouth daily.   OXYGEN Inhale 2.5 L into the lungs continuous.   potassium chloride SA 20 MEQ tablet Commonly known as: KLOR-CON M Take 2 tablets (40 mEq total) by mouth daily.   Vitamin D (Ergocalciferol) 1.25 MG (50000 UNIT) Caps capsule Commonly known as: DRISDOL Take 50,000 Units by mouth once a week. Take on Thursdays               Durable Medical Equipment  (From admission, onward)           Start     Ordered   04/17/22 0850  For home use only DME oxygen  Once       Question Answer Comment  Length of Need 6 Months   Mode or (Route) Nasal cannula   Liters per Minute 5   Frequency Continuous (stationary and portable oxygen unit needed)   Oxygen conserving device Yes   Oxygen delivery system Gas      04/17/22 0849             Follow-up Information     [name], PA-C. Schedule an appointment as soon as possible for a visit in 1 week(s).   Specialty: Physician Assistant Contact information: [address] [place] [address] [phone]         Irine Seal, MD. Schedule an appointment as soon as possible for a visit in 1 week(s).   Specialty: Urology Contact information: [place] [address] [phone]  Major procedures and Radiology Reports  - PLEASE review detailed and final reports thoroughly  -       DG Chest Port 1 View  Result Date: 04/16/2022 CLINICAL DATA:  Shortness of breath. EXAM: PORTABLE CHEST 1 VIEW COMPARISON:  04/13/2022 FINDINGS: 0705 hours. Rightward patient rotation. The cardio pericardial silhouette is enlarged. Vascular congestion and diffuse interstitial opacity suggests edema. Similar appearance right base collapse/consolidation. Airspace opacity seen at the left base previously has improved in the interval. The visualized bony structures of the thorax are unremarkable. Telemetry leads overlie the chest. IMPRESSION: 1. Vascular congestion and diffuse interstitial opacity suggests edema. 2. Similar appearance right base collapse/consolidation with improving aeration at the left base. Airspace disease at the lung bases may be related to multifocal pneumonia or dependent edema. Electronically Signed   By: Misty Stanley M.D.   On: 04/16/2022 07:27   DG Chest Port 1 View  Result Date: 04/13/2022 CLINICAL DATA:  Shortness of breath EXAM: PORTABLE CHEST 1 VIEW COMPARISON:  Chest x-rays dated 04/12/2022 and 04/11/2022. FINDINGS: Cardiomediastinal contours are mostly obscured, but grossly stable in size. There are diffuse bilateral airspace opacities, RIGHT greater than LEFT, perhaps slightly increased on the RIGHT compared to yesterday's chest x-ray. No pleural effusion or pneumothorax is seen. IMPRESSION: 1. Diffuse bilateral airspace opacities, RIGHT greater than LEFT, perhaps slightly increased on the RIGHT compared to yesterday's chest x-ray, compatible with multifocal pneumonia versus pulmonary edema. 2. Uncertain heart size.  Probable cardiomegaly. Electronically Signed   By: Franki Cabot M.D.   On: 04/13/2022 08:08   DG Chest Port 1 View  Result Date: 04/12/2022 CLINICAL DATA:  Shortness of breath EXAM: PORTABLE CHEST 1 VIEW COMPARISON:  04/11/2022 FINDINGS: Patient is rotated. Stable cardiomediastinal contours. Low  lung volumes. Extensive patchy bilateral airspace opacities, not appreciably changed. Probable small right pleural effusion. No evidence of a pneumothorax. IMPRESSION: Extensive patchy bilateral airspace opacities, not appreciably changed. Probable small right pleural effusion. Electronically Signed   By: Davina Poke D.O.   On: 04/12/2022 09:57   DG Chest Port 1V same Day  Result Date: 04/11/2022 CLINICAL DATA:  Shortness of breath EXAM: PORTABLE CHEST 1 VIEW COMPARISON:  Chest radiograph dated 04/10/2022 FINDINGS: Patient is similarly rotated to the right. Low lung volumes. Markedly increased bilateral airspace opacities. No definite pleural effusion or pneumothorax. Cardiac borders are obscured. The visualized skeletal structures are unremarkable. IMPRESSION: Markedly increased bilateral airspace opacities, which may represent pulmonary edema or infection. Electronically Signed   By: Darrin Nipper M.D.   On: 04/11/2022 12:21   DG C-Arm 1-60 Min  Result Date: 04/11/2022 CLINICAL DATA:  Fluoro guidance provided EXAM: DG C-ARM 1-60 MIN FINDINGS: Dose: 107.3 mGy Fluoro time 100s IMPRESSION: C-arm fluoro guidance provided. Electronically Signed   By: Sammie Bench M.D.   On: 04/11/2022 08:47   CT Renal Stone Study  Result Date: 04/11/2022 CLINICAL DATA:  Severe right lower back pain for 3 days. Painful urination with dark urine and increased frequency. EXAM: CT ABDOMEN AND PELVIS WITHOUT CONTRAST TECHNIQUE: Multidetector CT imaging of the abdomen and pelvis was performed following the standard protocol without IV contrast. RADIATION DOSE REDUCTION: This exam was performed according to the departmental dose-optimization program which includes automated exposure control, adjustment of the mA and/or kV according to patient size and/or use of iterative reconstruction technique. COMPARISON:  01/02/2021 FINDINGS: Lower chest: No acute abnormality. Hepatobiliary: No focal liver abnormality is seen. Status post  cholecystectomy. No biliary dilatation. Pancreas: Unremarkable. No pancreatic ductal  dilatation or surrounding inflammatory changes. Spleen: Normal in size without focal abnormality. Adrenals/Urinary Tract: Normal adrenal glands bilateral cortical renal scarring greater on the right parenchymal calcifications versus nonobstructing calyceal stones in the upper pole of the right kidney. 1.0 cm stone in the right ureteropelvic junction with upstream moderate hydroureteronephrosis nephrosis nephrosis. Asymmetric stranding about the right kidney. The left kidney contains no urinary calculi. No left hydronephrosis. Unremarkable bladder. Stomach/Bowel: Colonic diverticulosis without diverticulitis. Normal caliber large and small bowel. Postoperative change of sleeve gastrectomy. Vascular/Lymphatic: Aortic atherosclerosis. No enlarged abdominal or pelvic lymph nodes. Reproductive: Uterus and bilateral adnexa are unremarkable. Other: No free intraperitoneal fluid or air Musculoskeletal: Nonspecific edema within the anterior abdominal wall pannus with skin thickening. Thoracolumbar spondylosis. IMPRESSION: Obstructing 1.0 cm stone in the right ureteropelvic junction with moderate right hydronephrosis. Nonspecific edema with the in the anterior abdominal wall pannus with skin thickening. Electronically Signed   By: Placido Sou M.D.   On: 04/11/2022 04:00   DG Chest 2 View  Result Date: 04/10/2022 CLINICAL DATA:  Shortness of breath. EXAM: CHEST - 2 VIEW COMPARISON:  June 13, 2020 FINDINGS: There is stable mild to moderate severity enlargement of the cardiac silhouette. Prominent pericardial fat is seen on the right. Mildly increased lung markings are seen with mild areas of atelectasis and/or infiltrate noted within the bilateral lung bases. There is no evidence of a pleural effusion or pneumothorax. Radiopaque surgical clips are seen within the right upper quadrant. Multilevel degenerative changes seen throughout the  thoracic spine. IMPRESSION: Mild bibasilar atelectasis and/or infiltrate. Electronically Signed   By: Virgina Norfolk M.D.   On: 04/10/2022 23:50    Micro Results    Recent Results (from the past 240 hour(s))  Urine Culture     Status: Abnormal   Collection Time: 04/11/22  4:35 AM   Specimen: Urine, Clean Catch  Result Value Ref Range Status   Specimen Description URINE, CLEAN CATCH  Final   Special Requests   Final    NONE Performed at [facility], [address], [place], [place] [address]    Culture >=100,000 COLONIES/mL ESCHERICHIA COLI (A)  Final   Report Status 04/13/2022 FINAL  Final   Organism ID, Bacteria ESCHERICHIA COLI (A)  Final      Susceptibility   Escherichia coli - MIC*    AMPICILLIN >=32 RESISTANT Resistant     CEFAZOLIN 8 SENSITIVE Sensitive     CEFEPIME <=0.12 SENSITIVE Sensitive     CEFTRIAXONE <=0.25 SENSITIVE Sensitive     CIPROFLOXACIN <=0.25 SENSITIVE Sensitive     GENTAMICIN <=1 SENSITIVE Sensitive     IMIPENEM <=0.25 SENSITIVE Sensitive     NITROFURANTOIN <=16 SENSITIVE Sensitive     TRIMETH/SULFA <=20 SENSITIVE Sensitive     AMPICILLIN/SULBACTAM >=32 RESISTANT Resistant     PIP/TAZO >=128 RESISTANT Resistant     * >=100,000 COLONIES/mL ESCHERICHIA COLI  Culture, blood (Routine X 2) w Reflex to ID Panel     Status: None   Collection Time: 04/11/22  1:11 PM   Specimen: BLOOD  Result Value Ref Range Status   Specimen Description BLOOD BLOOD RIGHT HAND  Final   Special Requests AEROBIC BOTTLE ONLY Blood Culture adequate volume  Final   Culture   Final    NO GROWTH 5 DAYS Performed at [facility], [address] [place], [place] [address]    Report Status 04/16/2022 FINAL  Final  Culture, blood (Routine X 2) w Reflex to ID Panel  Status: None   Collection Time: 04/11/22  1:11 PM   Specimen: BLOOD  Result Value Ref Range Status   Specimen Description BLOOD LEFT ANTECUBITAL  Final   Special Requests   Final    BOTTLES DRAWN  AEROBIC AND ANAEROBIC Blood Culture adequate volume   Culture   Final    NO GROWTH 5 DAYS Performed at [facility], [address] [place], [place] [address]    Report Status 04/16/2022 FINAL  Final  Resp panel by RT-PCR (RSV, Flu A&B, Covid) Anterior Nasal Swab     Status: None   Collection Time: 04/11/22  2:54 PM   Specimen: Anterior Nasal Swab  Result Value Ref Range Status   SARS Coronavirus 2 by RT PCR NEGATIVE NEGATIVE Final    Comment: (NOTE) SARS-CoV-2 target nucleic acids are NOT DETECTED.  The SARS-CoV-2 RNA is generally detectable in upper respiratory specimens during the acute phase of infection. The lowest concentration of SARS-CoV-2 viral copies this assay can detect is 138 copies/mL. A negative result does not preclude SARS-Cov-2 infection and should not be used as the sole basis for treatment or other patient management decisions. A negative result may occur with  improper specimen collection/handling, submission of specimen other than nasopharyngeal swab, presence of viral mutation(s) within the areas targeted by this assay, and inadequate number of viral copies(<138 copies/mL). A negative result must be combined with clinical observations, patient history, and epidemiological information. The expected result is Negative.  Fact Sheet for Patients:  EntrepreneurPulse.com.au  Fact Sheet for Healthcare Providers:  IncredibleEmployment.be  This test is no t yet approved or cleared by the [place] FDA and  has been authorized for detection and/or diagnosis of SARS-CoV-2 by FDA under an Emergency Use Authorization (EUA). This EUA will remain  in effect (meaning this test can be used) for the duration of the COVID-19 declaration under Section 564(b)(1) of the Act, 21 U.S.C.section 360bbb-3(b)(1), unless the authorization is terminated  or revoked sooner.       Influenza A by PCR NEGATIVE NEGATIVE Final   Influenza B  by PCR NEGATIVE NEGATIVE Final    Comment: (NOTE) The Xpert Xpress SARS-CoV-2/FLU/RSV plus assay is intended as an aid in the diagnosis of influenza from Nasopharyngeal swab specimens and should not be used as a sole basis for treatment. Nasal washings and aspirates are unacceptable for Xpert Xpress SARS-CoV-2/FLU/RSV testing.  Fact Sheet for Patients: EntrepreneurPulse.com.au  Fact Sheet for Healthcare Providers: IncredibleEmployment.be  This test is not yet approved or cleared by the [place] FDA and has been authorized for detection and/or diagnosis of SARS-CoV-2 by FDA under an Emergency Use Authorization (EUA). This EUA will remain in effect (meaning this test can be used) for the duration of the COVID-19 declaration under Section 564(b)(1) of the Act, 21 U.S.C. section 360bbb-3(b)(1), unless the authorization is terminated or revoked.     Resp Syncytial Virus by PCR NEGATIVE NEGATIVE Final    Comment: (NOTE) Fact Sheet for Patients: EntrepreneurPulse.com.au  Fact Sheet for Healthcare Providers: IncredibleEmployment.be  This test is not yet approved or cleared by the [place] FDA and has been authorized for detection and/or diagnosis of SARS-CoV-2 by FDA under an Emergency Use Authorization (EUA). This EUA will remain in effect (meaning this test can be used) for the duration of the COVID-19 declaration under Section 564(b)(1) of the Act, 21 U.S.C. section 360bbb-3(b)(1), unless the authorization is terminated or revoked.  Performed at [facility], [place] [address],  [place], [place] [address]     Today   Subjective    Tricia Perry today has no headache,no chest abdominal pain,no new weakness tingling or numbness, feels much better wants to go home today.    Objective   Blood pressure (!) 114/99, pulse 94, temperature (!) 97.5 F (36.4 C), temperature source Oral, resp. rate  20, height '5\' 4"'$  (1.626 m), weight (!) 170.9 kg, SpO2 93 %.   Intake/Output Summary (Last 24 hours) at 04/17/2022 0857 Last data filed at 04/17/2022 0548 Gross per 24 hour  Intake 960 ml  Output 1800 ml  Net -840 ml    Exam  Awake Alert, No new F.N deficits,    [place].AT,PERRAL Supple Neck,   Symmetrical Chest wall movement, Good air movement bilaterally, CTAB RRR,No Gallops,   +ve B.Sounds, Abd Soft, Non tender,  No Cyanosis, Clubbing or edema    Data Review   Recent Labs  Lab 04/13/22 0505 04/14/22 0424 04/15/22 0554 04/16/22 0749 04/17/22 0259  WBC 13.9* 9.8 7.7 8.2 10.3  HGB 12.0 11.8* 11.7* 12.9 12.7  HCT 37.9 38.2 38.0 41.8 40.6  PLT 245 240 257 276 321  MCV 96.9 97.4 97.7 97.0 96.0  MCH 30.7 30.1 30.1 29.9 30.0  MCHC 31.7 30.9 30.8 30.9 31.3  RDW 14.6 14.4 14.1 14.1 14.3  LYMPHSABS 1.6 1.3 1.4 2.0 2.1  MONOABS 1.2* 1.3* 0.9 0.9 1.0  EOSABS 0.1 0.2 0.3 0.4 0.5  BASOSABS 0.1 0.1 0.1 0.1 0.1    Recent Labs  Lab 04/10/22 2330 04/11/22 1003 04/11/22 1003 04/12/22 0156 04/13/22 0505 04/14/22 0424 04/15/22 0554 04/16/22 0749 04/17/22 0259  NA 131* 134*  --  137 136 137 138 139 138  K 4.0 4.4  --  4.4 3.8 3.7 3.8 3.7 3.9  CL 95* 97*  --  94* 94* 93* 93* 93* 90*  CO2 23 25  --  29 31 35* 36* 33* 36*  ANIONGAP 13 12  --  '14 11 9 9 13 12  '$ GLUCOSE 116* 155*  --  153* 137* 144* 134* 122* 142*  BUN 16 14  --  '17 19 16 12 11 18  '$ CREATININE 0.94 0.99  --  0.75 0.68 0.53 0.57 0.58 0.71  AST 49*  --   --   --   --   --   --   --   --   ALT 31  --   --   --   --   --   --   --   --   ALKPHOS 102  --   --   --   --   --   --   --   --   BILITOT 1.3*  --   --   --   --   --   --   --   --   ALBUMIN 4.2  --   --   --   --   --   --   --   --   CRP  --   --   --   --  19.6* 15.3* 10.9* 7.8*  --   PROCALCITON  --   --    < > 16.79 6.93 3.44 1.63 0.80 0.61  TSH  --   --   --   --   --  3.180  --   --   --   BNP  --   --   --  199.3* 74.4 48.7 59.2 26.7  --  MG  --   1.8  --   --  2.0 2.0 2.1 1.9  --   CALCIUM 9.3 8.8*  --  8.9 8.9 9.0 9.0 9.3 9.4   < > = values in this interval not displayed.     Total Time in preparing paper work, data evaluation and todays exam - 35 minutes  Signature  -    Lala Lund M.D on 04/17/2022 at 8:57 AM   -  To page go to [email or web address]

## 2022-04-17 NOTE — TOC Transition Note (Signed)
Transition of Care [facility]) - CM/SW Discharge Note   Patient Details  Name: Tricia Perry MRN: [id number] Date of Birth: [birth year]  Transition of Care [facility]) CM/SW Contact:  Cyndi Bender, RN Phone Number: 04/17/2022, 12:21 PM   Clinical Narrative:     Patient is stable for discharge.  Calvin with [facility] notified of discharge.  Darnelle Maffucci with [employer] notified of home 02 order change to 5L.  Husband will bring portable 02 tank from home.  No other TOC needs at this time.   Final next level of care: Home w Home Health Services Barriers to Discharge: Barriers Resolved   Patient Goals and CMS Choice CMS Medicare.gov Compare Post Acute Care list provided to:: Patient Choice offered to / list presented to : Patient  Discharge Placement                         Discharge Plan and Services Additional resources added to the After Visit Summary for     Discharge Planning Services: CM Consult Post Acute Care Choice: Home Health          DME Arranged: N/A         HH Arranged: PT, OT HH Agency: [facility] Date [place]: 04/15/22 Time [place]: 1128 Representative spoke with at [place]: [place] Determinants of Health ([place]) Interventions SDOH Screenings   Tobacco Use: Medium Risk (04/12/2022)     Readmission Risk Interventions    04/17/2022   12:18 PM  Readmission Risk Prevention Plan  Post Dischage Appt Complete  Medication Screening Complete  Transportation Screening Complete

## 2022-04-26 NOTE — Progress Notes (Signed)
COVID Vaccine received:  []  No [x]  Yes Date of any COVID positive Test in last 90 days:  PCP - Linward Natal PA-C  [facility] [place] (Work)  [phone] (Fax)   Cardiologist - Casandra Doffing, MD   Chest x-ray -04-16-2022 1v , 04-10-2022  2v EKG -  05-01-2020 epic   will repeat at PST Stress Test -  ECHO - 04-30-2020  epic Cardiac Cath -   PCR screen: []  Ordered & Completed                      []   No Order but Needs PROFEND                      [x]   N/A for this surgery  Surgery Plan:  [x]  Ambulatory                            []  Outpatient in bed                            []  Admit  Anesthesia:    [x]  General  []  Spinal                           []   Choice []   MAC  Pacemaker / ICD device [x]  No []  Yes        Device order form faxed [x]  No    []   Yes      Faxed to:  Spinal Cord Stimulator:[x]  No []  Yes      (Remind patient to bring remote DOS) Other Implants:   Respiratory Failure on continuous O2 History of Sleep Apnea? []  No []  Yes   CPAP used?- []  No []  Yes    Does the patient monitor blood sugar? []  No []  Yes  []  N/A  Patient has: [x]  Pre-DM   []  DM1  []   DM2 Does patient have a [employer] or Dex[employer]om? []  No []  Yes   Fasting Blood Sugar Ranges-  Checks Blood Sugar _____ times a day  Blood Thinner / Instructions:  none Aspirin Instructions:  none  ERAS Protocol Ordered: [x]  No  []  Yes Patient is to be NPO after: Midnight prior  Comments:   Activity level: Patient can / can not climb a flight of stairs without difficulty; []  No CP  []  No SOB, but would have ______   Patient can / can not perform ADLs without assistance.   Anesthesia review: HTN, OSA- CPAP?,  Respiratory failure, CHF, Hx PE 2015, "woke up during knee surgery", PONV, Pre-DM  Patient denies shortness of breath, fever, cough and chest pain at PAT appointment.  Patient verbalized understanding and agreement to the Pre-Surgical Instructions that were given to them at this PAT  appointment. Patient was also educated of the need to review these PAT instructions again prior to his/her surgery.I reviewed the appropriate phone numbers to call if they have any and questions or concerns.

## 2022-04-26 NOTE — Patient Instructions (Signed)
SURGICAL WAITING ROOM VISITATION Patients having surgery or a procedure may have no more than 2 support people in the waiting area - these visitors may rotate in the visitor waiting room.   Due to an increase in RSV and influenza rates and associated hospitalizations, children ages 74 and under may not visit patients in [place]. If the patient needs to stay at the hospital during part of their recovery, the visitor guidelines for inpatient rooms apply.  PRE-OP VISITATION  Pre-op nurse will coordinate an appropriate time for 1 support person to accompany the patient in pre-op.  This support person may not rotate.  This visitor will be contacted when the time is appropriate for the visitor to come back in the pre-op area.  Please refer to the [facility] website for the visitor guidelines for Inpatients (after your surgery is over and you are in a regular room).  You are not required to quarantine at this time prior to your surgery. However, you must do this: Hand Hygiene often Do NOT share personal items Notify your provider if you are in close contact with someone who has COVID or you develop fever 100.4 or greater, new onset of sneezing, cough, sore throat, shortness of breath or body aches.  If you test positive for Covid or have been in contact with anyone that has tested positive in the last 10 days please notify you surgeon.    Your procedure is scheduled on:  Friday   May 02, 2022  Report to [facility] Main Entrance: [address] entrance where the [employer] is available.   Report to admitting at: 10:45 AM  +++++Call this number if you have any questions or problems the morning of surgery [phone]  DO NOT EAT OR DRINK ANYTHING AFTER MIDNIGHT THE NIGHT PRIOR TO YOUR SURGERY / PROCEDURE.   FOLLOW BOWEL PREP AND ANY ADDITIONAL PRE OP INSTRUCTIONS YOU RECEIVED FROM YOUR SURGEON'S OFFICE!!!   Oral Hygiene is also important to reduce your risk of  infection.        Remember - BRUSH YOUR TEETH THE MORNING OF SURGERY WITH YOUR REGULAR TOOTHPASTE  Take ONLY these medicines the morning of surgery with A SIP OF WATER: levothyroxine (Synthroid), metoprolol, montelukast (Singulair). You may take oxybutynin (Ditropan) and Tylenol if needed. You may use your Albuterol if needed.   If You have been diagnosed with Sleep Apnea - Bring CPAP mask and tubing day of surgery. We will provide you with a CPAP machine on the day of your surgery.                   You may not have any metal on your body including hair pins, jewelry, and body piercing  Do not wear make-up, lotions, powders, perfumes  or deodorant  Do not wear nail polish including gel and S&S, artificial / acrylic nails, or any other type of covering on natural nails including finger and toenails. If you have artificial nails, gel coating, etc., that needs to be removed by a nail salon, Please have this removed prior to surgery. Not doing so may mean that your surgery could be cancelled or delayed if the Surgeon or anesthesia staff feels like they are unable to monitor you safely.   Do not shave 48 hours prior to surgery to avoid nicks in your skin which may contribute to postoperative infections.   Patients discharged on the day of surgery will not be allowed to drive home.  Someone NEEDS to  stay with you for the first 24 hours after anesthesia.  Do not bring your home medications to the hospital. The Pharmacy will dispense medications listed on your medication list to you during your admission in the Hospital.  Special Instructions: Bring a copy of your healthcare power of attorney and living will documents the day of surgery, if you wish to have them scanned into your [place] Medical Records- EPIC  Please read over the following fact sheets you were given: IF YOU HAVE QUESTIONS ABOUT YOUR PRE-OP INSTRUCTIONS, PLEASE CALL [phone]  ([place])   [place] - Preparing for  Surgery Before surgery, you can play an important role.  Because skin is not sterile, your skin needs to be as free of germs as possible.  You can reduce the number of germs on your skin by washing with CHG (chlorahexidine gluconate) soap before surgery.  CHG is an antiseptic cleaner which kills germs and bonds with the skin to continue killing germs even after washing. Please DO NOT use if you have an allergy to CHG or antibacterial soaps.  If your skin becomes reddened/irritated stop using the CHG and inform your nurse when you arrive at Short Stay. Do not shave (including legs and underarms) for at least 48 hours prior to the first CHG shower.  You may shave your face/neck.  Please follow these instructions carefully:  1.  Shower with CHG Soap the night before surgery and the  morning of surgery.  2.  If you choose to wash your hair, wash your hair first as usual with your normal  shampoo.  3.  After you shampoo, rinse your hair and body thoroughly to remove the shampoo.                             4.  Use CHG as you would any other liquid soap.  You can apply chg directly to the skin and wash.  Gently with a scrungie or clean washcloth.  5.  Apply the CHG Soap to your body ONLY FROM THE NECK DOWN.   Do not use on face/ open                           Wound or open sores. Avoid contact with eyes, ears mouth and genitals (private parts).                       Wash face,  Genitals (private parts) with your normal soap.             6.  Wash thoroughly, paying special attention to the area where your  surgery  will be performed.  7.  Thoroughly rinse your body with warm water from the neck down.  8.  DO NOT shower/wash with your normal soap after using and rinsing off the CHG Soap.            9.  Pat yourself dry with a clean towel.            10.  Wear clean pajamas.            11.  Place clean sheets on your bed the night of your first shower and do not  sleep with pets.  ON THE DAY OF SURGERY  : Do not apply any lotions/deodorants the morning of surgery.  Please wear clean clothes to the hospital/surgery center.  FAILURE TO FOLLOW THESE INSTRUCTIONS MAY RESULT IN THE CANCELLATION OF YOUR SURGERY  PATIENT SIGNATURE_________________________________  NURSE SIGNATURE__________________________________  ________________________________________________________________________

## 2022-04-28 ENCOUNTER — Encounter (HOSPITAL_COMMUNITY): Payer: Self-pay

## 2022-04-28 ENCOUNTER — Encounter (HOSPITAL_COMMUNITY)
Admission: RE | Admit: 2022-04-28 | Discharge: 2022-04-28 | Disposition: A | Discharge status: Home / Self Care | Payer: Medicare Other | Source: Ambulatory Visit | Attending: Urology | Admitting: Urology

## 2022-04-28 ENCOUNTER — Other Ambulatory Visit: Payer: Self-pay

## 2022-04-28 VITALS — BP 121/80 | HR 64 | Temp 98.1°F | Resp 24 | Ht 64.0 in | Wt 325.0 lb

## 2022-04-28 DIAGNOSIS — Z01818 Encounter for other preprocedural examination: Secondary | ICD-10-CM | POA: Diagnosis not present

## 2022-04-28 DIAGNOSIS — I10 Essential (primary) hypertension: Secondary | ICD-10-CM

## 2022-04-28 DIAGNOSIS — R7303 Prediabetes: Secondary | ICD-10-CM

## 2022-04-28 HISTORY — DX: Personal history of urinary calculi: Z87.442

## 2022-04-28 HISTORY — DX: Pneumonia, unspecified organism: J18.9

## 2022-04-28 LAB — GLUCOSE, CAPILLARY: Glucose-Capillary: 90 mg/dL (ref 70–99)

## 2022-04-28 LAB — HEMOGLOBIN A1C
Hgb A1c MFr Bld: 6.3 % — ABNORMAL HIGH (ref 4.8–5.6)
Mean Plasma Glucose: 134.11 mg/dL

## 2022-04-29 MED ORDER — SODIUM CHLORIDE (PF) 0.9 % IJ SOLN
INTRAMUSCULAR | Status: AC
  Filled 2022-04-29: qty 50

## 2022-05-02 ENCOUNTER — Encounter (HOSPITAL_COMMUNITY): Payer: Self-pay | Admitting: Urology

## 2022-05-02 ENCOUNTER — Encounter (HOSPITAL_COMMUNITY)
Admission: RE | Disposition: A | Discharge status: Home / Self Care | Payer: Self-pay | Source: Ambulatory Visit | Attending: Urology

## 2022-05-02 ENCOUNTER — Ambulatory Visit (HOSPITAL_COMMUNITY): Payer: Medicare Other | Admitting: Physician Assistant

## 2022-05-02 ENCOUNTER — Ambulatory Visit (HOSPITAL_BASED_OUTPATIENT_CLINIC_OR_DEPARTMENT_OTHER): Payer: Medicare Other | Admitting: Certified Registered Nurse Anesthetist

## 2022-05-02 ENCOUNTER — Ambulatory Visit (HOSPITAL_COMMUNITY): Payer: Medicare Other

## 2022-05-02 ENCOUNTER — Observation Stay (HOSPITAL_COMMUNITY)
Admission: RE | Admit: 2022-05-02 | Discharge: 2022-05-03 | Disposition: A | Discharge status: Home / Self Care | Payer: Medicare Other | Source: Ambulatory Visit | Attending: Urology | Admitting: Urology

## 2022-05-02 ENCOUNTER — Observation Stay (HOSPITAL_COMMUNITY): Payer: Medicare Other

## 2022-05-02 ENCOUNTER — Other Ambulatory Visit: Payer: Self-pay

## 2022-05-02 DIAGNOSIS — N201 Calculus of ureter: Secondary | ICD-10-CM | POA: Diagnosis not present

## 2022-05-02 DIAGNOSIS — Z86711 Personal history of pulmonary embolism: Secondary | ICD-10-CM | POA: Diagnosis not present

## 2022-05-02 DIAGNOSIS — E119 Type 2 diabetes mellitus without complications: Secondary | ICD-10-CM | POA: Insufficient documentation

## 2022-05-02 DIAGNOSIS — Z87891 Personal history of nicotine dependence: Secondary | ICD-10-CM | POA: Insufficient documentation

## 2022-05-02 DIAGNOSIS — E1151 Type 2 diabetes mellitus with diabetic peripheral angiopathy without gangrene: Secondary | ICD-10-CM

## 2022-05-02 DIAGNOSIS — Z7982 Long term (current) use of aspirin: Secondary | ICD-10-CM | POA: Diagnosis not present

## 2022-05-02 DIAGNOSIS — N202 Calculus of kidney with calculus of ureter: Secondary | ICD-10-CM | POA: Insufficient documentation

## 2022-05-02 DIAGNOSIS — E039 Hypothyroidism, unspecified: Secondary | ICD-10-CM | POA: Diagnosis not present

## 2022-05-02 DIAGNOSIS — I10 Essential (primary) hypertension: Secondary | ICD-10-CM | POA: Diagnosis not present

## 2022-05-02 DIAGNOSIS — Z79899 Other long term (current) drug therapy: Secondary | ICD-10-CM | POA: Insufficient documentation

## 2022-05-02 DIAGNOSIS — J45909 Unspecified asthma, uncomplicated: Secondary | ICD-10-CM | POA: Insufficient documentation

## 2022-05-02 DIAGNOSIS — N136 Pyonephrosis: Secondary | ICD-10-CM

## 2022-05-02 DIAGNOSIS — Z96651 Presence of right artificial knee joint: Secondary | ICD-10-CM | POA: Diagnosis not present

## 2022-05-02 DIAGNOSIS — R7303 Prediabetes: Secondary | ICD-10-CM

## 2022-05-02 DIAGNOSIS — N200 Calculus of kidney: Secondary | ICD-10-CM

## 2022-05-02 HISTORY — PX: CYSTOSCOPY/URETEROSCOPY/HOLMIUM LASER/STENT PLACEMENT: SHX6546

## 2022-05-02 LAB — GLUCOSE, CAPILLARY
Glucose-Capillary: 101 mg/dL — ABNORMAL HIGH (ref 70–99)
Glucose-Capillary: 132 mg/dL — ABNORMAL HIGH (ref 70–99)

## 2022-05-02 SURGERY — CYSTOSCOPY/URETEROSCOPY/HOLMIUM LASER/STENT PLACEMENT
Anesthesia: General | Laterality: Right

## 2022-05-02 MED ORDER — METOPROLOL TARTRATE 50 MG PO TABS
50.0000 mg | ORAL_TABLET | Freq: Two times a day (BID) | ORAL | Status: DC
  Administered 2022-05-02: 50 mg via ORAL
  Filled 2022-05-02: qty 1

## 2022-05-02 MED ORDER — LACTATED RINGERS IV SOLN: INTRAVENOUS | Status: DC

## 2022-05-02 MED ORDER — BISACODYL 10 MG RE SUPP: 10.0000 mg | Freq: Every day | RECTAL | Status: DC | PRN

## 2022-05-02 MED ORDER — OXYBUTYNIN CHLORIDE 5 MG PO TABS
5.0000 mg | ORAL_TABLET | Freq: Three times a day (TID) | ORAL | Status: DC | PRN
  Administered 2022-05-02: 5 mg via ORAL
  Filled 2022-05-02: qty 1

## 2022-05-02 MED ORDER — ACETAMINOPHEN 325 MG PO TABS: 650.0000 mg | ORAL_TABLET | ORAL | Status: DC | PRN

## 2022-05-02 MED ORDER — ONDANSETRON HCL 4 MG/2ML IJ SOLN
INTRAMUSCULAR | Status: AC
  Filled 2022-05-02: qty 2

## 2022-05-02 MED ORDER — LIDOCAINE HCL (PF) 2 % IJ SOLN
INTRAMUSCULAR | Status: AC
  Filled 2022-05-02: qty 5

## 2022-05-02 MED ORDER — SODIUM CHLORIDE 0.9 % IV SOLN
1.0000 g | INTRAVENOUS | Status: DC
  Filled 2022-05-02: qty 10

## 2022-05-02 MED ORDER — ROCURONIUM BROMIDE 10 MG/ML (PF) SYRINGE
PREFILLED_SYRINGE | INTRAVENOUS | Status: DC | PRN
  Administered 2022-05-02: 30 mg via INTRAVENOUS

## 2022-05-02 MED ORDER — FENTANYL CITRATE PF 50 MCG/ML IJ SOSY
25.0000 ug | PREFILLED_SYRINGE | INTRAMUSCULAR | Status: DC | PRN
  Administered 2022-05-02 (×2): 50 ug via INTRAVENOUS

## 2022-05-02 MED ORDER — MIDAZOLAM HCL 2 MG/2ML IJ SOLN
INTRAMUSCULAR | Status: AC
  Filled 2022-05-02: qty 2

## 2022-05-02 MED ORDER — ONDANSETRON HCL 4 MG/2ML IJ SOLN: 4.0000 mg | Freq: Once | INTRAMUSCULAR | Status: DC | PRN

## 2022-05-02 MED ORDER — ACETAMINOPHEN 500 MG PO TABS
1000.0000 mg | ORAL_TABLET | Freq: Once | ORAL | Status: AC
  Administered 2022-05-02: 1000 mg via ORAL
  Filled 2022-05-02: qty 2

## 2022-05-02 MED ORDER — SUGAMMADEX SODIUM 200 MG/2ML IV SOLN
INTRAVENOUS | Status: DC | PRN
  Administered 2022-05-02: 400 mg via INTRAVENOUS

## 2022-05-02 MED ORDER — MONTELUKAST SODIUM 10 MG PO TABS: 10.0000 mg | ORAL_TABLET | Freq: Every day | ORAL | Status: DC

## 2022-05-02 MED ORDER — LEVOTHYROXINE SODIUM 100 MCG PO TABS
200.0000 ug | ORAL_TABLET | Freq: Every day | ORAL | Status: DC
  Administered 2022-05-03: 200 ug via ORAL
  Filled 2022-05-02: qty 2

## 2022-05-02 MED ORDER — DEXAMETHASONE SODIUM PHOSPHATE 10 MG/ML IJ SOLN
INTRAMUSCULAR | Status: DC | PRN
  Administered 2022-05-02: 6 mg via INTRAVENOUS

## 2022-05-02 MED ORDER — FENTANYL CITRATE (PF) 100 MCG/2ML IJ SOLN
INTRAMUSCULAR | Status: DC | PRN
  Administered 2022-05-02: 50 ug via INTRAVENOUS

## 2022-05-02 MED ORDER — PROPOFOL 500 MG/50ML IV EMUL
INTRAVENOUS | Status: DC | PRN
  Administered 2022-05-02: 150 ug/kg/min via INTRAVENOUS

## 2022-05-02 MED ORDER — ALBUTEROL SULFATE (2.5 MG/3ML) 0.083% IN NEBU: 2.5000 mg | INHALATION_SOLUTION | Freq: Four times a day (QID) | RESPIRATORY_TRACT | Status: DC | PRN

## 2022-05-02 MED ORDER — IOHEXOL 300 MG/ML  SOLN
INTRAMUSCULAR | Status: DC | PRN
  Administered 2022-05-02: 4 mL

## 2022-05-02 MED ORDER — SODIUM CHLORIDE 0.9 % IV SOLN
2.0000 g | INTRAVENOUS | Status: AC
  Administered 2022-05-02: 2 g via INTRAVENOUS
  Filled 2022-05-02: qty 20

## 2022-05-02 MED ORDER — PROPOFOL 10 MG/ML IV BOLUS
INTRAVENOUS | Status: AC
  Filled 2022-05-02: qty 20

## 2022-05-02 MED ORDER — PROPOFOL 10 MG/ML IV BOLUS
INTRAVENOUS | Status: DC | PRN
  Administered 2022-05-02: 160 mg via INTRAVENOUS

## 2022-05-02 MED ORDER — SODIUM CHLORIDE 0.9 % IR SOLN
Status: DC | PRN
  Administered 2022-05-02: 3000 mL via INTRAVESICAL

## 2022-05-02 MED ORDER — PROPOFOL 1000 MG/100ML IV EMUL
INTRAVENOUS | Status: AC
  Filled 2022-05-02: qty 100

## 2022-05-02 MED ORDER — ALBUTEROL SULFATE HFA 108 (90 BASE) MCG/ACT IN AERS
INHALATION_SPRAY | RESPIRATORY_TRACT | Status: DC | PRN
  Administered 2022-05-02: 2 via RESPIRATORY_TRACT

## 2022-05-02 MED ORDER — FENTANYL CITRATE PF 50 MCG/ML IJ SOSY
PREFILLED_SYRINGE | INTRAMUSCULAR | Status: AC
  Filled 2022-05-02: qty 2

## 2022-05-02 MED ORDER — FUROSEMIDE 40 MG PO TABS
40.0000 mg | ORAL_TABLET | Freq: Two times a day (BID) | ORAL | Status: DC
  Administered 2022-05-02: 40 mg via ORAL
  Filled 2022-05-02: qty 1

## 2022-05-02 MED ORDER — ALBUTEROL SULFATE HFA 108 (90 BASE) MCG/ACT IN AERS
INHALATION_SPRAY | RESPIRATORY_TRACT | Status: AC
  Filled 2022-05-02: qty 6.7

## 2022-05-02 MED ORDER — HYDROMORPHONE HCL 1 MG/ML IJ SOLN: 0.5000 mg | INTRAMUSCULAR | Status: DC | PRN

## 2022-05-02 MED ORDER — PHENYLEPHRINE 80 MCG/ML (10ML) SYRINGE FOR IV PUSH (FOR BLOOD PRESSURE SUPPORT)
PREFILLED_SYRINGE | INTRAVENOUS | Status: DC | PRN
  Administered 2022-05-02: 160 ug via INTRAVENOUS

## 2022-05-02 MED ORDER — ONDANSETRON HCL 4 MG/2ML IJ SOLN
INTRAMUSCULAR | Status: DC | PRN
  Administered 2022-05-02: 4 mg via INTRAVENOUS

## 2022-05-02 MED ORDER — LISINOPRIL 10 MG PO TABS: 10.0000 mg | ORAL_TABLET | Freq: Every day | ORAL | Status: DC

## 2022-05-02 MED ORDER — FLUCONAZOLE IN SODIUM CHLORIDE 200-0.9 MG/100ML-% IV SOLN
200.0000 mg | Freq: Every day | INTRAVENOUS | Status: DC
  Filled 2022-05-02: qty 100

## 2022-05-02 MED ORDER — SUCCINYLCHOLINE CHLORIDE 200 MG/10ML IV SOSY
PREFILLED_SYRINGE | INTRAVENOUS | Status: DC | PRN
  Administered 2022-05-02: 200 mg via INTRAVENOUS

## 2022-05-02 MED ORDER — ONDANSETRON HCL 4 MG/2ML IJ SOLN: 4.0000 mg | INTRAMUSCULAR | Status: DC | PRN

## 2022-05-02 MED ORDER — POTASSIUM CHLORIDE IN NACL 20-0.45 MEQ/L-% IV SOLN
INTRAVENOUS | Status: DC
  Filled 2022-05-02 (×2): qty 1000

## 2022-05-02 MED ORDER — SENNOSIDES-DOCUSATE SODIUM 8.6-50 MG PO TABS: 1.0000 | ORAL_TABLET | Freq: Every evening | ORAL | Status: DC | PRN

## 2022-05-02 MED ORDER — ORAL CARE MOUTH RINSE: 15.0000 mL | Freq: Once | OROMUCOSAL | Status: AC

## 2022-05-02 MED ORDER — LIDOCAINE 2% (20 MG/ML) 5 ML SYRINGE
INTRAMUSCULAR | Status: DC | PRN
  Administered 2022-05-02: 100 mg via INTRAVENOUS

## 2022-05-02 MED ORDER — OXYCODONE HCL 5 MG PO TABS: 5.0000 mg | ORAL_TABLET | ORAL | Status: DC | PRN

## 2022-05-02 MED ORDER — CHLORHEXIDINE GLUCONATE 0.12 % MT SOLN
15.0000 mL | Freq: Once | OROMUCOSAL | Status: AC
  Administered 2022-05-02: 15 mL via OROMUCOSAL

## 2022-05-02 MED ORDER — DEXAMETHASONE SODIUM PHOSPHATE 10 MG/ML IJ SOLN
INTRAMUSCULAR | Status: AC
  Filled 2022-05-02: qty 1

## 2022-05-02 MED ORDER — FLUCONAZOLE IN SODIUM CHLORIDE 200-0.9 MG/100ML-% IV SOLN
200.0000 mg | INTRAVENOUS | Status: DC
  Filled 2022-05-02: qty 100

## 2022-05-02 MED ORDER — FLEET ENEMA 7-19 GM/118ML RE ENEM: 1.0000 | ENEMA | Freq: Once | RECTAL | Status: DC | PRN

## 2022-05-02 MED ORDER — FLUCONAZOLE IN SODIUM CHLORIDE 200-0.9 MG/100ML-% IV SOLN
200.0000 mg | Freq: Once | INTRAVENOUS | Status: AC
  Administered 2022-05-02: 200 mg via INTRAVENOUS
  Filled 2022-05-02: qty 100

## 2022-05-02 MED ORDER — ASPIRIN 81 MG PO TBEC
81.0000 mg | DELAYED_RELEASE_TABLET | Freq: Every day | ORAL | Status: DC
  Administered 2022-05-02: 81 mg via ORAL
  Filled 2022-05-02: qty 1

## 2022-05-02 MED ORDER — FENTANYL CITRATE (PF) 100 MCG/2ML IJ SOLN
INTRAMUSCULAR | Status: AC
  Filled 2022-05-02: qty 2

## 2022-05-02 SURGICAL SUPPLY — 22 items
BAG URO CATCHER STRL LF (MISCELLANEOUS) ×2 IMPLANT
BASKET STONE NCOMPASS (UROLOGICAL SUPPLIES) IMPLANT
CATH URETERAL DUAL LUMEN 10F (MISCELLANEOUS) IMPLANT
CATH URETL OPEN 5X70 (CATHETERS) IMPLANT
CLOTH BEACON ORANGE TIMEOUT ST (SAFETY) ×2 IMPLANT
EXTRACTOR STONE NITINOL NGAGE (UROLOGICAL SUPPLIES) IMPLANT
GLOVE SURG SS PI 8.0 STRL IVOR (GLOVE) ×2 IMPLANT
GOWN STRL REUS W/ TWL XL LVL3 (GOWN DISPOSABLE) ×2 IMPLANT
GOWN STRL REUS W/TWL XL LVL3 (GOWN DISPOSABLE) ×1
GUIDEWIRE STR DUAL SENSOR (WIRE) ×2 IMPLANT
IV NS IRRIG 3000ML ARTHROMATIC (IV SOLUTION) ×2 IMPLANT
KIT TURNOVER KIT A (KITS) IMPLANT
LASER FIB FLEXIVA PULSE ID 365 (Laser) IMPLANT
LASER FIB FLEXIVA PULSE ID 550 (Laser) IMPLANT
LASER FIB FLEXIVA PULSE ID 910 (Laser) IMPLANT
MANIFOLD NEPTUNE II (INSTRUMENTS) ×2 IMPLANT
PACK CYSTO (CUSTOM PROCEDURE TRAY) ×2 IMPLANT
SHEATH NAVIGATOR HD 11/13X36 (SHEATH) IMPLANT
TRACTIP FLEXIVA PULS ID 200XHI (Laser) IMPLANT
TRACTIP FLEXIVA PULSE ID 200 (Laser) ×1
TUBING CONNECTING 10 (TUBING) ×2 IMPLANT
TUBING UROLOGY SET (TUBING) ×2 IMPLANT

## 2022-05-02 NOTE — Progress Notes (Signed)
Rn messaged at [phone] about patients CT scan. No response.

## 2022-05-02 NOTE — Transfer of Care (Signed)
Immediate Anesthesia Transfer of Care Note  Patient: Tricia Perry  Procedure(s) Performed: CYSTOSCOPY/ RIGHT URETEROSCOPY/STONE BASKET RETRIVAL/STENT EXCHANGE (Right)  Patient Location: PACU  Anesthesia Type:General  Level of Consciousness: awake, alert , and patient cooperative  Airway & Oxygen Therapy: Patient Spontanous Breathing and Patient connected to face mask oxygen  Post-op Assessment: Report given to RN and Post -op Vital signs reviewed and stable  Post vital signs: Reviewed and stable  Last Vitals:  Vitals Value Taken Time  BP 106/89 05/02/22 1420  Temp    Pulse 65 05/02/22 1423  Resp 17 05/02/22 1423  SpO2 98 % 05/02/22 1423  Vitals shown include unvalidated device data.  Last Pain:  Vitals:   05/02/22 1126  TempSrc:   PainSc: 6       Patients Stated Pain Goal: 3 (94/17/40 8144)  Complications: No notable events documented.

## 2022-05-02 NOTE — Op Note (Signed)
Procedure: 1.  Cystoscopy with removal and replacement of right double-J stent. 2.  Ureteroscopy with stone extraction. 3.  Application of fluoroscopy.  Preop diagnosis: 1 cm right UPJ stone.  Postop diagnosis: Same with pyonephrosis.  Surgeon: Dr. Irine Seal.  Anesthesia: General.  Specimen: Stone fragments.  Drain: 6 [place] by 26 cm right contour double-J stent.  EBL: None.  Complications: None.  Indications: The patient is  a 67 year old female who had who was hospitalized on 04/11/2022 with a 1 cm right UPJ stone with infection.  She was stented and treated for the infection and returns now for stone management.  Procedure: She was taken the operating room where she was given Rocephin.  A general anesthetic was induced and she was placed in the lithotomy position.  PAS hose were placed.  Her perineum and genitalia were prepped with Betadine solution she was draped in usual sterile fashion.  Cystoscopy was performed with a 21 [place] scope and 30 degree lens.  Examination revealed a normal urethra.  The bladder wall was erythematous from irritation and there was some flocculent debris within the bladder.  The left ureteral orifice was unremarkable.  The right ureteral orifice harbored a stent loop with some flocculent coating.  The stent was grasped and pulled to the urethral meatus.  A sensor wire was then advanced to the kidney under fluoroscopic guidance.  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  There was a drip of urine confirming placement in the collecting system but I injected some Omnipaque to ensure I was not still in the ureter.  The retrograde pyelogram demonstrated a dilated right collecting system without obvious filling defect.  The sensor wire was then advanced into the collecting system which was now readily visualized with the contrast.  The cystoscope was removed and the 11/13 36 cm access sheath  was advanced over the wire to the kidney and the inner core and wire were removed.  Upon removal of the inner core and wire, there was drainage initially of clear fluid followed by some turbid slightly bloody fluid.  The dual-lumen digital flexible ureteroscope was then advanced through the sheath and there were some stone fragments noted right at the UPJ which were small enough to removed with the engage basket but did not appear to be large enough to be the 1 cm stone.  I then inspected the calyces with gentle irrigation as best I could but the urine was somewhat cloudy with some purulent debris and a few calyces so it was rather difficult to visualize.  I did see a few small fragments in the lower calyx which were grasped with a grasping forceps but they were too small to readily removed.  At this point I asked anesthesia to add 200 mg of fluconazole out of concern for possible post antibiotic yeast overgrowth.  I felt at this time that further irrigation and manipulation would be unsafe in the presence of possible infection so I remove the ureteroscope.  The sensor wire was then reinserted to the kidney and the sheath was removed.  The cystoscope was reinserted over the wire and a fresh 6 [place] by 26 cm contour double-J stent was advanced the kidney under fluoroscopic guidance, the wire was removed, leaving a good coil in the kidney and a good coil in the bladder.  The bladder was then drained into a collection cup to send for culture.  The scope was removed  and she was taken down from lithotomy position, her anesthetic was reversed and she was moved to recovery in stable condition.  There were no complications.

## 2022-05-02 NOTE — Interval H&P Note (Signed)
History and Physical Interval Note:  For the ureteroscopy today.   05/02/2022 12:30 PM  Emberleigh Jerline Pain  has presented today for surgery, with the diagnosis of RIGHT URETERAL PELVIC JUNCTION STONE.  The various methods of treatment have been discussed with the patient and family. After consideration of risks, benefits and other options for treatment, the patient has consented to  Procedure(s) with comments: CYSTOSCOPY RIGHT URETEROSCOPY/HOLMIUM LASER/STENT EXCHANGE (Right) - 1 HR FOR CASE as a surgical intervention.  The patient's history has been reviewed, patient examined, no change in status, stable for surgery.  I have reviewed the patient's chart and labs.  Questions were answered to the patient's satisfaction.     Irine Seal

## 2022-05-02 NOTE — Anesthesia Preprocedure Evaluation (Signed)
Anesthesia Evaluation  Patient identified by MRN, date of birth, ID band Patient awake    Reviewed: Allergy & Precautions, NPO status , Patient's Chart, lab work & pertinent test results, reviewed documented beta blocker date and time   History of Anesthesia Complications (+) PONV and history of anesthetic complications  Airway Mallampati: II  TM Distance: >3 FB Neck ROM: Full    Dental  (+) Teeth Intact, Dental Advisory Given   Pulmonary asthma , sleep apnea , former smoker   Pulmonary exam normal breath sounds clear to auscultation       Cardiovascular hypertension, Pt. on medications and Pt. on home beta blockers + Peripheral Vascular Disease  Normal cardiovascular exam Rhythm:Regular Rate:Normal     Neuro/Psych  Neuromuscular disease  negative psych ROS   GI/Hepatic Neg liver ROS,GERD  ,,  Endo/Other  diabetes, Type 2Hypothyroidism  Morbid obesity  Renal/GU Renal disease Bladder dysfunction      Musculoskeletal  (+) Arthritis ,  Fibromyalgia -  Abdominal   Peds  Hematology negative hematology ROS (+)   Anesthesia Other Findings Day of surgery medications reviewed with the patient.  Reproductive/Obstetrics                             Anesthesia Physical Anesthesia Plan  ASA: 4  Anesthesia Plan: General   Post-op Pain Management: Tylenol PO (pre-op)*   Induction: Intravenous  PONV Risk Score and Plan: 4 or greater and TIVA, Dexamethasone and Ondansetron  Airway Management Planned: Oral ETT  Additional Equipment:   Intra-op Plan:   Post-operative Plan: Extubation in OR  Informed Consent: I have reviewed the patients History and Physical, chart, labs and discussed the procedure including the risks, benefits and alternatives for the proposed anesthesia with the patient or authorized representative who has indicated his/her understanding and acceptance.     Dental  advisory given  Plan Discussed with: CRNA  Anesthesia Plan Comments:        Anesthesia Quick Evaluation

## 2022-05-02 NOTE — Anesthesia Postprocedure Evaluation (Signed)
Anesthesia Post Note  Patient: Tricia Perry  Procedure(s) Performed: CYSTOSCOPY/ RIGHT URETEROSCOPY/STONE BASKET RETRIVAL/STENT EXCHANGE (Right)     Patient location during evaluation: PACU Anesthesia Type: General Level of consciousness: awake and alert Pain management: pain level controlled Vital Signs Assessment: post-procedure vital signs reviewed and stable Respiratory status: spontaneous breathing, nonlabored ventilation, respiratory function stable and patient connected to nasal cannula oxygen Cardiovascular status: blood pressure returned to baseline and stable Postop Assessment: no apparent nausea or vomiting Anesthetic complications: no   No notable events documented.  Last Vitals:  Vitals:   05/02/22 1445 05/02/22 1515  BP: (!) 105/93 139/85  Pulse: (!) 58 65  Resp: 15 (!) 21  Temp:    SpO2: 92% 92%    Last Pain:  Vitals:   05/02/22 1440  TempSrc:   PainSc: [name]

## 2022-05-02 NOTE — Anesthesia Procedure Notes (Signed)
Procedure Name: Intubation Date/Time: 05/02/2022 1:16 PM  Performed by: West Pugh, CRNAPre-anesthesia Checklist: Patient identified, Emergency Drugs available, Suction available, Patient being monitored and Timeout performed Patient Re-evaluated:Patient Re-evaluated prior to induction Oxygen Delivery Method: Circle system utilized Preoxygenation: Pre-oxygenation with 100% oxygen Induction Type: IV induction and Cricoid Pressure applied Ventilation: Mask ventilation without difficulty Laryngoscope Size: Mac and 3 Grade View: Grade I Tube type: Oral Tube size: 7.0 mm Number of attempts: 1 Airway Equipment and Method: Stylet Placement Confirmation: ETT inserted through vocal cords under direct vision, positive ETCO2, CO2 detector and breath sounds checked- equal and bilateral Secured at: 22 cm Tube secured with: Tape Dental Injury: Teeth and Oropharynx as per pre-operative assessment

## 2022-05-03 ENCOUNTER — Encounter (HOSPITAL_COMMUNITY): Payer: Self-pay | Admitting: Urology

## 2022-05-03 DIAGNOSIS — N136 Pyonephrosis: Secondary | ICD-10-CM | POA: Diagnosis not present

## 2022-05-03 LAB — GLUCOSE, CAPILLARY: Glucose-Capillary: 136 mg/dL — ABNORMAL HIGH (ref 70–99)

## 2022-05-03 LAB — URINE CULTURE: Culture: 20000 — AB

## 2022-05-03 MED ORDER — FLUCONAZOLE 200 MG PO TABS: 200.0000 mg | ORAL_TABLET | Freq: Every day | ORAL | 0 refills | Status: AC

## 2022-05-03 MED ORDER — SOLIFENACIN SUCCINATE 5 MG PO TABS: 5.0000 mg | ORAL_TABLET | Freq: Every day | ORAL | 0 refills | Status: AC

## 2022-05-03 MED ORDER — CEFDINIR 300 MG PO CAPS: 300.0000 mg | ORAL_CAPSULE | Freq: Two times a day (BID) | ORAL | 0 refills | Status: AC

## 2022-05-03 NOTE — Progress Notes (Signed)
Patient and spouse given discharge, follow up, and medication instructions, verbalized understanding, IV and purewick removed, personal belongings with patient, preferred to take morning meds when she gets home DT: diuretic, spouse to transport home

## 2022-05-03 NOTE — Discharge Summary (Signed)
[facility] Discharge Summary  Admit date: 05/02/2022  Discharge date and time: 05/03/22   Discharge to: Home  Discharge Service: Urology  Discharge Attending Physician:  Dr. Jeffie Pollock  Discharge  Diagnoses: Right renal stone  Secondary Diagnosis: Principal Problem:   Right renal stone   OR Procedures: Procedure(s): CYSTOSCOPY/ RIGHT URETEROSCOPY/STONE BASKET RETRIVAL/STENT EXCHANGE 05/02/2022   Ancillary Procedures: None   Discharge Day Services: The patient was seen and examined by the Urology team both in the morning and immediately prior to discharge.  Vital signs and laboratory values were stable and within normal limits.  The physical exam was benign and unchanged and all surgical wounds were examined.  Discharge instructions were explained and all questions answered.  Subjective  No acute events overnight. Pain Controlled. No fever or chills.  Objective Patient Vitals for the past 8 hrs:  BP Temp Temp src Pulse Resp SpO2  05/03/22 0428 129/76 98.3 F (36.8 C) Oral 83 20 94 %   No intake/output data recorded.  General Appearance:        No acute distress Lungs:                       Normal work of breathing on room air Heart:                                Regular rate and rhythm Abdomen:                         Soft, non-tender, non-distended Extremities:                      Warm and well perfused   Hospital Course:  The patient underwent right ureteroscopy on 05/02/2022. Intra-op findings were notable for purulent appearing urine. A stent was replaced  The patient tolerated the procedure well, was extubated in the OR, and afterwards was taken to the PACU for routine post-surgical care. When stable the patient was transferred to the floor.   The patient did well postoperatively. CT POD0 demonstrated small residual stones in the renal pelvis The patient's diet was slowly advanced and at the time of discharge was tolerating a regular diet.  The patient was discharged  home 1 Day Post-Op, at which point was tolerating a regular solid diet, was able to void spontaneously, have adequate pain control with P.O. pain medication, and could ambulate without difficulty.   - she will follow-up in 5-10 days for office appt for check - 7 days antibiotics based on prior cultures, omnicef and fluc based on priors - discussed with patient she will likely need 2nd look to clear out residual stones, continue stent  Condition at Discharge: Improved  Discharge Medications:  Allergies as of 05/03/2022       Reactions   Gabapentin Swelling   Legs    Mold Extract [trichophyton] Nausea And Vomiting, Other (See Comments)   Sneezing real bad    Morphine Itching, Other (See Comments)   back hurts   Statins Other (See Comments)   Muscle pain   Codeine Itching, Other (See Comments)   Back hurts        Medication List     TAKE these medications    acetaminophen 325 MG tablet Commonly known as: TYLENOL Take 650 mg by mouth every 6 (six) hours as needed for mild pain.   albuterol 108 (90  Base) MCG/ACT inhaler Commonly known as: VENTOLIN HFA Inhale 2 puffs into the lungs every 6 (six) hours as needed for wheezing or shortness of breath.   albuterol (2.5 MG/3ML) 0.083% nebulizer solution Commonly known as: PROVENTIL Take 3 mLs (2.5 mg total) by nebulization every 6 (six) hours as needed for wheezing or shortness of breath.   aspirin EC 81 MG tablet Take 81 mg by mouth daily. Swallow whole.   AZO Cranberry 250-30 MG Tabs Take 2 tablets by mouth daily as needed (pain).   cefdinir 300 MG capsule Commonly known as: OMNICEF Take 1 capsule (300 mg total) by mouth 2 (two) times daily for 7 days.   fluconazole 200 MG tablet Commonly known as: Diflucan Take 1 tablet (200 mg total) by mouth daily for 7 days.   furosemide 40 MG tablet Commonly known as: Lasix Take 1 tablet (40 mg total) by mouth 2 (two) times daily.   levothyroxine 200 MCG tablet Commonly known  as: SYNTHROID TAKE ONE TABLET BY MOUTH ONE TIME DAILY before breakfast   lisinopril 10 MG tablet Commonly known as: ZESTRIL Take 1 tablet (10 mg total) by mouth daily.   MAGNESIUM-ZINC PO Take 1 tablet by mouth daily.   metoprolol tartrate 50 MG tablet Commonly known as: LOPRESSOR Take 1 tablet (50 mg total) by mouth 2 (two) times daily.   montelukast 10 MG tablet Commonly known as: SINGULAIR Take 10 mg by mouth in the morning.   multivitamin capsule Take 1 capsule by mouth daily.   NON FORMULARY Pt uses a cpap nightly   oxybutynin 5 MG tablet Commonly known as: DITROPAN Take 5 mg by mouth every 8 (eight) hours as needed for bladder spasms.   OXYGEN Inhale 2.5 L into the lungs continuous.   potassium chloride SA 20 MEQ tablet Commonly known as: KLOR-CON M Take 2 tablets (40 mEq total) by mouth daily.   Vitamin D (Ergocalciferol) 1.25 MG (50000 UNIT) Caps capsule Commonly known as: DRISDOL Take 50,000 Units by mouth once a week. Take on Thursdays

## 2022-05-03 NOTE — Progress Notes (Signed)
PT placed set up and placed on cpap for the night.

## 2022-05-03 NOTE — Plan of Care (Signed)
  Problem: Education: Goal: Knowledge of General Education information will improve Description: Including pain rating scale, medication(s)/side effects and non-pharmacologic comfort measures Outcome: Adequate for Discharge   Problem: Health Behavior/Discharge Planning: Goal: Ability to manage health-related needs will improve Outcome: Adequate for Discharge   Problem: Clinical Measurements: Goal: Ability to maintain clinical measurements within normal limits will improve Outcome: Adequate for Discharge Goal: Will remain free from infection Outcome: Adequate for Discharge Goal: Diagnostic test results will improve Outcome: Adequate for Discharge Goal: Respiratory complications will improve Outcome: Adequate for Discharge Goal: Cardiovascular complication will be avoided Outcome: Adequate for Discharge   Problem: Activity: Goal: Risk for activity intolerance will decrease Outcome: Adequate for Discharge   Problem: Nutrition: Goal: Adequate nutrition will be maintained Outcome: Adequate for Discharge   Problem: Coping: Goal: Level of anxiety will decrease Outcome: Adequate for Discharge   Problem: Elimination: Goal: Will not experience complications related to bowel motility Outcome: Adequate for Discharge Goal: Will not experience complications related to urinary retention Outcome: Adequate for Discharge   Problem: Pain Managment: Goal: General experience of comfort will improve Outcome: Adequate for Discharge   Problem: Safety: Goal: Ability to remain free from injury will improve Outcome: Adequate for Discharge   Problem: Skin Integrity: Goal: Risk for impaired skin integrity will decrease Outcome: Adequate for Discharge   Problem: Education: Goal: Required Educational Video(s) Outcome: Adequate for Discharge   Problem: Clinical Measurements: Goal: Ability to maintain clinical measurements within normal limits will improve Outcome: Adequate for  Discharge Goal: Postoperative complications will be avoided or minimized Outcome: Adequate for Discharge   Problem: Skin Integrity: Goal: Demonstration of wound healing without infection will improve Outcome: Adequate for Discharge

## 2022-05-03 NOTE — Progress Notes (Signed)
  Transition of Care [facility]) Screening Note   Patient Details  Name: Tricia Perry Date of Birth: February 15, 1956   Transition of Care [facility]) CM/SW Contact:    Henrietta Dine, RN Phone Number: 05/03/2022, 9:26 AM    Transition of Care Department [facility]) has reviewed patient and no TOC needs have been identified at this time. We will continue to monitor patient advancement through interdisciplinary progression rounds. If new patient transition needs arise, please place a TOC consult.

## 2022-05-03 NOTE — Discharge Instructions (Signed)
You may see some blood in the urine and may have some burning with urination for 48-72 hours. You also may notice that you have to urinate more frequently or urgently after your procedure which is normal.  You should call should you develop an inability urinate, fever > 101, persistent nausea and vomiting that prevents you from eating or drinking to stay hydrated.   You have a stent in place, which will need to remain in place until your next procedure Please take 1 week of the two antibiotics as directed, fluconazole and omnicef  If you have a stent, you will likely urinate more frequently and urgently until the stent is removed and you may experience some discomfort/pain in the lower abdomen and flank especially when urinating. You may take pain medication prescribed to you if needed for pain. You may also intermittently have blood in the urine until the stent is removed.  If you have any issues, you should call the office [phone]) to notify us.

## 2022-05-12 ENCOUNTER — Other Ambulatory Visit: Payer: Self-pay | Admitting: Urology

## 2022-05-12 LAB — CALCULI, WITH PHOTOGRAPH (CLINICAL LAB)
Calcium Oxalate Monohydrate: 60 %
Hydroxyapatite: 40 %
Weight Calculi: 13 mg

## 2022-05-15 ENCOUNTER — Other Ambulatory Visit: Payer: Self-pay | Admitting: Urology

## 2022-05-20 ENCOUNTER — Encounter (HOSPITAL_COMMUNITY): Payer: Self-pay

## 2022-05-20 NOTE — Progress Notes (Addendum)
PCP - Willene Hatchet, NP Cardiologist - LOV 08-21-20 Evelena Peat epic Pulm-09-24-21 LOV Dr. Darlis Loan ellison  PPM/ICD -  Device Orders -  Rep Notified -   Chest x-ray - 1 v 04-16-22 epic EKG - 04-08-22 epic Stress Test -  ECHO -  Cardiac Cath -  HgbA1c- 04-28-22 epic 6.3  Sleep Study -  CPAP -   Fasting Blood Sugar -  Checks Blood Sugar _____ times a day  Blood Thinner Instructions: Aspirin Instructions:81 mg hold 5 days  ERAS Protcol - PRE-SURGERY   COVID vaccine -  Activity--Limited wears o2  Anesthesia review: chronic resp. Failure, OSA, HTN, PE, 2.5L o2 continous,  Chronic Diastolic dysfunction, See urine culture  Patient denies shortness of breath, fever, cough and chest pain at PAT appointment   All instructions explained to the patient, with a verbal understanding of the material. Patient agrees to go over the instructions while at home for a better understanding. Patient also instructed to self quarantine after being tested for COVID-19. The opportunity to ask questions was provided.

## 2022-05-20 NOTE — Patient Instructions (Signed)
SURGICAL WAITING ROOM VISITATION  Patients having surgery or a procedure may have no more than 2 support people in the waiting area - these visitors may rotate.    Children under the age of 3 must have an adult with them who is not the patient.  Due to an increase in RSV and influenza rates and associated hospitalizations, children ages 28 and under may not visit patients in [place].  If the patient needs to stay at the hospital during part of their recovery, the visitor guidelines for inpatient rooms apply. Pre-op nurse will coordinate an appropriate time for 1 support person to accompany patient in pre-op.  This support person may not rotate.    Please refer to the [facility] website for the visitor guidelines for Inpatients (after your surgery is over and you are in a regular room).       Your procedure is scheduled on: 05-30-22    Report to [facility] Main Entrance    Report to admitting at       [place]   AM   Call this number if you have problems the morning of surgery [phone]   Do not eat food or drink liquids :After Midnight.              If you have questions, please contact your surgeon's office.   FOLLOW ANY ADDITIONAL PRE OP INSTRUCTIONS YOU RECEIVED FROM YOUR SURGEON'S OFFICE!!!     Oral Hygiene is also important to reduce your risk of infection.                                    Remember - BRUSH YOUR TEETH THE MORNING OF SURGERY WITH YOUR REGULAR TOOTHPASTE  DENTURES WILL BE REMOVED PRIOR TO SURGERY PLEASE DO NOT APPLY "Poly grip" OR ADHESIVES!!!   Do NOT smoke after Midnight   Take these medicines the morning of surgery with A SIP OF WATER: metoprolol, oxybutin if needed, levothyroxine, bring rescue inhaler with you  DO NOT TAKE ANY ORAL DIABETIC MEDICATIONS DAY OF YOUR SURGERY  Bring CPAP mask and tubing day of surgery.                              You may not have any metal on your body including hair pins, jewelry, and body  piercing             Do not wear make-up, lotions, powders, perfumes/cologne, or deodorant  Do not wear nail polish including gel and S&S, artificial/acrylic nails, or any other type of covering on natural nails including finger and toenails. If you have artificial nails, gel coating, etc. that needs to be removed by a nail salon please have this removed prior to surgery or surgery may need to be canceled/ delayed if the surgeon/ anesthesia feels like they are unable to be safely monitored.   Do not shave  48 hours prior to surgery.             Do not bring valuables to the hospital. [place].   Contacts, glasses, dentures or bridgework may not be worn into surgery.     DO NOT [place]. PHARMACY WILL DISPENSE MEDICATIONS LISTED ON YOUR MEDICATION LIST  TO YOU DURING YOUR ADMISSION [place]!    Patients discharged on the day of surgery will not be allowed to drive home.  Someone NEEDS to stay with you for the first 24 hours after anesthesia.   Special Instructions: Bring a copy of your healthcare power of attorney and living will documents the day of surgery if you haven't scanned them before.              Please read over the following fact sheets you were given: IF [place] [phone]   If you received a COVID test during your pre-op visit  it is requested that you wear a mask when out in public, stay away from anyone that may not be feeling well and notify your surgeon if you develop symptoms. If you test positive for Covid or have been in contact with anyone that has tested positive in the last 10 days please notify you surgeon.    [place] - Preparing for Surgery Before surgery, you can play an important role.  Because skin is not sterile, your skin needs to be as free of germs as possible.  You can reduce the number of germs on your  skin by washing with CHG (chlorahexidine gluconate) soap before surgery.  CHG is an antiseptic cleaner which kills germs and bonds with the skin to continue killing germs even after washing. Please DO NOT use if you have an allergy to CHG or antibacterial soaps.  If your skin becomes reddened/irritated stop using the CHG and inform your nurse when you arrive at Short Stay. Do not shave (including legs and underarms) for at least 48 hours prior to the first CHG shower.  You may shave your face/neck. Please follow these instructions carefully:  1.  Shower with CHG Soap the night before surgery and the  morning of Surgery.  2.  If you choose to wash your hair, wash your hair first as usual with your  normal  shampoo.  3.  After you shampoo, rinse your hair and body thoroughly to remove the  shampoo.                           4.  Use CHG as you would any other liquid soap.  You can apply chg directly  to the skin and wash                       Gently with a scrungie or clean washcloth.  5.  Apply the CHG Soap to your body ONLY FROM THE NECK DOWN.   Do not use on face/ open                           Wound or open sores. Avoid contact with eyes, ears mouth and genitals (private parts).                       Wash face,  Genitals (private parts) with your normal soap.             6.  Wash thoroughly, paying special attention to the area where your surgery  will be performed.  7.  Thoroughly rinse your body with warm water from the neck down.  8.  DO NOT shower/wash with your normal soap after using and rinsing off  the CHG Soap.  9.  Pat yourself dry with a clean towel.            10.  Wear clean pajamas.            11.  Place clean sheets on your bed the night of your first shower and do not  sleep with pets. Day of Surgery : Do not apply any lotions/deodorants the morning of surgery.  Please wear clean clothes to the hospital/surgery center.  FAILURE TO FOLLOW THESE INSTRUCTIONS MAY  RESULT IN THE CANCELLATION OF YOUR SURGERY PATIENT SIGNATURE_________________________________  NURSE SIGNATURE__________________________________  ________________________________________________________________________

## 2022-05-22 ENCOUNTER — Encounter (HOSPITAL_COMMUNITY): Payer: Self-pay

## 2022-05-22 ENCOUNTER — Other Ambulatory Visit: Payer: Self-pay

## 2022-05-22 ENCOUNTER — Encounter (HOSPITAL_COMMUNITY)
Admission: RE | Admit: 2022-05-22 | Discharge: 2022-05-22 | Disposition: A | Discharge status: Home / Self Care | Payer: Medicare Other | Source: Ambulatory Visit | Attending: Urology | Admitting: Urology

## 2022-05-22 VITALS — BP 169/89 | HR 69 | Temp 98.0°F | Resp 18 | Ht 64.0 in | Wt 383.0 lb

## 2022-05-22 DIAGNOSIS — E88819 Insulin resistance, unspecified: Secondary | ICD-10-CM | POA: Diagnosis not present

## 2022-05-22 DIAGNOSIS — I110 Hypertensive heart disease with heart failure: Secondary | ICD-10-CM | POA: Insufficient documentation

## 2022-05-22 DIAGNOSIS — E119 Type 2 diabetes mellitus without complications: Secondary | ICD-10-CM | POA: Insufficient documentation

## 2022-05-22 DIAGNOSIS — Z01812 Encounter for preprocedural laboratory examination: Secondary | ICD-10-CM | POA: Insufficient documentation

## 2022-05-22 DIAGNOSIS — I5032 Chronic diastolic (congestive) heart failure: Secondary | ICD-10-CM | POA: Diagnosis not present

## 2022-05-22 HISTORY — DX: Prediabetes: R73.03

## 2022-05-22 LAB — BASIC METABOLIC PANEL
Anion gap: 8 (ref 5–15)
BUN: 15 mg/dL (ref 8–23)
CO2: 30 mmol/L (ref 22–32)
Calcium: 9.5 mg/dL (ref 8.9–10.3)
Chloride: 101 mmol/L (ref 98–111)
Creatinine, Ser: 0.55 mg/dL (ref 0.44–1.00)
GFR, Estimated: >60 mL/min (ref >60)
Glucose, Bld: 111 mg/dL — ABNORMAL HIGH (ref 70–99)
Potassium: 4.1 mmol/L (ref 3.5–5.1)
Sodium: 139 mmol/L (ref 135–145)

## 2022-05-22 LAB — CBC
HCT: 44.2 % (ref 36.0–46.0)
Hemoglobin: 13.5 g/dL (ref 12.0–15.0)
MCH: 30.1 pg (ref 26.0–34.0)
MCHC: 30.5 g/dL (ref 30.0–36.0)
MCV: 98.7 fL (ref 80.0–100.0)
Platelets: 287 10*3/uL (ref 150–400)
RBC: 4.48 MIL/uL (ref 3.87–5.11)
RDW: 14.5 % (ref 11.5–15.5)
WBC: 7.3 10*3/uL (ref 4.0–10.5)
nRBC: 0 % (ref 0.0–0.2)

## 2022-05-22 LAB — GLUCOSE, CAPILLARY: Glucose-Capillary: 96 mg/dL (ref 70–99)

## 2022-05-23 ENCOUNTER — Ambulatory Visit: Admit: 2022-05-23 | Payer: Medicare Other | Admitting: Urology

## 2022-05-23 SURGERY — CYSTOSCOPY/URETEROSCOPY/HOLMIUM LASER/STENT PLACEMENT
Anesthesia: General | Laterality: Right

## 2022-05-24 LAB — URINE CULTURE: Culture: 50000 — AB

## 2022-05-29 NOTE — Anesthesia Preprocedure Evaluation (Addendum)
Anesthesia Evaluation  Patient identified by MRN, date of birth, ID band Patient awake    Reviewed: Allergy & Precautions, NPO status , Patient's Chart, lab work & pertinent test results  History of Anesthesia Complications (+) PONV and history of anesthetic complications  Airway Mallampati: II  TM Distance: >3 FB Neck ROM: Full    Dental no notable dental hx. (+) Teeth Intact, Dental Advisory Given, Implants   Pulmonary asthma , sleep apnea , former smoker   Pulmonary exam normal breath sounds clear to auscultation       Cardiovascular hypertension, Pt. on medications and Pt. on home beta blockers + Peripheral Vascular Disease  Normal cardiovascular exam Rhythm:Regular Rate:Normal     Neuro/Psych  Neuromuscular disease  negative psych ROS   GI/Hepatic Neg liver ROS,GERD  ,,  Endo/Other  diabetes, Type 2Hypothyroidism  Morbid obesity  Renal/GU Renal diseaseLab Results      Component                Value               Date                      CREATININE               0.55                05/22/2022                         K                        4.1                 05/22/2022                Bladder dysfunction      Musculoskeletal  (+) Arthritis ,  Fibromyalgia -  Abdominal   Peds  Hematology negative hematology ROS (+) Lab Results      Component                Value               Date                      WBC                      7.3                 05/22/2022                HGB                      13.5                05/22/2022                HCT                      44.2                05/22/2022                   PLT                      287  05/22/2022              Anesthesia Other Findings All: gabapentin , statins, morphine, codeine  Reproductive/Obstetrics                             Anesthesia Physical Anesthesia Plan  ASA: 4  Anesthesia Plan: General    Post-op Pain Management: Tylenol PO (pre-op)* and Precedex   Induction: Intravenous  PONV Risk Score and Plan: 4 or greater and TIVA, Dexamethasone, Ondansetron and Treatment may vary due to age or medical condition  Airway Management Planned: Oral ETT  Additional Equipment: None  Intra-op Plan:   Post-operative Plan: Extubation in OR  Informed Consent:      Dental advisory given  Plan Discussed with:   Anesthesia Plan Comments:        Anesthesia Quick Evaluation

## 2022-05-30 ENCOUNTER — Ambulatory Visit (HOSPITAL_COMMUNITY)
Admission: RE | Admit: 2022-05-30 | Discharge: 2022-05-30 | Disposition: A | Discharge status: Home / Self Care | Payer: Medicare Other | Source: Ambulatory Visit | Attending: Urology | Admitting: Urology

## 2022-05-30 ENCOUNTER — Ambulatory Visit (HOSPITAL_COMMUNITY): Payer: Medicare Other | Admitting: Physician Assistant

## 2022-05-30 ENCOUNTER — Ambulatory Visit (HOSPITAL_BASED_OUTPATIENT_CLINIC_OR_DEPARTMENT_OTHER): Payer: Medicare Other | Admitting: Anesthesiology

## 2022-05-30 ENCOUNTER — Encounter (HOSPITAL_COMMUNITY): Payer: Self-pay | Admitting: Urology

## 2022-05-30 ENCOUNTER — Ambulatory Visit (HOSPITAL_COMMUNITY): Payer: Medicare Other

## 2022-05-30 ENCOUNTER — Encounter (HOSPITAL_COMMUNITY)
Admission: RE | Disposition: A | Discharge status: Home / Self Care | Payer: Self-pay | Source: Ambulatory Visit | Attending: Urology

## 2022-05-30 DIAGNOSIS — Z6841 Body Mass Index (BMI) 40.0 and over, adult: Secondary | ICD-10-CM | POA: Diagnosis not present

## 2022-05-30 DIAGNOSIS — Z9884 Bariatric surgery status: Secondary | ICD-10-CM | POA: Insufficient documentation

## 2022-05-30 DIAGNOSIS — K219 Gastro-esophageal reflux disease without esophagitis: Secondary | ICD-10-CM | POA: Insufficient documentation

## 2022-05-30 DIAGNOSIS — M1990 Unspecified osteoarthritis, unspecified site: Secondary | ICD-10-CM | POA: Insufficient documentation

## 2022-05-30 DIAGNOSIS — E88819 Insulin resistance, unspecified: Secondary | ICD-10-CM

## 2022-05-30 DIAGNOSIS — I10 Essential (primary) hypertension: Secondary | ICD-10-CM | POA: Insufficient documentation

## 2022-05-30 DIAGNOSIS — Z87891 Personal history of nicotine dependence: Secondary | ICD-10-CM | POA: Insufficient documentation

## 2022-05-30 DIAGNOSIS — E1151 Type 2 diabetes mellitus with diabetic peripheral angiopathy without gangrene: Secondary | ICD-10-CM | POA: Diagnosis not present

## 2022-05-30 DIAGNOSIS — N3281 Overactive bladder: Secondary | ICD-10-CM | POA: Diagnosis not present

## 2022-05-30 DIAGNOSIS — E039 Hypothyroidism, unspecified: Secondary | ICD-10-CM | POA: Diagnosis not present

## 2022-05-30 DIAGNOSIS — Z79899 Other long term (current) drug therapy: Secondary | ICD-10-CM | POA: Insufficient documentation

## 2022-05-30 DIAGNOSIS — Z86711 Personal history of pulmonary embolism: Secondary | ICD-10-CM | POA: Diagnosis not present

## 2022-05-30 DIAGNOSIS — N200 Calculus of kidney: Secondary | ICD-10-CM | POA: Diagnosis present

## 2022-05-30 DIAGNOSIS — J45909 Unspecified asthma, uncomplicated: Secondary | ICD-10-CM | POA: Diagnosis not present

## 2022-05-30 DIAGNOSIS — G4730 Sleep apnea, unspecified: Secondary | ICD-10-CM | POA: Insufficient documentation

## 2022-05-30 DIAGNOSIS — M797 Fibromyalgia: Secondary | ICD-10-CM | POA: Diagnosis not present

## 2022-05-30 HISTORY — PX: CYSTOSCOPY/URETEROSCOPY/HOLMIUM LASER/STENT PLACEMENT: SHX6546

## 2022-05-30 LAB — GLUCOSE, CAPILLARY: Glucose-Capillary: 109 mg/dL — ABNORMAL HIGH (ref 70–99)

## 2022-05-30 SURGERY — CYSTOSCOPY/URETEROSCOPY/HOLMIUM LASER/STENT PLACEMENT
Anesthesia: General | Laterality: Right

## 2022-05-30 MED ORDER — ROCURONIUM BROMIDE 10 MG/ML (PF) SYRINGE
PREFILLED_SYRINGE | INTRAVENOUS | Status: AC
  Filled 2022-05-30: qty 10

## 2022-05-30 MED ORDER — PHENYLEPHRINE 80 MCG/ML (10ML) SYRINGE FOR IV PUSH (FOR BLOOD PRESSURE SUPPORT)
PREFILLED_SYRINGE | INTRAVENOUS | Status: AC
  Filled 2022-05-30: qty 10

## 2022-05-30 MED ORDER — OXYCODONE HCL 5 MG PO TABS: 5.0000 mg | ORAL_TABLET | Freq: Once | ORAL | Status: DC | PRN

## 2022-05-30 MED ORDER — ALBUTEROL SULFATE HFA 108 (90 BASE) MCG/ACT IN AERS
INHALATION_SPRAY | RESPIRATORY_TRACT | Status: DC | PRN
  Administered 2022-05-30: 5 via RESPIRATORY_TRACT

## 2022-05-30 MED ORDER — FLUCONAZOLE 100 MG PO TABS: 100.0000 mg | ORAL_TABLET | Freq: Every day | ORAL | 0 refills | Status: AC

## 2022-05-30 MED ORDER — AMISULPRIDE (ANTIEMETIC) 5 MG/2ML IV SOLN: 10.0000 mg | Freq: Once | INTRAVENOUS | Status: DC | PRN

## 2022-05-30 MED ORDER — ALBUTEROL SULFATE (2.5 MG/3ML) 0.083% IN NEBU
2.5000 mg | INHALATION_SOLUTION | RESPIRATORY_TRACT | Status: AC
  Administered 2022-05-30: 2.5 mg via RESPIRATORY_TRACT

## 2022-05-30 MED ORDER — SUCCINYLCHOLINE CHLORIDE 200 MG/10ML IV SOSY
PREFILLED_SYRINGE | INTRAVENOUS | Status: DC | PRN
  Administered 2022-05-30: 140 mg via INTRAVENOUS
  Administered 2022-05-30: 20 mg via INTRAVENOUS

## 2022-05-30 MED ORDER — PHENYLEPHRINE HCL-NACL 20-0.9 MG/250ML-% IV SOLN
INTRAVENOUS | Status: DC | PRN
  Administered 2022-05-30: 50 ug/min via INTRAVENOUS

## 2022-05-30 MED ORDER — SUCCINYLCHOLINE CHLORIDE 200 MG/10ML IV SOSY
PREFILLED_SYRINGE | INTRAVENOUS | Status: AC
  Filled 2022-05-30: qty 10

## 2022-05-30 MED ORDER — ONDANSETRON HCL 4 MG/2ML IJ SOLN
INTRAMUSCULAR | Status: DC | PRN
  Administered 2022-05-30: 4 mg via INTRAVENOUS

## 2022-05-30 MED ORDER — MIDAZOLAM HCL 5 MG/5ML IJ SOLN
INTRAMUSCULAR | Status: DC | PRN
  Administered 2022-05-30: 2 mg via INTRAVENOUS

## 2022-05-30 MED ORDER — PROPOFOL 500 MG/50ML IV EMUL
INTRAVENOUS | Status: DC | PRN
  Administered 2022-05-30: 150 ug/kg/min via INTRAVENOUS

## 2022-05-30 MED ORDER — SODIUM CHLORIDE 0.9 % IR SOLN
Status: DC | PRN
  Administered 2022-05-30: 6000 mL

## 2022-05-30 MED ORDER — CHLORHEXIDINE GLUCONATE 0.12 % MT SOLN
15.0000 mL | Freq: Once | OROMUCOSAL | Status: AC
  Administered 2022-05-30: 15 mL via OROMUCOSAL

## 2022-05-30 MED ORDER — TRAMADOL HCL 50 MG PO TABS: 50.0000 mg | ORAL_TABLET | Freq: Four times a day (QID) | ORAL | 0 refills | Status: AC | PRN

## 2022-05-30 MED ORDER — ALBUTEROL SULFATE (2.5 MG/3ML) 0.083% IN NEBU
INHALATION_SOLUTION | RESPIRATORY_TRACT | Status: AC
  Filled 2022-05-30: qty 3

## 2022-05-30 MED ORDER — ONDANSETRON HCL 4 MG/2ML IJ SOLN: 4.0000 mg | Freq: Once | INTRAMUSCULAR | Status: DC | PRN

## 2022-05-30 MED ORDER — ORAL CARE MOUTH RINSE: 15.0000 mL | Freq: Once | OROMUCOSAL | Status: AC

## 2022-05-30 MED ORDER — HYDROMORPHONE HCL 1 MG/ML IJ SOLN: 0.2500 mg | INTRAMUSCULAR | Status: DC | PRN

## 2022-05-30 MED ORDER — EPHEDRINE 5 MG/ML INJ
INTRAVENOUS | Status: AC
  Filled 2022-05-30: qty 5

## 2022-05-30 MED ORDER — DEXMEDETOMIDINE HCL IN NACL 80 MCG/20ML IV SOLN
INTRAVENOUS | Status: AC
  Filled 2022-05-30: qty 20

## 2022-05-30 MED ORDER — PROPOFOL 10 MG/ML IV BOLUS
INTRAVENOUS | Status: DC | PRN
  Administered 2022-05-30: 20 mg via INTRAVENOUS
  Administered 2022-05-30: 220 mg via INTRAVENOUS
  Administered 2022-05-30: 50 mg via INTRAVENOUS

## 2022-05-30 MED ORDER — SODIUM CHLORIDE 0.9% FLUSH: 3.0000 mL | Freq: Two times a day (BID) | INTRAVENOUS | Status: DC

## 2022-05-30 MED ORDER — DEXMEDETOMIDINE HCL IN NACL 80 MCG/20ML IV SOLN
INTRAVENOUS | Status: DC | PRN
  Administered 2022-05-30: 8 ug via BUCCAL
  Administered 2022-05-30: 4 ug via BUCCAL

## 2022-05-30 MED ORDER — FENTANYL CITRATE (PF) 100 MCG/2ML IJ SOLN
INTRAMUSCULAR | Status: AC
  Filled 2022-05-30: qty 2

## 2022-05-30 MED ORDER — FENTANYL CITRATE (PF) 100 MCG/2ML IJ SOLN
INTRAMUSCULAR | Status: DC | PRN
  Administered 2022-05-30: 100 ug via INTRAVENOUS

## 2022-05-30 MED ORDER — LACTATED RINGERS IV SOLN: INTRAVENOUS | Status: DC

## 2022-05-30 MED ORDER — LIDOCAINE 2% (20 MG/ML) 5 ML SYRINGE
INTRAMUSCULAR | Status: DC | PRN
  Administered 2022-05-30: 100 mg via INTRAVENOUS

## 2022-05-30 MED ORDER — SODIUM CHLORIDE 0.9 % IV SOLN
2.0000 g | Freq: Once | INTRAVENOUS | Status: AC
  Administered 2022-05-30: 2 g via INTRAVENOUS
  Filled 2022-05-30: qty 20

## 2022-05-30 MED ORDER — PROPOFOL 1000 MG/100ML IV EMUL
INTRAVENOUS | Status: AC
  Filled 2022-05-30: qty 100

## 2022-05-30 MED ORDER — DEXAMETHASONE SODIUM PHOSPHATE 10 MG/ML IJ SOLN
INTRAMUSCULAR | Status: DC | PRN
  Administered 2022-05-30: 5 mg via INTRAVENOUS

## 2022-05-30 MED ORDER — 0.9 % SODIUM CHLORIDE (POUR BTL) OPTIME
TOPICAL | Status: DC | PRN
  Administered 2022-05-30: 1000 mL

## 2022-05-30 MED ORDER — ACETAMINOPHEN 10 MG/ML IV SOLN: 1000.0000 mg | Freq: Once | INTRAVENOUS | Status: DC | PRN

## 2022-05-30 MED ORDER — MIDAZOLAM HCL 2 MG/2ML IJ SOLN
INTRAMUSCULAR | Status: AC
  Filled 2022-05-30: qty 2

## 2022-05-30 MED ORDER — PROPOFOL 500 MG/50ML IV EMUL
INTRAVENOUS | Status: AC
  Filled 2022-05-30: qty 50

## 2022-05-30 MED ORDER — LIDOCAINE HCL (PF) 2 % IJ SOLN
INTRAMUSCULAR | Status: AC
  Filled 2022-05-30: qty 5

## 2022-05-30 MED ORDER — TRAMADOL HCL 50 MG PO TABS: 50.0000 mg | ORAL_TABLET | Freq: Four times a day (QID) | ORAL | Status: DC | PRN

## 2022-05-30 MED ORDER — PHENYLEPHRINE 80 MCG/ML (10ML) SYRINGE FOR IV PUSH (FOR BLOOD PRESSURE SUPPORT)
PREFILLED_SYRINGE | INTRAVENOUS | Status: DC | PRN
  Administered 2022-05-30 (×2): 80 ug via INTRAVENOUS

## 2022-05-30 MED ORDER — OXYCODONE HCL 5 MG/5ML PO SOLN: 5.0000 mg | Freq: Once | ORAL | Status: DC | PRN

## 2022-05-30 MED ORDER — FLUCONAZOLE IN SODIUM CHLORIDE 200-0.9 MG/100ML-% IV SOLN
200.0000 mg | Freq: Once | INTRAVENOUS | Status: AC
  Administered 2022-05-30: 200 mg via INTRAVENOUS
  Filled 2022-05-30: qty 100

## 2022-05-30 SURGICAL SUPPLY — 26 items
BAG URO CATCHER STRL LF (MISCELLANEOUS) ×2 IMPLANT
BASKET STONE NCOMPASS (UROLOGICAL SUPPLIES) IMPLANT
CATH URETERAL DUAL LUMEN 10F (MISCELLANEOUS) IMPLANT
CATH URETL OPEN 5X70 (CATHETERS) IMPLANT
CLOTH BEACON ORANGE TIMEOUT ST (SAFETY) ×2 IMPLANT
EXTRACTOR STONE NITINOL NGAGE (UROLOGICAL SUPPLIES) IMPLANT
GLOVE SURG SS PI 8.0 STRL IVOR (GLOVE) ×2 IMPLANT
GOWN STRL REUS W/ TWL XL LVL3 (GOWN DISPOSABLE) ×2 IMPLANT
GOWN STRL REUS W/TWL XL LVL3 (GOWN DISPOSABLE) ×1
GUIDEWIRE STR DUAL SENSOR (WIRE) ×2 IMPLANT
IV NS IRRIG 3000ML ARTHROMATIC (IV SOLUTION) ×2 IMPLANT
KIT TURNOVER KIT A (KITS) IMPLANT
LASER FIB FLEXIVA PULSE ID 365 (Laser) IMPLANT
LASER FIB FLEXIVA PULSE ID 550 (Laser) IMPLANT
LASER FIB FLEXIVA PULSE ID 910 (Laser) IMPLANT
MANIFOLD NEPTUNE II (INSTRUMENTS) ×2 IMPLANT
MAT PREVALON FULL STRYKER (MISCELLANEOUS) IMPLANT
PACK CYSTO (CUSTOM PROCEDURE TRAY) ×2 IMPLANT
SHEATH NAVIGATOR HD 11/13X36 (SHEATH) IMPLANT
SHEATH NAVIGATOR HD 12/14X28 (SHEATH) IMPLANT
SHEATH NAVIGATOR HD 12/14X36 (SHEATH) IMPLANT
STENT URET 6FRX26 CONTOUR (STENTS) IMPLANT
TRACTIP FLEXIVA PULS ID 200XHI (Laser) IMPLANT
TRACTIP FLEXIVA PULSE ID 200 (Laser) ×1
TUBING CONNECTING 10 (TUBING) ×2 IMPLANT
TUBING UROLOGY SET (TUBING) ×2 IMPLANT

## 2022-05-30 NOTE — H&P (Signed)
Subjective:    Tricia Perry has a right renal stone and was stented on 04/11/22 for sepsis.  She returned on 05/02/22 for ureteroscopy but was found to have cloudy urine and concern for yeast.  I couldn't see the stone.  She was treated and returns now for a second attempt.  She reports cloudy urine but no other complaints.   ROS:  Review of Systems  All other systems reviewed and are negative.   Allergies  Allergen Reactions   Gabapentin Swelling    Legs    Mold Extract [Trichophyton] Nausea And Vomiting and Other (See Comments)    Sneezing real bad    Morphine Itching and Other (See Comments)    back hurts   Statins Other (See Comments)    Muscle pain   Codeine Itching and Other (See Comments)    Back hurts    Past Medical History:  Diagnosis Date   Arthritis    Knees; right-TKR, left-bone on bone/end stage   Asthma    Carpal tunnel syndrome on both sides 06/21/2010   Carpal tunnel syndrome, bilateral    Chronic nonallergic rhinitis    allergy eval NEG 10/2014: azelastine and flonase spray rx'd   Chronic venous insufficiency    with edema  and extensive varicose dz: Dr. Linus Mako is managing this, planning laser procedures.; A LOT OF LEG CRAMPS AND LEG STIFFNESS   Complication of anesthesia    STATES SHE WAS TOLD SHE WOKE UP DURING HER KNEE REPLACEMENT SURGERY AND HAD TO BE GIVEN MORE MEDICINE - NOT SURE IF SPINAL OR GENERAL ANESTHESIA   DDD (degenerative disc disease)    Dr. [name] evaluated her and recommended pain mgmt MD.  She saw Dr. Selinda Orion for pain mgmt at one time.   Diabetes mellitus without complication ([place])    Disequilibrium syndrome    MRI brain normal 2012   DIZZINESS 04/12/2010   Qualifier: Diagnosis of  By: Anitra Lauth M.D., Brien Few    Fibromyalgia    questionable   GERD (gastroesophageal reflux disease)    Heart murmur    functional, w/u done; PALPITATIONS IN THE PAST   History of bronchitis    History of kidney stones    History of palpitations     History of pulmonary embolism 05/2013   Right sided.  Setting: post-op percutaneous nephrolithotomy--xarelto x 6 mo   Hypertension    Hypothyroidism    Insomnia    Insulin resistance    Kidney stone on right side 12/2014   Dr. Jeffie Pollock considering PCNL, ESWL, and ureteroscopy as of 01/25/15    Microscopic hematuria 2014   CT abd pelv showed 1.8 cm right renal pelvis stone.  Also had UA c/w UTI so abx given.    Mixed incontinence urge and stress    and nocturia x 5-6 per night   Morbid obesity (HCC)    BMI 49.  Gastric stapling surgery in distant past.   Nephrolithiasis    Lithotripsy 2002.  Right percut nephrolith 05/2013.   Nocturia    Numbness and tingling    bilateral hands and feet   OAB (overactive bladder)    Per Dr. Jeffie Pollock: pt declined pelvic floor PT.  Vesicare trial started 01/25/15.   Obesity hypoventilation syndrome (HCC)    Peripheral neuropathy    No old records to confirm pt's report.  Pt refuses to have more NCS b/c test is painful.   Pneumonia    Shortness of breath    CHRONIC; 3 mo trial off  of ACE-I made no difference.  Spirometry x 2 has shown no obstruction.  Allergy w/u NEG.   Shoulder pain    HX OF BILATERAL SHOULDER SURGERIES - PT HAS VERY LIMITED ROM - ESPECIALLY RAISING HER ARMS   Sleep apnea    cpap with o2 bleed in 2.5L   Spinal stenosis    with L5 radiculopathy, also pseudospondylolisthesis per Dr. [name]   Superficial thrombophlebitis of left leg 05/2013   with cellulitis--resolved approp with abx and ice   Urinary incontinence    Urinary urgency    Vertigo     Past Surgical History:  Procedure Laterality Date   BILATERAL SHOULDER SURGERY     BREAST EXCISIONAL BIOPSY Left 11/12/2015   BREAST LUMPECTOMY WITH RADIOACTIVE SEED LOCALIZATION Left 11/12/2015   Procedure: LEFT BREAST LUMPECTOMY WITH RADIOACTIVE SEED LOCALIZATION;  Surgeon: Autumn Messing III, MD;  Location: [facility];  Service: General;  Laterality: Left;   [place] Right 04/11/2022   Procedure: CYSTOSCOPY WITH RETROGRADE PYELOGRAM/URETERAL STENT PLACEMENT;  Surgeon: Remi Haggard, MD;  Location: [facility];  Service: Urology;  Laterality: Right;   CYSTOSCOPY/URETEROSCOPY/HOLMIUM LASER/STENT PLACEMENT Right 05/02/2022   Procedure: CYSTOSCOPY/ RIGHT URETEROSCOPY/STONE BASKET RETRIVAL/STENT EXCHANGE;  Surgeon: Irine Seal, MD;  Location: [facility];  Service: Urology;  Laterality: Right;  1 HR FOR CASE   ENDOMETRIAL ABLATION     menorrhagia   EXTRACORPOREAL SHOCK WAVE LITHOTRIPSY     FOOT SURGERY  2000   Right tarsel tunnel release   GASTRIC RESTRICTION SURGERY  1985   Gastric stapling, and hernia surgery   HERNIA REPAIR   1980's   Diaphragmatic + gastric stapling   HERNIA REPAIR     Inguinal   JOINT REPLACEMENT  2009   Right Knee, [place]   NEPHROLITHOTOMY Right 05/31/2013   Procedure: NEPHROLITHOTOMY PERCUTANEOUS;  Surgeon: Irine Seal, MD;  Location: [facility];  Service: Urology;  Laterality: Right;   PFT's  09/2013; 12/2013   09/2013 mild restriction.  12/2013 Low vital capacity, possibly due to restriction from pt's body habitus.   TRANSTHORACIC ECHOCARDIOGRAM  12/16/2013   EF [id number], grade 2 diastolic dysfxn, PA pressure 37 (mildly high)    Social History   Socioeconomic History   Marital status: Married    Spouse name: Herbie Baltimore   Number of children: 3   Years of education: Not on file   Highest education level: Not on file  Occupational History   Occupation: HOUSEWIFE    Employer: UNEMPLOYED  Tobacco Use   Smoking status: Former    Packs/day: 1.00    Years: 3.00    Total pack years: 3.00    Types: Cigarettes    Start date: 05/17/1976    Quit date: 03/31/1998    Years since quitting: 24.1   Smokeless tobacco: Never  Vaping Use   Vaping Use: Never used  Substance and Sexual Activity   Alcohol use: Not Currently    Comment: social   Drug use: No   Sexual activity: Not Currently  Other Topics Concern   Not on  file  Social History Narrative   Married, currently living in [place].   Occupation: no.  Disabled secondary to chronic pain (osteoarthritis in back and knees).   Exercise: staying busy for 2-3 hours a day but no formal exercise due to chronic pain.   Diet: "normal" diet, trying to increase water.   No Tob/alc/drugs.      Social Determinants of Health  Financial Resource Strain: Not on file  Food Insecurity: No Food Insecurity (05/02/2022)   Hunger Vital Sign    Worried About Running Out of Food in the Last Year: Never true    Ran Out of Food in the Last Year: Never true  Transportation Needs: No Transportation Needs (05/02/2022)   PRAPARE - Hydrologist (Medical): No    Lack of Transportation (Non-Medical): No  Physical Activity: Not on file  Stress: Not on file  Social Connections: Not on file  Intimate Partner Violence: Not At Risk (05/02/2022)   Humiliation, Afraid, Rape, and Kick questionnaire    Fear of Current or Ex-Partner: No    Emotionally Abused: No    Physically Abused: No    Sexually Abused: No    Family History  Problem Relation Age of Onset   Hypertension Mother    Cancer Mother        Brain/Lung/ smoker   Diabetes Mother    Stroke Father    Hypertension Father    ADD / ADHD Daughter        ADHD   Thyroid disease Brother    Hypertension Brother    Varicose Veins Brother    Peripheral vascular disease Brother    Heart attack Maternal Grandfather    Alcohol abuse Paternal Grandfather    Breast cancer Maternal Grandmother    Breast cancer Paternal Aunt    Breast cancer Paternal Aunt     Anti-infectives: Anti-infectives (From admission, onward)    Start     Dose/Rate Route Frequency Ordered Stop   05/30/22 0730  fluconazole (DIFLUCAN) IVPB 200 mg        200 mg 100 mL/hr over 60 Minutes Intravenous  Once 05/30/22 0715     05/30/22 0730  cefTRIAXone (ROCEPHIN) 2 g in sodium chloride 0.9 % 100 mL IVPB        2 g 200 mL/hr over 30  Minutes Intravenous  Once 05/30/22 0715         Current Facility-Administered Medications  Medication Dose Route Frequency Provider Last Rate Last Admin   cefTRIAXone (ROCEPHIN) 2 g in sodium chloride 0.9 % 100 mL IVPB  2 g Intravenous Once Irine Seal, MD       chlorhexidine (PERIDEX) 0.12 % solution 15 mL  15 mL Mouth/Throat Once Brennan Bailey, MD       Or   Oral care mouth rinse  15 mL Mouth Rinse Once Brennan Bailey, MD       fluconazole (DIFLUCAN) IVPB 200 mg  200 mg Intravenous Once Irine Seal, MD       lactated ringers infusion   Intravenous Continuous Brennan Bailey, MD         Objective: Vital signs in last 24 hours: BP (!) 182/86   Pulse 73   Temp 98 F (36.7 C) (Oral)   Resp 20   Ht '5\' 4"'$  (1.626 m)   Wt (!) 173.7 kg   SpO2 93%   BMI 65.74 kg/m   Intake/Output from previous day: No intake/output data recorded. Intake/Output this shift: No intake/output data recorded.   Physical Exam Vitals reviewed.  Constitutional:      Appearance: She is obese.  Cardiovascular:     Rate and Rhythm: Normal rate and regular rhythm.  Pulmonary:     Effort: Pulmonary effort is normal. No respiratory distress.  Neurological:     Mental Status: She is alert.     Lab Results:  Results for orders placed or performed during the hospital encounter of 05/30/22 (from the past 24 hour(s))  Glucose, capillary     Status: Abnormal   Collection Time: 05/30/22  6:59 AM  Result Value Ref Range   Glucose-Capillary 109 (H) 70 - 99 mg/dL    BMET No results for input(s): "NA", "K", "CL", "CO2", "GLUCOSE", "BUN", "CREATININE", "CALCIUM" in the last 72 hours. PT/INR No results for input(s): "LABPROT", "INR" in the last 72 hours. ABG No results for input(s): "PHART", "HCO3" in the last 72 hours.  Invalid input(s): "PCO2", "PO2"  Studies/Results: No results found.   Assessment/Plan: Right renal stones.   She has a stent in place and is for a second attempt at  ureteroscopy.  Candiduria.  She was treated after the last ureteroscopy and will be covered with fluconazole today.         No follow-ups on file.       Irine Seal 05/30/2022

## 2022-05-30 NOTE — Op Note (Signed)
Procedure: 1.  Cystoscopy with removal and replacement of right double-J stent, right ureteroscopy with holmium laser application and stone extraction. 2.  Application of fluoroscopy.  Preop diagnosis: Right renal stone.  Postop diagnosis: Same.  Surgeon: Dr. Irine Seal.  Anesthesia: General.  Specimen: None.  Drain: 6 [place] by 26 cm right contour double-J stent with tether.  EBL: None.  Complications: None.  Indications: The patient is a 67 year old female who has a 6 mm right renal stone that caused obstruction and sepsis for which she was stented in January.  She was brought back to the operating room on 05/02/2022 but at that time was found to have very turbid urine which was suspicious for a yeast infection and I was unable to visualize the stone sufficiently to treat.  The stent was exchanged that time and she subsequently did grow yeast on the culture and was placed on fluconazole.  She returns now for another attempt.  Procedure: She was taken the operating room where she was given Rocephin and fluconazole.  A general anesthetic was induced.  She was placed in lithotomy position and fitted with PAS hose.  Her perineum and genitalia were prepped with Betadine solution she was draped in usual sterile fashion.  Cystoscopy was performed using a 21 [place] scope and 30 degree lens.  The stent loose was identified at the right ureteral orifice.  There was some bladder mucosal erythema and slight turbidity urine but no other abnormalities were noted.  The stent was grasped and pulled the urethral meatus.  A sensor wire was advanced through the stent to the kidney under fluoroscopic guidance and the stent was removed.  A 36 cm 12/14 French digital access sheath inner core was passed without difficulty to the kidney and this was followed by the assembled sheath.  The inner core and wire were then removed.  The dual-lumen digital scope was then passed and the stone was identified in a midpole  calyx.  There was a small amount of flocculent debris in an upper pole but it was much clearer than on initial attempt.  The stone was then fragmented using the [place] fiber with the dusting setting.  The 0.3 J 60 Hz setting was used to fragment the stone.  Once the stone was adequately fragmented the fragments were removed using an engage basket.  Once all of the fragments were removed a few small clots and some of the flocculent material removed as well and there were a few additional fragments trapped in the nose.  Final inspection revealed no significant residual stone material.  The ureteroscope was removed over the wire and the ureter was inspected with no significant injury identified.  The cystoscope was reinserted over the wire and a fresh 6 [place] by 26 cm contour double-J stent with tether was advanced the kidney under fluoroscopic guidance.  The wire was removed, leaving good coil in the kidney and a good coil in the bladder.  The bladder was drained and the cystoscope was removed leaving the stent string exiting the urethra.  The string was knotted close to the meatus and trimmed to an appropriate length before being taught vaginally and a tampon string fashion.  The I then inspected the drapes but did not find any sizable fragments that could be collected for analysis I did pathology and once removed had been quite small.  She was then taken down from the lithotomy position, her anesthetic was reversed and she was moved to recovery in  stable condition.  There were no complications.

## 2022-05-30 NOTE — Transfer of Care (Signed)
Immediate Anesthesia Transfer of Care Note  Patient: Tricia Perry  Procedure(s) Performed: CYSTOSCOPY RIGHT URETEROSCOPY/HOLMIUM LASER/STENT EXCHANGE (Right)  Patient Location: PACU  Anesthesia Type:General  Level of Consciousness: awake, alert , and oriented  Airway & Oxygen Therapy: Patient Spontanous Breathing and Patient connected to face mask oxygen  Post-op Assessment: Report given to RN and Post -op Vital signs reviewed and stable  Post vital signs: Reviewed and stable  Last Vitals:  Vitals Value Taken Time  BP 143/73 05/30/22 1008  Temp    Pulse 74 05/30/22 1011  Resp 22 05/30/22 1011  SpO2 94 % 05/30/22 1011  Vitals shown include unvalidated device data.  Last Pain:  Vitals:   05/30/22 0702  TempSrc: Oral  PainSc: 7       Patients Stated Pain Goal: 4 ([id number])  Complications: No notable events documented.

## 2022-05-30 NOTE — Anesthesia Postprocedure Evaluation (Signed)
Anesthesia Post Note  Patient: Tricia Perry  Procedure(s) Performed: CYSTOSCOPY RIGHT URETEROSCOPY/HOLMIUM LASER/STENT EXCHANGE (Right)     Patient location during evaluation: PACU Anesthesia Type: General Level of consciousness: awake and alert Pain management: pain level controlled Vital Signs Assessment: post-procedure vital signs reviewed and stable Respiratory status: spontaneous breathing, nonlabored ventilation, respiratory function stable and patient connected to nasal cannula oxygen Cardiovascular status: blood pressure returned to baseline and stable Postop Assessment: no apparent nausea or vomiting Anesthetic complications: no  No notable events documented.  Last Vitals:  Vitals:   05/30/22 1045 05/30/22 1102  BP: (!) 146/90 (!) 176/97  Pulse: 72 70  Resp: 18 17  Temp:  36.5 C  SpO2: 95% 96%    Last Pain:  Vitals:   05/30/22 1102  TempSrc:   PainSc: 0-No pain                 Barnet Glasgow

## 2022-05-30 NOTE — Anesthesia Procedure Notes (Signed)
Procedure Name: Intubation Date/Time: 05/30/2022 8:53 AM  Performed by: Maxwell Caul, CRNAPre-anesthesia Checklist: Patient identified, Emergency Drugs available, Suction available and Patient being monitored Patient Re-evaluated:Patient Re-evaluated prior to induction Oxygen Delivery Method: Circle system utilized Preoxygenation: Pre-oxygenation with 100% oxygen Induction Type: IV induction Ventilation: Mask ventilation without difficulty Laryngoscope Size: Mac and 4 Grade View: Grade I Tube type: Oral Tube size: 7.0 mm Number of attempts: 1 Airway Equipment and Method: Stylet Placement Confirmation: ETT inserted through vocal cords under direct vision, positive ETCO2 and breath sounds checked- equal and bilateral Secured at: 21 cm Tube secured with: Tape Dental Injury: Teeth and Oropharynx as per pre-operative assessment

## 2022-05-30 NOTE — Discharge Instructions (Addendum)
You may remove the stent on Monday morning by pulling the string that is tucked vaginally like a tampon string.  If you don't feel you can do that, please call the office to come have it done.   I sent fluconazole to cover for yeast for the next 5 days and tramadol for pain.

## 2022-05-31 ENCOUNTER — Encounter (HOSPITAL_COMMUNITY): Payer: Self-pay | Admitting: Urology

## 2022-06-03 ENCOUNTER — Other Ambulatory Visit: Payer: Self-pay | Admitting: Primary Care

## 2022-06-10 ENCOUNTER — Other Ambulatory Visit (HOSPITAL_COMMUNITY): Payer: Self-pay

## 2022-06-19 ENCOUNTER — Telehealth (HOSPITAL_BASED_OUTPATIENT_CLINIC_OR_DEPARTMENT_OTHER): Payer: Self-pay | Admitting: Pulmonary Disease

## 2022-06-19 NOTE — Telephone Encounter (Addendum)
[facility] Telephone Encounter  Patient offered clinic appointment by front desk. Overdue for follow-up visit. Last seen in clinic 09/24/21.   Patient declined preferring summer appointment.

## 2022-06-25 ENCOUNTER — Other Ambulatory Visit (HOSPITAL_COMMUNITY): Payer: Self-pay

## 2022-07-04 ENCOUNTER — Other Ambulatory Visit (HOSPITAL_COMMUNITY): Payer: Self-pay

## 2022-07-18 ENCOUNTER — Ambulatory Visit (HOSPITAL_BASED_OUTPATIENT_CLINIC_OR_DEPARTMENT_OTHER): Payer: Medicare Other | Admitting: Pulmonary Disease

## 2022-07-25 ENCOUNTER — Ambulatory Visit (HOSPITAL_BASED_OUTPATIENT_CLINIC_OR_DEPARTMENT_OTHER): Payer: Medicare Other | Admitting: Pulmonary Disease

## 2022-07-31 ENCOUNTER — Telehealth (HOSPITAL_BASED_OUTPATIENT_CLINIC_OR_DEPARTMENT_OTHER): Payer: Self-pay | Admitting: Pulmonary Disease

## 2022-07-31 NOTE — Telephone Encounter (Signed)
[facility] Telephone Encounter  Pulmonary office contacted by [facility], [place], [place] [address], [phone]) to provide documentation related to continued medical necessity of ventilator use for insurance coverage.  Patient last seen in clinic on 09/24/21. Has missed/cancelled two appointments in the last 30 days (April). Scheduled for follow-up on 08/05/22 for ventilator compliance which was initially ordered 05/02/20 prior to hospital discharge for chronic respiratory failure with hypercarbia  Last note 09/24/21 demonstrating compliance with vent.  Ventilator compliance report 06/14/21-09/11/21 Patient uses NIV >70% of nights during the last three months of usage.             Usage days >88.9% Usage >4 hours 45.6%  To comply with auditor's request will fax 09/24/21 Pulmonary clinic note to [phone] with Attn: Re: Marvetta Gibbons, when patient presents for follow-up on 08/05/22 - will need compliance report and will need fax current office visit note [phone] with Attn: Re: Auth

## 2022-08-04 ENCOUNTER — Telehealth: Payer: Self-pay | Admitting: *Deleted

## 2022-08-04 NOTE — Telephone Encounter (Signed)
LVM for [employer] to fax DL for Trilegy vent to [phone] attention Keyondre Hepburn.  Will await fax.  No fax received.  Called [employer] and left a VM to return call.  Attempting to get a download on her Trilegy vent.  Will await return call.

## 2022-08-04 NOTE — Telephone Encounter (Signed)
Office visit notes from 09/24/2021 has been printed and faxed over to [phone].   Nothing further needed.

## 2022-08-05 ENCOUNTER — Ambulatory Visit: Payer: Medicare Other | Admitting: Adult Health

## 2022-08-05 ENCOUNTER — Encounter: Payer: Self-pay | Admitting: Adult Health

## 2022-08-05 VITALS — BP 130/78 | HR 107 | Temp 98.3°F | Ht 64.5 in | Wt 372.4 lb

## 2022-08-05 DIAGNOSIS — E662 Morbid (severe) obesity with alveolar hypoventilation: Secondary | ICD-10-CM | POA: Insufficient documentation

## 2022-08-05 DIAGNOSIS — J9612 Chronic respiratory failure with hypercapnia: Secondary | ICD-10-CM | POA: Diagnosis not present

## 2022-08-05 NOTE — Patient Instructions (Addendum)
Chronic hypercarbic and Hypoxic Respiratory failure secondary to Sleep apnea and Hypoventilation syndrome  --Continue Trilogy ventilator nightly with Oxygen 2l/m  --Oxygen 2L continuous or 2L pulsed on POC with exertion -- Trilogy download .   Asthma --Albuterol inhaler As needed   --Continue Singulair 10 mg daily --Flonase nasal As needed    Follow-up with Dr. Everardo All in 6 months and As needed

## 2022-08-05 NOTE — Assessment & Plan Note (Signed)
OSA/OHS-continue on nocturnal trilogy with oxygen.  Trilogy download requested

## 2022-08-05 NOTE — Assessment & Plan Note (Addendum)
Chronic hypoxic and hypercarbic respiratory failure secondary to underlying obstructive sleep apnea and OHS.  Continue on oxygen to keep O2 saturations greater than 88 to 90% and on nocturnal vent support with trilogy device and O2  Trilogy download requested POC ordered  Plan  Patient Instructions  Chronic hypercarbic and Hypoxic Respiratory failure secondary to Sleep apnea and Hypoventilation syndrome  --Continue Trilogy ventilator nightly with Oxygen 2l/m  --Oxygen 2L continuous or 2L pulsed on POC with exertion -- Trilogy download .   Asthma --Albuterol inhaler As needed   --Continue Singulair 10 mg daily --Flonase nasal As needed    Follow-up with Dr. Everardo All in 6 months and As needed

## 2022-08-05 NOTE — Progress Notes (Signed)
@Patient  ID: Tricia Perry, female    DOB: [birth year], 67 y.o.   MRN: [id number]  Chief Complaint  Patient presents with   Follow-up    Referring provider: Estevan Oaks, NP  HPI: 67 year old female with minimal smoker  with morbid obesity followed for chronic hypoxic and hypercarbic respiratory failure secondary to sleep apnea and obesity hypoventilation syndrome on oxygen and trilogy ventilator. Medical history significant for chronic diastolic heart failure  TEST/EVENTS :   08/05/2022 Follow up: Chronic hypoxic and hypercarbic respiratory failure Patient presents for a 1 year follow-up.  Patient chronic hypoxic and hypercarbic respiratory failure secondary to underlying sleep apnea and obesity hypoventilation syndrome.  She is on oxygen at home as needed and at nighttime with her trilogy ventilator.  Patient says she uses her trilogy vent every single night.  Feels that she benefits from this.  DME company is [employer].  We have reached out to them for a trilogy download Patient does have oxygen at home to use with activity as needed.  Patient would like a portable system.  Last visit patient did not have any significant desaturations and did not qualify for a POC device.  Today in the office patient dropped her oxygen level down to 87% on room air requiring 2 L of pulsed oxygen on POC device to maintain O2 saturation greater than 88 to 90%.  Patient has mild intermittent asthma.  Is on Singulair daily.  Rarely uses any albuterol.   Allergies  Allergen Reactions   Gabapentin Swelling    Legs    Mold Extract [Trichophyton] Nausea And Vomiting and Other (See Comments)    Sneezing real bad    Morphine Itching and Other (See Comments)    back hurts   Statins Other (See Comments)    Muscle pain   Codeine Itching and Other (See Comments)    Back hurts    Immunization History  Administered Date(s) Administered   Influenza Split 03/31/2008, 05/10/2010, 12/30/2010   Influenza Whole  05/10/2010   Influenza,inj,Quad PF,6+ Mos 05/18/2014, 01/14/2016, 12/03/2016   Influenza,inj,Quad PF,6-35 Mos 01/14/2016, 12/03/2016   Influenza-Unspecified 11/29/2016   Moderna SARS-COV2 Booster Vaccination 03/27/2020   Moderna Sars-Covid-2 Vaccination 05/13/2019, 06/10/2019   Pneumococcal Polysaccharide-23 10/28/2012   Td 03/31/2001   Td (Adult),5 Lf Tetanus Toxid, Preservative Free 03/31/2001   Tdap 04/01/2003, 10/28/2012    Past Medical History:  Diagnosis Date   Arthritis    Knees; right-TKR, left-bone on bone/end stage   Asthma    Carpal tunnel syndrome on both sides 06/21/2010   Carpal tunnel syndrome, bilateral    Chronic nonallergic rhinitis    allergy eval NEG 10/2014: azelastine and flonase spray rx'd   Chronic venous insufficiency    with edema  and extensive varicose dz: Dr. Consuela Mimes is managing this, planning laser procedures.; A LOT OF LEG CRAMPS AND LEG STIFFNESS   Complication of anesthesia    STATES SHE WAS TOLD SHE WOKE UP DURING HER KNEE REPLACEMENT SURGERY AND HAD TO BE GIVEN MORE MEDICINE - NOT SURE IF SPINAL OR GENERAL ANESTHESIA   DDD (degenerative disc disease)    Dr. Renae Fickle evaluated her and recommended pain mgmt MD.  She saw Dr. Oneal Grout for pain mgmt at one time.   Diabetes mellitus without complication (HCC)    Disequilibrium syndrome    MRI brain normal 2012   DIZZINESS 04/12/2010   Qualifier: Diagnosis of  By: Milinda Cave M.D., Elizebeth Brooking    Fibromyalgia    questionable  GERD (gastroesophageal reflux disease)    Heart murmur    functional, w/u done; PALPITATIONS IN THE PAST   History of bronchitis    History of kidney stones    History of palpitations    History of pulmonary embolism 05/2013   Right sided.  Setting: post-op percutaneous nephrolithotomy--xarelto x 6 mo   Hypertension    Hypothyroidism    Insomnia    Insulin resistance    Kidney stone on right side 12/2014   Dr. Annabell Howells considering PCNL, ESWL, and ureteroscopy as of 01/25/15     Microscopic hematuria 2014   CT abd pelv showed 1.8 cm right renal pelvis stone.  Also had UA c/w UTI so abx given.    Mixed incontinence urge and stress    and nocturia x 5-6 per night   Morbid obesity (HCC)    BMI 49.  Gastric stapling surgery in distant past.   Nephrolithiasis    Lithotripsy 2002.  Right percut nephrolith 05/2013.   Nocturia    Numbness and tingling    bilateral hands and feet   OAB (overactive bladder)    Per Dr. Annabell Howells: pt declined pelvic floor PT.  Vesicare trial started 01/25/15.   Obesity hypoventilation syndrome (HCC)    Peripheral neuropathy    No old records to confirm pt's report.  Pt refuses to have more NCS b/c test is painful.   Pneumonia    Shortness of breath    CHRONIC; 3 mo trial off of ACE-I made no difference.  Spirometry x 2 has shown no obstruction.  Allergy w/u NEG.   Shoulder pain    HX OF BILATERAL SHOULDER SURGERIES - PT HAS VERY LIMITED ROM - ESPECIALLY RAISING HER ARMS   Sleep apnea    cpap with o2 bleed in 2.5L   Spinal stenosis    with L5 radiculopathy, also pseudospondylolisthesis per Dr. Renae Fickle   Superficial thrombophlebitis of left leg 05/2013   with cellulitis--resolved approp with abx and ice   Urinary incontinence    Urinary urgency    Vertigo     Tobacco History: Social History   Tobacco Use  Smoking Status Former   Packs/day: 1.00   Years: 3.00   Additional pack years: 0.00   Total pack years: 3.00   Types: Cigarettes   Start date: 05/17/1976   Quit date: 03/31/1998   Years since quitting: 24.3  Smokeless Tobacco Never   Counseling given: Not Answered   Outpatient Medications Prior to Visit  Medication Sig Dispense Refill   acetaminophen (TYLENOL) 650 MG CR tablet Take 650 mg by mouth every 8 (eight) hours as needed for pain.     albuterol (PROVENTIL) (2.5 MG/3ML) 0.083% nebulizer solution USE 1 VIAL VIA NEBULIZER EVERY 6 HOURS AS NEEDED FOR WHEEZING OR SHORTNESS OF BREATH 75 mL 12   aspirin EC 81 MG tablet  Take 81 mg by mouth daily. Swallow whole.     furosemide (LASIX) 40 MG tablet Take 1 tablet (40 mg total) by mouth 2 (two) times daily. (Patient taking differently: Take 40 mg by mouth daily as needed for fluid.) 60 tablet 11   levothyroxine (SYNTHROID, LEVOTHROID) 200 MCG tablet TAKE ONE TABLET BY MOUTH ONE TIME DAILY before breakfast 30 tablet 11   loperamide (IMODIUM A-D) 2 MG tablet Take 2 mg by mouth daily.     metoprolol tartrate (LOPRESSOR) 50 MG tablet Take 1 tablet (50 mg total) by mouth 2 (two) times daily. 60 tablet 0   montelukast (SINGULAIR) 10  MG tablet Take 10 mg by mouth in the morning.     Multiple Vitamin (MULTIVITAMIN) capsule Take 1 capsule by mouth daily.     NON FORMULARY Pt uses a cpap nightly     NYSTATIN powder Apply 1 Application topically 2 (two) times daily as needed (irritation).     OXYGEN Inhale 2.5 L into the lungs continuous.     potassium chloride SA (KLOR-CON M) 20 MEQ tablet Take 2 tablets (40 mEq total) by mouth daily. 60 tablet 0   traMADol (ULTRAM) 50 MG tablet Take 1 tablet (50 mg total) by mouth every 6 (six) hours as needed for moderate pain. 12 tablet 0   Vitamin D, Ergocalciferol, (DRISDOL) 1.25 MG (50000 UNIT) CAPS capsule Take 50,000 Units by mouth every Monday.     lisinopril (ZESTRIL) 10 MG tablet Take 1 tablet (10 mg total) by mouth daily. 30 tablet 0   No facility-administered medications prior to visit.     Review of Systems:   Constitutional:   No  weight loss, night sweats,  Fevers, chills,  +fatigue, or  lassitude.  HEENT:   No headaches,  Difficulty swallowing,  Tooth/dental problems, or  Sore throat,                No sneezing, itching, ear ache, nasal congestion, post nasal drip,   CV:  No chest pain,  Orthopnea, PND, swelling in lower extremities, anasarca, dizziness, palpitations, syncope.   GI  No heartburn, indigestion, abdominal pain, nausea, vomiting, diarrhea, change in bowel habits, loss of appetite, bloody stools.    Resp:  No excess mucus, no productive cough,  No non-productive cough,  No coughing up of blood.  No change in color of mucus.  No wheezing.  No chest wall deformity  Skin: no rash or lesions.  GU: no dysuria, change in color of urine, no urgency or frequency.  No flank pain, no hematuria   MS:  No joint pain or swelling.  No decreased range of motion.  No back pain.    Physical Exam  BP 130/78 (BP Location: Left Wrist, Patient Position: Sitting, Cuff Size: Large)   Pulse (!) 107   Temp 98.3 F (36.8 C) (Oral)   Ht 5' 4.5" (1.638 m)   Wt (!) 372 lb 6.4 oz (168.9 kg)   SpO2 97%   BMI 62.94 kg/m   GEN: A/Ox3; pleasant , NAD, well nourished , BMI 62 , wc    HEENT:  [place]/AT,  NOSE-clear, THROAT-clear, no lesions, no postnasal drip or exudate noted.  Class III MP airway  NECK:  Supple w/ fair ROM; no JVD; normal carotid impulses w/o bruits; no thyromegaly or nodules palpated; no lymphadenopathy.    RESP  Clear  P & A; w/o, wheezes/ rales/ or rhonchi. no accessory muscle use, no dullness to percussion  CARD:  RRR, no m/r/g, 1+ peripheral edema, pulses intact, no cyanosis or clubbing.  GI:   Soft & nt; nml bowel sounds; no organomegaly or masses detected.   Musco: Warm bil, no deformities or joint swelling noted.   Neuro: alert, no focal deficits noted.    Skin: Warm, no lesions or rashes    Lab Results:  CBC    BNP  Imaging: No results found.       Latest Ref Rng & Units 12/12/2013    2:52 PM  PFT Results  FVC-Pre L 2.37   FVC-Predicted Pre % 76   FVC-Post L 2.28   FVC-Predicted Post %  74   Pre FEV1/FVC % % 82   Post FEV1/FCV % % 85   FEV1-Pre L 1.94   FEV1-Predicted Pre % 81   FEV1-Post L 1.95   DLCO uncorrected ml/min/mmHg 23.99   DLCO UNC% % 111   DLVA Predicted % 140   TLC L 4.84   TLC % Predicted % 101   RV % Predicted % 117     No results found for: "NITRICOXIDE"      Assessment & Plan:   Chronic respiratory failure with  hypercapnia (HCC) Chronic hypoxic and hypercarbic respiratory failure secondary to underlying obstructive sleep apnea and OHS.  Continue on oxygen to keep O2 saturations greater than 88 to 90% and on nocturnal vent support with trilogy device and O2  Trilogy download requested POC ordered  Plan  Patient Instructions  Chronic hypercarbic and Hypoxic Respiratory failure secondary to Sleep apnea and Hypoventilation syndrome  --Continue Trilogy ventilator nightly with Oxygen 2l/m  --Oxygen 2L continuous or 2L pulsed on POC with exertion -- Trilogy download .   Asthma --Albuterol inhaler As needed   --Continue Singulair 10 mg daily --Flonase nasal As needed    Follow-up with Dr. Everardo All in 6 months and As needed      Obesity hypoventilation syndrome (HCC) OSA/OHS-continue on nocturnal trilogy with oxygen.  Trilogy download requested     Rubye Oaks, NP 08/05/2022

## 2022-08-07 NOTE — Telephone Encounter (Signed)
Need her download

## 2022-08-07 NOTE — Telephone Encounter (Signed)
I have contacted [employer] twice with week (Monday and Wednesday), unable to reach someone to talk to.  Left message x2.  First message left patient information and that we need a download faxed to the office with the fax #.  Second attempt, left message to return call.  No fax received and call has not been returned.  Called for the 3rd time today to request download, spoke with [place].  I was advised that they will fax over the download.  Will await download.  Download obtained from [employer].  Placed in review folder for Tammy to review.

## 2022-08-11 NOTE — Telephone Encounter (Signed)
Trilogy Evo noninvasive vent shows 100% compliance/usage.  Daily average usage at 6 hours  Continue to wear trilogy vent at bedtime each night.

## 2022-08-13 ENCOUNTER — Encounter: Payer: Self-pay | Admitting: *Deleted

## 2022-08-13 ENCOUNTER — Ambulatory Visit (HOSPITAL_BASED_OUTPATIENT_CLINIC_OR_DEPARTMENT_OTHER): Payer: Medicare Other | Admitting: Pulmonary Disease

## 2022-08-13 NOTE — Telephone Encounter (Signed)
ATC patient x1.  LVM to return call. 

## 2022-08-13 NOTE — Telephone Encounter (Signed)
PT ret Heathers call and would like a CB because she asked me if that meant she should" just go pick it up." And I did not know what to tell her. Thanks and I'm sorry I did not know.   Her # is [phone]

## 2022-08-14 ENCOUNTER — Other Ambulatory Visit: Payer: Self-pay | Admitting: *Deleted

## 2022-08-14 DIAGNOSIS — J9612 Chronic respiratory failure with hypercapnia: Secondary | ICD-10-CM

## 2022-08-14 NOTE — Telephone Encounter (Signed)
ATC the patient. LVM for the patient to return my call. 

## 2022-08-20 ENCOUNTER — Encounter: Payer: Self-pay | Admitting: *Deleted

## 2022-08-20 NOTE — Telephone Encounter (Signed)
[facility] (supporting You)Just now (3:49 PM)    Ok I got. The message

## 2022-08-20 NOTE — Telephone Encounter (Signed)
ATC x2.  LVM to return call.  Advised to call the office, also advised I would send her a mychart message as well.

## 2022-08-20 NOTE — Telephone Encounter (Signed)
Patient aware. Nothing further needed. 

## 2022-10-01 ENCOUNTER — Ambulatory Visit: Payer: Medicare Other | Admitting: Family Medicine

## 2022-10-23 ENCOUNTER — Other Ambulatory Visit: Payer: Self-pay | Admitting: Nurse Practitioner

## 2022-10-23 DIAGNOSIS — Z1231 Encounter for screening mammogram for malignant neoplasm of breast: Secondary | ICD-10-CM

## 2022-10-23 DIAGNOSIS — E2839 Other primary ovarian failure: Secondary | ICD-10-CM

## 2022-11-26 ENCOUNTER — Ambulatory Visit: Payer: Medicare Other

## 2023-01-27 ENCOUNTER — Other Ambulatory Visit (HOSPITAL_COMMUNITY): Payer: Self-pay

## 2023-04-23 ENCOUNTER — Inpatient Hospital Stay: Admission: RE | Admit: 2023-04-23 | Payer: Medicare Other | Source: Ambulatory Visit

## 2023-04-23 ENCOUNTER — Other Ambulatory Visit: Payer: Medicare Other

## 2023-06-08 ENCOUNTER — Encounter: Payer: Self-pay | Admitting: *Deleted

## 2023-06-10 ENCOUNTER — Ambulatory Visit (HOSPITAL_BASED_OUTPATIENT_CLINIC_OR_DEPARTMENT_OTHER): Payer: Medicare Other | Admitting: Pulmonary Disease

## 2023-06-10 ENCOUNTER — Encounter (HOSPITAL_BASED_OUTPATIENT_CLINIC_OR_DEPARTMENT_OTHER): Payer: Self-pay | Admitting: Pulmonary Disease

## 2023-06-10 VITALS — BP 118/76 | HR 100 | Ht 64.5 in | Wt 353.4 lb

## 2023-06-10 DIAGNOSIS — G4733 Obstructive sleep apnea (adult) (pediatric): Secondary | ICD-10-CM

## 2023-06-10 DIAGNOSIS — J45909 Unspecified asthma, uncomplicated: Secondary | ICD-10-CM | POA: Diagnosis not present

## 2023-06-10 DIAGNOSIS — Z87891 Personal history of nicotine dependence: Secondary | ICD-10-CM

## 2023-06-10 DIAGNOSIS — J9611 Chronic respiratory failure with hypoxia: Secondary | ICD-10-CM

## 2023-06-10 DIAGNOSIS — J9612 Chronic respiratory failure with hypercapnia: Secondary | ICD-10-CM | POA: Diagnosis not present

## 2023-06-10 NOTE — Progress Notes (Addendum)
 @Patient  ID: Tricia Perry, female    DOB: June 07, 1955, 68 y.o.   MRN: [id number]  Chief Complaint  Patient presents with   Follow-up    Chronic respiratory failure with hypercapnia   [name] is a 68 year old female former smoker (quit 25 years ago) with morbid obesity, chronic diastolic heart failure, chronic hypoxemic and hypercarbic respiratory failure who presents for follow-up.  She is compliant with Trilogy ventilator with 2.5 nightly for 6 hours nightly. Compliance report download in 08/11/2022 demonstrates 100% compliance. She uses a walker to ambulate. Has difficulty standing due to thoracic kyphosis. She recently had CT scan [facility] at [facility] on 05/21/23 which demonstrates RLL 4mm and healed right rib fracture. Last year she qualified for oxygen with ambulation. For her asthma she uses albuterol as needed every 3 months. Denies chronic wheezing or coughing. She did have influenza caught by her husband last month but this has resolved. Has shortness of breath with exertion.   Past Medical History:  Diagnosis Date   Arthritis    Knees; right-TKR, left-bone on bone/end stage   Asthma    Carpal tunnel syndrome on both sides 06/21/2010   Carpal tunnel syndrome, bilateral    Chronic nonallergic rhinitis    allergy eval NEG 10/2014: azelastine and flonase spray rx'd   Chronic venous insufficiency    with edema  and extensive varicose dz: Dr. Consuela Mimes is managing this, planning laser procedures.; A LOT OF LEG CRAMPS AND LEG STIFFNESS   Complication of anesthesia    STATES SHE WAS TOLD SHE WOKE UP DURING HER KNEE REPLACEMENT SURGERY AND HAD TO BE GIVEN MORE MEDICINE - NOT SURE IF SPINAL OR GENERAL ANESTHESIA   DDD (degenerative disc disease)    Dr. Renae Fickle evaluated her and recommended pain mgmt MD.  She saw Dr. Oneal Grout for pain mgmt at one time.   Diabetes mellitus without complication (HCC)    Disequilibrium syndrome    MRI brain normal 2012   DIZZINESS 04/12/2010    Qualifier: Diagnosis of  By: Milinda Cave M.D., Elizebeth Brooking    Fibromyalgia    questionable   GERD (gastroesophageal reflux disease)    Heart murmur    functional, w/u done; PALPITATIONS IN THE PAST   History of bronchitis    History of kidney stones    History of palpitations    History of pulmonary embolism 05/2013   Right sided.  Setting: post-op percutaneous nephrolithotomy--xarelto x 6 mo   Hypertension    Hypothyroidism    Insomnia    Insulin resistance    Kidney stone on right side 12/2014   Dr. Annabell Howells considering PCNL, ESWL, and ureteroscopy as of 01/25/15    Microscopic hematuria 2014   CT abd pelv showed 1.8 cm right renal pelvis stone.  Also had UA c/w UTI so abx given.    Mixed incontinence urge and stress    and nocturia x 5-6 per night   Morbid obesity (HCC)    BMI 49.  Gastric stapling surgery in distant past.   Nephrolithiasis    Lithotripsy 2002.  Right percut nephrolith 05/2013.   Nocturia    Numbness and tingling    bilateral hands and feet   OAB (overactive bladder)    Per Dr. Annabell Howells: pt declined pelvic floor PT.  Vesicare trial started 01/25/15.   Obesity hypoventilation syndrome (HCC)    Peripheral neuropathy    No old records to confirm pt's report.  Pt refuses to have more NCS b/c  test is painful.   Pneumonia    Shortness of breath    CHRONIC; 3 mo trial off of ACE-I made no difference.  Spirometry x 2 has shown no obstruction.  Allergy w/u NEG.   Shoulder pain    HX OF BILATERAL SHOULDER SURGERIES - PT HAS VERY LIMITED ROM - ESPECIALLY RAISING HER ARMS   Sleep apnea    cpap with o2 bleed in 2.5L   Spinal stenosis    with L5 radiculopathy, also pseudospondylolisthesis per Dr. Renae Fickle   Superficial thrombophlebitis of left leg 05/2013   with cellulitis--resolved approp with abx and ice   Urinary incontinence    Urinary urgency    Vertigo     Outpatient Medications Prior to Visit  Medication Sig Dispense Refill   albuterol (PROVENTIL) (2.5 MG/3ML) 0.083%  nebulizer solution USE 1 VIAL VIA NEBULIZER EVERY 6 HOURS AS NEEDED FOR WHEEZING OR SHORTNESS OF BREATH 75 mL 12   aspirin EC 81 MG tablet Take 81 mg by mouth daily. Swallow whole.     levothyroxine (SYNTHROID, LEVOTHROID) 200 MCG tablet TAKE ONE TABLET BY MOUTH ONE TIME DAILY before breakfast 30 tablet 11   lisinopril (ZESTRIL) 20 MG tablet Take 20 mg by mouth daily.     loperamide (IMODIUM A-D) 2 MG tablet Take 2 mg by mouth daily.     metoprolol tartrate (LOPRESSOR) 50 MG tablet Take 1 tablet (50 mg total) by mouth 2 (two) times daily. 60 tablet 0   montelukast (SINGULAIR) 10 MG tablet Take 10 mg by mouth in the morning.     NON FORMULARY Pt uses a cpap nightly     OXYGEN Inhale 2.5 L into the lungs continuous.     OZEMPIC, 1 MG/DOSE, 4 MG/3ML SOPN Inject 1 mg into the skin once a week.     potassium chloride SA (KLOR-CON M) 20 MEQ tablet Take 2 tablets (40 mEq total) by mouth daily. 60 tablet 0   acetaminophen (TYLENOL) 650 MG CR tablet Take 650 mg by mouth every 8 (eight) hours as needed for pain. (Patient not taking: Reported on 06/10/2023)     furosemide (LASIX) 40 MG tablet Take 1 tablet (40 mg total) by mouth 2 (two) times daily. (Patient taking differently: Take 40 mg by mouth daily as needed for fluid.) 60 tablet 11   lisinopril (ZESTRIL) 10 MG tablet Take 1 tablet (10 mg total) by mouth daily. 30 tablet 0   Multiple Vitamin (MULTIVITAMIN) capsule Take 1 capsule by mouth daily. (Patient not taking: Reported on 06/10/2023)     NYSTATIN powder Apply 1 Application topically 2 (two) times daily as needed (irritation). (Patient not taking: Reported on 06/10/2023)     oxybutynin (DITROPAN) 5 MG tablet Take 5 mg by mouth 3 (three) times daily.     Vitamin D, Ergocalciferol, (DRISDOL) 1.25 MG (50000 UNIT) CAPS capsule Take 50,000 Units by mouth every Monday. (Patient not taking: Reported on 06/10/2023)     No facility-administered medications prior to visit.    Review of Systems  Review of  Systems  Constitutional:  Negative for chills, diaphoresis, fever, malaise/fatigue and weight loss.  HENT:  Negative for congestion.   Respiratory:  Positive for shortness of breath. Negative for cough, hemoptysis, sputum production and wheezing.   Cardiovascular:  Negative for chest pain, palpitations and leg swelling.     Physical Exam  BP 118/76   Pulse 100   Ht 5' 4.5" (1.638 m)   Wt (!) 353 lb 6 oz (160.3  kg)   SpO2 95%   BMI 59.72 kg/m   Body mass index is 59.72 kg/m.  Physical Exam: General: Morbidly obese, chronically ill-appearing, no acute distress HENT: [place], AT, OP clear, MMM Eyes: EOMI, no scleral icterus Respiratory: Clear to auscultation bilaterally.  No crackles, wheezing or rales Cardiovascular: RRR, -M/R/G, no JVD Extremities:Lymphedema,-tenderness Neuro: AAO x4, CNII-XII grossly intact Skin: Intact, no rashes or bruising Psych: Normal mood, normal affect  Lab Results:  CBC    Component Value Date/Time   WBC 7.3 05/22/2022 1503   RBC 4.48 05/22/2022 1503   HGB 13.5 05/22/2022 1503   HCT 44.2 05/22/2022 1503   PLT 287 05/22/2022 1503   MCV 98.7 05/22/2022 1503   MCH 30.1 05/22/2022 1503   MCHC 30.5 05/22/2022 1503   RDW 14.5 05/22/2022 1503   LYMPHSABS 2.1 04/17/2022 0259   MONOABS 1.0 04/17/2022 0259   EOSABS 0.5 04/17/2022 0259   BASOSABS 0.1 04/17/2022 0259    BMET    Component Value Date/Time   NA 139 05/22/2022 1503   K 4.1 05/22/2022 1503   CL 101 05/22/2022 1503   CO2 30 05/22/2022 1503   GLUCOSE 111 (H) 05/22/2022 1503   BUN 15 05/22/2022 1503   CREATININE 0.55 05/22/2022 1503   CREATININE 0.70 12/19/2013 1343   CALCIUM 9.5 05/22/2022 1503   GFRNONAA >60 05/22/2022 1503   GFRNONAA >60 06/21/2010 0000   GFRAA >60 03/16/2019 2104   GFRAA >60 06/21/2010 0000   Addendum 06/19/23: Vent compliance report 05/13/23-06/12/23 Total days: 31 Days with compliance: 25 (81%) AHI 0.24  Assessment & Plan:   68 year old female with morbid  obesity (BMI 59), chronic respiratory failure secondary to OSA/OHS who presents for follow-up. Reports compliance with ventilator with nocturnal oxygen. Ambulatory O2 with no desaturations. Will order ONO to determine if nightly O2 still required.   Chronic hypoxemic and  hypercarbic respiratory failure secondary to OSA/OHS --Ambulatory O2 today with no desaturations --Goal SpO2 >88% --Continue Trilogy ventilator nightly with nightly 2.5L --Request Trilogy download to review --ORDER ONO on Trilogy on room air.  >If normal then can cancel oxygen  Asthma - well controlled --PRN albuterol --Continue singulair 10 mg daily  RLL lung nodule 4 mm, likely benign --No further follow-up indicated   Immunization History  Administered Date(s) Administered   Influenza Split 03/31/2008, 05/10/2010, 12/30/2010   Influenza Whole 05/10/2010   Influenza,inj,Quad PF,6+ Mos 05/18/2014, 01/14/2016, 12/03/2016   Influenza,inj,Quad PF,6-35 Mos 01/14/2016, 12/03/2016   Influenza-Unspecified 11/29/2016   Moderna SARS-COV2 Booster Vaccination 03/27/2020   Moderna Sars-Covid-2 Vaccination 05/13/2019, 06/10/2019   Pneumococcal Polysaccharide-23 10/28/2012   Td 03/31/2001   Td (Adult),5 Lf Tetanus Toxid, Preservative Free 03/31/2001   Tdap 04/01/2003, 10/28/2012   No orders of the defined types were placed in this encounter.  Orders Placed This Encounter  Procedures   Pulse oximetry, overnight    On ventilator on room air.    Standing Status:   Future    Expiration Date:   06/09/2024   Return in about 6 months (around 12/11/2023).  I have spent a total time of 36-minutes on the day of the appointment including chart review, data review, collecting history, coordinating care and discussing medical diagnosis and plan with the patient/family. Past medical history, allergies, medications were reviewed. Pertinent imaging, labs and tests included in this note have been reviewed and interpreted independently by  me  Mechele Collin, MD [facility] Pulmonary/Critical Care Medicine 06/10/2023 4:47 PM

## 2023-06-10 NOTE — Patient Instructions (Signed)
  Chronic hypoxemic and  hypercarbic respiratory failure secondary to OSA/OHS --Ambulatory O2 today with no desaturations --Goal SpO2 >88% --Continue Trilogy ventilator nightly with nightly 2.5L --Request Trilogy download to review --ORDER ONO on Trilogy on room air.  >If normal then can cancel oxygen  Asthma --PRN albuterol --Continue singulair 10 mg daily  RLL lung nodule 4 mm, likely benign --No further follow-up indicated

## 2023-06-19 ENCOUNTER — Telehealth: Payer: Self-pay | Admitting: Pulmonary Disease

## 2023-06-19 NOTE — Telephone Encounter (Signed)
 PT calling concerned she has not heard from the [employer], [employer]. [facility] did order. PT's # is [phone]

## 2023-06-25 NOTE — Telephone Encounter (Signed)
 NFN

## 2023-08-27 ENCOUNTER — Telehealth: Payer: Self-pay | Admitting: Adult Health

## 2023-08-27 NOTE — Telephone Encounter (Signed)
 Cmn received from [facility] for O2 concentrator.

## 2023-09-07 ENCOUNTER — Telehealth: Payer: Self-pay | Admitting: Adult Health

## 2023-09-07 NOTE — Telephone Encounter (Signed)
 CMN received on 60/09/25 for ventilator

## 2023-09-07 NOTE — Telephone Encounter (Signed)
 Duplicate CMN received on 09/07/23

## 2023-09-16 NOTE — Telephone Encounter (Signed)
 Rc'd signed copy of CMN from [place]. Will fax to [phone]. Once confirmed will attach and send to scan.

## 2023-09-17 NOTE — Telephone Encounter (Signed)
 Ordered through [employer], sent back to [place] to go through [employer]

## 2023-10-05 ENCOUNTER — Telehealth (HOSPITAL_BASED_OUTPATIENT_CLINIC_OR_DEPARTMENT_OTHER): Payer: Self-pay

## 2023-10-05 NOTE — Telephone Encounter (Signed)
 Copied from CRM [id number]. Topic: Referral - Prior Authorization Question >> Oct 05, 2023  9:51 AM Rilla NOVAK wrote: Tricia Perry from [place], Patient's insurance denied non invasive ventilator. With the notes that was sent from 06/10/23 visit, is provider able to make an addendum to the notes?  Insurance is requesting the following: 1. Records must show breathing care cannot be given using a different machine. 2.  Records must show that a bipap machine with certain features, ex back up rate or title volume will not work. 3.  Provider must give exact reason a vent is needed for care instead of bipap ex... special features back up rate, etc.   Megan:  [phone].

## 2023-10-06 NOTE — Telephone Encounter (Signed)
 She is already on Trilogy Vent. If needs a visit please set up office visit or with APP for Sept to discuss further.   If needs documentation sooner will need to add ov with APP or Dr. Jude

## 2023-10-09 ENCOUNTER — Telehealth: Payer: Self-pay

## 2023-10-09 NOTE — Telephone Encounter (Signed)
 Copied from CRM [id number]. Topic: Clinical - Medication Question >> Oct 09, 2023 10:19 AM Rozanna MATSU wrote: Reason for CRM: PT ALONG WITH [employer] IS CALLING ABOUT THE VENT STATED THEY ARE NEEDING THE PATIENTS MEDICAL RECORDS AND WHY A VENT IS NEEDED VERSES THE CPA. IF SOMEONE COULD CONTACT PT ABOUT THIS SHE SEEMS VERY CONFUSED       Spoke to patient and told her I will look into it or direct it to someone who knows what is happening.   NFN

## 2023-10-15 NOTE — Telephone Encounter (Signed)
 I called and spoke to pt. I informed pt that insurance wants her to come in for an ov to state whether or not she needs the NIV. Pt states she would have to call back and let us  know if she decides to come in or not because it's hard for her to come here sometimes due to transportation. NFN

## 2023-10-18 NOTE — Telephone Encounter (Signed)
 OV 11/02/23 made with Charley PA

## 2023-11-02 ENCOUNTER — Ambulatory Visit (HOSPITAL_BASED_OUTPATIENT_CLINIC_OR_DEPARTMENT_OTHER)

## 2023-11-04 NOTE — Telephone Encounter (Signed)
 I have tried my best to find out what [facility] was needing and where documents should be faxed. I have spoke with 2 different departments and no one with [facility] knows what I am talking about. I have called Mrs. Reaume and let her know where we stand. I explained unless she has the letters requesting the information and who is requesting I can't do much. I also told her since she has Dx of OSA they may want her to be on CPAP versus NIV. [employer] makes you jump through a lot of hoops to get the approval for NIV.  The patient will try to find the letters and call the office back. I found out that Jodeane Evan, [facility] are involved

## 2023-11-06 ENCOUNTER — Telehealth (HOSPITAL_BASED_OUTPATIENT_CLINIC_OR_DEPARTMENT_OTHER): Payer: Self-pay

## 2023-11-06 DIAGNOSIS — E662 Morbid (severe) obesity with alveolar hypoventilation: Secondary | ICD-10-CM

## 2023-11-06 DIAGNOSIS — J9612 Chronic respiratory failure with hypercapnia: Secondary | ICD-10-CM

## 2023-11-06 NOTE — Telephone Encounter (Signed)
 I have placed order to continue Trilogy ventilator nightly with nightly 2.5L   Called the pt and notified needs appt  She states she needs to call back later to schedule  She is aware of the importance of appt and says will call back to set it up when she is able  Nothing further needed

## 2023-11-06 NOTE — Telephone Encounter (Signed)
 OV with Dr. Kassie 06/10/23 on Trilogy vent - Can send order for Trilogy vent same setting .  Was suppose to have ov in ~Sept with Kassie for follow up .  If needs ov in order for new vent order please set up next month as planned.

## 2023-11-06 NOTE — Telephone Encounter (Signed)
 Copied from CRM [id number]. Topic: Clinical - Order For Equipment >> Nov 05, 2023  2:42 PM Rozanna MATSU wrote: Reason for CRM: [facility] WITH [facility] NEEDS NEW PRESCRIPTION FOR VENT [phone] NEEDING THIS TO GET PT SUPPLIES.

## 2023-11-16 ENCOUNTER — Telehealth: Payer: Self-pay

## 2023-11-16 NOTE — Telephone Encounter (Signed)
 Copied from CRM [id number]. Topic: Clinical - Medical Advice >> Nov 13, 2023  3:23 PM Rozanna MATSU wrote: Reason for CRM: PT CALLING STATED SHE REC'D A LETTER FROM HER INSURANCE COMPANY STATING THEY NEED MORE INFORMATION IN REGARDS TO CPAP AND/OR VENT. STATED SHE REC'D THE LETTER IN JUNE AND STATED [employer] CALLED HER THIS MORNING ABOUT THE BILL. THE PT WOULD LIKE A CALL BACK .SHE STATED HER INSURANCE STATED SHE IS COVERED.

## 2023-11-16 NOTE — Telephone Encounter (Signed)
 Attempted to contact Mrs. Pagliarulo and left a detailed message requesting that she return the call to provide additional information regarding VENT. Additionally, I spoke with Mitch from [employer], who confirmed that the DME order was sent in July. An update will be provided once all necessary information has been obtained.

## 2023-11-23 NOTE — Telephone Encounter (Signed)
 Hello, this is an update from Mitch at [employer]:I know that we talked about a lot for Tricia Perry so below you will find a summary of what is needed for Tricia Perry to stand the best chance at an approval for NIV with [employer]. I checked her chart once more before sending this over and we will be going the OHS route, not the COPD route like we were talking about. Below is what is needed for her best chance at an approval with [employer].  Qualifying PFT (the last one that I see on her chart is from 40, insurance will need one that has been completed in the last two years) Progress note speaking to the nature of her OHS and that OSA is not a contributing factor to her chronic respiratory failure  Rule out/risk of harm statement which I can type up to be placed in the MD progress note (we have had some success recently with [facility] approving NIV with a certain statement. I would send that over to you) Signed Order (I would type this up for you)  I will send this to Dr. Kassie to order what is needed for Tricia Perry to receive her CPAP.

## 2023-11-25 NOTE — Telephone Encounter (Signed)
 I will call Mitch to provide the next scheduled office day for Dr. Kassie, as well as contact number, so he can reach out directly to Dr. Kassie to discuss all necessary details regarding  Mrs. Simer's office visit for CPAP. Please let me know if anything further is needed at this time.

## 2023-11-25 NOTE — Telephone Encounter (Signed)
 Not sure on times but she's not back in clinic until 12/10/2023

## 2023-11-26 NOTE — Telephone Encounter (Addendum)
 Called Mitch from [employer]. I provided him with the update. He said he is available Tuesday, 9/2, or Wednesday, 9/3 anytime to call is fine. He can be reached at [phone]. Please advise if anything else is needed. Thank you.

## 2023-12-02 NOTE — Progress Notes (Signed)
 Date of service: 11/25/2023  SURGEON:    Alm DELENA Lower, MD        PREOPERATIVE DIAGNOSIS: Lumbar facet arthropathy chronic; lumbar spondylosis without radiculopathy chronic  POSTOPERATIVE DIAGNOSIS: Lumbar facet arthropathy chronic; lumbar spondylosis without radiculopathy chronic  PROCEDURE: LUMBAR FACET JOINT INJECTION UTILIZING THE MEDIAL BRANCH TECHNIQUE WITH FLUOROSCOPY  ANESTHESIA: Local  LEVELS TREATED:   Bilateral L4-5 and L5-S1  with needles at L3, L4 and sacral ala L5  PREPROCEDURE PAIN SCORE: 8/10 on VAS  IMMEDIATE POST PROCEDURE PAIN SCORE: 0/10 on VAS  BLOOD LOSS: minimal  COMPLICATIONS: none  THERAPEUTIC AGENT: 0.5cc 2% Lidocaine  per nerve  DESCRIPTION:  This patient has had moderate to severe neck/low back pain that is predominately axial.  This pain has caused functional deficits and results in pain which is >/= to 4/10 on the VAS scale.  The pain has been present for >6 weeks and has not responded to conservative therapy including PT, home exercise program, medication management, rest and passage or time.  After review of the applicable imaging and physical exam we deem the current pain to be facet joint mediated.  Therefore, we will proceed with facet joint injections utilizing the medial branch technique.  If Javen Keonna Raether finds benefit of >80% with two sets of medial branch blocks, we will plan to proceed with radiofrequency ablation of the previously treated nerves.    The patient gave informed consent. The patient was taken to the procedure room, placed in the prone position and back was prepped with antiseptic solution and draped in the usual fashion. Using sterile technique with fluoroscopic guidance the junction of the superior articulating process and transverse process was obtained, a sterile styletted spinal needle was advanced into the region, adjacent to the facet joint.  Once needle was appropriately positioned, aspiration was negative for heme or air.  Therapeutic agent was administered in increments. Procedure was then repeated in identical manner listed above with separate needles at all treated levels.  All needles were removed intact.   The patient tolerated the procedure well and recovered uneventfully in the pain center recovery room. The procedure was performed as stated above. Discharge instructions were given.   ADDENDUM: Fluoroscopy time and Exposure, mRad were recorded and scanned into chart for procedure. Image count stored.

## 2023-12-08 NOTE — Telephone Encounter (Signed)
 Pt notified and she will try to find this study she also states she is not coming to see you as her insurance told her they will only pay for once visit per year and she has already been in this month

## 2023-12-08 NOTE — Telephone Encounter (Signed)
 She was last seen in March. If she needs a future sleep study she needs to have a face to face visit within 30 days. Typically our NIV patients are seen twice a year. Please have her contact her insurance to re-verify because the conditions she states are unusual.

## 2023-12-09 ENCOUNTER — Encounter (HOSPITAL_BASED_OUTPATIENT_CLINIC_OR_DEPARTMENT_OTHER): Payer: Self-pay

## 2023-12-09 NOTE — Telephone Encounter (Signed)
 Pt notified and she states If she gets time shell call them. I did stress that pt will not be allowed to continue use of ventilator without requirements from insurance.

## 2023-12-24 ENCOUNTER — Telehealth: Payer: Self-pay | Admitting: *Deleted

## 2023-12-24 NOTE — Telephone Encounter (Signed)
 Received CMN from [facility] regarding NIV.  Placed in CMN folder for signature.

## 2024-01-14 NOTE — Telephone Encounter (Signed)
 Pt got a call from [facility] concerning her ventilator.  Pt would appreciate someone calling her back so she can get this straightened out.  She was told by tech w/ [employer], he cannot come out and maintenance her machine until they get her account straightened out.

## 2024-01-15 NOTE — Telephone Encounter (Signed)
 CMN was signed and faxed to [facility] on 12/24/2023, received confirmation fax that it was sent successfully.    Called and spoke with [place].  She states that the CMN received was not signed.  I explained that I have the CMN and it is signed and dated 12/24/2023.  She asked that it be faxed again to [phone] along with the last OV note and the order.  She wanted to know the prescribing provider.  I told her Tammy Parrett NP.  I spelled the last name for her as well as she asked me several times.  I explained that the CMN that was faxed to us  had Tammy Parrett NP on the form to be signed.  Advised I would fax the CMN signed from 12/24/2023 again along with the last OV note and order for the NIV.    CMN signed/dated 12/24/2023, last OV note and order for NIV faxed to [phone].  Received confirmation fax that it was sent successfully.  ATC patient x1.  LVM to return call regarding her message from yesterday concerning the NIV (ventilator).  Also advised I would send her a mychart message as well.

## 2024-02-18 ENCOUNTER — Encounter
